# Patient Record
Sex: Male | Born: 1945 | Race: White | Hispanic: No | Marital: Married | Smoking: Former smoker
Health system: Southern US, Community
[De-identification: ages and names within clinical notes are randomized; demographics above are authoritative.]

## PROBLEM LIST (undated history)

## (undated) DIAGNOSIS — H9190 Unspecified hearing loss, unspecified ear: Secondary | ICD-10-CM

## (undated) DIAGNOSIS — M1990 Unspecified osteoarthritis, unspecified site: Secondary | ICD-10-CM

## (undated) DIAGNOSIS — I10 Essential (primary) hypertension: Secondary | ICD-10-CM

## (undated) DIAGNOSIS — E7800 Pure hypercholesterolemia, unspecified: Secondary | ICD-10-CM

## (undated) DIAGNOSIS — J449 Chronic obstructive pulmonary disease, unspecified: Secondary | ICD-10-CM

## (undated) DIAGNOSIS — I25110 Atherosclerotic heart disease of native coronary artery with unstable angina pectoris: Secondary | ICD-10-CM

## (undated) DIAGNOSIS — I4819 Other persistent atrial fibrillation: Secondary | ICD-10-CM

## (undated) DIAGNOSIS — I495 Sick sinus syndrome: Secondary | ICD-10-CM

## (undated) DIAGNOSIS — S42309A Unspecified fracture of shaft of humerus, unspecified arm, initial encounter for closed fracture: Secondary | ICD-10-CM

## (undated) DIAGNOSIS — I509 Heart failure, unspecified: Secondary | ICD-10-CM

## (undated) DIAGNOSIS — I2510 Atherosclerotic heart disease of native coronary artery without angina pectoris: Secondary | ICD-10-CM

## (undated) HISTORY — PX: TONSILLECTOMY: SUR1361

## (undated) HISTORY — DX: Other persistent atrial fibrillation: I48.19

## (undated) HISTORY — DX: Essential (primary) hypertension: I10

## (undated) HISTORY — DX: Chronic obstructive pulmonary disease, unspecified: J44.9

## (undated) HISTORY — PX: SKIN GRAFT: SHX250

## (undated) HISTORY — PX: FRACTURE SURGERY: SHX138

---

## 2013-03-14 ENCOUNTER — Encounter (HOSPITAL_COMMUNITY): Payer: Self-pay | Admitting: Emergency Medicine

## 2013-03-14 ENCOUNTER — Emergency Department (HOSPITAL_COMMUNITY): Payer: Medicare Other

## 2013-03-14 ENCOUNTER — Inpatient Hospital Stay (HOSPITAL_COMMUNITY)
Admission: EM | Admit: 2013-03-14 | Discharge: 2013-03-18 | DRG: 481 | Disposition: A | Discharge status: Skilled Nursing Facility | Payer: Medicare Other | Attending: Family Medicine | Admitting: Family Medicine

## 2013-03-14 DIAGNOSIS — S72143A Displaced intertrochanteric fracture of unspecified femur, initial encounter for closed fracture: Secondary | ICD-10-CM | POA: Insufficient documentation

## 2013-03-14 DIAGNOSIS — E7800 Pure hypercholesterolemia, unspecified: Secondary | ICD-10-CM | POA: Diagnosis present

## 2013-03-14 DIAGNOSIS — D62 Acute posthemorrhagic anemia: Secondary | ICD-10-CM

## 2013-03-14 DIAGNOSIS — Y990 Civilian activity done for income or pay: Secondary | ICD-10-CM

## 2013-03-14 DIAGNOSIS — W108XXA Fall (on) (from) other stairs and steps, initial encounter: Secondary | ICD-10-CM | POA: Diagnosis present

## 2013-03-14 DIAGNOSIS — Z87891 Personal history of nicotine dependence: Secondary | ICD-10-CM

## 2013-03-14 DIAGNOSIS — S72141A Displaced intertrochanteric fracture of right femur, initial encounter for closed fracture: Secondary | ICD-10-CM

## 2013-03-14 DIAGNOSIS — R9431 Abnormal electrocardiogram [ECG] [EKG]: Secondary | ICD-10-CM | POA: Diagnosis present

## 2013-03-14 DIAGNOSIS — Y9289 Other specified places as the place of occurrence of the external cause: Secondary | ICD-10-CM

## 2013-03-14 HISTORY — DX: Pure hypercholesterolemia, unspecified: E78.00

## 2013-03-14 HISTORY — DX: Unspecified fracture of shaft of humerus, unspecified arm, initial encounter for closed fracture: S42.309A

## 2013-03-14 LAB — CBC WITH DIFFERENTIAL/PLATELET
Basophils Absolute: 0 10*3/uL (ref 0.0–0.1)
Lymphocytes Relative: 7 % — ABNORMAL LOW (ref 12–46)
Lymphs Abs: 1.1 10*3/uL (ref 0.7–4.0)
MCV: 88.6 fL (ref 78.0–100.0)
Monocytes Relative: 7 % (ref 3–12)
Platelets: 273 10*3/uL (ref 150–400)
RDW: 13.3 % (ref 11.5–15.5)
WBC: 16.1 10*3/uL — ABNORMAL HIGH (ref 4.0–10.5)

## 2013-03-14 LAB — COMPREHENSIVE METABOLIC PANEL
BUN: 15 mg/dL (ref 6–23)
Calcium: 8.8 mg/dL (ref 8.4–10.5)
Creatinine, Ser: 1.23 mg/dL (ref 0.50–1.35)
GFR calc Af Amer: 68 mL/min — ABNORMAL LOW (ref >90)
Glucose, Bld: 117 mg/dL — ABNORMAL HIGH (ref 70–99)
Sodium: 139 mEq/L (ref 135–145)
Total Protein: 6.2 g/dL (ref 6.0–8.3)

## 2013-03-14 LAB — URINALYSIS, ROUTINE W REFLEX MICROSCOPIC
Ketones, ur: NEGATIVE mg/dL
Protein, ur: >300 mg/dL — AB
Urobilinogen, UA: 0.2 mg/dL (ref 0.0–1.0)

## 2013-03-14 LAB — URINE MICROSCOPIC-ADD ON

## 2013-03-14 LAB — ABO/RH: ABO/RH(D): B POS

## 2013-03-14 LAB — SURGICAL PCR SCREEN: Staphylococcus aureus: NEGATIVE

## 2013-03-14 MED ORDER — CEFAZOLIN SODIUM-DEXTROSE 2-3 GM-% IV SOLR
2.0000 g | Freq: Once | INTRAVENOUS | Status: AC
  Administered 2013-03-15: 2 g via INTRAVENOUS
  Filled 2013-03-14: qty 50

## 2013-03-14 MED ORDER — HYDROMORPHONE HCL PF 1 MG/ML IJ SOLN
1.0000 mg | Freq: Once | INTRAMUSCULAR | Status: AC
  Administered 2013-03-14: 1 mg via INTRAVENOUS
  Filled 2013-03-14: qty 1

## 2013-03-14 MED ORDER — SODIUM CHLORIDE 0.9 % IV BOLUS (SEPSIS)
250.0000 mL | Freq: Once | INTRAVENOUS | Status: AC
  Administered 2013-03-14: 250 mL via INTRAVENOUS

## 2013-03-14 MED ORDER — HYDROMORPHONE HCL PF 1 MG/ML IJ SOLN: 1.0000 mg | INTRAMUSCULAR | Status: DC | PRN

## 2013-03-14 MED ORDER — ATORVASTATIN CALCIUM 10 MG PO TABS
20.0000 mg | ORAL_TABLET | Freq: Every day | ORAL | Status: DC
  Administered 2013-03-14 – 2013-03-17 (×4): 20 mg via ORAL
  Filled 2013-03-14 (×4): qty 2

## 2013-03-14 MED ORDER — FENTANYL CITRATE 0.05 MG/ML IJ SOLN
50.0000 ug | Freq: Once | INTRAMUSCULAR | Status: AC
  Administered 2013-03-14: 50 ug via INTRAVENOUS
  Filled 2013-03-14: qty 2

## 2013-03-14 MED ORDER — ONDANSETRON HCL 4 MG/2ML IJ SOLN
4.0000 mg | Freq: Once | INTRAMUSCULAR | Status: AC
  Administered 2013-03-14: 4 mg via INTRAVENOUS
  Filled 2013-03-14: qty 2

## 2013-03-14 MED ORDER — ONDANSETRON HCL 4 MG/2ML IJ SOLN: 4.0000 mg | Freq: Three times a day (TID) | INTRAMUSCULAR | Status: DC | PRN

## 2013-03-14 MED ORDER — SODIUM CHLORIDE 0.9 % IV SOLN
INTRAVENOUS | Status: DC
  Administered 2013-03-14: 16:00:00 via INTRAVENOUS

## 2013-03-14 MED ORDER — FENTANYL CITRATE 0.05 MG/ML IJ SOLN
50.0000 ug | Freq: Once | INTRAMUSCULAR | Status: AC
  Administered 2013-03-14: 50 ug via INTRAVENOUS
  Filled 2013-03-14 (×2): qty 2

## 2013-03-14 MED ORDER — POVIDONE-IODINE 10 % EX SOLN
Freq: Once | CUTANEOUS | Status: AC
  Administered 2013-03-14: 1 via TOPICAL
  Filled 2013-03-14: qty 118

## 2013-03-14 MED ORDER — SODIUM CHLORIDE 0.9 % IV SOLN: INTRAVENOUS | Status: DC

## 2013-03-14 MED ORDER — MORPHINE SULFATE 2 MG/ML IJ SOLN
2.0000 mg | INTRAMUSCULAR | Status: DC | PRN
  Administered 2013-03-14 – 2013-03-15 (×6): 2 mg via INTRAVENOUS
  Filled 2013-03-14 (×6): qty 1

## 2013-03-14 MED ORDER — ACETAMINOPHEN 325 MG PO TABS: 650.0000 mg | ORAL_TABLET | Freq: Four times a day (QID) | ORAL | Status: DC | PRN

## 2013-03-14 MED ORDER — HYDROCODONE-ACETAMINOPHEN 5-325 MG PO TABS
1.0000 | ORAL_TABLET | Freq: Four times a day (QID) | ORAL | Status: DC | PRN
  Administered 2013-03-14 – 2013-03-18 (×8): 2 via ORAL
  Filled 2013-03-14 (×8): qty 2

## 2013-03-14 NOTE — ED Provider Notes (Addendum)
History     CSN: [id number]  Arrival date & time 03/14/13  1338   First MD Initiated Contact with Patient 03/14/13 1410      Chief Complaint  Patient presents with  . Fall  . Hip Pain    (Consider location/radiation/quality/duration/timing/severity/associated sxs/prior treatment) Patient is a 67 y.o. male presenting with fall and hip pain. The history is provided by the patient and the spouse.  Fall Pertinent negatives include no fever, no abdominal pain, no nausea, no vomiting, no hematuria and no headaches.  Hip Pain Pertinent negatives include no chest pain, no abdominal pain, no headaches and no shortness of breath.   patient stumbled the stepping off some stairs missing the final step falling about 3 feet onto his right hip. Resulting in significant hip pain. Pain is 10 out of 10 made worse with any movement of the right leg. No other injuries denies his head does not complaining of neck or back pain. Left leg is fine. No syncope no chest pain.  Past Medical History  Diagnosis Date  . High cholesterol   . Broken arm     Past Surgical History  Procedure Laterality Date  . Tonsillectomy    . Skin graft      No family history on file.  History  Substance Use Topics  . Smoking status: Former Games developer  . Smokeless tobacco: Not on file  . Alcohol Use: Yes     Comment: occasional       Review of Systems  Constitutional: Negative for fever and chills.  HENT: Negative for neck pain.   Respiratory: Negative for cough and shortness of breath.   Cardiovascular: Negative for chest pain.  Gastrointestinal: Negative for nausea, vomiting and abdominal pain.  Genitourinary: Negative for hematuria.  Musculoskeletal: Negative for back pain.  Neurological: Negative for headaches.  Hematological: Does not bruise/bleed easily.  Psychiatric/Behavioral: Negative for confusion.    Allergies  Review of patient's allergies indicates no known allergies.  Home Medications    Current Outpatient Rx  Name  Route  Sig  Dispense  Refill  . atorvastatin (LIPITOR) 20 MG tablet   Oral   Take 20 mg by mouth daily.           BP 172/92  Pulse 73  Temp(Src) 98.3 F (36.8 C) (Oral)  Resp 20  Ht 5\' 8"  (1.727 m)  Wt 165 lb (74.844 kg)  BMI 25.09 kg/m2  SpO2 97%  Physical Exam  Nursing note and vitals reviewed. Constitutional: He is oriented to person, place, and time. He appears well-developed and well-nourished. No distress.  HENT:  Head: Normocephalic and atraumatic.  Mouth/Throat: Oropharynx is clear and moist.  Eyes: Conjunctivae and EOM are normal. Pupils are equal, round, and reactive to light.  Neck: Normal range of motion. Neck supple.  Cardiovascular: Normal rate, regular rhythm, normal heart sounds and intact distal pulses.   No murmur heard. Pulmonary/Chest: Effort normal and breath sounds normal.  Abdominal: Bowel sounds are normal. There is no tenderness.  Musculoskeletal: Normal range of motion. He exhibits tenderness. He exhibits no edema.  Tender around the right hip with deformity. Patient's cap refill to his right foot is 2 seconds. Pulses are intact. Patient has range of motion of toes. The foot is rotated laterally.  Neurological: He is alert and oriented to person, place, and time. No cranial nerve deficit. He exhibits normal muscle tone.  Skin: Skin is warm. No rash noted.    ED Course  Procedures (including critical  care time)  Labs Reviewed  CBC WITH DIFFERENTIAL - Abnormal; Notable for the following:    WBC 16.1 (*)    Neutrophils Relative % 86 (*)    Lymphocytes Relative 7 (*)    Neutro Abs 13.9 (*)    Monocytes Absolute 1.1 (*)    All other components within normal limits  URINALYSIS, ROUTINE W REFLEX MICROSCOPIC - Abnormal; Notable for the following:    Specific Gravity, Urine >1.030 (*)    Hgb urine dipstick MODERATE (*)    Protein, ur >300 (*)    All other components within normal limits  COMPREHENSIVE METABOLIC  PANEL - Abnormal; Notable for the following:    Glucose, Bld 117 (*)    GFR calc non Af Amer 59 (*)    GFR calc Af Amer 68 (*)    All other components within normal limits  URINE MICROSCOPIC-ADD ON - Abnormal; Notable for the following:    Squamous Epithelial / LPF FEW (*)    Bacteria, UA FEW (*)    Casts GRANULAR CAST (*)    All other components within normal limits   Dg Chest 1 View  03/14/2013   *RADIOLOGY REPORT*  Clinical Data: Fall.  Hip fracture  CHEST - 1 VIEW  Comparison: None.  Findings: The lungs are clear.  Negative for heart failure or pneumonia.  Negative for mass lesion.  No pleural effusion.  IMPRESSION: No acute abnormality.   Original Report Authenticated By: Janeece Riggers, M.D.   Dg Hip Complete Right  03/14/2013   *RADIOLOGY REPORT*  Clinical Data: Fall  RIGHT HIP - COMPLETE 2+ VIEW  Comparison: None.  Findings: Intertrochanteric fracture right femur with moderate angulation and comminution.  Right hip joint appears normal.  No other pelvic fractures.  There is arterial calcification in the femoral artery.  IMPRESSION: Angulated intertrochanteric fracture on the right.   Original Report Authenticated By: Janeece Riggers, M.D.   Results for orders placed during the hospital encounter of 03/14/13  CBC WITH DIFFERENTIAL      Result Value Range   WBC 16.1 (*) 4.0 - 10.5 K/uL   RBC 4.65  4.22 - 5.81 MIL/uL   Hemoglobin 14.6  13.0 - 17.0 g/dL   HCT 16.1  09.6 - 04.5 %   MCV 88.6  78.0 - 100.0 fL   MCH 31.4  26.0 - 34.0 pg   MCHC 35.4  30.0 - 36.0 g/dL   RDW 40.9  81.1 - 91.4 %   Platelets 273  150 - 400 K/uL   Neutrophils Relative % 86 (*) 43 - 77 %   Lymphocytes Relative 7 (*) 12 - 46 %   Monocytes Relative 7  3 - 12 %   Eosinophils Relative 0  0 - 5 %   Basophils Relative 0  0 - 1 %   Neutro Abs 13.9 (*) 1.7 - 7.7 K/uL   Lymphs Abs 1.1  0.7 - 4.0 K/uL   Monocytes Absolute 1.1 (*) 0.1 - 1.0 K/uL   Eosinophils Absolute 0.0  0.0 - 0.7 K/uL   Basophils Absolute 0.0  0.0 -  0.1 K/uL   WBC Morphology WHITE COUNT CONFIRMED ON SMEAR     Smear Review LARGE PLATELETS PRESENT    URINALYSIS, ROUTINE W REFLEX MICROSCOPIC      Result Value Range   Color, Urine YELLOW  YELLOW   APPearance CLEAR  CLEAR   Specific Gravity, Urine >1.030 (*) 1.005 - 1.030   pH 5.5  5.0 - 8.0  Glucose, UA NEGATIVE  NEGATIVE mg/dL   Hgb urine dipstick MODERATE (*) NEGATIVE   Bilirubin Urine NEGATIVE  NEGATIVE   Ketones, ur NEGATIVE  NEGATIVE mg/dL   Protein, ur >811 (*) NEGATIVE mg/dL   Urobilinogen, UA 0.2  0.0 - 1.0 mg/dL   Nitrite NEGATIVE  NEGATIVE   Leukocytes, UA NEGATIVE  NEGATIVE  COMPREHENSIVE METABOLIC PANEL      Result Value Range   Sodium 139  135 - 145 mEq/L   Potassium 3.7  3.5 - 5.1 mEq/L   Chloride 103  96 - 112 mEq/L   CO2 24  19 - 32 mEq/L   Glucose, Bld 117 (*) 70 - 99 mg/dL   BUN 15  6 - 23 mg/dL   Creatinine, Ser 9.14  0.50 - 1.35 mg/dL   Calcium 8.8  8.4 - 78.2 mg/dL   Total Protein 6.2  6.0 - 8.3 g/dL   Albumin 3.6  3.5 - 5.2 g/dL   AST 23  0 - 37 U/L   ALT 24  0 - 53 U/L   Alkaline Phosphatase 78  39 - 117 U/L   Total Bilirubin 0.7  0.3 - 1.2 mg/dL   GFR calc non Af Amer 59 (*) >90 mL/min   GFR calc Af Amer 68 (*) >90 mL/min  URINE MICROSCOPIC-ADD ON      Result Value Range   Squamous Epithelial / LPF FEW (*) RARE   WBC, UA 0-2  <3 WBC/hpf   RBC / HPF 7-10  <3 RBC/hpf   Bacteria, UA FEW (*) RARE   Casts GRANULAR CAST (*) NEGATIVE    Date: 03/14/2013  Rate: 70  Rhythm: normal sinus rhythm and premature atrial contractions (PAC)  QRS Axis: normal  Intervals: normal  ST/T Wave abnormalities: nonspecific T wave changes  Conduction Disutrbances:none  Narrative Interpretation:   Old EKG Reviewed: none available Today's EKG depicts some T-wave inversions inferiorly and laterally no significant ST-T segment changes. No old EKG for comparison.  1. Intertrochanteric fracture of right femur, closed, initial encounter       MDM    Patient with  closed right intertrochanteric hip fracture no other significant medical problems other than elevated cholesterol. Today's EKG does have some T-wave inversions out laterally and inferiorly but no evidence of any acute cardiac event. Patient is not having any chest pain. Discussed with [facility] they will admit orthopedic call to Dr. Hilda Lias is still pending. He will be the operating surgeon. Patient tripped and fell no syncopal event involved. No other injuries.        Shelda Jakes, MD 03/14/13 1707  Shelda Jakes, MD 03/14/13 [phone]

## 2013-03-14 NOTE — H&P (Signed)
History and Physical  Ricardo Garrett ZOX:[id number] DOB: Nov 11, 1945 DOA: 03/14/2013  Referring physician: Darlyn Chamber, MD PCP: Catalina Pizza, MD   Chief Complaint: fall  HPI:  67 year old man presented emergency department after a fall resulting in right hip pain. X-ray revealed right hip fracture.  History obtained from patient. He is very active and continues to work. 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 He had pain afterwards.  In the emergency department he was noted to be afebrile with stable vital signs. Screening laboratory workup was unremarkable. EKG was nondiagnostic. Medical admission was requested.  Review of Systems:  Negative for fever, visual changes, sore throat, rash, chest pain, SOB, dysuria, bleeding, n/v/abdominal pain.  Past Medical History  Diagnosis Date  . High cholesterol   . Broken arm     Past Surgical History  Procedure Laterality Date  . Tonsillectomy    . Skin graft      Social History:  reports that he has quit smoking. He does not have any smokeless tobacco history on file. He reports that  drinks alcohol. He reports that he uses illicit drugs (Marijuana).  No Known Allergies  No family history on file. Patient does not report any particular medical problems in the family.  Prior to Admission medications   Medication Sig Start Date End Date Taking? Authorizing Provider  atorvastatin (LIPITOR) 20 MG tablet Take 20 mg by mouth daily.   Yes Historical Provider, MD   Physical Exam: Filed Vitals:   03/14/13 1349 03/14/13 1549 03/14/13 1709 03/14/13 1711  BP: 168/100 172/92 135/83 135/83  Pulse: 70 73 87 87  Temp: 98.3 F (36.8 C)     TempSrc: Oral     Resp: 20 20 20 20   Height: 5\' 8"  (1.727 m)     Weight: 74.844 kg (165 lb)     SpO2: 100% 97% 97% 97%    General:  Examined in the emergency department. Appears calm and comfortable Eyes: Pupils,  irises, lids appear normal. ENT: grossly normal hearing, lips & tongue. Poor dentition. Neck: no LAD, masses or thyromegaly Cardiovascular: RRR, no m/r/g. No LE edema. Respiratory: CTA bilaterally, no w/r/r. Normal respiratory effort. Abdomen: soft, ntnd Skin: no rash or induration seen  Musculoskeletal: grossly normal tone BUE/BLE. Right lower extremity shortened and externally rotated. Sensation right foot grossly intact. Psychiatric: grossly normal mood and affect, speech fluent and appropriate Neurologic: grossly non-focal.  Wt Readings from Last 3 Encounters:  03/14/13 74.844 kg (165 lb)    Labs on Admission:  Basic Metabolic Panel:  Recent Labs Lab 03/14/13 1540  NA 139  K 3.7  CL 103  CO2 24  GLUCOSE 117*  BUN 15  CREATININE 1.23  CALCIUM 8.8    Liver Function Tests:  Recent Labs Lab 03/14/13 1540  AST 23  ALT 24  ALKPHOS 78  BILITOT 0.7  PROT 6.2  ALBUMIN 3.6    CBC:  Recent Labs Lab 03/14/13 1540  WBC 16.1*  NEUTROABS 13.9*  HGB 14.6  HCT 41.2  MCV 88.6  PLT 273     Radiological Exams on Admission: Dg Chest 1 View  03/14/2013   *RADIOLOGY REPORT*  Clinical Data: Fall.  Hip fracture  CHEST - 1 VIEW  Comparison: None.  Findings: The lungs are clear.  Negative for heart failure or pneumonia.  Negative for mass lesion.  No pleural effusion.  IMPRESSION: No acute abnormality.  Original Report Authenticated By: Janeece Riggers, M.D.   Dg Hip Complete Right  03/14/2013   *RADIOLOGY REPORT*  Clinical Data: Fall  RIGHT HIP - COMPLETE 2+ VIEW  Comparison: None.  Findings: Intertrochanteric fracture right femur with moderate angulation and comminution.  Right hip joint appears normal.  No other pelvic fractures.  There is arterial calcification in the femoral artery.  IMPRESSION: Angulated intertrochanteric fracture on the right.   Original Report Authenticated By: Janeece Riggers, M.D.    EKG: Independently reviewed. Sinus rhythm. Inferolateral T wave  inversion, likely repolarization abnormality.   Principal Problem:   Intertrochanteric fracture of right hip   Assessment/Plan 1. Right hip fracture: Admit. Orthopedics consultation. Pain control. The patient has no history cardiac disease or signs of symptoms of ACS or angina. Clear for surgery without further evaluation. 2. Abnormal EKG: Likely repolarization abnormality. No further evaluation as above.  Code Status: Full code Family Communication: None present Disposition Plan/Anticipated LOS: Admit. 3 days.  Time spent: 50 minutes  Brendia Sacks, MD  [facility] Pager [phone] 03/14/2013, 5:59 PM

## 2013-03-14 NOTE — ED Notes (Signed)
Per patient he stepped off a set of stairs, missing the step falling approximately 3 feet onto his right hip. External rotation and shortening of right leg notable.

## 2013-03-14 NOTE — ED Notes (Signed)
Upon attempting to locate a old EKG - MUSE system indicates this medical record number is for a "Ricardo Garrett".  Aggie Cosier and I both verified the Medical Record number which was entered correctly.  Registration was notified.  Nurse was also notified.

## 2013-03-14 NOTE — Consult Note (Signed)
Reason for Consult:Fracture of the right hip Referring Physician: ER  Ricardo Garrett is an 67 y.o. male.  HPI: He was working on a staircase.  He stepped off a three foot or so drop and hurt his right hip.  He was unable to stand or get up.  X-rays show a comminuted intertrochanteric fracture of the right hip.  I have talked to him about surgery on the hip.  Risks and imponderables including infection, blood clot and embolus, need for possible blood transfusion, need for physical therapy, possible nursing home placement (he says he has people that can take care of him at home) and anesthesia risks discussed.  He asked appropriate questions and appeared to understand.  He agrees to the procedure.  Past Medical History  Diagnosis Date  . High cholesterol   . Broken arm     Past Surgical History  Procedure Laterality Date  . Tonsillectomy    . Skin graft      No family history on file.  Social History:  reports that he has quit smoking. He does not have any smokeless tobacco history on file. He reports that  drinks alcohol. He reports that he uses illicit drugs (Marijuana).  Allergies: No Known Allergies  Medications: I have reviewed the patient's current medications.  Results for orders placed during the hospital encounter of 03/14/13 (from the past 48 hour(s))  CBC WITH DIFFERENTIAL     Status: Abnormal   Collection Time    03/14/13  3:40 PM      Result Value Range   WBC 16.1 (*) 4.0 - 10.5 K/uL   RBC 4.65  4.22 - 5.81 MIL/uL   Hemoglobin 14.6  13.0 - 17.0 g/dL   HCT 16.1  09.6 - 04.5 %   MCV 88.6  78.0 - 100.0 fL   MCH 31.4  26.0 - 34.0 pg   MCHC 35.4  30.0 - 36.0 g/dL   RDW 40.9  81.1 - 91.4 %   Platelets 273  150 - 400 K/uL   Neutrophils Relative % 86 (*) 43 - 77 %   Lymphocytes Relative 7 (*) 12 - 46 %   Monocytes Relative 7  3 - 12 %   Eosinophils Relative 0  0 - 5 %   Basophils Relative 0  0 - 1 %   Neutro Abs 13.9 (*) 1.7 - 7.7 K/uL   Lymphs Abs 1.1  0.7 - 4.0 K/uL    Monocytes Absolute 1.1 (*) 0.1 - 1.0 K/uL   Eosinophils Absolute 0.0  0.0 - 0.7 K/uL   Basophils Absolute 0.0  0.0 - 0.1 K/uL   WBC Morphology WHITE COUNT CONFIRMED ON SMEAR     Comment: ATYPICAL LYMPHOCYTES     ATYPICAL MONONUCLEAR CELLS     INCREASED BANDS (>20% BANDS)   Smear Review LARGE PLATELETS PRESENT    COMPREHENSIVE METABOLIC PANEL     Status: Abnormal   Collection Time    03/14/13  3:40 PM      Result Value Range   Sodium 139  135 - 145 mEq/L   Potassium 3.7  3.5 - 5.1 mEq/L   Chloride 103  96 - 112 mEq/L   CO2 24  19 - 32 mEq/L   Glucose, Bld 117 (*) 70 - 99 mg/dL   BUN 15  6 - 23 mg/dL   Creatinine, Ser 7.82  0.50 - 1.35 mg/dL   Calcium 8.8  8.4 - 95.6 mg/dL   Total Protein 6.2  6.0 -  8.3 g/dL   Albumin 3.6  3.5 - 5.2 g/dL   AST 23  0 - 37 U/L   ALT 24  0 - 53 U/L   Alkaline Phosphatase 78  39 - 117 U/L   Total Bilirubin 0.7  0.3 - 1.2 mg/dL   GFR calc non Af Amer 59 (*) >90 mL/min   GFR calc Af Amer 68 (*) >90 mL/min   Comment:            The eGFR has been calculated     using the CKD EPI equation.     This calculation has not been     validated in all clinical     situations.     eGFR's persistently     <90 mL/min signify     possible Chronic Kidney Disease.  URINALYSIS, ROUTINE W REFLEX MICROSCOPIC     Status: Abnormal   Collection Time    03/14/13  3:52 PM      Result Value Range   Color, Urine YELLOW  YELLOW   APPearance CLEAR  CLEAR   Specific Gravity, Urine >1.030 (*) 1.005 - 1.030   pH 5.5  5.0 - 8.0   Glucose, UA NEGATIVE  NEGATIVE mg/dL   Hgb urine dipstick MODERATE (*) NEGATIVE   Bilirubin Urine NEGATIVE  NEGATIVE   Ketones, ur NEGATIVE  NEGATIVE mg/dL   Protein, ur >454 (*) NEGATIVE mg/dL   Urobilinogen, UA 0.2  0.0 - 1.0 mg/dL   Nitrite NEGATIVE  NEGATIVE   Leukocytes, UA NEGATIVE  NEGATIVE  URINE MICROSCOPIC-ADD ON     Status: Abnormal   Collection Time    03/14/13  3:52 PM      Result Value Range   Squamous Epithelial / LPF FEW  (*) RARE   WBC, UA 0-2  <3 WBC/hpf   RBC / HPF 7-10  <3 RBC/hpf   Bacteria, UA FEW (*) RARE   Casts GRANULAR CAST (*) NEGATIVE    Dg Chest 1 View  03/14/2013   *RADIOLOGY REPORT*  Clinical Data: Fall.  Hip fracture  CHEST - 1 VIEW  Comparison: None.  Findings: The lungs are clear.  Negative for heart failure or pneumonia.  Negative for mass lesion.  No pleural effusion.  IMPRESSION: No acute abnormality.   Original Report Authenticated By: Janeece Riggers, M.D.   Dg Hip Complete Right  03/14/2013   *RADIOLOGY REPORT*  Clinical Data: Fall  RIGHT HIP - COMPLETE 2+ VIEW  Comparison: None.  Findings: Intertrochanteric fracture right femur with moderate angulation and comminution.  Right hip joint appears normal.  No other pelvic fractures.  There is arterial calcification in the femoral artery.  IMPRESSION: Angulated intertrochanteric fracture on the right.   Original Report Authenticated By: Janeece Riggers, M.D.    Review of Systems  Musculoskeletal: Positive for falls (today and hurt right hip).  All other systems reviewed and are negative.   Blood pressure 165/76, pulse 74, temperature 98.5 F (36.9 C), temperature source Oral, resp. rate 20, height 5\' 8"  (1.727 m), weight 81.8 kg (180 lb 5.4 oz), SpO2 95.00%. Physical Exam  Constitutional: He is oriented to person, place, and time. He appears well-developed.  HENT:  Head: Normocephalic and atraumatic.  Eyes: Conjunctivae and EOM are normal. Pupils are equal, round, and reactive to light.  Neck: Normal range of motion. Neck supple.  Cardiovascular: Normal rate, regular rhythm, normal heart sounds and intact distal pulses.   Respiratory: Effort normal and breath sounds normal.  GI: Soft. Bowel  sounds are normal.  Musculoskeletal: He exhibits tenderness (pain with any motion of the right hip).  Neurological: He is alert and oriented to person, place, and time. He has normal reflexes.  Skin: Skin is warm.  Psychiatric: He has a normal mood and  affect. His behavior is normal. Judgment and thought content normal.    Assessment/Plan: Comminuted right hip intertrochanteric fracture.  For open fixation and internal treatment tomorrow.    Ricardo Garrett 03/14/2013, 6:51 PM

## 2013-03-15 ENCOUNTER — Encounter (HOSPITAL_COMMUNITY): Payer: Self-pay | Admitting: Anesthesiology

## 2013-03-15 ENCOUNTER — Inpatient Hospital Stay (HOSPITAL_COMMUNITY): Payer: Medicare Other

## 2013-03-15 ENCOUNTER — Encounter (HOSPITAL_COMMUNITY): Payer: Self-pay | Admitting: *Deleted

## 2013-03-15 ENCOUNTER — Encounter (HOSPITAL_COMMUNITY)
Admission: EM | Disposition: A | Discharge status: Skilled Nursing Facility | Payer: Self-pay | Source: Home / Self Care | Attending: Family Medicine

## 2013-03-15 ENCOUNTER — Inpatient Hospital Stay (HOSPITAL_COMMUNITY): Payer: Medicare Other | Admitting: Anesthesiology

## 2013-03-15 HISTORY — PX: COMPRESSION HIP SCREW: SHX1386

## 2013-03-15 SURGERY — COMPRESSION HIP
Anesthesia: Spinal | Site: Hip | Laterality: Right | Wound class: Clean

## 2013-03-15 MED ORDER — FENTANYL CITRATE 0.05 MG/ML IJ SOLN
INTRAMUSCULAR | Status: AC
  Filled 2013-03-15: qty 2

## 2013-03-15 MED ORDER — SODIUM CHLORIDE 0.9 % IR SOLN
Status: DC | PRN
  Administered 2013-03-15: 1000 mL

## 2013-03-15 MED ORDER — LIDOCAINE HCL (CARDIAC) 10 MG/ML IV SOLN
INTRAVENOUS | Status: DC | PRN
  Administered 2013-03-15: 10 mg via INTRAVENOUS

## 2013-03-15 MED ORDER — ALBUTEROL SULFATE (5 MG/ML) 0.5% IN NEBU
2.5000 mg | INHALATION_SOLUTION | Freq: Four times a day (QID) | RESPIRATORY_TRACT | Status: DC
  Administered 2013-03-15 – 2013-03-18 (×9): 2.5 mg via RESPIRATORY_TRACT
  Filled 2013-03-15 (×11): qty 0.5

## 2013-03-15 MED ORDER — BUPIVACAINE IN DEXTROSE 0.75-8.25 % IT SOLN
INTRATHECAL | Status: AC
  Filled 2013-03-15: qty 2

## 2013-03-15 MED ORDER — EPHEDRINE SULFATE 50 MG/ML IJ SOLN
INTRAMUSCULAR | Status: DC | PRN
  Administered 2013-03-15 (×3): 5 mg via INTRAVENOUS
  Administered 2013-03-15: 10 mg via INTRAVENOUS

## 2013-03-15 MED ORDER — PROPOFOL 10 MG/ML IV BOLUS
INTRAVENOUS | Status: DC | PRN
  Administered 2013-03-15 (×2): 10 mg via INTRAVENOUS

## 2013-03-15 MED ORDER — FENTANYL CITRATE 0.05 MG/ML IJ SOLN
INTRAMUSCULAR | Status: DC | PRN
  Administered 2013-03-15: 12.5 ug via INTRAVENOUS

## 2013-03-15 MED ORDER — ONDANSETRON HCL 4 MG/2ML IJ SOLN: 4.0000 mg | Freq: Four times a day (QID) | INTRAMUSCULAR | Status: DC | PRN

## 2013-03-15 MED ORDER — EPHEDRINE SULFATE 50 MG/ML IJ SOLN
INTRAMUSCULAR | Status: AC
  Filled 2013-03-15: qty 1

## 2013-03-15 MED ORDER — HYDROMORPHONE HCL PF 1 MG/ML IJ SOLN
1.0000 mg | INTRAMUSCULAR | Status: DC | PRN
  Administered 2013-03-16 (×5): 1 mg via INTRAVENOUS
  Filled 2013-03-15 (×5): qty 1

## 2013-03-15 MED ORDER — PROPOFOL INFUSION 10 MG/ML OPTIME
INTRAVENOUS | Status: DC | PRN
  Administered 2013-03-15 (×2): 25 ug/kg/min via INTRAVENOUS

## 2013-03-15 MED ORDER — MIDAZOLAM HCL 2 MG/2ML IJ SOLN
INTRAMUSCULAR | Status: AC
  Filled 2013-03-15: qty 2

## 2013-03-15 MED ORDER — FENTANYL CITRATE 0.05 MG/ML IJ SOLN
INTRAMUSCULAR | Status: DC | PRN
  Administered 2013-03-15 (×7): 12.5 ug via INTRAVENOUS

## 2013-03-15 MED ORDER — LACTATED RINGERS IV SOLN
INTRAVENOUS | Status: DC
  Administered 2013-03-15: 13:00:00 via INTRAVENOUS

## 2013-03-15 MED ORDER — FENTANYL CITRATE 0.05 MG/ML IJ SOLN: 25.0000 ug | INTRAMUSCULAR | Status: DC | PRN

## 2013-03-15 MED ORDER — PROPOFOL 10 MG/ML IV EMUL
INTRAVENOUS | Status: AC
  Filled 2013-03-15: qty 20

## 2013-03-15 MED ORDER — HYDROGEN PEROXIDE 3 % EX SOLN
CUTANEOUS | Status: DC | PRN
  Administered 2013-03-15: 1

## 2013-03-15 MED ORDER — BUPIVACAINE IN DEXTROSE 0.75-8.25 % IT SOLN
INTRATHECAL | Status: DC | PRN
  Administered 2013-03-15: 15 mg via INTRATHECAL

## 2013-03-15 MED ORDER — ACETAMINOPHEN 10 MG/ML IV SOLN
INTRAVENOUS | Status: AC
  Filled 2013-03-15: qty 200

## 2013-03-15 MED ORDER — MIDAZOLAM HCL 2 MG/2ML IJ SOLN
1.0000 mg | INTRAMUSCULAR | Status: DC | PRN
  Administered 2013-03-15: 2 mg via INTRAVENOUS

## 2013-03-15 MED ORDER — ACETAMINOPHEN 10 MG/ML IV SOLN
1000.0000 mg | Freq: Four times a day (QID) | INTRAVENOUS | Status: AC
  Administered 2013-03-15 – 2013-03-16 (×4): 1000 mg via INTRAVENOUS
  Filled 2013-03-15 (×4): qty 100

## 2013-03-15 MED ORDER — CEFAZOLIN SODIUM-DEXTROSE 2-3 GM-% IV SOLR
INTRAVENOUS | Status: AC
  Filled 2013-03-15: qty 50

## 2013-03-15 MED ORDER — PROMETHAZINE HCL 25 MG/ML IJ SOLN: 12.5000 mg | INTRAMUSCULAR | Status: DC | PRN

## 2013-03-15 MED ORDER — MAGNESIUM HYDROXIDE 400 MG/5ML PO SUSP: 30.0000 mL | Freq: Every day | ORAL | Status: DC | PRN

## 2013-03-15 MED ORDER — ENOXAPARIN SODIUM 40 MG/0.4ML ~~LOC~~ SOLN
40.0000 mg | SUBCUTANEOUS | Status: DC
  Administered 2013-03-16 – 2013-03-18 (×3): 40 mg via SUBCUTANEOUS
  Filled 2013-03-15 (×3): qty 0.4

## 2013-03-15 MED ORDER — ONDANSETRON HCL 4 MG/2ML IJ SOLN: 4.0000 mg | Freq: Once | INTRAMUSCULAR | Status: DC | PRN

## 2013-03-15 SURGICAL SUPPLY — 60 items
BAG HAMPER (MISCELLANEOUS) ×2 IMPLANT
BIT DRILL 6.5 DISP STRL (BIT) IMPLANT
BIT DRILL TWIST 3.5MM (BIT) ×1 IMPLANT
BLADE SURG SZ10 CARB STEEL (BLADE) ×4 IMPLANT
BLADE SURG SZ20 CARB STEEL (BLADE) ×2 IMPLANT
CLEANER TIP ELECTROSURG 2X2 (MISCELLANEOUS) ×2 IMPLANT
CLOTH BEACON ORANGE TIMEOUT ST (SAFETY) ×2 IMPLANT
COVER LIGHT HANDLE STERIS (MISCELLANEOUS) ×4 IMPLANT
COVER MAYO STAND XLG (DRAPE) ×2 IMPLANT
DRAPE STERI IOBAN 125X83 (DRAPES) ×2 IMPLANT
DRILL OMEGA BONE (BIT) IMPLANT
DRILL TWIST 3.5MM (BIT) ×2
ELECT REM PT RETURN 9FT ADLT (ELECTROSURGICAL) ×2
ELECTRODE REM PT RTRN 9FT ADLT (ELECTROSURGICAL) ×1 IMPLANT
EVACUATOR 3/16  PVC DRAIN (DRAIN) ×1
EVACUATOR 3/16 PVC DRAIN (DRAIN) ×1 IMPLANT
GAUZE XEROFORM 5X9 LF (GAUZE/BANDAGES/DRESSINGS) ×2 IMPLANT
GLOVE BIO SURGEON STRL SZ8 (GLOVE) ×2 IMPLANT
GLOVE BIO SURGEON STRL SZ8.5 (GLOVE) ×2 IMPLANT
GLOVE BIOGEL PI IND STRL 7.0 (GLOVE) ×2 IMPLANT
GLOVE BIOGEL PI IND STRL 8 (GLOVE) ×1 IMPLANT
GLOVE BIOGEL PI INDICATOR 7.0 (GLOVE) ×2
GLOVE BIOGEL PI INDICATOR 8 (GLOVE) ×1
GLOVE EXAM NITRILE MD LF STRL (GLOVE) ×2 IMPLANT
GLOVE SKINSENSE NS SZ8.0 LF (GLOVE) ×1
GLOVE SKINSENSE STRL SZ8.0 LF (GLOVE) ×1 IMPLANT
GLOVE SS BIOGEL STRL SZ 6.5 (GLOVE) ×2 IMPLANT
GLOVE SUPERSENSE BIOGEL SZ 6.5 (GLOVE) ×2
GOWN STRL REIN XL XLG (GOWN DISPOSABLE) ×8 IMPLANT
GUIDE PIN CALIBRATED (PIN) ×4 IMPLANT
INST SET MAJOR BONE (KITS) ×2 IMPLANT
KIT BLADEGUARD II DBL (SET/KITS/TRAYS/PACK) ×2 IMPLANT
KIT ROOM TURNOVER AP CYSTO (KITS) ×2 IMPLANT
MANIFOLD NEPTUNE II (INSTRUMENTS) ×2 IMPLANT
MARKER SKIN DUAL TIP RULER LAB (MISCELLANEOUS) ×2 IMPLANT
NS IRRIG 1000ML POUR BTL (IV SOLUTION) ×2 IMPLANT
PACK BASIC III (CUSTOM PROCEDURE TRAY) ×1
PACK SRG BSC III STRL LF ECLPS (CUSTOM PROCEDURE TRAY) ×1 IMPLANT
PAD ABD 5X9 TENDERSORB (GAUZE/BANDAGES/DRESSINGS) ×4 IMPLANT
PAD ARMBOARD 7.5X6 YLW CONV (MISCELLANEOUS) ×2 IMPLANT
PENCIL HANDSWITCHING (ELECTRODE) ×2 IMPLANT
PLATE SHORT BARRELL 150X4 (Plate) ×2 IMPLANT
SCREW CORTICAL SFTP 4.5X36MM (Screw) ×2 IMPLANT
SCREW CORTICAL SFTP 4.5X38MM (Screw) ×4 IMPLANT
SCREW CORTICAL SFTP 4.5X52MM (Screw) ×2 IMPLANT
SCREW LAG 90MM (Screw) ×1 IMPLANT
SCREW LAGSTD 90X21X12.7X9 (Screw) ×1 IMPLANT
SET BASIN LINEN APH (SET/KITS/TRAYS/PACK) ×2 IMPLANT
SLEEVE BEADED CABLE (Orthopedic Implant) ×2 IMPLANT
SPONGE DRAIN TRACH 4X4 STRL 2S (GAUZE/BANDAGES/DRESSINGS) ×2 IMPLANT
SPONGE GAUZE 4X4 12PLY (GAUZE/BANDAGES/DRESSINGS) ×2 IMPLANT
SPONGE LAP 18X18 X RAY DECT (DISPOSABLE) ×4 IMPLANT
STAPLER VISISTAT 35W (STAPLE) ×2 IMPLANT
SUT BRALON NAB BRD #1 30IN (SUTURE) ×4 IMPLANT
SUT PLAIN 2 0 XLH (SUTURE) ×4 IMPLANT
SUT SILK 0 FSL (SUTURE) ×2 IMPLANT
SYR BULB IRRIGATION 50ML (SYRINGE) ×2 IMPLANT
TAPE MEDIFIX FOAM 3 (GAUZE/BANDAGES/DRESSINGS) ×2 IMPLANT
YANKAUER SUCT 12FT TUBE ARGYLE (SUCTIONS) ×2 IMPLANT
YANKAUER SUCT BULB TIP 10FT TU (MISCELLANEOUS) ×2 IMPLANT

## 2013-03-15 NOTE — Anesthesia Procedure Notes (Signed)
Procedure Name: MAC Date/Time: 03/15/2013 1:29 PM Performed by: Franco Nones Pre-anesthesia Checklist: Patient identified, Emergency Drugs available, Suction available, Timeout performed and Patient being monitored Patient Re-evaluated:Patient Re-evaluated prior to inductionOxygen Delivery Method: Nasal Cannula    Procedure Name: MAC Date/Time: 03/15/2013 1:40 PM Performed by: Franco Nones Pre-anesthesia Checklist: Patient identified, Emergency Drugs available, Suction available, Timeout performed and Patient being monitored Patient Re-evaluated:Patient Re-evaluated prior to inductionOxygen Delivery Method: Non-rebreather mask    Spinal  Patient location during procedure: OR Start time: 03/15/2013 1:40 PM End time: 03/15/2013 1:47 PM Staffing CRNA/Resident: Minerva Areola S Preanesthetic Checklist Completed: patient identified, site marked, surgical consent, pre-op evaluation, timeout performed, IV checked, risks and benefits discussed and monitors and equipment checked Spinal Block Patient position: right lateral decubitus Prep: Betadine Patient monitoring: heart rate, cardiac monitor, continuous pulse ox and blood pressure Approach: right paramedian Location: L3-4 Injection technique: single-shot Needle Needle type: Spinocan  Needle gauge: 22 G Needle length: 9 cm Assessment Sensory level: T6 Additional Notes Betadine prep x 3 1% lidocaine skin wheal 1 cc Clear CSF pre and post injection   ATTEMPTS: 1 TRAY ID: [id number] TRAY EXPIRATION DATE: 2014-12

## 2013-03-15 NOTE — Anesthesia Preprocedure Evaluation (Signed)
Anesthesia Evaluation  Patient identified by MRN, date of birth, ID band Patient awake    Reviewed: Allergy & Precautions, H&P , NPO status , Patient's Chart, lab work & pertinent test results  Airway Mallampati: I TM Distance: >3 FB Neck ROM: Full    Dental  (+) Edentulous Upper and Poor Dentition   Pulmonary neg pulmonary ROS,  - rhonchi  Pulmonary exam normal       Cardiovascular negative cardio ROS  Rhythm:Regular Rate:Normal     Neuro/Psych negative neurological ROS  negative psych ROS   GI/Hepatic negative GI ROS, Neg liver ROS,   Endo/Other  negative endocrine ROS  Renal/GU negative Renal ROS     Musculoskeletal   Abdominal Normal abdominal exam  (+)   Peds  Hematology negative hematology ROS (+)   Anesthesia Other Findings   Reproductive/Obstetrics                           Anesthesia Physical Anesthesia Plan  ASA: II  Anesthesia Plan: Spinal   Post-op Pain Management:    Induction: Intravenous  Airway Management Planned: Nasal Cannula  Additional Equipment:   Intra-op Plan:   Post-operative Plan:   Informed Consent: I have reviewed the patients History and Physical, chart, labs and discussed the procedure including the risks, benefits and alternatives for the proposed anesthesia with the patient or authorized representative who has indicated his/her understanding and acceptance.     Plan Discussed with: CRNA  Anesthesia Plan Comments:         Anesthesia Quick Evaluation

## 2013-03-15 NOTE — Progress Notes (Signed)
[facility] PROGRESS NOTE  Ricardo Garrett WUJ:[id number] DOB: Jan 28, 1946 DOA: 03/14/2013 PCP: Catalina Pizza, MD  Assessment/Plan: 1. Right hip fracture status post mechanical fall: Management per orthopedics. Pain control. Clear for surgery.  Code Status: Full code DVT prophylaxis: SCDs preoperatively. Family Communication: Discussed with wife at bedside Disposition Plan: Anticipate home postoperatively.  Ricardo Sacks, MD  [facility]  Pager [phone] If 7PM-7AM, please contact night-coverage at [email or web address], password [facility] 03/15/2013, 9:22 AM  LOS: 1 day   Brief narrative: 67 year old man presented emergency department after a fall resulting in right hip pain. X-ray revealed right hip fracture.  Consultants:  Orthopedics   Procedures:    HPI/Subjective: Afebrile, vital signs stable. Pain better controlled this morning. No chest pain or shortness of breath. No other issues voiced.  Objective: Filed Vitals:   03/15/13 0159 03/15/13 0400 03/15/13 0457 03/15/13 0800  BP: 147/85  124/69   Pulse: 69  58   Temp: 99 F (37.2 C)  98.4 F (36.9 C)   TempSrc: Oral  Oral   Resp: 18 16 18 18   Height:      Weight:      SpO2: 96% 92% 97% 97%    Intake/Output Summary (Last 24 hours) at 03/15/13 0922 Last data filed at 03/15/13 0501  Gross per 24 hour  Intake 411.67 ml  Output    275 ml  Net 136.67 ml   Filed Weights   03/14/13 1349 03/14/13 1802  Weight: 74.844 kg (165 lb) 81.8 kg (180 lb 5.4 oz)    Exam:  General:  Appears calm and comfortable Cardiovascular: RRR, no m/r/g. No LE edema. Respiratory: CTA bilaterally, no w/r/r. Normal respiratory effort. Musculoskeletal: Sensation and movement right foot appear grossly normal. Psychiatric: grossly normal mood and affect, speech fluent and appropriate  Data Reviewed: Basic Metabolic Panel:  Recent Labs Lab 03/14/13 1540  NA 139  K 3.7  CL 103  CO2 24  GLUCOSE 117*  BUN 15  CREATININE 1.23   CALCIUM 8.8   Liver Function Tests:  Recent Labs Lab 03/14/13 1540  AST 23  ALT 24  ALKPHOS 78  BILITOT 0.7  PROT 6.2  ALBUMIN 3.6   CBC:  Recent Labs Lab 03/14/13 1540  WBC 16.1*  NEUTROABS 13.9*  HGB 14.6  HCT 41.2  MCV 88.6  PLT 273     Recent Results (from the past 240 hour(s))  SURGICAL PCR SCREEN     Status: None   Collection Time    03/14/13  7:14 PM      Result Value Range Status   MRSA, PCR NEGATIVE  NEGATIVE Final   Staphylococcus aureus NEGATIVE  NEGATIVE Final   Comment:            The Xpert SA Assay (FDA     approved for NASAL specimens     in patients over 9 years of age),     is one component of     a comprehensive surveillance     program.  Test performance has     been validated by [employer] for patients greater     than or equal to 45 year old.     It is not intended     to diagnose infection nor to     guide or monitor treatment.     Studies: Dg Chest 1 View  03/14/2013   *RADIOLOGY REPORT*  Clinical Data: Fall.  Hip fracture  CHEST - 1 VIEW  Comparison: None.  Findings: The lungs are clear.  Negative for heart failure or pneumonia.  Negative for mass lesion.  No pleural effusion.  IMPRESSION: No acute abnormality.   Original Report Authenticated By: Janeece Riggers, M.D.   Dg Hip Complete Right  03/14/2013   *RADIOLOGY REPORT*  Clinical Data: Fall  RIGHT HIP - COMPLETE 2+ VIEW  Comparison: None.  Findings: Intertrochanteric fracture right femur with moderate angulation and comminution.  Right hip joint appears normal.  No other pelvic fractures.  There is arterial calcification in the femoral artery.  IMPRESSION: Angulated intertrochanteric fracture on the right.   Original Report Authenticated By: Janeece Riggers, M.D.    Scheduled Meds: . atorvastatin  20 mg Oral q1800  .  ceFAZolin (ANCEF) IV  2 g Intravenous Once   Continuous Infusions:   Principal Problem:   Intertrochanteric fracture of right hip     Ricardo Sacks,  MD  [facility] Pager [phone] If 7PM-7AM, please contact night-coverage at [email or web address], password [facility] 03/15/2013, 9:22 AM  LOS: 1 day   Time spent: 15 minutes

## 2013-03-15 NOTE — Brief Op Note (Signed)
03/14/2013 - 03/15/2013  3:38 PM  PATIENT:  Marice Potter  67 y.o. male  PRE-OPERATIVE DIAGNOSIS:  right hip fracture comminuted intertrochanteric  POST-OPERATIVE DIAGNOSIS:  right hip fracture  PROCEDURE:  Procedure(s): COMPRESSION HIP (Right) with placement of [employer] Cable system  SURGEON:  Surgeon(s) and Role:    * Darreld Mclean, MD - Primary  PHYSICIAN ASSISTANT:   ASSISTANTS: C. Annabell Howells, RN   ANESTHESIA:   spinal  EBL:  Total I/O In: -  Out: 800 [Urine:500; Blood:300]  BLOOD ADMINISTERED:none  DRAINS: (Large) Hemovact drain(s) in the right hip area with  Suction Open   LOCAL MEDICATIONS USED:  NONE  SPECIMEN:  No Specimen  DISPOSITION OF SPECIMEN:  N/A  COUNTS:  YES  TOURNIQUET:  * No tourniquets in log *  DICTATION: .Other Dictation: Dictation Number [id number]  PLAN OF CARE: Admit to inpatient   PATIENT DISPOSITION:  PACU - hemodynamically stable.   Delay start of Pharmacological VTE agent (>24hrs) due to surgical blood loss or risk of bleeding: no

## 2013-03-15 NOTE — Progress Notes (Signed)
Placed in 5 pounds [employer]. Tolerated well. Unable to control pain with medication only.
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
## 2013-03-15 NOTE — Progress Notes (Signed)
Utilization Review Complete  

## 2013-03-15 NOTE — Anesthesia Postprocedure Evaluation (Signed)
Anesthesia Post Note  Patient: Ricardo Garrett  Procedure(s) Performed: Procedure(s) (LRB): COMPRESSION HIP (Right)  Anesthesia type: Spinal  Patient location: PACU  Post pain: Pain level controlled  Post assessment: Post-op Vital signs reviewed, Patient's Cardiovascular Status Stable, Respiratory Function Stable, Patent Airway, No signs of Nausea or vomiting and Pain level controlled  Last Vitals:  Filed Vitals:   03/15/13 1554  BP: 101/67  Pulse: 62  Temp:   Resp: 13    Post vital signs: Reviewed and stable  Level of consciousness: awake and alert Moving legs  Complications: No apparent anesthesia complications

## 2013-03-15 NOTE — Care Management Note (Addendum)
    Page 1 of 1   03/18/2013     11:25:39 AM   CARE MANAGEMENT NOTE 03/18/2013  Patient:  Ricardo Garrett, Ricardo Garrett   Account Number:  [id number]  Date Initiated:  03/15/2013  Documentation initiated by:  Rosemary Holms  Subjective/Objective Assessment:   Pt admitted from home where he is independent and lives with spouse. Fell and fx hip. In surgery today and plans to rehab at home. Anticipate HH PT or Outpt PT, pending PT's recommendation.     Action/Plan:   Anticipated DC Date:  03/18/2013   Anticipated DC Plan:  HOME W HOME HEALTH SERVICES      DC Planning Services  CM consult      Choice offered to / List presented to:             Status of service:  Completed, signed off Medicare Important Message given?  YES (If response is "NO", the following Medicare IM given date fields will be blank) Date Medicare IM given:  03/18/2013 Date Additional Medicare IM given:    Discharge Disposition:  SKILLED NURSING FACILITY  Per UR Regulation:    If discussed at Long Length of Stay Meetings, dates discussed:    Comments:  03/18/13 1125 Arlyss Queen, RN BSN CM Pt discharged to [facility] today. CSW to arrange discharge to facility.  03/15/13 Rosemary Holms RN BSN CM

## 2013-03-15 NOTE — Transfer of Care (Signed)
Immediate Anesthesia Transfer of Care Note  Patient: Ricardo Garrett  Procedure(s) Performed: Procedure(s) (LRB): COMPRESSION HIP (Right)  Patient Location: PACU  Anesthesia Type: SAB  Level of Consciousness: awake  Airway & Oxygen Therapy: Patient Spontanous Breathing and non-rebreather face mask  Post-op Assessment: Report given to PACU RN, Post -op Vital signs reviewed and stable. SAB Level  T 12  Post vital signs: Reviewed and stable  Complications: No apparent anesthesia complications

## 2013-03-16 DIAGNOSIS — D62 Acute posthemorrhagic anemia: Secondary | ICD-10-CM

## 2013-03-16 LAB — BASIC METABOLIC PANEL
BUN: 13 mg/dL (ref 6–23)
Chloride: 99 mEq/L (ref 96–112)
Creatinine, Ser: 1.2 mg/dL (ref 0.50–1.35)
Glucose, Bld: 129 mg/dL — ABNORMAL HIGH (ref 70–99)
Potassium: 3.9 mEq/L (ref 3.5–5.1)

## 2013-03-16 LAB — CBC WITH DIFFERENTIAL/PLATELET
Basophils Relative: 0 % (ref 0–1)
HCT: 31.5 % — ABNORMAL LOW (ref 39.0–52.0)
Hemoglobin: 10.6 g/dL — ABNORMAL LOW (ref 13.0–17.0)
Lymphs Abs: 1.2 10*3/uL (ref 0.7–4.0)
MCH: 31.3 pg (ref 26.0–34.0)
MCHC: 33.7 g/dL (ref 30.0–36.0)
Monocytes Absolute: 1.1 10*3/uL — ABNORMAL HIGH (ref 0.1–1.0)
Monocytes Relative: 11 % (ref 3–12)
Neutro Abs: 7.2 10*3/uL (ref 1.7–7.7)
Neutrophils Relative %: 75 % (ref 43–77)
RBC: 3.39 MIL/uL — ABNORMAL LOW (ref 4.22–5.81)

## 2013-03-16 MED ORDER — HYDROMORPHONE HCL PF 1 MG/ML IJ SOLN
1.0000 mg | INTRAMUSCULAR | Status: DC | PRN
  Administered 2013-03-16 – 2013-03-17 (×2): 1 mg via INTRAVENOUS
  Filled 2013-03-16 (×3): qty 1

## 2013-03-16 NOTE — Progress Notes (Signed)
[facility] PROGRESS NOTE  Ricardo Garrett ZOX:[id number] DOB: [birth year] DOA: 03/14/2013 PCP: Catalina Pizza, MD  Assessment/Plan: 1. Right hip fracture status post mechanical fall: Status post surgery 5/16. Management per orthopedics. Pain control.  2. Acute blood loss anemia: Likely secondary to fracture and surgery. Repeat CBC in the morning.   Code Status: Full code DVT prophylaxis: Lovenox Family Communication: Discussed with wife at bedside Disposition Plan: Patient wants to go home at discharge.  Brendia Sacks, MD  [facility]  Pager [phone] If 7PM-7AM, please contact night-coverage at [email or web address], password [facility] 03/16/2013, 1:32 PM  LOS: 2 days   Brief narrative: 67 year old man presented emergency department after a fall resulting in right hip pain. X-ray revealed right hip fracture.  Consultants:  Orthopedics   Procedures:  ORIF right hip fracture  HPI/Subjective: Overall okay. Still has a significant amount of pain. Hoping to go home soon.  Objective: Filed Vitals:   03/16/13 0500 03/16/13 0742 03/16/13 0800 03/16/13 1019  BP: 144/87   127/69  Pulse: 70   71  Temp: 98.5 F (36.9 C)   98.4 F (36.9 C)  TempSrc: Oral   Oral  Resp: 15  16 16   Height:      Weight:      SpO2: 94% 95% 97% 94%    Intake/Output Summary (Last 24 hours) at 03/16/13 1332 Last data filed at 03/16/13 0500  Gross per 24 hour  Intake   1360 ml  Output    950 ml  Net    410 ml   Filed Weights   03/14/13 1349 03/14/13 1802 03/15/13 1221  Weight: 74.844 kg (165 lb) 81.8 kg (180 lb 5.4 oz) 81.647 kg (180 lb)    Exam:  General:  Appears calm and comfortable Cardiovascular: RRR, no m/r/g. No LE edema. Respiratory: CTA bilaterally, no w/r/r. Normal respiratory effort. Psychiatric: grossly normal mood and affect, speech fluent and appropriate  Data Reviewed: Basic Metabolic Panel:  Recent Labs Lab 03/14/13 1540 03/16/13 0650  NA 139 133*  K 3.7 3.9  CL 103  99  CO2 24 27  GLUCOSE 117* 129*  BUN 15 13  CREATININE 1.23 1.20  CALCIUM 8.8 8.2*   Liver Function Tests:  Recent Labs Lab 03/14/13 1540  AST 23  ALT 24  ALKPHOS 78  BILITOT 0.7  PROT 6.2  ALBUMIN 3.6   CBC:  Recent Labs Lab 03/14/13 1540 03/16/13 0650  WBC 16.1* 9.7  NEUTROABS 13.9* 7.2  HGB 14.6 10.6*  HCT 41.2 31.5*  MCV 88.6 92.9  PLT 273 231     Recent Results (from the past 240 hour(s))  SURGICAL PCR SCREEN     Status: None   Collection Time    03/14/13  7:14 PM      Result Value Range Status   MRSA, PCR NEGATIVE  NEGATIVE Final   Staphylococcus aureus NEGATIVE  NEGATIVE Final   Comment:            The Xpert SA Assay (FDA     approved for NASAL specimens     in patients over 69 years of age),     is one component of     a comprehensive surveillance     program.  Test performance has     been validated by [employer] for patients greater     than or equal to 51 year old.     It is not intended     to diagnose infection nor  to     guide or monitor treatment.     Studies: Dg Chest 1 View  03/14/2013   *RADIOLOGY REPORT*  Clinical Data: Fall.  Hip fracture  CHEST - 1 VIEW  Comparison: None.  Findings: The lungs are clear.  Negative for heart failure or pneumonia.  Negative for mass lesion.  No pleural effusion.  IMPRESSION: No acute abnormality.   Original Report Authenticated By: Janeece Riggers, M.D.   Dg Hip Complete Right  03/14/2013   *RADIOLOGY REPORT*  Clinical Data: Fall  RIGHT HIP - COMPLETE 2+ VIEW  Comparison: None.  Findings: Intertrochanteric fracture right femur with moderate angulation and comminution.  Right hip joint appears normal.  No other pelvic fractures.  There is arterial calcification in the femoral artery.  IMPRESSION: Angulated intertrochanteric fracture on the right.   Original Report Authenticated By: Janeece Riggers, M.D.   Dg Hip Operative Right  03/15/2013   *RADIOLOGY REPORT*  Clinical Data: Right hip fixation.  DG  OPERATIVE RIGHT HIP  Comparison: 03/14/2013  Findings: 4 intraoperative images.  These demonstrate fixation of the proximal femur with plate and screw device.  Improved alignment with persistent displacement of comminuted intertrochanteric femur fracture. No acute hardware complication.  Single cerclage wire incidentally noted.  IMPRESSION: Internal fixation of proximal femoral fracture.   Original Report Authenticated By: Jeronimo Greaves, M.D.    Scheduled Meds: . acetaminophen  1,000 mg Intravenous Q6H  . albuterol  2.5 mg Nebulization Q6H  . atorvastatin  20 mg Oral q1800  . enoxaparin (LOVENOX) injection  40 mg Subcutaneous Q24H   Continuous Infusions:   Principal Problem:   Intertrochanteric fracture of right hip     Brendia Sacks, MD  [facility] Pager [phone] If 7PM-7AM, please contact night-coverage at [email or web address], password [facility] 03/16/2013, 1:32 PM  LOS: 2 days   Time spent: 15 minutes

## 2013-03-16 NOTE — Progress Notes (Signed)
Patient experiences desaturation when sleeping, may be compounded by pain medications. 84% SpO2 noted when patient asleep, when awakened increased to 94%. 2L [place] applied post breathing treatment.

## 2013-03-16 NOTE — Progress Notes (Signed)
Subjective: 1 Day Post-Op Procedure(s) (LRB): COMPRESSION HIP (Right) Patient reports pain as 5 on 0-10 scale.    Objective: Vital signs in last 24 hours: Temp:  [97.2 F (36.2 C)-98.7 F (37.1 C)] 98.5 F (36.9 C) (05/17 0500) Pulse Rate:  [57-84] 70 (05/17 0500) Resp:  [9-38] 15 (05/17 0500) BP: (101-161)/(63-100) 144/87 mmHg (05/17 0500) SpO2:  [91 %-100 %] 95 % (05/17 0742) Weight:  [81.647 kg (180 lb)] 81.647 kg (180 lb) (05/16 1221)  Intake/Output from previous day: 05/16 0701 - 05/17 0700 In: 1360 [P.O.:360; I.V.:800; IV Piggyback:200] Out: 1300 [Urine:850; Drains:150; Blood:300] Intake/Output this shift:     Recent Labs  03/14/13 1540 03/16/13 0650  HGB 14.6 10.6*    Recent Labs  03/14/13 1540 03/16/13 0650  WBC 16.1* 9.7  RBC 4.65 3.39*  HCT 41.2 31.5*  PLT 273 231    Recent Labs  03/14/13 1540 03/16/13 0650  NA 139 133*  K 3.7 3.9  CL 103 99  CO2 24 27  BUN 15 13  CREATININE 1.23 1.20  GLUCOSE 117* 129*  CALCIUM 8.8 8.2*   No results found for this basename: LABPT, INR,  in the last 72 hours  Neurologically intact Neurovascular intact Sensation intact distally Intact pulses distally Dorsiflexion/Plantar flexion intact  He has restless night.  Begin PT today.    Assessment/Plan: 1 Day Post-Op Procedure(s) (LRB): COMPRESSION HIP (Right) Up with therapy  Ricardo Garrett 03/16/2013, 8:40 AM

## 2013-03-16 NOTE — Anesthesia Postprocedure Evaluation (Signed)
Anesthesia Post Note  Patient: Ricardo Garrett  Procedure(s) Performed: Procedure(s) (LRB): COMPRESSION HIP (Right)  Anesthesia type: Spinal  Patient location: 324  Post pain: Pain level moderate; With physical therapy at time of visit  Post assessment: Post-op Vital signs reviewed, Patient's Cardiovascular Status Stable, Respiratory Function Stable, Patent Airway, No signs of Nausea or vomiting and Pain level controlled  Last Vitals:  Filed Vitals:   03/16/13 1019  BP: 127/69  Pulse: 71  Temp: 36.9 C  Resp: 16    Post vital signs: Reviewed and stable  Level of consciousness: awake and alert   Complications: No apparent anesthesia complications

## 2013-03-16 NOTE — Evaluation (Signed)
Physical Therapy Evaluation Patient Details Name: Ricardo Garrett MRN: [id number] DOB: 28-Feb-1946 Today's Date: 03/16/2013 Time: 1045-     PT Assessment / Plan / Recommendation Clinical Impression  Ricardo Garrett participated well in therapy; pain managed by medication administered by RN during evaluation. Pt requires increased assistance from bed mobility to transfer activities (bed<>3in1 commode) at [facility] using a RW. Pt c/o upon active assistive ROM to RLE (PS5/10). Pt and spouse plan to return home; pt currently at [facility] for transfers and TTWB to RLE with concomittant post-surgical pain. Explained the probable barriers regarding d/c to home and made aware of pt's current mobility status. Short-term SNF placement for functional mobility/strength training seem to be appropriate at this time for safety, however, family member and pt prefer to return home with HHPT.      PT Assessment  Patient needs continued PT services    Follow Up Recommendations  SNF;Home health PT    Does the patient have the potential to tolerate intense rehabilitation      Barriers to Discharge Decreased caregiver support      Equipment Recommendations       Recommendations for Other Services     Frequency 7X/week    Precautions / Restrictions Precautions Precautions: Fall Restrictions Weight Bearing Restrictions: Yes RLE Weight Bearing: Touchdown weight bearing         Mobility  Bed Mobility Bed Mobility: Supine to Sit;Sitting - Scoot to Edge of Bed;Sit to Supine Supine to Sit: 3: Mod assist Sitting - Scoot to Edge of Bed: 3: Mod assist Sit to Supine: 3: Mod assist Transfers Transfers: Sit to Stand;Stand Pivot Transfers Sit to Stand: 3: Mod assist;From bed;From chair/3-in-1 Stand Pivot Transfers: 3: Mod assist    Exercises Total Joint Exercises Ankle Circles/Pumps: AROM;AAROM;Both Quad Sets: AROM;AAROM;Both Short Arc Quad: AROM;AAROM;Both;20 reps Heel Slides: AROM;AAROM;Both;20 reps Hip  ABduction/ADduction: AROM;AAROM;20 reps Knee Flexion: AROM;AAROM;Both   PT Diagnosis: Generalized weakness;Acute pain  PT Problem List: Decreased strength;Decreased range of motion;Decreased activity tolerance;Decreased balance;Decreased mobility;Pain PT Treatment Interventions: Gait training;Functional mobility training;Therapeutic activities;Therapeutic exercise;Patient/family education;Balance training   PT Goals Acute Rehab PT Goals PT Goal Formulation: With patient Time For Goal Achievement: 03/23/13 Potential to Achieve Goals: Good Pt will go Supine/Side to Sit: with supervision PT Goal: Supine/Side to Sit - Progress: Goal set today Pt will go Sit to Supine/Side: with supervision PT Goal: Sit to Supine/Side - Progress: Goal set today Pt will go Sit to Stand: with mod assist PT Goal: Sit to Stand - Progress: Goal set today Pt will go Stand to Sit: with min assist PT Goal: Stand to Sit - Progress: Goal set today Pt will Transfer Bed to Chair/Chair to Bed: with mod assist PT Transfer Goal: Bed to Chair/Chair to Bed - Progress: Goal set today Pt will Ambulate: 1 - 15 feet;with mod assist;with rolling walker PT Goal: Ambulate - Progress: Goal set today  Visit Information  Last PT Received On: 03/16/13    Subjective Data  Subjective: To walk and return home    Prior Functioning  Home Living Lives With: Spouse Prior Function Level of Independence: Independent Driving: Yes Communication Communication: No difficulties    Cognition  Cognition Arousal/Alertness: Awake/alert Overall Cognitive Status: Within Functional Limits for tasks assessed    Extremity/Trunk Assessment     Balance    End of Session PT - End of Session Equipment Utilized During Treatment: Gait belt Activity Tolerance: Patient tolerated treatment well Patient left: in bed;with call bell/phone within reach;with family/visitor present  Nurse Communication: Mobility status;Patient requests pain meds;Weight  bearing status;Precautions  GP     Icholas Irby, [name] 03/16/2013, 12:11 PM

## 2013-03-16 NOTE — Op Note (Signed)
NAME:  NARADA, UZZLE NO.:  [id number]  MEDICAL RECORD NO.:  [id number]  LOCATION:  A324                          FACILITY:  [facility]  PHYSICIAN:  J. Darreld Mclean, M.D. DATE OF BIRTH:  Mar 10, 1946  DATE OF PROCEDURE: DATE OF DISCHARGE:                              OPERATIVE REPORT   PREOPERATIVE DIAGNOSIS:  Comminuted intertrochanteric fracture of the right hip.  POSTOPERATIVE DIAGNOSIS:  Comminuted intertrochanteric fracture of the right hip.  PROCEDURE:  Open treatment and internal reduction of right hip comminuted fracture, intertrochanteric type using a Smith and [employer]hew hip screw compression system with 145-degree angle four-hole short barrel sideplate and a 90 mm compression screw.  It was also supplemented with [employer] cable proximally due to the marked comminution the patient had.  ANESTHESIA:  Spinal.  SURGEON:  J. Darreld Mclean, M.D.  ASSISTANT:  Cecile Sheerer, RN.  ESTIMATED BLOOD LOSS:  250 to 300 mL, none replaced.  DRAINS:  One large Hemovac drain placed.  INDICATIONS:  The patient fell yesterday and sustained the above- mentioned injury.  He was admitted through the emergency room.  I was consulted, the patient is on the Hospitalist Service.  I explained to the patient seriousness of his injury and need for surgery.  Risks and imponderables were discussed in detail with the patient.  He asked appropriate questions and appeared to understand.  DESCRIPTION OF PROCEDURE:  The patient was seen in the holding area, identified that the right hip as correct surgical site.  I placed a mark on the right hip.  I talked to the patient and his family.  The patient then brought to the operating room, given spinal anesthesia, transferred to the fracture table.  Hip was reduced and positioning looked good using the C-arm fluoroscopy unit.  Prior to using C-arm fluoroscopy unit, everyone had on aprons, lead shields, thyroid shields,  x-ray identification badges.  C-arm was working properly before it was attempted to be used.  The patient was then prepped and draped in usual manner.  We had a time-out identifying the patient as Mr. Karstens and we were doing his right hip for intertrochanteric fracture.  All instrumentation was properly positioned, C-arm was working well.  The OR team knew each other.  Incision made through skin, subcutaneous tissue, tensor fascia lata, vastus lateralis, the femoral shaft was identified.  Guide pin was placed, looked good in AP and lateral views and measured at a 145-degree angle.  A 145-degree screw was then placed with good AP and lateral views.  Then screw holes were made.  First the screws were long, but I kept it because it gave interfragmentary compression and was holding everything together very nicely.  Other screws measured from 38-36 mm. Approximately he had a markedly comminuted fracture.  For extra stability, I placed a Dall-[employer] cable in here.  This was done over the fixation device previously applied.  Cable was applied.  The crimper was used for good fixation.  Permanent pictures were taken.  Hemovac drain was placed, sewn in with 2-0 silk suture.  Vastus lateralis reapproximated using running #1 Surgilon locking suture.  Tensor fascia lata reapproximated using interrupted figure-of-eight #1 Surgilon suture.  Subcutaneous tissue closed with 2-0 plain and skin closed with skin staples.  Sterile dressing applied.  The patient tolerated the procedure well, will go to recovery in good condition.          ______________________________ Shela Commons. Darreld Mclean, M.D.     JWK/MEDQ  D:  03/15/2013  T:  03/16/2013  Job:  [id number]  cc:   Brendia Sacks, MD

## 2013-03-17 LAB — BASIC METABOLIC PANEL
BUN: 13 mg/dL (ref 6–23)
Calcium: 8.1 mg/dL — ABNORMAL LOW (ref 8.4–10.5)
Chloride: 102 mEq/L (ref 96–112)
Creatinine, Ser: 1.01 mg/dL (ref 0.50–1.35)
GFR calc Af Amer: 86 mL/min — ABNORMAL LOW (ref >90)

## 2013-03-17 LAB — CBC WITH DIFFERENTIAL/PLATELET
Basophils Absolute: 0 10*3/uL (ref 0.0–0.1)
Basophils Relative: 0 % (ref 0–1)
Eosinophils Absolute: 0.2 10*3/uL (ref 0.0–0.7)
Eosinophils Relative: 2 % (ref 0–5)
HCT: 29.4 % — ABNORMAL LOW (ref 39.0–52.0)
Hemoglobin: 10.1 g/dL — ABNORMAL LOW (ref 13.0–17.0)
MCH: 31.6 pg (ref 26.0–34.0)
MCHC: 34.4 g/dL (ref 30.0–36.0)
MCV: 91.9 fL (ref 78.0–100.0)
Monocytes Absolute: 1.2 10*3/uL — ABNORMAL HIGH (ref 0.1–1.0)
Monocytes Relative: 12 % (ref 3–12)
Neutro Abs: 7.8 10*3/uL — ABNORMAL HIGH (ref 1.7–7.7)
RDW: 13.6 % (ref 11.5–15.5)

## 2013-03-17 MED ORDER — HYDROMORPHONE HCL PF 1 MG/ML IJ SOLN: 1.0000 mg | INTRAMUSCULAR | Status: DC | PRN

## 2013-03-17 NOTE — Progress Notes (Signed)
Physical Therapy Treatment Patient Details Name: Ricardo Garrett MRN: [id number] DOB: 1946/04/23 Today's Date: 03/17/2013 Time: 4098-1191    PT Assessment / Plan / Recommendation Comments on Treatment Session  Pt still presents with most difficulty performing AROM to RLE due to pain, thus, requires manual assistance from PT. Pt c/o low back pain as well while sitting; Max A during transfers using a RW (Bed to 3in1 commoded to recliner chair) (TTWB to RLE). Family members present during tx session. Pt was able to sit on EOB and advised to sit on recliner with nurse aware for ~3hrs today. Pt may be more of a SNF placement candidate at this time due to increased need for strengthening and functional mobility training in order to have safe return to home and PLOF.    Follow Up Recommendations  SNF     Does the patient have the potential to tolerate intense rehabilitation     Barriers to Discharge        Equipment Recommendations       Recommendations for Other Services    Frequency     Plan Discharge plan remains appropriate    Precautions / Restrictions         Mobility  Bed Mobility Bed Mobility: Supine to Sit Supine to Sit: 4: Min assist Sitting - Scoot to Edge of Bed: 4: Min assist Transfers Transfers: Sit to Stand;Stand to Sit;Stand Pivot Transfers Sit to Stand: 2: Max assist;From chair/3-in-1;From bed;With armrests Stand to Sit: 3: Mod assist Stand Pivot Transfers: 2: Max assist    Exercises General Exercises - Lower Extremity Ankle Circles/Pumps: AROM;Both;20 reps;Supine Quad Sets: AROM;Right;10 reps;Supine Short Arc Quad: AROM;AAROM;Both;10 reps;Supine Long Arc Quad: AROM;AAROM;Both;10 reps;Seated Toe Raises: AROM;Both;10 reps;Seated Heel Raises: AROM;Both;10 reps;Seated   PT Diagnosis:    PT Problem List:   PT Treatment Interventions:     PT Goals    Visit Information  Last PT Received On: 03/17/13    Subjective Data  Subjective: Pain to (R) hip and  occasionally on low back region while sitting  Patient Stated Goal: To return home    Cognition       Balance     End of Session PT - End of Session Equipment Utilized During Treatment: Gait belt Activity Tolerance: Patient tolerated treatment well Patient left: in chair;with call bell/phone within reach;with family/visitor present Nurse Communication: Mobility status;Weight bearing status   GP     [name], [name] 03/17/2013, 12:33 PM

## 2013-03-17 NOTE — Progress Notes (Signed)
[facility] PROGRESS NOTE  Ricardo Garrett UJW:[id number] DOB: [birth year] DOA: 03/14/2013 PCP: Catalina Pizza, MD  Assessment/Plan: 1. Right hip fracture status post mechanical fall: Status post surgery 5/16. Management per orthopedics. Pain control.  2. Acute blood loss anemia: Likely secondary to fracture and surgery. Stable. No further evaluation suggested.   Continue pain management, physical therapy  We discussed options on discharge. I have recommend skilled nursing facility although the patient remains reluctant. Continues to desire to go home, I think is unrealistic and I discussed this with him and his daughter at bedside.  Anticipated discharge 5/19  Code Status: Full code DVT prophylaxis: Lovenox Family Communication: Discussed with wife at bedside Disposition Plan: Patient wants to go home at discharge.  Brendia Sacks, MD  [facility]  Pager [phone] If 7PM-7AM, please contact night-coverage at [email or web address], password [email or web address] 03/17/2013, 10:59 AM  LOS: 3 days   Brief narrative: 67 year old man presented emergency department after a fall resulting in right hip pain. X-ray revealed right hip fracture.  Consultants:  Orthopedics   Procedures:  5/17 ORIF right hip fracture  HPI/Subjective: Remains afebrile. Vitals stable. He reports pain adequately controlled with medication, however he has a severe amount of pain with any movement.  Objective: Filed Vitals:   03/17/13 0400 03/17/13 0500 03/17/13 0800 03/17/13 0852  BP:  134/60    Pulse:  79    Temp:  99.3 F (37.4 C)    TempSrc:  Oral    Resp: 18 19 20    Height:      Weight:      SpO2: 98% 92% 92% 95%    Intake/Output Summary (Last 24 hours) at 03/17/13 1059 Last data filed at 03/17/13 0459  Gross per 24 hour  Intake    360 ml  Output    225 ml  Net    135 ml   Filed Weights   03/14/13 1349 03/14/13 1802 03/15/13 1221  Weight: 74.844 kg (165 lb) 81.8 kg (180 lb 5.4 oz) 81.647 kg (180 lb)     Exam:  General: Appears calm and comfortable Cardiovascular: RRR, no m/r/g. No RLE edema. Respiratory: CTA bilaterally, no w/r/r. Normal respiratory effort. Psychiatric: grossly normal mood and affect, speech fluent and appropriate  Data Reviewed: Basic Metabolic Panel:  Recent Labs Lab 03/14/13 1540 03/16/13 0650 03/17/13 0638  NA 139 133* 135  K 3.7 3.9 3.8  CL 103 99 102  CO2 24 27 26   GLUCOSE 117* 129* 122*  BUN 15 13 13   CREATININE 1.23 1.20 1.01  CALCIUM 8.8 8.2* 8.1*   Liver Function Tests:  Recent Labs Lab 03/14/13 1540  AST 23  ALT 24  ALKPHOS 78  BILITOT 0.7  PROT 6.2  ALBUMIN 3.6   CBC:  Recent Labs Lab 03/14/13 1540 03/16/13 0650 03/17/13 0638  WBC 16.1* 9.7 10.2  NEUTROABS 13.9* 7.2 7.8*  HGB 14.6 10.6* 10.1*  HCT 41.2 31.5* 29.4*  MCV 88.6 92.9 91.9  PLT 273 231 223     Recent Results (from the past 240 hour(s))  SURGICAL PCR SCREEN     Status: None   Collection Time    03/14/13  7:14 PM      Result Value Range Status   MRSA, PCR NEGATIVE  NEGATIVE Final   Staphylococcus aureus NEGATIVE  NEGATIVE Final   Comment:            The Xpert SA Assay (FDA     approved for NASAL specimens     in patients  over 65 years of age),     is one component of     a comprehensive surveillance     program.  Test performance has     been validated by [employer] for patients greater     than or equal to 88 year old.     It is not intended     to diagnose infection nor to     guide or monitor treatment.     Studies: Dg Hip Operative Right  04-04-2013   *RADIOLOGY REPORT*  Clinical Data: Right hip fixation.  DG OPERATIVE RIGHT HIP  Comparison: 03/14/2013  Findings: 4 intraoperative images.  These demonstrate fixation of the proximal femur with plate and screw device.  Improved alignment with persistent displacement of comminuted intertrochanteric femur fracture. No acute hardware complication.  Single cerclage wire incidentally noted.   IMPRESSION: Internal fixation of proximal femoral fracture.   Original Report Authenticated By: Jeronimo Greaves, M.D.    Scheduled Meds: . albuterol  2.5 mg Nebulization Q6H  . atorvastatin  20 mg Oral q1800  . enoxaparin (LOVENOX) injection  40 mg Subcutaneous Q24H   Continuous Infusions:   Principal Problem:   Intertrochanteric fracture of right hip Active Problems:   Acute blood loss anemia     Brendia Sacks, MD  [facility] Pager [phone] If 7PM-7AM, please contact night-coverage at [email or web address], password [facility] 03/17/2013, 10:59 AM  LOS: 3 days   Time spent: 15 minutes

## 2013-03-17 NOTE — Progress Notes (Signed)
Subjective: 2 Days Post-Op Procedure(s) (LRB): COMPRESSION HIP (Right) Patient reports pain as 4 on 0-10 scale.    Objective: Vital signs in last 24 hours: Temp:  [98.4 F (36.9 C)-99.3 F (37.4 C)] 99.3 F (37.4 C) (05/18 0500) Pulse Rate:  [71-82] 79 (05/18 0500) Resp:  [16-20] 19 (05/18 0500) BP: (112-134)/(60-74) 134/60 mmHg (05/18 0500) SpO2:  [84 %-98 %] 92 % (05/18 0500)  Intake/Output from previous day: 05/17 0701 - 05/18 0700 In: 480 [P.O.:480] Out: 625 [Urine:625] Intake/Output this shift:     Recent Labs  03/14/13 1540 03/16/13 0650 03/17/13 0638  HGB 14.6 10.6* 10.1*    Recent Labs  03/16/13 0650 03/17/13 0638  WBC 9.7 10.2  RBC 3.39* 3.20*  HCT 31.5* 29.4*  PLT 231 223    Recent Labs  03/16/13 0650 03/17/13 0638  NA 133* 135  K 3.9 3.8  CL 99 102  CO2 27 26  BUN 13 13  CREATININE 1.20 1.01  GLUCOSE 129* 122*  CALCIUM 8.2* 8.1*   No results found for this basename: LABPT, INR,  in the last 72 hours  Neurologically intact Neurovascular intact Sensation intact distally Intact pulses distally Dorsiflexion/Plantar flexion intact Incision: scant drainage  His labs are stable.  He has pain in getting out of bed and is reluctant to do so.  Hemovac and foley removed.   Assessment/Plan: 2 Days Post-Op Procedure(s) (LRB): COMPRESSION HIP (Right) Up with therapy  Ricardo Garrett 03/17/2013, 8:09 AM

## 2013-03-18 ENCOUNTER — Telehealth: Payer: Self-pay | Admitting: *Deleted

## 2013-03-18 ENCOUNTER — Encounter (HOSPITAL_COMMUNITY): Payer: Self-pay | Admitting: Orthopaedic Surgery

## 2013-03-18 ENCOUNTER — Inpatient Hospital Stay
Admission: RE | Admit: 2013-03-18 | Discharge: 2013-04-26 | Disposition: A | Discharge status: Home / Self Care | Payer: Medicare Other | Source: Ambulatory Visit | Attending: Internal Medicine | Admitting: Internal Medicine

## 2013-03-18 DIAGNOSIS — T148XXA Other injury of unspecified body region, initial encounter: Principal | ICD-10-CM

## 2013-03-18 LAB — TYPE AND SCREEN: Unit division: 0

## 2013-03-18 LAB — CBC WITH DIFFERENTIAL/PLATELET
Eosinophils Absolute: 0.4 10*3/uL (ref 0.0–0.7)
Lymphocytes Relative: 13 % (ref 12–46)
Lymphs Abs: 1.1 10*3/uL (ref 0.7–4.0)
Neutro Abs: 6.4 10*3/uL (ref 1.7–7.7)
Neutrophils Relative %: 73 % (ref 43–77)
Platelets: 252 10*3/uL (ref 150–400)
RBC: 3.27 MIL/uL — ABNORMAL LOW (ref 4.22–5.81)
WBC: 8.8 10*3/uL (ref 4.0–10.5)

## 2013-03-18 MED ORDER — HYDROCODONE-ACETAMINOPHEN 5-325 MG PO TABS: ORAL_TABLET | ORAL | Status: DC

## 2013-03-18 MED ORDER — ACETAMINOPHEN 325 MG PO TABS: 650.0000 mg | ORAL_TABLET | Freq: Four times a day (QID) | ORAL | Status: DC | PRN

## 2013-03-18 MED ORDER — ENOXAPARIN SODIUM 40 MG/0.4ML ~~LOC~~ SOLN: 40.0000 mg | SUBCUTANEOUS | Status: DC

## 2013-03-18 MED ORDER — HYDROCODONE-ACETAMINOPHEN 5-325 MG PO TABS: 1.0000 | ORAL_TABLET | Freq: Four times a day (QID) | ORAL | Status: DC | PRN

## 2013-03-18 NOTE — Clinical Social Work Placement (Signed)
Clinical Social Work Department CLINICAL SOCIAL WORK PLACEMENT NOTE 03/18/2013  Patient:  Ricardo Garrett, Ricardo Garrett  Account Number:  [id number] Admit date:  03/14/2013  Clinical Social Worker:  Derenda Fennel, LCSW  Date/time:  03/18/2013 10:10 AM  Clinical Social Work is seeking post-discharge placement for this patient at the following level of care:   SKILLED NURSING   (*CSW will update this form in Epic as items are completed)   03/18/2013  Patient/family provided with [facility] Department of Clinical Social Work's list of facilities offering this level of care within the geographic area requested by the patient (or if unable, by the patient's family).  03/18/2013  Patient/family informed of their freedom to choose among providers that offer the needed level of care, that participate in Medicare, Medicaid or managed care program needed by the patient, have an available bed and are willing to accept the patient.  03/18/2013  Patient/family informed of [facility]' ownership interest in [facility], as well as of the fact that they are under no obligation to receive care at this facility.  PASARR submitted to EDS on 03/18/2013 PASARR number received from EDS on 03/18/2013  FL2 transmitted to all facilities in geographic area requested by pt/family on  03/18/2013 FL2 transmitted to all facilities within larger geographic area on   Patient informed that his/her managed care company has contracts with or will negotiate with  certain facilities, including the following:     Patient/family informed of bed offers received:   Patient chooses bed at  Physician recommends and patient chooses bed at    Patient to be transferred to  on   Patient to be transferred to facility by   The following physician request were entered in Epic:   Additional Comments:  Derenda Fennel, LCSW [phone]

## 2013-03-18 NOTE — Progress Notes (Signed)
Subjective: 3 Days Post-Op Procedure(s) (LRB): COMPRESSION HIP (Right) Patient reports pain as 4 on 0-10 scale.    Objective: Vital signs in last 24 hours: Temp:  [99.9 F (37.7 C)-100.7 F (38.2 C)] 99.9 F (37.7 C) (05/19 0500) Pulse Rate:  [70-84] 70 (05/19 0500) Resp:  [16-20] 19 (05/19 0500) BP: (127-128)/(72-78) 127/78 mmHg (05/19 0500) SpO2:  [92 %-99 %] 96 % (05/19 0717)  Intake/Output from previous day: 05/18 0701 - 05/19 0700 In: 480 [P.O.:480] Out: 475 [Urine:475] Intake/Output this shift:     Recent Labs  03/16/13 0650 03/17/13 0638  HGB 10.6* 10.1*    Recent Labs  03/16/13 0650 03/17/13 0638  WBC 9.7 10.2  RBC 3.39* 3.20*  HCT 31.5* 29.4*  PLT 231 223    Recent Labs  03/16/13 0650 03/17/13 0638  NA 133* 135  K 3.9 3.8  CL 99 102  CO2 27 26  BUN 13 13  CREATININE 1.20 1.01  GLUCOSE 129* 122*  CALCIUM 8.2* 8.1*   No results found for this basename: LABPT, INR,  in the last 72 hours  Neurologically intact Neurovascular intact Sensation intact distally Intact pulses distally Dorsiflexion/Plantar flexion intact Incision: scant drainage  Assessment/Plan: 3 Days Post-Op Procedure(s) (LRB): COMPRESSION HIP (Right) Up with therapy  He is making slow progress in PT.  I have recommended serious consideration for SNF placement.  He wants to go home.  His financial situation is difficult he says.  I will have the social worker talk to him.  I feel he will do better if he goes to SNF for 10 days to two weeks.    His labs are not available this morning.  Dlisa Barnwell 03/18/2013, 7:51 AM

## 2013-03-18 NOTE — Progress Notes (Signed)
Physical Therapy Treatment Patient Details Name: Ricardo Garrett MRN: [id number] DOB: [birth year] Today's Date: 03/18/2013 Time: 0820-0920 PT Time Calculation (min): 60 min  PT Assessment / Plan / Recommendation Comments on Treatment Session  Pt continues to be limited by weakness. Pt requires NMR to properly contraction right quad. Attempted weight shift and then walking but pt is unable to flex right hip to take a step. Pt is very discouraged about his current state. Educated pt on the benefits of going to SNF at discharge. Pt is now agreeable.                      Plan  (Continue per PT POC.)           Mobility  Transfers Transfers: Sit to Stand;Stand to Sit;Stand Pivot Transfers Sit to Stand: 3: Mod assist;From bed Stand to Sit: 3: Mod assist;To chair/3-in-1 Stand Pivot Transfers: 3: Mod assist    Exercises General Exercises - Lower Extremity Ankle Circles/Pumps: AROM;Both;20 reps;Supine Quad Sets: AROM;Right;10 reps;Supine Short Arc Quad: AROM;AAROM;Both;10 reps;Supine Long Arc Quad: AROM;AAROM;Both;10 reps;Seated    Visit Information  Last PT Received On: 03/18/13    Subjective Data  Subjective: "I've been having a hard time with therapy."         End of Session PT - End of Session Equipment Utilized During Treatment: Gait belt Activity Tolerance: Patient tolerated treatment well Patient left: in chair;with call bell/phone within reach;Other (comment) (With case worker in room)   Seth Bake, PTA  03/18/2013, 9:40 AM

## 2013-03-18 NOTE — Plan of Care (Signed)
Problem: Discharge Progression Outcomes Goal: Ambulates safely using assistive device Outcome: Progressing Out of bed to chair this am. Goal: Follows weight - bearing limitations Outcome: Progressing Touch down weight bearing.

## 2013-03-18 NOTE — Progress Notes (Signed)
Patient discharged to [facility] called,and given to Auburn Bilberry LPN.Vital signs stable.Family aware of discharge.Transported to [facility] by staff.

## 2013-03-18 NOTE — Progress Notes (Signed)
[facility] PROGRESS NOTE  Ricardo Garrett ZOX:[id number] DOB: 10/13/1946 DOA: 03/14/2013 PCP: Catalina Pizza, MD  Assessment/Plan: 1. Right hip fracture status post mechanical fall: Status post surgery 5/16. Management per orthopedics. Discussed with Dr. Minda Ditto for discharge. 2. Acute blood loss anemia: Likely secondary to fracture and surgery. Stable. No further evaluation suggested.   Followup with orthopedics as directed  Discharge today  Code Status: Full code DVT prophylaxis: Lovenox Family Communication: Discussed with wife at bedside Disposition Plan: Patient wants to go home at discharge.  Brendia Sacks, MD  [facility]  Pager [phone] If 7PM-7AM, please contact night-coverage at [email or web address], password [facility] 03/18/2013, 10:36 AM  LOS: 4 days   Brief narrative: 67 year old man presented emergency department after a fall resulting in right hip pain. X-ray revealed right hip fracture.  Consultants:  Orthopedics   Procedures:  5/17 ORIF right hip fracture  HPI/Subjective: Low-grade fever last night. Vitals otherwise stable. Continues to have right hip pain. Now agreeable to going to skilled nursing facility.  Objective: Filed Vitals:   03/18/13 0000 03/18/13 0400 03/18/13 0500 03/18/13 0717  BP:   127/78   Pulse:   70   Temp:   99.9 F (37.7 C)   TempSrc:   Oral   Resp: 16 16 19    Height:      Weight:      SpO2: 96% 96% 97% 96%    Intake/Output Summary (Last 24 hours) at 03/18/13 1036 Last data filed at 03/18/13 0936  Gross per 24 hour  Intake    600 ml  Output    825 ml  Net   -225 ml   Filed Weights   03/14/13 1349 03/14/13 1802 03/15/13 1221  Weight: 74.844 kg (165 lb) 81.8 kg (180 lb 5.4 oz) 81.647 kg (180 lb)    Exam:  General: Appears calm and comfortable Cardiovascular: RRR, no m/r/g.  Respiratory: CTA bilaterally, no w/r/r. Normal respiratory effort. Psychiatric: grossly normal mood and affect, speech fluent and  appropriate  Data Reviewed: Basic Metabolic Panel:  Recent Labs Lab 03/14/13 1540 03/16/13 0650 03/17/13 0638  NA 139 133* 135  K 3.7 3.9 3.8  CL 103 99 102  CO2 24 27 26   GLUCOSE 117* 129* 122*  BUN 15 13 13   CREATININE 1.23 1.20 1.01  CALCIUM 8.8 8.2* 8.1*   Liver Function Tests:  Recent Labs Lab 03/14/13 1540  AST 23  ALT 24  ALKPHOS 78  BILITOT 0.7  PROT 6.2  ALBUMIN 3.6   CBC:  Recent Labs Lab 03/14/13 1540 03/16/13 0650 03/17/13 0638 03/18/13 0800  WBC 16.1* 9.7 10.2 8.8  NEUTROABS 13.9* 7.2 7.8* 6.4  HGB 14.6 10.6* 10.1* 10.2*  HCT 41.2 31.5* 29.4* 30.0*  MCV 88.6 92.9 91.9 91.7  PLT 273 231 223 252     Recent Results (from the past 240 hour(s))  SURGICAL PCR SCREEN     Status: None   Collection Time    03/14/13  7:14 PM      Result Value Range Status   MRSA, PCR NEGATIVE  NEGATIVE Final   Staphylococcus aureus NEGATIVE  NEGATIVE Final   Comment:            The Xpert SA Assay (FDA     approved for NASAL specimens     in patients over 71 years of age),     is one component of     a comprehensive surveillance     program.  Test performance has  been validated by [facility] for patients greater     than or equal to 41 year old.     It is not intended     to diagnose infection nor to     guide or monitor treatment.     Studies: No results found.  Scheduled Meds: . albuterol  2.5 mg Nebulization Q6H  . atorvastatin  20 mg Oral q1800  . enoxaparin (LOVENOX) injection  40 mg Subcutaneous Q24H   Continuous Infusions:   Principal Problem:   Intertrochanteric fracture of right hip Active Problems:   Acute blood loss anemia     Brendia Sacks, MD  [facility] Pager [phone] If 7PM-7AM, please contact night-coverage at [email or web address], password [facility] 03/18/2013, 10:36 AM  LOS: 4 days

## 2013-03-18 NOTE — Clinical Social Work Psychosocial (Signed)
Clinical Social Work Department BRIEF PSYCHOSOCIAL ASSESSMENT 03/18/2013  Patient:  MAXEMILIANO, RIEL     Account Number:  [id number]     Admit date:  03/14/2013  Clinical Social Worker:  Nancie Neas  Date/Time:  03/18/2013 10:15 AM  Referred by:  Physician  Date Referred:  03/18/2013 Referred for  SNF Placement   Other Referral:   Interview type:  Patient Other interview type:    PSYCHOSOCIAL DATA Living Status:  WIFE Admitted from facility:   Level of care:   Primary support name:  Judeth Cornfield Primary support relationship to patient:  SPOUSE Degree of support available:   supportive per pt    CURRENT CONCERNS Current Concerns  Post-Acute Placement   Other Concerns:    SOCIAL WORK ASSESSMENT / PLAN CSW met with pt at bedside. Pt alert and oriented and just completed therapy session. He fell off his back deck, about 3 feet on Thursday and fractured his hip. Surgery was Friday. Pt planned to return home with home health but has made very slow progress with therapy and is in a lot of pain still with movement. At baseline, pt works in [employer] and is very active. He reports this surgery has been difficult as he hasn't had surgery in 45 years. Pt lives with his wife and has a daughter and granddaughter nearby as well. CSW discussed placement process and pt is agreeable. He understands that he will benefit from more therapy. Pt requests to be in [place] or [place]. Pt plans to go to SNF for about 10 days only. SNF list provided.   Assessment/plan status:  Psychosocial Support/Ongoing Assessment of Needs Other assessment/ plan:   Information/referral to community resources:   SNF list    PATIENT'S/FAMILY'S RESPONSE TO PLAN OF CARE: CSW will fax out FL2 and initiate [employer] authorization. Pt is stable for transfer to SNF today. Will follow up with bed offers when available.       Derenda Fennel, [place] [phone]

## 2013-03-18 NOTE — Progress Notes (Signed)
UR chart review completed.  

## 2013-03-18 NOTE — Progress Notes (Signed)
Pt is asleep nebulizer held at this time.

## 2013-03-18 NOTE — Clinical Social Work Note (Signed)
Pt d/c today to SNF. CSW presented bed offers and pt chooses [facility]. Facility notified and has received authorization from [facility]. D/C summary faxed. Pt to transfer with RN.  Derenda Fennel, [place] [phone]

## 2013-03-18 NOTE — Clinical Social Work Placement (Signed)
Clinical Social Work Department CLINICAL SOCIAL WORK PLACEMENT NOTE 03/18/2013  Patient:  Ricardo Garrett, Ricardo Garrett  Account Number:  [id number] Admit date:  03/14/2013  Clinical Social Worker:  Derenda Fennel, LCSW Date/time:  03/18/2013 10:10 AM  Clinical Social Work is seeking post-discharge placement for this patient at the following level of care:   SKILLED NURSING   (*CSW will update this form in Epic as items are completed)   03/18/2013  Patient/family provided with [facility] Department of Clinical Social Work's list of facilities offering this level of care within the geographic area requested by the patient (or if unable, by the patient's family).  03/18/2013  Patient/family informed of their freedom to choose among providers that offer the needed level of care, that participate in Medicare, Medicaid or managed care program needed by the patient, have an available bed and are willing to accept the patient.  03/18/2013  Patient/family informed of [facility]' ownership interest in [facility], as well as of the fact that they are under no obligation to receive care at this facility.  PASARR submitted to EDS on 03/18/2013 PASARR number received from EDS on 03/18/2013  FL2 transmitted to all facilities in geographic area requested by pt/family on  03/18/2013 FL2 transmitted to all facilities within larger geographic area on   Patient informed that his/her managed care company has contracts with or will negotiate with  certain facilities, including the following:     Patient/family informed of bed offers received:  03/18/2013 Patient chooses bed at [facility] Physician recommends and patient chooses bed at  [facility]  Patient to be transferred to [facility] on  03/18/2013 Patient to be transferred to facility by RN  The following physician request were entered in Epic:   Additional Comments:  Derenda Fennel, LCSW [phone]

## 2013-03-18 NOTE — Telephone Encounter (Signed)
Error

## 2013-03-18 NOTE — Discharge Summary (Addendum)
Physician Discharge Summary  Ricardo Garrett EAV:[id number] DOB: 22-Aug-1946 DOA: 03/14/2013  PCP: Ricardo Pizza, MD  Admit date: 03/14/2013 Discharge date: 03/18/2013  Recommendations for Outpatient Follow-up:  1. Physical therapy for right hip fracture. Toe touch on the right.  2. Remove staples on 10th post op day 5/27 and Steri-strip wound.  3. Follow-up with Dr. Hilda Garrett in the office in one month with x-rays of the right hip prior to the visit.  4. Enoxaparin for one month daily for DVT prophylaxis.   Follow-up Information   Follow up with Ricardo Pizza, MD. Schedule an appointment as soon as possible for a visit in 2 weeks.   Contact information:    [address]  [place] [address] [phone]       Follow up with Ricardo Mclean, MD. Schedule an appointment as soon as possible for a visit in 4 weeks.   Contact information:   [address] [place] [address] [phone]      Discharge Diagnoses:  1. Right hip fracture status post mechanical fall 2. Acute blood loss anemia  Discharge Condition: Improved Disposition: Short-term rehabilitation  Diet recommendation: Regular diet  Filed Weights   03/14/13 1349 03/14/13 1802 03/15/13 1221  Weight: 74.844 kg (165 lb) 81.8 kg (180 lb 5.4 oz) 81.647 kg (180 lb)    History of present illness:  67 year old man presented emergency department after a fall resulting in right hip pain. X-ray revealed right hip fracture.  Hospital Course:  [name] was admitted for definitive fracture repair. He was seen by orthopedics and underwent surgery 5/17. His postoperative course was uncomplicated. Because of persistent pain and difficulty with mobilization skilled nursing facility was recommended. Cleared by orthopedics for discharge.  1. Right hip fracture status post mechanical fall: Status post surgery 5/16. Management per orthopedics. Discussed with Dr. Minda Garrett for discharge. 2. Acute blood loss anemia: Likely  secondary to fracture and surgery. Stable. No further evaluation suggested.  Consultants:  Orthopedics   Procedures:  5/17 ORIF right hip fracture  Discharge Instructions     Medication List    TAKE these medications       atorvastatin 20 MG tablet  Commonly known as:  LIPITOR  Take 20 mg by mouth daily.     enoxaparin 40 MG/0.4ML injection  Commonly known as:  LOVENOX  Inject 0.4 mLs (40 mg total) into the skin daily. Last dose June 17.     HYDROcodone-acetaminophen 5-325 MG per tablet  Commonly known as:  NORCO/VICODIN  Take 1-2 tablets by mouth every 6 (six) hours as needed (moderate pain).       No Known Allergies  The results of significant diagnostics from this hospitalization (including imaging, microbiology, ancillary and laboratory) are listed below for reference.    Significant Diagnostic Studies: Dg Chest 1 View  03/14/2013   *RADIOLOGY REPORT*  Clinical Data: Fall.  Hip fracture  CHEST - 1 VIEW  Comparison: None.  Findings: The lungs are clear.  Negative for heart failure or pneumonia.  Negative for mass lesion.  No pleural effusion.  IMPRESSION: No acute abnormality.   Original Report Authenticated By: Janeece Riggers, M.D.   Dg Hip Complete Right  03/14/2013   *RADIOLOGY REPORT*  Clinical Data: Fall  RIGHT HIP - COMPLETE 2+ VIEW  Comparison: None.  Findings: Intertrochanteric fracture right femur with moderate angulation and comminution.  Right hip joint appears normal.  No other pelvic fractures.  There is arterial calcification in the femoral artery.  IMPRESSION: Angulated intertrochanteric  fracture on the right.   Original Report Authenticated By: Janeece Riggers, M.D.   Dg Hip Operative Right  03/15/2013   *RADIOLOGY REPORT*  Clinical Data: Right hip fixation.  DG OPERATIVE RIGHT HIP  Comparison: 03/14/2013  Findings: 4 intraoperative images.  These demonstrate fixation of the proximal femur with plate and screw device.  Improved alignment with persistent  displacement of comminuted intertrochanteric femur fracture. No acute hardware complication.  Single cerclage wire incidentally noted.  IMPRESSION: Internal fixation of proximal femoral fracture.   Original Report Authenticated By: Jeronimo Greaves, M.D.    Microbiology: Recent Results (from the past 240 hour(s))  SURGICAL PCR SCREEN     Status: None   Collection Time    03/14/13  7:14 PM      Result Value Range Status   MRSA, PCR NEGATIVE  NEGATIVE Final   Staphylococcus aureus NEGATIVE  NEGATIVE Final   Comment:            The Xpert SA Assay (FDA     approved for NASAL specimens     in patients over 48 years of age),     is one component of     a comprehensive surveillance     program.  Test performance has     been validated by [employer] for patients greater     than or equal to 42 year old.     It is not intended     to diagnose infection nor to     guide or monitor treatment.     Labs: Basic Metabolic Panel:  Recent Labs Lab 03/14/13 1540 03/16/13 0650 03/17/13 0638  NA 139 133* 135  K 3.7 3.9 3.8  CL 103 99 102  CO2 24 27 26   GLUCOSE 117* 129* 122*  BUN 15 13 13   CREATININE 1.23 1.20 1.01  CALCIUM 8.8 8.2* 8.1*   Liver Function Tests:  Recent Labs Lab 03/14/13 1540  AST 23  ALT 24  ALKPHOS 78  BILITOT 0.7  PROT 6.2  ALBUMIN 3.6   CBC:  Recent Labs Lab 03/14/13 1540 03/16/13 0650 03/17/13 0638 03/18/13 0800  WBC 16.1* 9.7 10.2 8.8  NEUTROABS 13.9* 7.2 7.8* 6.4  HGB 14.6 10.6* 10.1* 10.2*  HCT 41.2 31.5* 29.4* 30.0*  MCV 88.6 92.9 91.9 91.7  PLT 273 231 223 252    Principal Problem:   Intertrochanteric fracture of right hip Active Problems:   Acute blood loss anemia   Time coordinating discharge: 25 minutes  Signed:  Brendia Sacks, MD [facility] 03/18/2013, 10:45 AM

## 2013-03-18 NOTE — Progress Notes (Signed)
Pt states he does not feel he needs breathing treatment at this time and rates his pain as 4/10. States he would like his pain medicine before his pain gets bad. Given Vicodan per mar. Will continue to monitor.

## 2013-03-19 ENCOUNTER — Non-Acute Institutional Stay (SKILLED_NURSING_FACILITY): Payer: Medicare Other | Admitting: Internal Medicine

## 2013-03-19 DIAGNOSIS — Z7901 Long term (current) use of anticoagulants: Secondary | ICD-10-CM | POA: Insufficient documentation

## 2013-03-19 DIAGNOSIS — E785 Hyperlipidemia, unspecified: Secondary | ICD-10-CM | POA: Insufficient documentation

## 2013-03-19 DIAGNOSIS — I499 Cardiac arrhythmia, unspecified: Secondary | ICD-10-CM

## 2013-03-19 DIAGNOSIS — S72141D Displaced intertrochanteric fracture of right femur, subsequent encounter for closed fracture with routine healing: Secondary | ICD-10-CM

## 2013-03-19 DIAGNOSIS — E871 Hypo-osmolality and hyponatremia: Secondary | ICD-10-CM

## 2013-03-19 DIAGNOSIS — K5900 Constipation, unspecified: Secondary | ICD-10-CM

## 2013-03-19 DIAGNOSIS — D62 Acute posthemorrhagic anemia: Secondary | ICD-10-CM

## 2013-03-19 DIAGNOSIS — S72009D Fracture of unspecified part of neck of unspecified femur, subsequent encounter for closed fracture with routine healing: Secondary | ICD-10-CM

## 2013-03-19 NOTE — Progress Notes (Signed)
Patient ID: Ricardo Garrett, male   DOB: 14-Mar-1946, 67 y.o.   MRN: [id number] This is an acute visit.  [facility]  Level of care skilled . Chief complaint.--Acute visit status post hospitalization for right hip fracture with repair  History of present illness  Patient is a pleasant 67 year old male who sustained a fall at his home and subsequently a right hip fracture-he had surgery on May 16.  Apparently this went unremarkably however he did have some acute blood loss --appeared he did not  require a transfusion and hemoglobin as of yesterday was 10.2.  Patient has quite a benign medical history he is only on a statin.  Also on Lovenox for anticoagulation DVT prophylaxis-he is receiving Norco for pain and this appears to be effective  Currently his only complaint is constipation says he has not had a bowel movement in several days.  He denies any abdominal pain nausea or vomiting.  Previous medical history.  History of right hip fracture with repair.  Hyperlipidemia.  Previous surgical history.  History of tonsillectomy and also skin graft in the past.  Hospital studies.  03/14/2013.  Chest x-ray-showed no acute abnormality.  Right hip x-ray  .   showed angulated intertrochanteric fracture  EKG showed sinus rhythm with inferior lateral T-wave inversion likely repolarization abnormality.  Social history.  Previous history of smoking he no longer smokes-no smokeless tobacco history.  Does drink alcohol and has used marijuana in the past.  He lives with his wife in the [place] area his wife is with him in the room today.  Medications.  Lipitor 20 mg daily.  Lovenox 40 mg injection daily until June 17.  Norco-5-325 mg every 6 hours as needed for pain  .  Review of systems.  Gen. no complaints of fever or chills.  Skin-denies issues.  Head ears eyes nose mouth and throat-no complaints of nasal discharge visual changes or sore  throat.  Respiratory-does not complaining of shortness of breath or cough.  Cardiac-denies any chest pain.  GI-does complain of constipation denies abdominal pain nausea or vomiting.  GU-no complaints of dysuria.  Muscle skeletal-is concerned somewhat with edema of the right hip area but does not complain specifically of pain.  Neurologic-denies any numbness or tingling or headache.  Psych-no significant history here is pleasant upbeat engaged.  Physical exam.  Temperature 98.2 pulse 76 respirations 20 blood pressure 149/80.  In general this is a well-nourished elderly male who looks somewhat younger than his stated age.  Skin is warm and dry surgical site is covered per nursing there is no sign of infection Staples are in place.  Eyes-extraocular movements intact pupils equal round reactive to light.  Oropharynx is clear he has numerous extractions mucous membranes are moist tongue is midline.  Chest is clear to auscultation without rhonchi rales or wheezes no labored breathing.  Heart is regular rate and rhythm with frequent irregular beats ectopic beats-he does have some edema of the right hip area this is not really erythematous or tender or warm to touch that he does not really have much edema of the lower legs bilaterally-- pedal pulse intact although slightly reduced  . Abdomen-is somewhat obese soft nontender with active bowel sounds  Muscle skeletal does ambulate wheelchair moves all extremities at baseline except for right lower extremity status post surgery-he has excellent grip strength bilaterally I do not see any deformities.  Neurologic-is grossly intact speech is clear there are no lateralizing findings-cranial nerves are grossly intact.  Psych he  is alert and oriented x3 pleasant and appropriate.  Labs.  03/18/2013.  WBC 8.8 hemoglobin 10.2 platelets 252.  03/17/2013.  Sodium 135 potassium 3.8 BUN 13 creatinine 1.01.  03/14/2013.  Liver function  tests within normal limits.  Assessment and plan.  #1-right hip fracture status post repair-he appears to be doing well with this-will need PT and OT-patient is very motivated to go home as quickly as possible I suspect his stay here will be relatively short-at this point norco appears effective for pain management.  #2-hyperlipidemia-recent liver function tests within normal limits-he is on Lipitor-will defer aggressive treatment of this to primary care provider secondary to his expected short stay here.  #3-arrhythmia?-I did hear frequent irregular beats on exam today will order an EKG-clinically he certainly does not appear to be unstable-no complaints of chest pain or palpitations.  #4-anticoagulation management-is on Lovenox for DVT prophylaxis.  #5-anemia-thought to be postop blood loss-this appears to be stable we'll update CBC first lab day next week.  #6-hyponatremia-per review of hospital labs I did note occasionally borderline low sodium of 133-Will update metabolic panel next week as well to make sure this is stable.  #7  constipation-will order Dulcolax suppository i---also will add MiraLax daily and monitor  Addendum.  Have obtained the EKG-- shows sinus rhythm with PACs which would explain the occasional irregularity again he appears clinically stable Will monitor for now.  [id number] note 50 minutes spent assessing patient-reviewing hospital records-and coordinating and formulating a plan of care

## 2013-03-20 ENCOUNTER — Non-Acute Institutional Stay (SKILLED_NURSING_FACILITY): Payer: Medicare Other | Admitting: Internal Medicine

## 2013-03-20 DIAGNOSIS — S72009D Fracture of unspecified part of neck of unspecified femur, subsequent encounter for closed fracture with routine healing: Secondary | ICD-10-CM

## 2013-03-20 DIAGNOSIS — E785 Hyperlipidemia, unspecified: Secondary | ICD-10-CM

## 2013-03-20 DIAGNOSIS — R29898 Other symptoms and signs involving the musculoskeletal system: Secondary | ICD-10-CM

## 2013-03-20 DIAGNOSIS — S72141D Displaced intertrochanteric fracture of right femur, subsequent encounter for closed fracture with routine healing: Secondary | ICD-10-CM

## 2013-03-26 ENCOUNTER — Non-Acute Institutional Stay (SKILLED_NURSING_FACILITY): Payer: Medicare Other | Admitting: Internal Medicine

## 2013-03-26 DIAGNOSIS — D62 Acute posthemorrhagic anemia: Secondary | ICD-10-CM

## 2013-03-26 DIAGNOSIS — R739 Hyperglycemia, unspecified: Secondary | ICD-10-CM

## 2013-03-26 DIAGNOSIS — L309 Dermatitis, unspecified: Secondary | ICD-10-CM | POA: Insufficient documentation

## 2013-03-26 DIAGNOSIS — R7309 Other abnormal glucose: Secondary | ICD-10-CM

## 2013-03-26 DIAGNOSIS — L259 Unspecified contact dermatitis, unspecified cause: Secondary | ICD-10-CM

## 2013-03-26 NOTE — Progress Notes (Signed)
Patient ID: Ricardo Garrett, male   DOB: 06-Jun-1946, 67 y.o.   MRN: [id number] This is an acute visit.  Facility [facility].  [facility] care skilled.    Chief complaint-acute visit secondary to anemia-rash.  History of present illness.  Patient is a pleasant elderly resident here for rehabilitation after sustaining a right hip fracture with repair.  He does continue on Lovenox for anticoagulation actually he is on minimal medications.  He does have a history of acute blood loss anemia.  This was thought secondary to his surgery.  Hemoglobin on discharge from the hospital was around 10.2-we have done an updated CBC which shows it's gone down a bit to 9.7-this is a normocytic anemia.  Patient also has developed an itchy rash on his back.  Family medical social history has been reviewed  per progress note of 03/19/2013.  Occasions have been reviewed per [facility].  Review of systems  General denies fever chills.  Skin-does complain of an itchy rash on his back.  Respiratory no complaint of shortness of breath or cough.  Cardiac-no complaints of chest pain.  GI-does not complaining of nausea vomiting diarrhea occasional constipation but he is having regular BMs.  Muscle skeletal-does not complaining of joint pain today he does have Norco for pain.  Neurologic-no complaints of headache or dizziness.  Physical exam.  Temperature is 98.4 pulse 68 respirations 20 blood pressure 125/71.  General this is a pleasant elderly male who looks somewhat younger than his stated age.  His skin is warm and dry-on the back there is a diffuse macular papular rash-there is no drainage bleeding or sign of cellulitis there.  Chest is clear to auscultation without rhonchi rales or wheezes no labored breathing.  Heart regular rate and rhythm without murmur gallop or rub.  Abdomen is soft nontender with positive bowel sounds.  Rectal-did note a small hemorrhoid external-digital exam -- minimal sample but  this appeared to be negative for occult blood  Muscle skeletal he does have Steri-Strips over her surgical site right hip this appears to be benign appearing without any drainage or bleeding-moves all extremities at baseline again limited right leg secondary to the surgery.  Neurologic is grossly intact no lateralizing findings.  Psych he is alert and oriented x3 pleasant and appropriate.  Labs.  03/25/2013.  WBC 9.5 hemoglobin 9.7 platelets 451.  Sodium 133 potassium 4.4 BUN 22 creatinine 0.98.--Glucose 114  Assessment and plan.  #1-anemia-will order a occult blood testing of stools x3-also will add iron 325 mg QD and recheck a CBC in one week  . #2-rash/dermatitis-suspect this could be fungal Will treat with Nizoral cream twice a day until resolved if no resolution notify provider  #3-hyperglycemia?-Will check a fasting glucose.--Per chart review it appears glucose is on the metabolic panels in the hospital were in the 117 to 120s  CPT --99309-of note 30 minutes spent assessing patient-reviewing hospital records-and formulating plan of care for numerous diagnoses

## 2013-04-01 NOTE — Progress Notes (Signed)
Patient ID: Ricardo Garrett, male   DOB: [birth year], 67 y.o.   MRN: [id number]           HISTORY & PHYSICAL  DATE:  03/20/2013  FACILITY: [facility]   LEVEL OF CARE:   SNF   CHIEF COMPLAINT:  Status post admission to [facility], 03/14/2013 through 03/18/2013.   HISTORY OF PRESENT ILLNESS:  This is a gentleman who was working on his own home.  He fell off some stairs, falling roughly 3-4 feet, ended up fracturing his right hip.    He underwent ORIF by Dr. Hilda Lias.  The surgery went well.  He is a primary patient of Dr. Dwana Melena.  His only other medical issue appears to be hyperlipidemia.    The patient did not have any major postoperative problems.  He is here for rehabilitation.  He is on Lovenox for DVT prophylaxis for the next month.  I think we can probably substitute Xarelto for this with equal efficacy and less cost.    PAST MEDICAL HISTORY:  Hyperlipidemia.    He does not have a history of falls.    SOCIAL HISTORY: HOUSING:  He lives with his wife.   FUNCTIONAL STATUS:  Obviously still very active in home repair, etc.   TOBACCO USE:  Nonsmoker.    REVIEW OF SYSTEMS:   CHEST/RESPIRATORY:  No shortness of breath.  CARDIAC:   No chest pain.  GI:  No nausea or vomiting.   NEUROLOGICAL:   He complains to me of right leg weakness.    PHYSICAL EXAMINATION:   GENERAL APPEARANCE:  Very healthy-appearing man in no distress.  CHEST/RESPIRATORY:  Clear air entry bilaterally.     CARDIOVASCULAR:  CARDIAC:   Heart sounds are normal.  There are no murmurs.   GASTROINTESTINAL:  ABDOMEN:   Soft.   LIVER/SPLEEN/KIDNEYS:  No liver, no spleen.  No tenderness.   MUSCULOSKELETAL:   EXTREMITIES:   RIGHT LOWER EXTREMITY:   Incision looks fine.  There is no swelling, no erythema, no evidence of infection.  Staples are still in.  There is no edema noted.  No evidence of a DVT.   NEUROLOGICAL:   SENSATION/STRENGTH:  The patient appeared to have some difficulty in extension of the  right knee.  This was in the wheelchair.  When I laid him down, he appeared to do better.  The sensory exam is normal.   DEEP TENDON REFLEXES:  Reflexes maintained at the right knee jerk.   This all might be a pain issue.     ASSESSMENT/PLAN:  Status post right hip fracture after a fall, roughly 3-4 feet.  No real indication of a metabolic disorder.  He does not fall.    Right leg weakness.  I had a lot of trouble separating this out from pain.  I will follow this clinically next week.  I spoke to Physical Therapy.  They assure me that they feel the knee extension works normally in their review.  He walked 15 feet with partial weightbearing yesterday.    Hyperlipidemia.  On a statin.  I do not think this is going to be an issue here.    CPT CODE: 78295

## 2013-04-04 ENCOUNTER — Non-Acute Institutional Stay (SKILLED_NURSING_FACILITY): Payer: Medicare Other | Admitting: Internal Medicine

## 2013-04-04 DIAGNOSIS — D649 Anemia, unspecified: Secondary | ICD-10-CM

## 2013-04-04 NOTE — Progress Notes (Signed)
Patient ID: Ricardo Garrett, male   DOB: 03-01-1946, 67 y.o.   MRN: [id number] This is an acute visit.  Facility [facility].  [facility] care skilled.   Chief complaint-acute visit secondary to anemia-   History of present illness.  Patient is a pleasant elderly resident here for rehabilitation after sustaining a right hip fracture with repair.  He does continue on Lovenox for anticoagulation actually he is on minimal medications.  He does have a history of acute blood loss anemia.  This was thought secondary to his surgery.  Hemoglobin on discharge from the hospital was around 10.2-we did an updated CBC which showed iti gone down a bit to 9.7-thiswass a normocytic anemia--- we ordered occult testing x3-so far there has been one negative and now 1 positive --I do note the updated CBC earlier this week showed hemoglobin had improved to 11.1  Patient denies any history of GI bleed he does say he has a history of hemorrhoids which bleed occasionally.   Continues on Lovenox for DVT prophylaxis status post surgery  .  Family medical social history has been reviewed per progress note of 03/19/2013 .  Medication have been reviewed per [facility] .  Review of systems  General denies fever chills.  Skin-did have a rash on his back this appears resolved.  Respiratory no complaint of shortness of breath or cough.  Cardiac-no complaints of chest pain.  GI-does not complaining of nausea vomiting diarrhea occasional constipation but he is having regular BMs.  Muscle skeletal-does not complaining of joint pain today he does have Norco for pain.  Neurologic-no complaints of headache or dizziness.   Physical exam.  Temperature 98.3 pulse 67 respirations 22 blood pressure 128/78.  General this is a pleasant elderly male who looks somewhat younger than his stated age.  His skin is warm and dry- .  Chest is clear to auscultation without rhonchi rales or wheezes no labored breathing.  Heart regular rate and rhythm  without murmur gallop or rub.  Abdomen is soft nontender with positive bowel sounds.  Rectal-did note a fairly large l hemorrhoid external-digital exam -- no active bleeding but did appear to have some areas capable of bleeding during any kind of trauma--BM  Muscle skeletalr surgical site right hip--Steri-Strips have been removed this appears to be healing no drainage bleeding or erythema---y.  Neurologic is grossly intact no lateralizing findings.  Psych he is alert and oriented x3 pleasant and appropriate .  Labs.  04/01/2013.  WBC 9.3 hemoglobin 11.1 platelets 716.  Fasting glucose 107 03/25/2013.  WBC 9.5 hemoglobin 9.7 platelets 451.  Sodium 133 potassium 4.4 BUN 22 creatinine 0.98.--Glucose 114   Assessment and plan.  #1-anemia-he has been started on iron and hemoglobin is improving-he does have a history of hemorrhoids which could very well be the source of occult blood positivity-consider GI consult although this could very well be caused by the hemorrhoids  HBG looks improved will update CBC next week--also consider starting PPI--Will discuss with Dr. Leanord Hawking  CPT [phone]

## 2013-04-17 ENCOUNTER — Ambulatory Visit (HOSPITAL_COMMUNITY)
Admit: 2013-04-17 | Discharge: 2013-04-17 | Disposition: A | Discharge status: Home / Self Care | Payer: Medicare Other | Attending: Internal Medicine | Admitting: Internal Medicine

## 2013-04-17 DIAGNOSIS — X58XXXA Exposure to other specified factors, initial encounter: Secondary | ICD-10-CM | POA: Insufficient documentation

## 2013-04-17 DIAGNOSIS — S72143A Displaced intertrochanteric fracture of unspecified femur, initial encounter for closed fracture: Secondary | ICD-10-CM | POA: Insufficient documentation

## 2013-04-23 ENCOUNTER — Non-Acute Institutional Stay (SKILLED_NURSING_FACILITY): Payer: Medicare Other | Admitting: Internal Medicine

## 2013-04-23 DIAGNOSIS — D62 Acute posthemorrhagic anemia: Secondary | ICD-10-CM

## 2013-04-23 NOTE — Progress Notes (Signed)
Patient ID: Ricardo Garrett, male   DOB: 1946/05/22, 67 y.o.   MRN: [id number]   This is an acute visit.  Level  of care skilled.  Facility [facility].  Chief complaint-acute visit followup anemia-question rectal bleeding.  History of present illness.  Patient is a pleasant 67 year old male here for rehabilitation after sustaining a right hip fracture with repair.  This appears to be going quite well he is ambulating well with a walker.  Actually plans are for discharge later this week.  Initially we did note a slight drop in his hemoglobin which appears to have brought back up somewhat-he has a listed history of acute blood loss anemia thought secondary to the surgery He also apparently has a history of a bleeding hemorrhoid at times this was thought possibly the cause of one  occultt positive stool --- his hemoglobin was stable  He was started on iron as well  However today  patient told nursing staff he had some blood on his toilet tissue-after extensive discussion with him he says this is not really new and has been going on for some time intermittently and then resolves   He denies any increased shortness of breath dizziness syncopal-type feelings-.  His most recent hemoglobin on June 16 was 11.7 this was essentially unchanged over the past 3 weeks-the lowest hemoglobin ever got was 9.7 on May 26-again iron was started thereafter  Medical social history has been reviewed per history and physical on 03/20/2013  Medications have been reviewed per [facility].  Review of systems.  In general denies fever or chills.  Respiratory-does not complaining of shortness of breath.  Cardiac-does not complaining of chest pain.  GI-says he's having regular bowel movements denies any abdominal discomfort nausea or vomiting.  Muscle skeletal-he is doing well with therapy does not complaining of joint pain today.  Neurologic-does not complaining of headaches dizziness says once in a while he'll feel  dizzy during therapy apparently this is not new----not frequent  Psych-denies issues appears to be in good spirits.  Physical exam.  Temperature is 98.3 pulse 61 respirations 20 blood pressure 131/73.  In general this is a well-nourished the male in no distress lying comfortably in bed.  His skin is warm and dry.  Chest is clear to auscultation without rhonchi rales or wheezes.  Heart is regular rate and rhythm with frequent ectopic beats-he has minimal lower extremity edema.  Abdomen-is soft nontender with positive bowel sounds.  Rectal-I did attempt a guaiac test however there was minimal sample-it appeared there could be some positivity on the very small sample I was able to obtain--I did note a large hemorrhoid external- did not appear to be actively bleeding  Muscle skeletal-is  able to ambulate with walker well healed surgical scar right hip this appears to be progressing well-I did not note any deformity.  Neurologic is grossly intact no focal deficits appreciated speech is clear.  Psych he is alert and oriented x3 pleasant and appropriate.  Labs.  04/15/2013.  WBC 9.0 hemoglobin 11.7 platelets 326.  04/08/2013.  WBC 7.2 hemoglobin 12.1 platelets 504.  03/25/2013.  WBC 9.5 hemoglobin 9.7 platelets 451.  Sodium 133 potassium 4.4 BUN 22 creatinine 0.98  Assessment and plan.  1-anemia-with question rectal bleeding-I did have an extensive discussion with patient at bedside-I did highly recommend a GI consult-stating the cause could be benign but certainly would warrant GI followup most likely a colonoscopy and if there was anything serious-an expedient diagnosis would certainly be in his best  interest.  He expressed understanding and says he will think about it-but does not want to have a GI consult arranged right now-will readdress before discharge.  Also will obtain updated CBC and basic metabolic panel a.m. Tomorrow.--Also will order another round of stool  guaics Did discuss adding a proton pump inhibitor-patient does not want one at this point saying he wants to minimize his medications-I did discuss the iron and at this point he did agree to continue taking that  \ Clinically he does appear to be stable although again I expressed my concerns for expedient follow up   here  [id number]-of note 50 minutes spent assessing patient-an extensive discussion at bedside about his concerns  and patient education-and formulating a plan of care

## 2013-04-25 ENCOUNTER — Non-Acute Institutional Stay (SKILLED_NURSING_FACILITY): Payer: Medicare Other | Admitting: Internal Medicine

## 2013-04-25 DIAGNOSIS — E785 Hyperlipidemia, unspecified: Secondary | ICD-10-CM

## 2013-04-25 DIAGNOSIS — D62 Acute posthemorrhagic anemia: Secondary | ICD-10-CM

## 2013-04-25 DIAGNOSIS — S72009D Fracture of unspecified part of neck of unspecified femur, subsequent encounter for closed fracture with routine healing: Secondary | ICD-10-CM

## 2013-04-25 DIAGNOSIS — S72141D Displaced intertrochanteric fracture of right femur, subsequent encounter for closed fracture with routine healing: Secondary | ICD-10-CM

## 2013-04-25 NOTE — Progress Notes (Signed)
Patient ID: Ricardo Garrett, male   DOB: Mar 06, 1946, 67 y.o.   MRN: [id number] This is an acute visit.  Facility [facility]  Level of care skilled  .  Chief complaint.--Discharge note   History of present illness  Patient is a pleasant 67 year old male who sustained a fall at his home and subsequently a right hip fracture-he had surgery on May 16.  Apparently this went unremarkably however he did have some acute blood loss --appeared he did not require a transfusion--hemoglobin on discharge was 10.2 this dipped slightly to 9.7 during his stay here-he was started on iron and hemoglobin has risen to 12.9.  We also ordered a occultl blood testing of the stool one actually came back positive and he has reported at times some history of blood on the tissue after wiping-however he does not want aggressive workup of that  Now-- will defer to his primary care provider.  Patient has quite a benign medical history He does have a history of hyperlipidemia and continues on statin He is now ambulating quite well with a walker and will be going home with his wife-[name].  .      .  Previous medical history.  History of right hip fracture with repair.  Hyperlipidemia.   Previous surgical history.  History of tonsillectomy and also skin graft in the past.   Hospital studies .  03/14/2013.  Chest x-ray-showed no acute abnormality.  Right hip x-ray  .  showed angulated intertrochanteric fracture  .  Social history.  Previous history of smoking he no longer smokes-no smokeless tobacco history.  Does drink alcohol and has used marijuana in the past.  [name]  .  Medications--reviewed per [facility]    .  Review of systems.  Gen. no complaints of fever or chills.  Skin-denies issues.  Head ears eyes nose mouth and throat-no complaints of nasal discharge visual changes or sore throat.  Respiratory-does not complaining of shortness of breath  or cough.   Cardiac-denies any chest pain.  GI-does complain of constipation denies abdominal pain nausea or vomiting.  GU-no complaints of dysuria.  Muscle skeletal--says occasionally he will have some right hip pain the Norco appears effective however.  Neurologic-denies any numbness or tingling or headache.  Psych-no significant history here is pleasant upbeat engaged .  Physical exam. Temperature 97.8 pulse 66 respirations 20 blood pressure 140/85-125/73-generally in this range-weight is 163 this has been stable the past 2 weeks after  initial loss of 5 pounds  In general this is a well-nourished elderly male who looks somewhat younger than his stated age.  Skin is warm and dry surgical site of the right hip appears well healed  Eyes-extraocular movements intact pupils equal round reactive to light.  Oropharynx is clear he has numerous extractions mucous membranes are moist tongue is midline.  Chest is clear to auscultation without rhonchi rales or wheezes no labored breathing.  Heart is regular rate and rhythm with occasional irregular beats ectopic beats   . Abdomen-is somewhat obese soft nontender with active bowel sounds  Muscle skeletal does ambulate with walker moves all extremities at baseline  I d.  Neurologic-is grossly intact speech is clear there are no lateralizing findings-cranial nerves are grossly intact.  Psych he is alert and oriented x3 pleasant and appropriate .  Labs.  04/24/2013.  WBC 7.3 hemoglobin 12.9 platelets 298.  Sodium 137 potassium 4.4 BUN 19 creatinine 1.10.  04/15/2013.  WBC 9.0 hemoglobin 11.7 platelets 326.    03/18/2013.  WBC 8.8 hemoglobin 10.2 platelets 252.  03/17/2013.  Sodium 135 potassium 3.8 BUN 13 creatinine 1.01.  03/14/2013.  Liver function tests within normal limits.   Assessment and plan.  #1-right hip fracture status post repair-he appears to be doing well will need continued PT and OT at home-he does have a supportive  wife who is here frequently-   #2-hyperlipidemia-recent liver function tests within normal limits-he is on Lipitor-will defer aggressive treatment of this to primary care provider secondary to his  short stay here.  #3-arrhythmia?-During exams  are somewhat irregular beats at times EKG did show sinus rhythm with some  occasional PACs-clinically completely asymptomatic will defer any followup to primary care provider  .   #4-anemia-thought to be postop blood loss-this appears to be stable -hemoglobin has risen with iron-some possible  history of occult positive stool here patient does not want aggressive workup at this time-will defer to primary care provider   #5-some history of hyponatremia in hospital-this appears resolved per recent lab  [id number]                                               Level of Service

## 2013-04-29 ENCOUNTER — Ambulatory Visit (HOSPITAL_COMMUNITY)
Admit: 2013-04-29 | Discharge: 2013-04-29 | Disposition: A | Discharge status: Home / Self Care | Payer: Medicare Other | Source: Ambulatory Visit | Attending: Internal Medicine | Admitting: Internal Medicine

## 2013-04-29 DIAGNOSIS — M25559 Pain in unspecified hip: Secondary | ICD-10-CM | POA: Insufficient documentation

## 2013-04-29 DIAGNOSIS — IMO0001 Reserved for inherently not codable concepts without codable children: Secondary | ICD-10-CM | POA: Insufficient documentation

## 2013-04-29 DIAGNOSIS — R262 Difficulty in walking, not elsewhere classified: Secondary | ICD-10-CM | POA: Insufficient documentation

## 2013-04-29 NOTE — Evaluation (Signed)
Physical Therapy Evaluation  Patient Details  Name: Ricardo Garrett MRN: [id number] Date of Birth: Mar 22, 1946  Today's Date: 04/29/2013 Time: 4098-1191 PT Time Calculation (min): 57 min Charges: 1 evaluation  TE: 1725-1735             Visit#: 1 of 8  Re-eval: 05/29/13 Assessment Diagnosis: rt hip fracture  Next MD Visit: Dr. Hilda Lias 05/17/13 Prior Therapy: SNF  Past Medical History:  Past Medical History  Diagnosis Date  . High cholesterol   . Broken arm    Past Surgical History:  Past Surgical History  Procedure Laterality Date  . Tonsillectomy    . Skin graft    . Compression hip screw Right 03/15/2013    Procedure: COMPRESSION HIP;  Surgeon: Darreld Mclean, MD;  Location: [facility];  Service: Orthopedics;  Laterality: Right;    Subjective Symptoms/Limitations Pertinent History: Pt is a 67 year old male referred to PT s/p Rt hip ORIF from a fall on 03/14/13 when working at his home.  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  How long can you stand comfortably?: 10-15 minutes How long can you walk comfortably?: 5-10 minutes Patient Stated Goals: I want to be able to walk  Pain Assessment Currently in Pain?: Yes Pain Score:   2 Pain Location: Hip Pain Orientation: Right Pain Type: Surgical pain;Acute pain Pain Relieving Factors: pain medication Effect of Pain on Daily Activities: unable to return to work.   Precautions/Restrictions  Precautions Precautions: Fall Restrictions Weight Bearing Restrictions: Yes RLE Weight Bearing: Partial weight bearing RLE Partial Weight Bearing Percentage or Pounds: 50%  Balance Screening Balance Screen Has the patient fallen in the past 6 months: Yes How many times?: 1 Has the patient had a decrease in activity level because of  a fear of falling? : Yes Is the patient reluctant to leave their home because of a fear of falling? : No  Prior Functioning  Prior Function Driving: Yes Vocation: Full time employment Vocation Requirements: [employer].  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, kneel, remodling, tile,  Comments: Enjoy taking the dog for a walk  Sensation/Coordination/Flexibility/Functional Tests Coordination Gross Motor Movements are Fluid and Coordinated: No Coordination and Movement Description: impaired to Rt gluteus medius and proximal quadricep Functional Tests Functional Tests: Lower Extremity Functional Scale (LEFS) 29/80  Assessment RLE Strength Right Hip Flexion: 2+/5 Right Hip Extension: 3/5 Right Hip ABduction: 2+/5 Right Hip ADduction: 3+/5 Right Knee Flexion: 3/5 Right Knee Extension: 4/5 Palpation Palpation: pain and tenderness to Lt groin and hip region  Mobility/Balance  Ambulation/Gait Ambulation/Gait: Yes Assistive device: Rolling walker Gait Pattern: Decreased hip/knee flexion - right;Antalgic;Decreased stance time - right   Exercise/Treatments Supine Short Arc Quad Sets: Right;5 reps (5 sec holds) Bridges: 10 reps Sidelying Hip ABduction: AAROM;5 reps Hip ADduction: 5 reps Clams: VC and TC for gluteus medius activation 10 reps Prone  Hamstring Curl: 15 reps Hip Extension: 5 reps    Physical Therapy Assessment and Plan PT Assessment and Plan Clinical Impression Statement: Pt is a 67 year old male referred to PT s/p Rt hip ORIF with impairments listed below.  At this time was referred to OP PT from SNF MD.  Will contact surgical MD (Dr. Hilda Lias) to discuss WB restrictions and limiations.  Pt will benefit from skilled  therapeutic intervention in order to improve on the following deficits: Decreased activity tolerance;Decreased balance;Decreased coordination;Difficulty walking;Pain;Decreased strength;Decreased mobility Rehab Potential: Good PT Frequency: Min 2X/week PT  Duration: 6 weeks PT Treatment/Interventions: DME instruction;Stair training;Gait training;Functional mobility training;Therapeutic activities;Therapeutic exercise;Balance training;Patient/family education;Manual techniques;Modalities PT Plan: Continue with 50% weight bearing activities, until otherwise advised by MD.  Standing knee flexion with weight, seated heel and toe raises, 4 way SLR, clams, SAQ.  Progress to core exercises to prepare for full weight bearing to improve balance.  Manual techniques for pain control     Goals Home Exercise Program Pt/caregiver will Perform Home Exercise Program: independently;with written HEP provided PT Goal: Perform Home Exercise Program - Progress: Goal set today PT Short Term Goals Time to Complete Short Term Goals: 2 weeks PT Short Term Goal 1: Pt will improve LE strength by 1 muscle grade to prepare for full weight bearing with walking.  PT Short Term Goal 2: Pt will ambualte with approprriate gait mechanics with 50% weight bearing to decrease risk of secondary injury.  PT Long Term Goals Time to Complete Long Term Goals:  (6 weeks) PT Long Term Goal 1: Pt will improve his BLE to [facility] in order to go from half kneel to stand with UE assistance to return to work activities.  PT Long Term Goal 2: Pt will improve his activity tolerance and ambulate with LRAD x15 minutes in indoor and outdoor surfaces.  Long Term Goal 3: Pt will improve his LEFS to 40/80 for improved QOL.   Problem List Patient Active Problem List   Diagnosis Date Noted  . Hip pain 04/29/2013  . Difficulty in walking(719.7) 04/29/2013  . Dermatitis 03/26/2013  . Other and unspecified hyperlipidemia 03/19/2013  . Unspecified constipation 03/19/2013  . Arrhythmia 03/19/2013  . Hyponatremia 03/19/2013  . Anticoagulated 03/19/2013  . Acute blood loss anemia 03/16/2013  . Intertrochanteric fracture of right hip 03/14/2013   PT Plan of Care PT Home Exercise Plan: see scanned report PT  Patient Instructions: discussed HEP, answered questions regarding diagnosis, pt requested to see x-ray of ORIF and showed image for education.  Consulted and Agree with Plan of Care: Patient;Family member/caregiver Family Member Consulted: stephanie (wife)  Annett Fabian, MPT ATC 04/29/2013, 6:15 PM  Physician Documentation Your signature is required to indicate approval of the treatment plan as stated above.  Please sign and either send electronically or make a copy of this report for your files and return this physician signed original.   Please mark one 1.__approve of plan  2. ___approve of plan with the following conditions.   ______________________________                                                          _____________________ Physician Signature  Date  

## 2013-05-01 ENCOUNTER — Ambulatory Visit (HOSPITAL_COMMUNITY)
Admission: RE | Admit: 2013-05-01 | Discharge: 2013-05-01 | Disposition: A | Discharge status: Home / Self Care | Payer: Medicare Other | Source: Ambulatory Visit | Attending: Internal Medicine | Admitting: Internal Medicine

## 2013-05-01 DIAGNOSIS — R262 Difficulty in walking, not elsewhere classified: Secondary | ICD-10-CM | POA: Insufficient documentation

## 2013-05-01 DIAGNOSIS — M25559 Pain in unspecified hip: Secondary | ICD-10-CM | POA: Insufficient documentation

## 2013-05-01 DIAGNOSIS — IMO0001 Reserved for inherently not codable concepts without codable children: Secondary | ICD-10-CM | POA: Insufficient documentation

## 2013-05-01 NOTE — Progress Notes (Signed)
Physical Therapy Treatment Patient Details  Name: Ricardo Garrett MRN: [id number] Date of Birth: 1946-03-14  Today's Date: 05/01/2013 Time: 4098-1191 PT Time Calculation (min): 46 min Charge TE [id number]  Visit#: 2 of 8  Re-eval: 05/29/13 Assessment Diagnosis: rt hip fracture  Next MD Visit: Dr. Hilda Lias 05/17/13 Prior Therapy: SNF  Subjective: Symptoms/Limitations Symptoms: Pt reports compliance with HEP, has biggest difficutly with abduction exercise. Pain Assessment Currently in Pain?: Yes Pain Score: 2  Pain Location: Hip Pain Orientation: Right  Precautions/Restrictions  Precautions Precautions: Fall Restrictions Weight Bearing Restrictions: Yes RLE Weight Bearing: Partial weight bearing RLE Partial Weight Bearing Percentage or Pounds: 50%  Exercise/Treatments Standing Knee Flexion: 10 reps Supine Short Arc Quad Sets: AROM;Right;20 reps Bridges: 10 reps Straight Leg Raises: AAROM;Right;10 reps Other Supine Knee Exercises: isometric hip flexion 10x5" Sidelying Hip ABduction: AAROM;10 reps Clams: VC and TC for gluteus medius activation 10 reps Prone  Hamstring Curl: 15 reps Hip Extension: 10 reps      Physical Therapy Assessment and Plan PT Assessment and Plan Clinical Impression Statement: Amy Fraxier, PTA started treatment.  Reviewed weight bearing restrictions per MD with gait, pt able to demonstrate appropriate WB with RW,  Pt able to complete appropriate technique with all exercises following cueing, noted visible musculature fatigue especially with supine SLR and S/L abduction (required AAROM).  Pt encouraged to ice hip for pain and edema control. PT Plan: Continue with 50% weight bearing activities, until otherwise advised by MD.  Continue with strengthening therex for Rt hip.  Resume adduction next session.  Progress to core exercises to prepare for full weight bearing to improve balance.  Manual techniques for pain control     Goals    Problem  List Patient Active Problem List   Diagnosis Date Noted  . Hip pain 04/29/2013  . Difficulty in walking(719.7) 04/29/2013  . Dermatitis 03/26/2013  . Other and unspecified hyperlipidemia 03/19/2013  . Unspecified constipation 03/19/2013  . Arrhythmia 03/19/2013  . Hyponatremia 03/19/2013  . Anticoagulated 03/19/2013  . Acute blood loss anemia 03/16/2013  . Intertrochanteric fracture of right hip 03/14/2013    PT - End of Session Activity Tolerance: Patient tolerated treatment well General Behavior During Therapy: [facility] for tasks assessed/performed  GP    Juel Burrow 05/01/2013, 5:00 PM

## 2013-05-06 ENCOUNTER — Ambulatory Visit (HOSPITAL_COMMUNITY)
Admission: RE | Admit: 2013-05-06 | Discharge: 2013-05-06 | Disposition: A | Discharge status: Home / Self Care | Payer: Medicare Other | Source: Ambulatory Visit | Attending: Internal Medicine | Admitting: Internal Medicine

## 2013-05-06 DIAGNOSIS — R262 Difficulty in walking, not elsewhere classified: Secondary | ICD-10-CM

## 2013-05-06 DIAGNOSIS — M25551 Pain in right hip: Secondary | ICD-10-CM

## 2013-05-06 NOTE — Progress Notes (Signed)
Physical Therapy Treatment Patient Details  Name: Ricardo Garrett MRN: [id number] Date of Birth: [birth year]  Today's Date: 05/06/2013 Time: 1008-1059 PT Time Calculation (min): 51 min Charges: TE: 1008-1050, ManualL 1050-1059 Visit#: 3 of 8  Re-eval: 05/29/13 Assessment Diagnosis: rt hip fracture  Next MD Visit: Dr. Hilda Lias 05/17/13  Authorization:   [employer] Medicare Authorization Time Period:    Authorization Visit#:3 of 8  Subjective: Symptoms/Limitations Symptoms: I had to take two pain medications this morning.  I am doing my exercises, both of my legs just feel so weak.  Pain Assessment Currently in Pain?: Yes Pain Score: 2   Precautions/Restrictions  Precautions Precautions: Fall  Exercise/Treatments Machines for Strengthening Cybex Knee Extension: 1 PL RLE 2x10 Cybex Knee Flexion: 2 PL RLE 2x10 Supine Short Arc Quad Sets: 15 reps;Limitations;Right;Left Short Arc Quad Sets Limitations: 3# 3-5 sec holds Straight Leg Raises: AROM;15 reps;Limitations;Right;Left Other Supine Knee Exercises: right; isometric hip flexion 10x10"; Hip abduction green t-band x15 reps Other Supine Knee Exercises: Theraball: bridges 10 reps 5 sec holds; roll ups x5 with Assistance   Manual Therapy Manual Therapy: Myofascial release Myofascial Release: Prone: Medial distal hamstring and adductor to decrease fascial restrictions and pain with strain-counter strain x2 and soft tissue massage.  Educated wife of soft tissue massage.   Physical Therapy Assessment and Plan PT Assessment and Plan Clinical Impression Statement: Increased weight and reps today and added LLE exercises when needed to improve strength and power to BLE to prepare for full weight bearing to decrease risk of falls. Continues to have notable weakness to Rt hip musculature. Significant decrease in pain and fascial restrictions after manual therapy.  PT Plan: Continue with 50% weight bearing activities, until otherwise advised by  MD.  Add NuStep next visit.  Contiue with RLE hip and knee strengthening.  manual to Rt LE to control pain PRN.     Goals Home Exercise Program Pt/caregiver will Perform Home Exercise Program: independently;with written HEP provided PT Goal: Perform Home Exercise Program - Progress: Met PT Short Term Goals Time to Complete Short Term Goals: 2 weeks PT Short Term Goal 1: Pt will improve LE strength by 1 muscle grade to prepare for full weight bearing with walking.  PT Short Term Goal 2: Pt will ambualte with approprriate gait mechanics with 50% weight bearing to decrease risk of secondary injury.  PT Long Term Goals Time to Complete Long Term Goals:  (6 weeks) PT Long Term Goal 1: Pt will improve his BLE to [facility] in order to go from half kneel to stand with UE assistance to return to work activities.  PT Long Term Goal 2: Pt will improve his activity tolerance and ambulate with LRAD x15 minutes in indoor and outdoor surfaces.  Long Term Goal 3: Pt will improve his LEFS to 40/80 for improved QOL.   Problem List Patient Active Problem List   Diagnosis Date Noted  . Hip pain 04/29/2013  . Difficulty in walking(719.7) 04/29/2013  . Dermatitis 03/26/2013  . Other and unspecified hyperlipidemia 03/19/2013  . Unspecified constipation 03/19/2013  . Arrhythmia 03/19/2013  . Hyponatremia 03/19/2013  . Anticoagulated 03/19/2013  . Acute blood loss anemia 03/16/2013  . Intertrochanteric fracture of right hip 03/14/2013    PT - End of Session Activity Tolerance: Patient tolerated treatment well General Behavior During Therapy: [facility] for tasks assessed/performed PT Plan of Care PT Home Exercise Plan: provided green t-band PT Patient Instructions: teach back for gait mechanics and manual therapy with wife.  Consulted and Agree  with Plan of Care: Patient;Family member/caregiver Family Member Consulted: stephanie (wife)  GP    Eastyn Skalla 05/06/2013, 11:01 AM

## 2013-05-09 ENCOUNTER — Ambulatory Visit (HOSPITAL_COMMUNITY)
Admission: RE | Admit: 2013-05-09 | Discharge: 2013-05-09 | Disposition: A | Discharge status: Home / Self Care | Payer: Medicare Other | Source: Ambulatory Visit | Attending: Internal Medicine | Admitting: Internal Medicine

## 2013-05-09 NOTE — Progress Notes (Signed)
Physical Therapy Treatment Patient Details  Name: Ricardo Garrett MRN: [id number] Date of Birth: November 28, 1945  Today's Date: 05/09/2013 Time: 1104-1200 PT Time Calculation (min): 56 min  Visit#: 4 of 8  Re-eval: 05/29/13 Authorization: BCBS Medicare  Authorization Visit#: 4 of 8  Charges:  therex 40' 1105-1145, manual 10' 1146-1156  Subjective: Symptoms/Limitations Symptoms: Pt states he doesnt have pain when he takes his pain meds.  States pain is less than 5 before taking morning before meds.  Reports manual techniques performed last visit helped decrease pain/soreness in Rt hip.   Pain Assessment Currently in Pain?: No/denies   Exercise/Treatments Aerobic Stationary Bike: NuStep 8' level 2 Machines for Strengthening Cybex Knee Extension: 1 PL RLE 2x10 Cybex Knee Flexion: 2 PL RLE 2x10 Supine Short Arc Quad Sets: 15 reps;Limitations;Right;Left Short Arc Quad Sets Limitations: 3# Straight Leg Raises: Right;10 reps Other Supine Knee Exercises: right; isometric hip flexion 10x10"; Hip abduction green t-band x15 reps Other Supine Knee Exercises: Theraball: bridges 10 reps 5 sec holds; roll ups x5 with Assistance Sidelying Hip ABduction: AAROM;10 reps Clams: VC and TC for gluteus medius activation 10 reps Other Sidelying Knee Exercises: .   Manual Therapy Manual Therapy: Myofascial release Myofascial Release: Supine Rt hip abductor and around scar to decrease fascial restrictions and scar tissue  Physical Therapy Assessment and Plan PT Assessment and Plan Clinical Impression Statement: Added nustep to increase activity tolerance without difficulty.  Able to complete all activites/exercises without pain or complaint. REquired VC's to perform cybex machine slower/controlled to work on eccentric strength. PT Plan: Continue with 50% weight bearing activities, until otherwise advised by MD.  Contiue with RLE hip and knee strengthening.  manual to Rt LE to control pain PRN.       Problem List Patient Active Problem List   Diagnosis Date Noted  . Hip pain 04/29/2013  . Difficulty in walking(719.7) 04/29/2013  . Dermatitis 03/26/2013  . Other and unspecified hyperlipidemia 03/19/2013  . Unspecified constipation 03/19/2013  . Arrhythmia 03/19/2013  . Hyponatremia 03/19/2013  . Anticoagulated 03/19/2013  . Acute blood loss anemia 03/16/2013  . Intertrochanteric fracture of right hip 03/14/2013    PT - End of Session Activity Tolerance: Patient tolerated treatment well General Behavior During Therapy: [facility] for tasks assessed/performed   Lurena Nida, PTA/CLT 05/09/2013, 12:14 PM

## 2013-05-13 ENCOUNTER — Ambulatory Visit (HOSPITAL_COMMUNITY)
Admission: RE | Admit: 2013-05-13 | Discharge: 2013-05-13 | Disposition: A | Discharge status: Home / Self Care | Payer: Medicare Other | Source: Ambulatory Visit | Attending: Internal Medicine | Admitting: Internal Medicine

## 2013-05-13 DIAGNOSIS — R262 Difficulty in walking, not elsewhere classified: Secondary | ICD-10-CM

## 2013-05-13 DIAGNOSIS — M25551 Pain in right hip: Secondary | ICD-10-CM

## 2013-05-13 NOTE — Progress Notes (Signed)
Physical Therapy Treatment Patient Details  Name: Ricardo Garrett MRN: [id number] Date of Birth: 05/03/1946  Today's Date: 05/13/2013 Time: 4132-4401 PT Time Calculation (min): 59 min Charges: TE: 1018-1100 Estim/Ice: 1100-1117 Visit#: 5 of 8  Re-eval: 05/29/13 Assessment Diagnosis: rt hip fracture  Surgical Date: 03/15/13 Next MD Visit: Dr. Hilda Lias 05/17/13  Authorization: BCBS Medicare  Authorization Time Period:    Authorization Visit#: 5 of 8   Subjective: Symptoms/Limitations Symptoms: Pt reports he is sore from this weekend when he was doing some work outdoors.  Sitll remain concerned about the pain to his groin.  Pain Assessment Currently in Pain?: Yes Pain Score: 5  Pain Location: Hip Pain Orientation: Right  Precautions/Restrictions  Precautions Precautions: Fall Restrictions Weight Bearing Restrictions: Yes RLE Weight Bearing: Partial weight bearing RLE Partial Weight Bearing Percentage or Pounds: 50%  Exercise/Treatments Aerobic Stationary Bike: NuStep 10' [place] Level 3, reistance level 4 SPM avg: 90  Machines for Strengthening Cybex Knee Extension: 3.5 PL 2x10 BLE Cybex Knee Flexion: 5 PL BLE 2x10 Supine Short Arc Quad Sets: 20 reps Short Arc Quad Sets Limitations: 9# 2-3 asec hold Bridges: 20 reps;Limitations Bridges Limitations: with hip adduction Straight Leg Raises: Right;10 reps (increased pain) Other Supine Knee Exercises: isometric hip flexion: 10x10 sec holds, isometric hip adduction 10x10 sec holds  Modalities Modalities: Electrical Stimulation;Cryotherapy Cryotherapy Number Minutes Cryotherapy: 15 Minutes Cryotherapy Location: Hip Type of Cryotherapy: Ice pack Pharmacologist Location: anterior/medial hip Statistician Action: Lexicographer Parameters: 15 minutes 11 volts Electrical Stimulation Goals: Pain  Physical Therapy Assessment and Plan PT Assessment and Plan Clinical  Impression Statement: Pt continues to be limited by pain to Rt groin with SLR. Encouraged to continue with iso hip flexion to decrease pain and improve strength.  added estim with ice to groing at end of session to decrease pain to 1/10.  PT Plan: Re-eval for MD apt/     Goals Home Exercise Program PT Goal: Perform Home Exercise Program - Progress: Met PT Short Term Goals Time to Complete Short Term Goals: 2 weeks PT Short Term Goal 1: Pt will improve LE strength by 1 muscle grade to prepare for full weight bearing with walking.  PT Short Term Goal 1 - Progress: Progressing toward goal PT Short Term Goal 2: Pt will ambualte with approprriate gait mechanics with 50% weight bearing to decrease risk of secondary injury.  PT Short Term Goal 2 - Progress: Met PT Long Term Goals Time to Complete Long Term Goals:  (6 weeks) PT Long Term Goal 1: Pt will improve his BLE to [facility] in order to go from half kneel to stand with UE assistance to return to work activities.  PT Long Term Goal 1 - Progress: Progressing toward goal PT Long Term Goal 2: Pt will improve his activity tolerance and ambulate with LRAD x15 minutes in indoor and outdoor surfaces.  PT Long Term Goal 2 - Progress: Progressing toward goal Long Term Goal 3: Pt will improve his LEFS to 40/80 for improved QOL.  Long Term Goal 3 Progress: Progressing toward goal  Problem List Patient Active Problem List   Diagnosis Date Noted  . Hip pain 04/29/2013  . Difficulty in walking(719.7) 04/29/2013  . Dermatitis 03/26/2013  . Other and unspecified hyperlipidemia 03/19/2013  . Unspecified constipation 03/19/2013  . Arrhythmia 03/19/2013  . Hyponatremia 03/19/2013  . Anticoagulated 03/19/2013  . Acute blood loss anemia 03/16/2013  . Intertrochanteric fracture of right hip 03/14/2013    PT -  End of Session Activity Tolerance: Patient tolerated treatment well General Behavior During Therapy: [facility] for tasks assessed/performed  GP    [name], MPT, ATC 05/13/2013, 11:39 AM

## 2013-05-16 ENCOUNTER — Ambulatory Visit (HOSPITAL_COMMUNITY): Payer: Medicare Other | Admitting: Physical Therapy

## 2013-05-17 ENCOUNTER — Ambulatory Visit (HOSPITAL_COMMUNITY)
Admission: RE | Admit: 2013-05-17 | Discharge: 2013-05-17 | Disposition: A | Discharge status: Home / Self Care | Payer: Medicare Other | Source: Ambulatory Visit | Attending: Internal Medicine | Admitting: Internal Medicine

## 2013-05-17 DIAGNOSIS — M25551 Pain in right hip: Secondary | ICD-10-CM

## 2013-05-17 DIAGNOSIS — R262 Difficulty in walking, not elsewhere classified: Secondary | ICD-10-CM

## 2013-05-17 NOTE — Evaluation (Signed)
Physical Therapy Re-Evaluation/Treatment  Patient Details  Name: Ricardo Garrett MRN: [id number] Date of Birth: [birth year]  Today's Date: 05/17/2013 Time: 1191-4782 PT Time Calculation (min): 45 min Charges: 1 MMT Self Care: 850-910 TE: 910-930             Visit#: 6 of 8  Re-eval: 05/29/13 Assessment Diagnosis: rt hip fracture  Surgical Date: 03/15/13 Next MD Visit: Dr. Hilda Lias 05/17/13  Authorization: BCBS Medicare    Authorization Time Period:    Authorization Visit#: 6 of 8   Subjective Symptoms/Limitations Symptoms: Pt states that he is a little worried to lose his walker because he has been walking with it for so long.  Continues to have pain in his inner thigh Pain Assessment Currently in Pain?: Yes Pain Score: 4  Pain Location: Hip Pain Orientation: Right  Precautions/Restrictions  Precautions Precautions: Fall Restrictions Weight Bearing Restrictions: Yes RLE Weight Bearing: Partial weight bearing RLE Partial Weight Bearing Percentage or Pounds: 50%  Sensation/Coordination/Flexibility/Functional Tests Functional Tests Functional Tests: Lower Extremity Functional Scale (LEFS) 22/80(was 29/80)  Assessment RLE Strength Right Hip Flexion: 3+/5 (was 2+/5) Right Hip Extension: 4/5 (was 3/5) Right Hip ABduction: 4/5 (was 2+/5) Right Hip ADduction: 4/5 (was 3+/5) Right Knee Flexion: 5/5 (was 3/5) Right Knee Extension: 5/5 (was 4/5)  Mobility/Balance  Ambulation/Gait Ambulation/Gait: Yes Assistive device: Rolling walker Gait Pattern: Decreased hip/knee flexion - right;Antalgic;Decreased stance time - right   Exercise/Treatments Stretches   Aerobic Stationary Bike: NuStep 10' [place] Level 3, reistance level 4 w/o UE A SPM avg: 120 Supine Short Arc Quad Sets: Limitations Short Arc Quad Sets Limitations: 35 reps 3# Straight Leg Raises: Right;20 reps     Physical Therapy Assessment and Plan PT Assessment and Plan Clinical Impression Statement: Mr.  Garrett has attended 6 OP PT visits s/p Rt hip ORIF over the past 3 weeks with the following findings: most restriction is on 50% weightbearing and has still been able to demonstrate improved open chain strength, has increased pain with pain in hip adductors with fascial restrictions, has great fear of losing the walker secondary to balance concerns, has been able to attend some jobs and reports difficulty with going up steps due to the walker.  Will continue to benefit x3 weeks to address gait impairments and progress towards independent gait to decrease risk of falls and improve proprioceptive awareness.  Pt will benefit from skilled therapeutic intervention in order to improve on the following deficits: Decreased strength;Impaired perceived functional ability;Decreased activity tolerance;Difficulty walking;Increased fascial restricitons Rehab Potential: Good PT Frequency: Min 2X/week PT Duration:  (3 weeks) PT Treatment/Interventions: DME instruction;Stair training;Gait training;Functional mobility training;Therapeutic activities;Therapeutic exercise;Balance training;Patient/family education;Manual techniques;Modalities PT Plan: f/u with MD apt and new weightbearing restrictions    Goals Home Exercise Program PT Goal: Perform Home Exercise Program - Progress: Met PT Short Term Goals Time to Complete Short Term Goals: 2 weeks PT Short Term Goal 1: Pt will improve LE strength by 1 muscle grade to prepare for full weight bearing with walking.  PT Short Term Goal 1 - Progress: Met PT Short Term Goal 2: Pt will ambualte with approprriate gait mechanics with 50% weight bearing to decrease risk of secondary injury.  PT Short Term Goal 2 - Progress: Met PT Long Term Goals Time to Complete Long Term Goals:  (6 weeks) PT Long Term Goal 1: Pt will improve his BLE to [facility] in order to go from half kneel to stand with UE assistance to return to work activities.  PT Long Term Goal  1 - Progress: Progressing  toward goal PT Long Term Goal 2: Pt will improve his activity tolerance and ambulate with LRAD x15 minutes in indoor and outdoor surfaces.  PT Long Term Goal 2 - Progress: Progressing toward goal Long Term Goal 3: Pt will improve his LEFS to 40/80 for improved QOL.  Long Term Goal 3 Progress: Progressing toward goal  Problem List Patient Active Problem List   Diagnosis Date Noted  . Hip pain 04/29/2013  . Difficulty in walking(719.7) 04/29/2013  . Dermatitis 03/26/2013  . Other and unspecified hyperlipidemia 03/19/2013  . Unspecified constipation 03/19/2013  . Arrhythmia 03/19/2013  . Hyponatremia 03/19/2013  . Anticoagulated 03/19/2013  . Acute blood loss anemia 03/16/2013  . Intertrochanteric fracture of right hip 03/14/2013    PT - End of Session Activity Tolerance: Patient tolerated treatment well General Behavior During Therapy: [facility] for tasks assessed/performed PT Plan of Care Consulted and Agree with Plan of Care: Patient  GP    Lynette Noah, MPT, ATC 05/17/2013, 9:30 AM  Physician Documentation Your signature is required to indicate approval of the treatment plan as stated above.  Please sign and either send electronically or make a copy of this report for your files and return this physician signed original.   Please mark one 1.__approve of plan  2. ___approve of plan with the following conditions.   ______________________________                                                          _____________________ Physician Signature                                                                                                             Date

## 2013-05-21 ENCOUNTER — Ambulatory Visit (HOSPITAL_COMMUNITY)
Admission: RE | Admit: 2013-05-21 | Discharge: 2013-05-21 | Disposition: A | Discharge status: Home / Self Care | Payer: Medicare Other | Source: Ambulatory Visit | Attending: Internal Medicine | Admitting: Internal Medicine

## 2013-05-21 DIAGNOSIS — M25551 Pain in right hip: Secondary | ICD-10-CM

## 2013-05-21 DIAGNOSIS — R262 Difficulty in walking, not elsewhere classified: Secondary | ICD-10-CM

## 2013-05-21 NOTE — Progress Notes (Signed)
Physical Therapy Treatment Patient Details  Name: Ricardo Garrett MRN: [id number] Date of Birth: June 25, 1946  Today's Date: 05/21/2013 Time: 0924-1002 PT Time Calculation (min): 38 min Charges:  TE: [phone] Gait: [phone] Visit#: 7 of 8  Re-eval: 05/29/13    Authorization: BCBS Medicare  Authorization Time Period:    Authorization Visit#: 7 of 8   Subjective: Symptoms/Limitations Symptoms: Pt comes in today with one SPC and restrictions have been lifted.  He reports he has taken 2 pain pills this morning, but was walking well yesterday with this cane.  He reports he has not had to take pain pills for a few days.  Pain Assessment Currently in Pain?: Yes Pain Score: 2   Precautions/Restrictions     Exercise/Treatments Mobility/Balance        Stretches   Aerobic Stationary Bike: NuStep 10' [place] Level 3, reistance level 4 w/o UE A SPM avg: 90  Machines for Strengthening Cybex Knee Extension: 3.5 PL 20 reps BLE Cybex Knee Flexion: 5 PL BLE 2x10 Plyometrics   Standing Functional Squat: 10 reps Gait Training: with SPC and independently to improve knee flexion and gait mechanics Seated   Supine   Sidelying   Prone      Balance Exercises Standing Tandem Gait: Forward;2 reps;Limitations Tandem Gait Limitations: min A Sidestepping: 2 reps;Limitations Sidestepping Limitations: min A Heel Raises: Both;10 reps Toe Raise: Both;10 reps  Yoga Poses    Seated    Supine       Physical Therapy Assessment and Plan PT Assessment and Plan Clinical Impression Statement: Added standing activiites to improve closed chain strength. Gait training at end of session to improve speed and mechanics.  Pt requires min cueing at end of session for proper mechanics.  Pt has improved mechanics with increasing speed.  Rehab Potential: Good PT Frequency: Min 2X/week PT Duration:  (3 weeks) PT Treatment/Interventions: DME instruction;Stair training;Gait training;Functional mobility  training;Therapeutic activities;Therapeutic exercise;Balance training;Patient/family education;Manual techniques;Modalities PT Plan: Re-eval. Continue to progress LE strength     Goals    Problem List Patient Active Problem List   Diagnosis Date Noted  . Hip pain 04/29/2013  . Difficulty in walking(719.7) 04/29/2013  . Dermatitis 03/26/2013  . Other and unspecified hyperlipidemia 03/19/2013  . Unspecified constipation 03/19/2013  . Arrhythmia 03/19/2013  . Hyponatremia 03/19/2013  . Anticoagulated 03/19/2013  . Acute blood loss anemia 03/16/2013  . Intertrochanteric fracture of right hip 03/14/2013    PT - End of Session Activity Tolerance: Patient tolerated treatment well General Behavior During Therapy: [facility] for tasks assessed/performed PT Plan of Care Consulted and Agree with Plan of Care: Patient  GP    Izabela Ow, MPT, ATC 05/21/2013, 10:08 AM

## 2013-05-24 ENCOUNTER — Ambulatory Visit (HOSPITAL_COMMUNITY)
Admission: RE | Admit: 2013-05-24 | Discharge: 2013-05-24 | Disposition: A | Discharge status: Home / Self Care | Payer: Medicare Other | Source: Ambulatory Visit | Attending: Internal Medicine | Admitting: Internal Medicine

## 2013-05-24 DIAGNOSIS — M25551 Pain in right hip: Secondary | ICD-10-CM

## 2013-05-24 DIAGNOSIS — R262 Difficulty in walking, not elsewhere classified: Secondary | ICD-10-CM

## 2013-05-24 NOTE — Progress Notes (Signed)
Physical Therapy Treatment Patient Details  Name: Ricardo Garrett MRN: [id number] Date of Birth: 07-12-1946  Today's Date: 05/24/2013 Time: [phone] PT Time Calculation (min): 41 min Charges:  TE: [phone] Visit#: 8 of 16  Re-eval: 06/23/13    Authorization: BCBS Medicare  Authorization Time Period:    Authorization Visit#: 8 of 10   Subjective: Symptoms/Limitations Symptoms: Pt reports that he may have overdid it yesterday working up at a job site.  He did not life much, but did a lot of walking.  Pain Assessment Currently in Pain?: Yes Pain Score: 3  Pain Location: Hip  Precautions/Restrictions     Exercise/Treatments Aerobic Stationary Bike: NuStep 10' [place] Level 3, reistance level 4 w/o UE A SPM avg: 124  Machines for Strengthening Cybex Knee Extension: RLE only 1 PL 2x10 Cybex Knee Flexion: RLE only 3 PL 2x10  Standing Heel Raises: 20 reps;Limitations Heel Raises Limitations: Toe raise 20 reps w/o HHA Knee Flexion: 20 reps;Limitations;Right Knee Flexion Limitations: 2# Lateral Step Up: Right;10 reps;Hand Hold: 1;Step Height: 4" Forward Step Up: Right;10 reps;Hand Hold: 0;Step Height: 4" Step Down: Right;10 reps;Hand Hold: 1;Step Height: 2" Functional Squat: 20 reps Rocker Board: 2 minutes Other Standing Knee Exercises: Heel walking 2 RT  Physical Therapy Assessment and Plan PT Assessment and Plan Clinical Impression Statement: Added standing and strengtheing activities to improve RLE only. Pt has significant fatigue at end of session. Continues to improve his overall strength.  Added heel walking to improve gait mechanics.  Teach back for proper gait.  Rehab Potential: Good PT Frequency: Min 2X/week PT Duration:  (3 weeks) PT Treatment/Interventions: DME instruction;Stair training;Gait training;Functional mobility training;Therapeutic activities;Therapeutic exercise;Balance training;Patient/family education;Manual techniques;Modalities PT Plan: Re-eval.  Continue to progress LE strength     Goals    Problem List Patient Active Problem List   Diagnosis Date Noted  . Hip pain 04/29/2013  . Difficulty in walking(719.7) 04/29/2013  . Dermatitis 03/26/2013  . Other and unspecified hyperlipidemia 03/19/2013  . Unspecified constipation 03/19/2013  . Arrhythmia 03/19/2013  . Hyponatremia 03/19/2013  . Anticoagulated 03/19/2013  . Acute blood loss anemia 03/16/2013  . Intertrochanteric fracture of right hip 03/14/2013    PT - End of Session Activity Tolerance: Patient tolerated treatment well General Behavior During Therapy: [facility] for tasks assessed/performed PT Plan of Care PT Patient Instructions: teach back for gait and HEP.  Consulted and Agree with Plan of Care: Patient  GP    Ronda Kazmi, MPT, ATC 05/24/2013, 10:18 AM

## 2013-05-27 ENCOUNTER — Ambulatory Visit (HOSPITAL_COMMUNITY): Payer: Self-pay | Admitting: Physical Therapy

## 2013-05-28 ENCOUNTER — Ambulatory Visit (HOSPITAL_COMMUNITY): Payer: Medicare Other

## 2013-05-29 ENCOUNTER — Ambulatory Visit (HOSPITAL_COMMUNITY)
Admission: RE | Admit: 2013-05-29 | Discharge: 2013-05-29 | Disposition: A | Discharge status: Home / Self Care | Payer: Medicare Other | Source: Ambulatory Visit | Attending: Internal Medicine | Admitting: Internal Medicine

## 2013-05-29 DIAGNOSIS — R262 Difficulty in walking, not elsewhere classified: Secondary | ICD-10-CM

## 2013-05-29 NOTE — Progress Notes (Signed)
Physical Therapy Treatment Patient Details  Name: Ricardo Garrett MRN: [id number] Date of Birth: 1945-12-22  Today's Date: 05/29/2013 Time: 4098-1191 PT Time Calculation (min): 50 min Charge: Therex [id number], Manual [id number], Gait [id number]  Visit#: 9 of 16  Re-eval: 06/23/13 Assessment Diagnosis: rt hip fracture  Surgical Date: 03/15/13 Next MD Visit: Dr. Hilda Lias  Prior Therapy: SN[facility]  Authorization: BCBS Medicare  Authorization Time Period:    Authorization Visit#: 9 of 10   Subjective: Symptoms/Limitations Symptoms: Pt stated he feels like he is doing too much, current pain scale 5/10 Rt hip.   Pain Assessment Currently in Pain?: Yes Pain Score: 5  Pain Location: Hip Pain Orientation: Right  Precautions/Restrictions  Precautions Precautions: Fall  Exercise/Treatments Aerobic Stationary Bike: NuStep 10' [place] Level 3, reistance level 4 w/o UE A SPM avg: 118 Tread Mill: Gait training x 8' @.35-->.40 LL 87 Standing Gait Training: with SPC and independently to improve heel strike and knee flexion and gait mechanics   Manual Therapy Manual Therapy: Myofascial release Myofascial Release: Lt Sidelying, Rt hip abductor and scar tissue   Physical Therapy Assessment and Plan PT Assessment and Plan Clinical Impression Statement: Manual techniques complete to reduce fascial restrictions for improved mobility and pain relief.  Pt explained importance of proper gait mechanics and began gait trainer on treadmill to improve mechanics with multimodal cueing.  Pt stated pain reduced at end of session.   PT Plan: Continue with current POC for LE strengthening, manual techniques, and improve gait mechanics.    Goals    Problem List Patient Active Problem List   Diagnosis Date Noted  . Hip pain 04/29/2013  . Difficulty in walking(719.7) 04/29/2013  . Dermatitis 03/26/2013  . Other and unspecified hyperlipidemia 03/19/2013  . Unspecified constipation 03/19/2013  . Arrhythmia  03/19/2013  . Hyponatremia 03/19/2013  . Anticoagulated 03/19/2013  . Acute blood loss anemia 03/16/2013  . Intertrochanteric fracture of right hip 03/14/2013    PT - End of Session Activity Tolerance: Patient tolerated treatment well General Behavior During Therapy: [facility] for tasks assessed/performed  GP    Juel Burrow 05/29/2013, 10:57 AM

## 2013-05-30 ENCOUNTER — Ambulatory Visit (HOSPITAL_COMMUNITY)
Admission: RE | Admit: 2013-05-30 | Discharge: 2013-05-30 | Disposition: A | Discharge status: Home / Self Care | Payer: Medicare Other | Source: Ambulatory Visit | Attending: Internal Medicine | Admitting: Internal Medicine

## 2013-05-30 NOTE — Progress Notes (Signed)
Physical Therapy Treatment Patient Details  Name: Ricardo Garrett MRN: [id number] Date of Birth: Sep 09, 1946  Today's Date: 05/30/2013 Time: 0935-1020 PT Time Calculation (min): 45 min  Visit#: 10 of 16  Re-eval: 06/23/13 Charges:  therex [phone]  (27'), manual 1004-1016 (12') Authorization: BCBS Medicare  Authorization Visit#: 10 of 20   Subjective: Pt states he is up on his feet most of the day.  States he is still not where he wants to be and is unable to complete a lot of his previous activities/tasks as he was before.  States his hip gets really sore by the end of the day.   Exercise/Treatments Aerobic Stationary Bike: NuStep 10' [place] Level 3, reistance level 4 w/o UE A SPM avg: 118 Standing Stairs: reciprocally 1 HR in dept 2RT Other Standing Knee Exercises: retro, tandem and side stepping 1RT each without AD   Manual Therapy Manual Therapy: Myofascial release Myofascial Release: Lt sidelying over Rt hip scar into abductor muscle  Physical Therapy Assessment and Plan PT Assessment and Plan Clinical Impression Statement: Progressed balance activities without LOB and able to complete stairs in gym reciprocally using 1 HR.  Encouraged completeing prone hip extension and hamstring curls at home as these appear to be the weakest muscles.  Adhesions decreased in Rt hip following manual techniques. PT Plan: Continue with POC utilizing manual to decrease adhesions and pain.  Progress to stairwell in hospital to work on reciprocal stairs.  Add SLS and SLS wtih vectors and give pt HEP with prone hip extension and hamsting curls next visit.     PT - End of Session Activity Tolerance: Patient tolerated treatment well General Behavior During Therapy: [facility] for tasks assessed/performed  GP Functional Limitation: Mobility: Walking and moving around Mobility: Walking and Moving Around Current Status ([id number]): At least 20 percent but less than 40 percent impaired, limited or  restricted Mobility: Walking and Moving Around Goal Status [phone]): At least 1 percent but less than 20 percent impaired, limited or restricted  Lurena Nida, PTA/CLT 05/30/2013, 10:50 AM

## 2013-06-04 ENCOUNTER — Ambulatory Visit (HOSPITAL_COMMUNITY)
Admission: RE | Admit: 2013-06-04 | Discharge: 2013-06-04 | Disposition: A | Discharge status: Home / Self Care | Payer: Medicare Other | Source: Ambulatory Visit | Attending: Internal Medicine | Admitting: Internal Medicine

## 2013-06-04 DIAGNOSIS — IMO0001 Reserved for inherently not codable concepts without codable children: Secondary | ICD-10-CM | POA: Insufficient documentation

## 2013-06-04 DIAGNOSIS — M25559 Pain in unspecified hip: Secondary | ICD-10-CM | POA: Insufficient documentation

## 2013-06-04 DIAGNOSIS — M25551 Pain in right hip: Secondary | ICD-10-CM

## 2013-06-04 DIAGNOSIS — R262 Difficulty in walking, not elsewhere classified: Secondary | ICD-10-CM

## 2013-06-04 NOTE — Progress Notes (Signed)
Physical Therapy Treatment Patient Details  Name: Ricardo Garrett MRN: [id number] Date of Birth: Feb 10, 1946  Today's Date: 06/04/2013 Time: 0932-1016 PT Time Calculation (min): 44 min Charges TE: [phone] NMR: 1002-1016 Visit#: 11 of 16  Re-eval: 06/23/13    Authorization: BCBS Medicare  Authorization Time Period:    Authorization Visit#: 11 of 20   Subjective: Symptoms/Limitations Symptoms: Pt reprts he has slowed down a lot this weekend and is still having pain.  Feels he is still very weak. Had to take two pain pills before treatment today. Reports he does not drink a lot of water and did drink 6 beers last night.  Pain Assessment Currently in Pain?: Yes Pain Score: 6  Pain Location: Hip Pain Orientation: Right Pain Type: Acute pain;Surgical pain  Precautions/Restrictions     Exercise/Treatments Aerobic Stationary Bike: NuStep 10' [place] Level 3, reistance level 5 w/o UE A SPM avg: 123 Machines for Strengthening Cybex Knee Extension: RLE only 1 PL 2x10 Cybex Knee Flexion: RLE only 3 PL 2x10  Standing Lateral Step Up: Right;20 reps;Hand Hold: 0;Step Height: 2" Forward Step Up: Right;Hand Hold: 0;20 reps;Step Height: 2" Step Down: Right;Step Height: 2";20 reps;Hand Hold: 0 Other Standing Knee Exercises: side stepping with yellow tubing: 7 reps each: 3 steps, 2 steps, 1 step; Retro gait 2 RT, heel walin 2 RT Other Standing Knee Exercises: vector stance 5x5 sec holds    Physical Therapy Assessment and Plan PT Assessment and Plan Clinical Impression Statement: continued to advance strengthening and balance activities today. continues to requie mod cueing for proper gait technqiues. updated HEP with standing activities (pt was given hip extension and prone HS curl on inital evaluation).     Goals    Problem List Patient Active Problem List   Diagnosis Date Noted  . Hip pain 04/29/2013  . Difficulty in walking(719.7) 04/29/2013  . Dermatitis 03/26/2013  . Other and  unspecified hyperlipidemia 03/19/2013  . Unspecified constipation 03/19/2013  . Arrhythmia 03/19/2013  . Hyponatremia 03/19/2013  . Anticoagulated 03/19/2013  . Acute blood loss anemia 03/16/2013  . Intertrochanteric fracture of right hip 03/14/2013    PT - End of Session Activity Tolerance: Patient tolerated treatment well General Behavior During Therapy: [facility] for tasks assessed/performed PT Plan of Care PT Home Exercise Plan: updated with standing PT Patient Instructions: teach back for gait, encouraged increasing water intake and decreasing alcohol use to decrease pain Consulted and Agree with Plan of Care: Patient;Family member/caregiver Family Member Consulted: wife  GP    Mechel Schutter, MPT, ATC 06/04/2013, 10:37 AM

## 2013-06-07 ENCOUNTER — Ambulatory Visit (HOSPITAL_COMMUNITY)
Admission: RE | Admit: 2013-06-07 | Discharge: 2013-06-07 | Disposition: A | Discharge status: Home / Self Care | Payer: Medicare Other | Source: Ambulatory Visit

## 2013-06-07 DIAGNOSIS — R262 Difficulty in walking, not elsewhere classified: Secondary | ICD-10-CM

## 2013-06-07 NOTE — Progress Notes (Addendum)
Physical Therapy Treatment Patient Details  Name: Ricardo Garrett MRN: [id number] Date of Birth: 01-04-1946  Today's Date: 06/07/2013 Time: 0935-1025 PT Time Calculation (min): 50 min Charge: TE [id number]  Visit#: 12 of 16  Re-eval: 06/23/13    Authorization: BCBS Medicare  Authorization Time Period:    Authorization Visit#: 12 of 20   Subjective: Symptoms/Limitations Symptoms: Pt reports Rt hip pain continues, had to take two pain pills before therapy today.   How long can you stand comfortably?: unlimited amount of time. How long can you walk comfortably?: 5-10 minutes Pain Assessment Currently in Pain?: Yes Pain Score: 4  Pain Location: Hip Pain Orientation: Right  Precautions/Restrictions  Precautions Precautions: Fall  Exercise/Treatments     06/07/13 0900  Knee/Hip Exercises: Aerobic  Stationary Bike NuStep 10' [place] Level 3, reistance level 5 w/o UE A SPM avg: 123  Knee/Hip Exercises: Machines for Strengthening  Cybex Knee Extension RLE only 1 PL 2x10  Cybex Knee Flexion RLE only 4 PL 2x10   Knee/Hip Exercises: Standing  Lateral Step Up Right;20 reps;Hand Hold: 0;Step Height: 2"  Forward Step Up Right;Hand Hold: 0;20 reps;Step Height: 2"  Step Down Right;Step Height: 2";20 reps;Hand Hold: 0  Functional Squat 5 reps;Limitations  Functional Squat Limitations proper lifting 5x  Other Standing Knee Exercises side stepping with yellow tband     Physical Therapy Assessment and Plan PT Assessment and Plan Clinical Impression Statement: Session focus on importance of completeing HEP for strengthening.  Pt continues to require cueing for proper gait mechanics with SPC. PT Plan: Re-eval next session prior MD apt.  Continue wtih strengthening, balance, gait and manual techniques to reduce adhesions and assist with pain.      Goals PT Short Term Goals PT Short Term Goal 1 - Progress: Met PT Short Term Goal 2 - Progress: Met  Problem List Patient Active Problem  List   Diagnosis Date Noted  . Hip pain 04/29/2013  . Difficulty in walking(719.7) 04/29/2013  . Dermatitis 03/26/2013  . Other and unspecified hyperlipidemia 03/19/2013  . Unspecified constipation 03/19/2013  . Arrhythmia 03/19/2013  . Hyponatremia 03/19/2013  . Anticoagulated 03/19/2013  . Acute blood loss anemia 03/16/2013  . Intertrochanteric fracture of right hip 03/14/2013    PT - End of Session Activity Tolerance: Patient tolerated treatment well General Behavior During Therapy: [facility] for tasks assessed/performed  GP    Juel Burrow 06/07/2013, 1:12 PM

## 2013-06-13 ENCOUNTER — Ambulatory Visit (HOSPITAL_COMMUNITY)
Admission: RE | Admit: 2013-06-13 | Discharge: 2013-06-13 | Disposition: A | Discharge status: Home / Self Care | Payer: Medicare Other | Source: Ambulatory Visit | Attending: Internal Medicine | Admitting: Internal Medicine

## 2013-06-13 DIAGNOSIS — M25551 Pain in right hip: Secondary | ICD-10-CM

## 2013-06-13 DIAGNOSIS — R262 Difficulty in walking, not elsewhere classified: Secondary | ICD-10-CM

## 2013-06-13 NOTE — Evaluation (Addendum)
Physical Therapy Re-evaluation / Discharge  Patient Details  Name: Ricardo Garrett MRN: [id number] Date of Birth: 27-Jul-1946  Today's Date: 06/13/2013 Time: 1300-1400 PT Time Calculation (min): 60 min              Visit#: 13 of 16  Re-eval: 06/23/13 Diagnosis: RT hip fracture  Surgical Date: 03/15/13 Next MD Visit: Dr. Hilda Lias 06/14/2013 Authorization: BCBS Medicare    Authorization Visit#: 13 of 20  Charges:  therex 1300-1310 (10'), MMT X 1 1312-1325 (12'), PPT 1328-1338 (10'), self care 1340-1400 (20')  Subjective Symptoms/Limitations Symptoms: Pt is back at work, however states his pain continues to limit his abilities.  pt reports the pain in his Rt hip is the most frustrating for him. Pain Assessment Currently in Pain?: Yes Pain Score: 4  Pain Location: Hip Pain Orientation: Right    Objective: Functional Tests Functional Tests: Lower Extremity Functional Scale (LEFS) 22/80(was 29/80)   RLE Strength Right Hip Flexion: 4/5 (was 3+/5) Right Hip Extension: 5/5 (was 4/5) Right Hip ABduction: 5/5 (was 4/5) Right Hip ADduction: 5/5 (was 4/5) Right Knee Flexion: 5/5 (was 5/5) Right Knee Extension: 5/5 (was 5/5)   Exercise/Treatments Aerobic Stationary Bike: NuStep 10' [place] Level 3, reistance level 5 w/o UE A SPM avg: 123    Physical Therapy Assessment and Plan PT Assessment and Plan Clinical Impression Statement: Pt has met all goals as set at initial evaluation.  Pt tends to "overdo" and push self hard which may be a contributing factor to his increasing pain by the end of the day.  Pt states he is kneeling, squatting, climbing under cars and walking community distances with his SPC.  Educated pt on letting pain guide him to taking rest breaks and pacing himself.  Also educated on modalites to assist with pain control.   PT Plan: May discharge to HEP due to meeting all goals.     Goals Home Exercise Program PT Goal: Perform Home Exercise Program - Progress:  Met PT Short Term Goals Time to Complete Short Term Goals: 2 weeks PT Short Term Goal 1: Pt will improve LE strength by 1 muscle grade to prepare for full weight bearing with walking.  PT Short Term Goal 1 - Progress: Met PT Short Term Goal 2: Pt will ambualte with approprriate gait mechanics with 50% weight bearing to decrease risk of secondary injury.  PT Short Term Goal 2 - Progress: Met PT Long Term Goals Time to Complete Long Term Goals:  (6 weeks) PT Long Term Goal 1: Pt will improve his BLE to [facility] in order to go from half kneel to stand with UE assistance to return to work activities.  PT Long Term Goal 1 - Progress: Met PT Long Term Goal 2: Pt will improve his activity tolerance and ambulate with LRAD x15 minutes in indoor and outdoor surfaces.  PT Long Term Goal 2 - Progress: Met Long Term Goal 3: Pt will improve his LEFS to 40/80 for improved QOL.  Long Term Goal 3 Progress: Met  Problem List Patient Active Problem List   Diagnosis Date Noted  . Hip pain 04/29/2013  . Difficulty in walking(719.7) 04/29/2013  . Dermatitis 03/26/2013  . Other and unspecified hyperlipidemia 03/19/2013  . Unspecified constipation 03/19/2013  . Arrhythmia 03/19/2013  . Hyponatremia 03/19/2013  . Anticoagulated 03/19/2013  . Acute blood loss anemia 03/16/2013  . Intertrochanteric fracture of right hip 03/14/2013    PT - End of Session Activity Tolerance: Patient tolerated treatment well General Behavior During  Therapy: WFL for tasks assessed/performed PT Plan of Care Consulted and Agree with Plan of Care: Patient  GP  Functional Limitation: Mobility: Walking and moving around  Mobility: Walking and Moving Around Current Status (O1308): At least 1 percent but less than 20 percent impaired, limited or restricted  Mobility: Walking and Moving Around Goal Status [phone]): At least 1 percent but less than 20 percent impaired, limited or restricted Discharge:  Walking and Moving Around  discharge [phone]):  At least 1 percent but less than 20 percent impaired, limited or restricted    Lurena Nida, PTA/CLT 06/13/2013, 2:37 PM

## 2013-06-14 NOTE — Evaluation (Signed)
Ricardo Garrett, MPT, ATC 

## 2013-07-03 NOTE — Addendum Note (Signed)
Encounter addended by: Lurena Nida, PTA on: 07/03/2013 11:43 AM<BR>     Documentation filed: Clinical Notes, Flowsheet VN

## 2015-11-19 ENCOUNTER — Other Ambulatory Visit (HOSPITAL_COMMUNITY): Payer: Self-pay | Admitting: Internal Medicine

## 2015-11-19 DIAGNOSIS — R0602 Shortness of breath: Secondary | ICD-10-CM

## 2015-11-23 ENCOUNTER — Other Ambulatory Visit (HOSPITAL_COMMUNITY): Payer: Self-pay | Admitting: Respiratory Therapy

## 2015-11-23 ENCOUNTER — Ambulatory Visit (HOSPITAL_COMMUNITY)
Admission: RE | Admit: 2015-11-23 | Discharge: 2015-11-23 | Disposition: A | Discharge status: Home / Self Care | Payer: Commercial Managed Care - HMO | Source: Ambulatory Visit | Attending: Internal Medicine | Admitting: Internal Medicine

## 2015-11-23 DIAGNOSIS — R918 Other nonspecific abnormal finding of lung field: Secondary | ICD-10-CM | POA: Insufficient documentation

## 2015-11-23 DIAGNOSIS — J9819 Other pulmonary collapse: Secondary | ICD-10-CM | POA: Diagnosis not present

## 2015-11-23 DIAGNOSIS — R0602 Shortness of breath: Secondary | ICD-10-CM | POA: Diagnosis present

## 2015-11-23 DIAGNOSIS — R079 Chest pain, unspecified: Secondary | ICD-10-CM | POA: Insufficient documentation

## 2015-11-23 DIAGNOSIS — J90 Pleural effusion, not elsewhere classified: Secondary | ICD-10-CM | POA: Insufficient documentation

## 2015-11-23 DIAGNOSIS — J439 Emphysema, unspecified: Secondary | ICD-10-CM | POA: Diagnosis not present

## 2015-12-18 ENCOUNTER — Encounter (HOSPITAL_COMMUNITY): Payer: Self-pay

## 2015-12-30 ENCOUNTER — Ambulatory Visit (HOSPITAL_COMMUNITY)
Admission: RE | Admit: 2015-12-30 | Discharge: 2015-12-30 | Disposition: A | Discharge status: Home / Self Care | Payer: Commercial Managed Care - HMO | Source: Ambulatory Visit | Attending: Internal Medicine | Admitting: Internal Medicine

## 2015-12-30 DIAGNOSIS — R0602 Shortness of breath: Secondary | ICD-10-CM | POA: Insufficient documentation

## 2015-12-30 LAB — PULMONARY FUNCTION TEST
DL/VA % PRED: 56 %
DL/VA: 2.53 ml/min/mmHg/L
DLCO UNC % PRED: 31 %
DLCO unc: 9.37 ml/min/mmHg
FEF 25-75 POST: 0.52 L/s
FEF 25-75 Pre: 0.73 L/sec
FEF2575-%CHANGE-POST: -29 %
FEF2575-%PRED-POST: 23 %
FEF2575-%Pred-Pre: 32 %
FEV1-%CHANGE-POST: -6 %
FEV1-%PRED-PRE: 52 %
FEV1-%Pred-Post: 48 %
FEV1-POST: 1.45 L
FEV1-Pre: 1.55 L
FEV1FVC-%CHANGE-POST: 5 %
FEV1FVC-%Pred-Pre: 89 %
FEV6-%Change-Post: -5 %
FEV6-%Pred-Post: 54 %
FEV6-%Pred-Pre: 58 %
FEV6-PRE: 2.22 L
FEV6-Post: 2.09 L
FEV6FVC-%Change-Post: 6 %
FEV6FVC-%PRED-PRE: 99 %
FEV6FVC-%Pred-Post: 105 %
FVC-%CHANGE-POST: -11 %
FVC-%PRED-POST: 51 %
FVC-%Pred-Pre: 58 %
FVC-PRE: 2.38 L
FVC-Post: 2.1 L
POST FEV1/FVC RATIO: 69 %
PRE FEV6/FVC RATIO: 93 %
Post FEV6/FVC ratio: 99 %
Pre FEV1/FVC ratio: 65 %
RV % PRED: 105 %
RV: 2.5 L
TLC % pred: 75 %
TLC: 4.99 L

## 2015-12-30 MED ORDER — ALBUTEROL SULFATE (2.5 MG/3ML) 0.083% IN NEBU
2.5000 mg | INHALATION_SOLUTION | Freq: Once | RESPIRATORY_TRACT | Status: AC
  Administered 2015-12-30: 2.5 mg via RESPIRATORY_TRACT

## 2016-01-08 ENCOUNTER — Other Ambulatory Visit (HOSPITAL_COMMUNITY): Payer: Self-pay | Admitting: Internal Medicine

## 2016-01-08 DIAGNOSIS — H547 Unspecified visual loss: Secondary | ICD-10-CM

## 2016-01-08 DIAGNOSIS — N289 Disorder of kidney and ureter, unspecified: Secondary | ICD-10-CM

## 2016-01-11 ENCOUNTER — Ambulatory Visit (HOSPITAL_COMMUNITY)
Admission: RE | Admit: 2016-01-11 | Discharge: 2016-01-11 | Disposition: A | Discharge status: Home / Self Care | Payer: Commercial Managed Care - HMO | Source: Ambulatory Visit | Attending: Internal Medicine | Admitting: Internal Medicine

## 2016-01-11 ENCOUNTER — Ambulatory Visit (HOSPITAL_COMMUNITY): Payer: Commercial Managed Care - HMO

## 2016-01-11 DIAGNOSIS — I6522 Occlusion and stenosis of left carotid artery: Secondary | ICD-10-CM | POA: Diagnosis not present

## 2016-01-11 DIAGNOSIS — H547 Unspecified visual loss: Secondary | ICD-10-CM | POA: Diagnosis present

## 2016-01-11 DIAGNOSIS — N289 Disorder of kidney and ureter, unspecified: Secondary | ICD-10-CM | POA: Diagnosis present

## 2016-01-11 DIAGNOSIS — J90 Pleural effusion, not elsewhere classified: Secondary | ICD-10-CM | POA: Diagnosis not present

## 2016-01-18 NOTE — Progress Notes (Signed)
Patient ID: Ricardo Garrett, male   DOB: [birth year], 70 y.o.   MRN: [id number]     Cardiology Office Note   Date:  01/20/2016   ID:  Ricardo Garrett, DOB 1946/04/10, MRN [id number]  PCP:  Wende Neighbors, MD  Cardiologist:   [name], MD   No chief complaint on file.     History of Present Illness: Ricardo Garrett is a 70 y.o. male who presents for evaluation of heart failure  Previous smoker Has had LE edema on diuretic.  Notes from Dr hall reviewed.  CRF Cr 1.5  Indicated low albumin  He has been going down hill for a few weeks. Does not see doctors often.  Increasing dyspnea and edema including scrotal swelling.  He quit smoking 25 yrs ago.  Has not had f/u for an  Abnormal CT in January Started on diuretic by Dr Nevada Crane but stopped taking cause it wasn't working . In office today tremulous and in rapid afib rate 136  Denies chest pain syncope TIA Not on blood thinners.    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  Tiny left pleural effusion.  Bilateral small pulmonary nodules, the larger of which is 5 mm  Past Medical History  Diagnosis Date  . High cholesterol   . Broken arm     Past Surgical History  Procedure Laterality Date  . Tonsillectomy    . Skin graft    . Compression hip screw Right 03/15/2013    Procedure: COMPRESSION HIP;  Surgeon: Sanjuana Kava, MD;  Location: [facility];  Service: Orthopedics;  Laterality: Right;     Current Outpatient Prescriptions  Medication Sig Dispense Refill  . acetaminophen (TYLENOL) 325 MG tablet Take 2 tablets (650 mg total) by mouth every 6 (six) hours as needed (mild pain).    Marland Kitchen ALPRAZolam (XANAX) 0.5 MG tablet Take 0.5 mg by mouth 2 (two) times  daily.    Marland Kitchen atorvastatin (LIPITOR) 20 MG tablet Take 20 mg by mouth daily.    Marland Kitchen BREO ELLIPTA 200-25 MCG/INH AEPB     . enoxaparin (LOVENOX) 40 MG/0.4ML injection Inject 0.4 mLs (40 mg total) into the skin daily. Last dose June 17. 0 Syringe   . HYDROcodone-acetaminophen (NORCO/VICODIN) 5-325 MG per tablet Take 1 tablet every 6 hours or take 2 tablets every 6 hours as needed for severe pain 240 tablet 0  . mirtazapine (REMERON) 30 MG tablet     . naproxen (NAPROSYN) 500 MG tablet     . sertraline (ZOLOFT) 50 MG tablet Take 50 mg by mouth daily.    Marland Kitchen SPIRIVA HANDIHALER 18 MCG inhalation capsule     . furosemide (LASIX) 40 MG tablet Take 40 mg by mouth daily. Reported on 01/20/2016     No current facility-administered medications for this visit.    Allergies:   Review of patient's allergies indicates no known allergies.    Social History:  The patient  reports that he has quit smoking. He does not have any smokeless tobacco history on file. He reports that he drinks alcohol. He reports that he uses illicit drugs (Marijuana).   Family History:  The patient's family history is not on file.    ROS:  Please see the history  of present illness.   Otherwise, review of systems are positive for none.   All other systems are reviewed and negative.    PHYSICAL EXAM: VS:  BP 130/82 mmHg  Pulse 150  Ht _0  (1.727 m)  Wt 84.641 kg (186 lb 9.6 oz)  BMI 28.38 kg/m2  SpO2 93% , BMI Body mass index is 28.38 kg/(m^2). Affect appropriate Chronically ill white male  HEENT: poor dentition  Neck supple with no adenopathy JVP normal no bruits no thyromegaly Lungs rhonchi exp  Wheezing ? Right effusion  and good diaphragmatic motion Heart:  S1/S2 no murmur, no rub, gallop or click PMI normal Abdomen: benighn, BS positve, edemetous  no bruit.  No HSM or HJR Distal pulses intact with no bruits Plus 3 bilateral  edema Neuro non-focal Skin warm and dry No muscular weakness     EKG:  02/2013  SR  PAC inferolateral T wave changes  Rapid afib rate 159  Poor R wave progression    Recent Labs: No results found for requested labs within last 365 days.    Lipid Panel No results found for: CHOL, TRIG, HDL, CHOLHDL, VLDL, LDLCALC, LDLDIRECT    Wt Readings from Last 3 Encounters:  01/20/16 84.641 kg (186 lb 9.6 oz)  03/15/13 81.647 kg (180 lb)      Other studies Reviewed: Additional studies/ records that were reviewed today include: Records/ labs Dr Delphina Cahill see HPO  [place], PFTls and ECG Epic  Labs Cr 1.4  Alk P 158  Albumin 2.9 Plus 3 protein in urine Na 138 ESR 7 K 4.3 .    ASSESSMENT AND PLAN:  1.  Afib:  Duration unknown needs to be hospitalized for further w/u and rate control especially given other comorbidities and lung issues.  Start heparin and iv cardizem Will have to see where Cr settles out out before starting NOAC vs coumadin  Echo to assess RV/LV function 2. Pulm:  Abnormal CT in January previous smoker significant dyspnea with ? Anasarca vs pulmonary hypertension Would consult Dr Luan Pulling in hospital CXR 3. Edema:  Iv bid lasix 80 mg  Follow lytes  Will need to r/o abdominal process as well but will know more once echo done as he may just have severe RV/LV failure Exacerbated by rapid afib  With protein in urine r/o nephrotic kidney disease with 24 hr protein    Current medicines are reviewed at length with the patient today.  The patient does not have concerns regarding medicines.  The following changes have been made:  Admit to hospital     Orders Placed This Encounter  Procedures  . EKG 12-Lead     Disposition:  Have discussed with Dr Roderic Palau called bed control for telemetry bed. Direct admit to floor for rate control, diuresis , and anticoagulation with further pulmonary w/u      Signed, [name], MD  01/20/2016 1:31 PM    [facility] [address], [place], [place]  [address] Phone: [phone]; Fax: [phone]

## 2016-01-19 DIAGNOSIS — N183 Chronic kidney disease, stage 3 unspecified: Secondary | ICD-10-CM | POA: Insufficient documentation

## 2016-01-19 DIAGNOSIS — J449 Chronic obstructive pulmonary disease, unspecified: Secondary | ICD-10-CM

## 2016-01-19 DIAGNOSIS — E8809 Other disorders of plasma-protein metabolism, not elsewhere classified: Secondary | ICD-10-CM | POA: Insufficient documentation

## 2016-01-19 DIAGNOSIS — F419 Anxiety disorder, unspecified: Secondary | ICD-10-CM

## 2016-01-19 DIAGNOSIS — R601 Generalized edema: Secondary | ICD-10-CM | POA: Insufficient documentation

## 2016-01-19 HISTORY — DX: Other disorders of plasma-protein metabolism, not elsewhere classified: E88.09

## 2016-01-20 ENCOUNTER — Inpatient Hospital Stay (HOSPITAL_COMMUNITY): Payer: Commercial Managed Care - HMO

## 2016-01-20 ENCOUNTER — Inpatient Hospital Stay (HOSPITAL_COMMUNITY)
Admission: AD | Admit: 2016-01-20 | Discharge: 2016-01-26 | DRG: 292 | Disposition: A | Discharge status: Home Health | Payer: Commercial Managed Care - HMO | Source: Ambulatory Visit | Attending: Internal Medicine | Admitting: Internal Medicine

## 2016-01-20 ENCOUNTER — Encounter (HOSPITAL_COMMUNITY): Payer: Self-pay | Admitting: Internal Medicine

## 2016-01-20 ENCOUNTER — Ambulatory Visit (INDEPENDENT_AMBULATORY_CARE_PROVIDER_SITE_OTHER): Payer: Commercial Managed Care - HMO | Admitting: Cardiovascular Disease

## 2016-01-20 VITALS — BP 130/82 | HR 150 | Ht 68.0 in | Wt 186.6 lb

## 2016-01-20 DIAGNOSIS — N183 Chronic kidney disease, stage 3 unspecified: Secondary | ICD-10-CM | POA: Diagnosis present

## 2016-01-20 DIAGNOSIS — R0602 Shortness of breath: Secondary | ICD-10-CM

## 2016-01-20 DIAGNOSIS — E7800 Pure hypercholesterolemia, unspecified: Secondary | ICD-10-CM | POA: Diagnosis present

## 2016-01-20 DIAGNOSIS — I4891 Unspecified atrial fibrillation: Secondary | ICD-10-CM | POA: Diagnosis present

## 2016-01-20 DIAGNOSIS — H9190 Unspecified hearing loss, unspecified ear: Secondary | ICD-10-CM | POA: Diagnosis present

## 2016-01-20 DIAGNOSIS — J90 Pleural effusion, not elsewhere classified: Secondary | ICD-10-CM | POA: Diagnosis present

## 2016-01-20 DIAGNOSIS — J441 Chronic obstructive pulmonary disease with (acute) exacerbation: Secondary | ICD-10-CM | POA: Diagnosis present

## 2016-01-20 DIAGNOSIS — R000 Tachycardia, unspecified: Secondary | ICD-10-CM

## 2016-01-20 DIAGNOSIS — Z7901 Long term (current) use of anticoagulants: Secondary | ICD-10-CM

## 2016-01-20 DIAGNOSIS — J449 Chronic obstructive pulmonary disease, unspecified: Secondary | ICD-10-CM | POA: Diagnosis present

## 2016-01-20 DIAGNOSIS — R601 Generalized edema: Secondary | ICD-10-CM | POA: Diagnosis present

## 2016-01-20 DIAGNOSIS — Z9119 Patient's noncompliance with other medical treatment and regimen: Secondary | ICD-10-CM | POA: Diagnosis not present

## 2016-01-20 DIAGNOSIS — E785 Hyperlipidemia, unspecified: Secondary | ICD-10-CM | POA: Diagnosis present

## 2016-01-20 DIAGNOSIS — R410 Disorientation, unspecified: Secondary | ICD-10-CM | POA: Diagnosis present

## 2016-01-20 DIAGNOSIS — I5023 Acute on chronic systolic (congestive) heart failure: Secondary | ICD-10-CM | POA: Diagnosis present

## 2016-01-20 DIAGNOSIS — R339 Retention of urine, unspecified: Secondary | ICD-10-CM | POA: Diagnosis present

## 2016-01-20 DIAGNOSIS — J410 Simple chronic bronchitis: Secondary | ICD-10-CM | POA: Diagnosis not present

## 2016-01-20 DIAGNOSIS — Z9889 Other specified postprocedural states: Secondary | ICD-10-CM

## 2016-01-20 DIAGNOSIS — R338 Other retention of urine: Secondary | ICD-10-CM | POA: Diagnosis not present

## 2016-01-20 DIAGNOSIS — T424X5A Adverse effect of benzodiazepines, initial encounter: Secondary | ICD-10-CM | POA: Diagnosis present

## 2016-01-20 DIAGNOSIS — N320 Bladder-neck obstruction: Secondary | ICD-10-CM | POA: Diagnosis present

## 2016-01-20 DIAGNOSIS — I5021 Acute systolic (congestive) heart failure: Secondary | ICD-10-CM | POA: Diagnosis present

## 2016-01-20 DIAGNOSIS — I509 Heart failure, unspecified: Secondary | ICD-10-CM | POA: Diagnosis not present

## 2016-01-20 DIAGNOSIS — E8809 Other disorders of plasma-protein metabolism, not elsewhere classified: Secondary | ICD-10-CM | POA: Diagnosis present

## 2016-01-20 DIAGNOSIS — R358 Other polyuria: Secondary | ICD-10-CM | POA: Diagnosis not present

## 2016-01-20 LAB — CBC
HEMATOCRIT: 46.3 % (ref 39.0–52.0)
HEMOGLOBIN: 15 g/dL (ref 13.0–17.0)
MCH: 29.6 pg (ref 26.0–34.0)
MCHC: 32.4 g/dL (ref 30.0–36.0)
MCV: 91.5 fL (ref 78.0–100.0)
Platelets: 302 10*3/uL (ref 150–400)
RBC: 5.06 MIL/uL (ref 4.22–5.81)
RDW: 15.3 % (ref 11.5–15.5)
WBC: 9.9 10*3/uL (ref 4.0–10.5)

## 2016-01-20 LAB — PROTIME-INR
INR: 1.11 (ref 0.00–1.49)
Prothrombin Time: 14.5 seconds (ref 11.6–15.2)

## 2016-01-20 LAB — TSH: TSH: 3.998 u[IU]/mL (ref 0.350–4.500)

## 2016-01-20 MED ORDER — FLUTICASONE FUROATE-VILANTEROL 200-25 MCG/INH IN AEPB
1.0000 | INHALATION_SPRAY | Freq: Every day | RESPIRATORY_TRACT | Status: DC
  Filled 2016-01-20: qty 28

## 2016-01-20 MED ORDER — ATORVASTATIN CALCIUM 20 MG PO TABS
20.0000 mg | ORAL_TABLET | Freq: Every day | ORAL | Status: DC
  Administered 2016-01-20 – 2016-01-25 (×6): 20 mg via ORAL
  Filled 2016-01-20 (×6): qty 1

## 2016-01-20 MED ORDER — SERTRALINE HCL 50 MG PO TABS
50.0000 mg | ORAL_TABLET | Freq: Every day | ORAL | Status: DC
  Administered 2016-01-21 – 2016-01-26 (×7): 50 mg via ORAL
  Filled 2016-01-20 (×6): qty 1

## 2016-01-20 MED ORDER — LEVALBUTEROL HCL 0.63 MG/3ML IN NEBU
0.6300 mg | INHALATION_SOLUTION | Freq: Four times a day (QID) | RESPIRATORY_TRACT | Status: DC
  Administered 2016-01-20: 0.63 mg via RESPIRATORY_TRACT
  Filled 2016-01-20: qty 3

## 2016-01-20 MED ORDER — FUROSEMIDE 10 MG/ML IJ SOLN
80.0000 mg | Freq: Two times a day (BID) | INTRAMUSCULAR | Status: DC
  Administered 2016-01-20 – 2016-01-25 (×10): 80 mg via INTRAVENOUS
  Filled 2016-01-20 (×10): qty 8

## 2016-01-20 MED ORDER — DILTIAZEM HCL 25 MG/5ML IV SOLN
10.0000 mg | Freq: Once | INTRAVENOUS | Status: AC
  Administered 2016-01-20: 10 mg via INTRAVENOUS
  Filled 2016-01-20: qty 5

## 2016-01-20 MED ORDER — SODIUM CHLORIDE 0.9% FLUSH
3.0000 mL | Freq: Two times a day (BID) | INTRAVENOUS | Status: DC
  Administered 2016-01-21 – 2016-01-26 (×10): 3 mL via INTRAVENOUS

## 2016-01-20 MED ORDER — HEPARIN BOLUS VIA INFUSION
4000.0000 [IU] | Freq: Once | INTRAVENOUS | Status: AC
  Administered 2016-01-20: 4000 [IU] via INTRAVENOUS
  Filled 2016-01-20: qty 4000

## 2016-01-20 MED ORDER — ALPRAZOLAM 0.5 MG PO TABS
0.5000 mg | ORAL_TABLET | Freq: Two times a day (BID) | ORAL | Status: DC
  Administered 2016-01-20 – 2016-01-23 (×5): 0.5 mg via ORAL
  Filled 2016-01-20 (×5): qty 1

## 2016-01-20 MED ORDER — DILTIAZEM HCL 60 MG PO TABS
60.0000 mg | ORAL_TABLET | Freq: Four times a day (QID) | ORAL | Status: DC
  Administered 2016-01-20 – 2016-01-22 (×6): 60 mg via ORAL
  Filled 2016-01-20 (×6): qty 1

## 2016-01-20 MED ORDER — HYDROCODONE-ACETAMINOPHEN 5-325 MG PO TABS
1.0000 | ORAL_TABLET | ORAL | Status: DC | PRN
Start: 2016-01-20 — End: 2016-01-23

## 2016-01-20 MED ORDER — HEPARIN (PORCINE) IN NACL 100-0.45 UNIT/ML-% IJ SOLN
1200.0000 [IU]/h | INTRAMUSCULAR | Status: DC
  Administered 2016-01-20 – 2016-01-22 (×3): 1200 [IU]/h via INTRAVENOUS
  Filled 2016-01-20 (×3): qty 250

## 2016-01-20 MED ORDER — TIOTROPIUM BROMIDE MONOHYDRATE 18 MCG IN CAPS
18.0000 ug | ORAL_CAPSULE | Freq: Every day | RESPIRATORY_TRACT | Status: DC
Start: 2016-01-21 — End: 2016-01-26
  Administered 2016-01-21 – 2016-01-26 (×6): 18 ug via RESPIRATORY_TRACT
  Filled 2016-01-20 (×2): qty 5

## 2016-01-20 MED ORDER — ARFORMOTEROL TARTRATE 15 MCG/2ML IN NEBU
15.0000 ug | INHALATION_SOLUTION | Freq: Two times a day (BID) | RESPIRATORY_TRACT | Status: DC
  Administered 2016-01-20 – 2016-01-26 (×12): 15 ug via RESPIRATORY_TRACT
  Filled 2016-01-20 (×11): qty 2

## 2016-01-20 MED ORDER — BUDESONIDE 0.5 MG/2ML IN SUSP
0.5000 mg | Freq: Two times a day (BID) | RESPIRATORY_TRACT | Status: DC
  Administered 2016-01-20 – 2016-01-26 (×12): 0.5 mg via RESPIRATORY_TRACT
  Filled 2016-01-20 (×12): qty 2

## 2016-01-20 NOTE — Progress Notes (Addendum)
ANTICOAGULATION CONSULT NOTE - Initial Consult  Pharmacy Consult for Heparin Indication: atrial fibrillation  No Known Allergies  Patient Measurements:  Heparin Dosing Weight: 84.6kg  Vital Signs: Temp: 98.1 F (36.7 C) (03/22 1445) Temp Source: Oral (03/22 1445) BP: 137/98 mmHg (03/22 1445) Pulse Rate: 82 (03/22 1445)  Labs: No results for input(s): HGB, HCT, PLT, APTT, LABPROT, INR, HEPARINUNFRC, HEPRLOWMOCWT, CREATININE, CKTOTAL, CKMB, TROPONINI in the last 72 hours.  CrCl cannot be calculated (Patient has no serum creatinine result on file.).   Medical History: Past Medical History  Diagnosis Date  . High cholesterol   . Broken arm    Medications:  See med rec  Assessment: 70 yo male with heart failure, with increasing dyspnea and edema also has afib of which the duration is unknown.  Plan to start heparin and IV cardizem.   Goal of Therapy:  Heparin level 0.3-0.7 units/ml Monitor platelets by anticoagulation protocol: Yes   Plan:  Give 4000 units bolus x 1 Start heparin infusion at 1200 units/hr Check anti-Xa level in 8  hours and daily while on heparin Continue to monitor H&H and platelets  Isac Sarna, BS Vena Austria, BCPS Clinical Pharmacist Pager [phone] 01/20/2016,4:23 PM

## 2016-01-20 NOTE — H&P (Signed)
[facility] History and Physical  Ricardo Garrett [id number] DOB: 01-30-1946    PCP:   Wende Neighbors, MD   Chief Complaint: anasarca.   HPI: Ricardo Garrett is an 70 y.o. male with hx of COPD, pulmonary nodule with atelectasis and consolidation, hx of HLD, severe non compliance, non obstructive carotid stenosis, presented to cardiology's office with anasarca, afib with RVR, and was sent for direct admit.  Dr Areta Haber saw him, and recommended to diurese him, start full anticoagulation along with controlling his rate, and pulmonary consultation requested.   He denied CP, but admitted to SOB and wheezing for at least several months.  He also has proteinuria, along with low albumin.   His EKG showed afib with RVR.  He noted to have swelling of his legs, and though he had given up cigarettes, he has 2nd hand smoke exposure as his wife is still smoking.   Rewiew of Systems:  Constitutional: Negative for malaise, fever and chills. No significant weight loss or weight gain Eyes: Negative for eye pain, redness and discharge, diplopia, visual changes, or flashes of light. ENMT: Negative for ear pain, hoarseness, nasal congestion, sinus pressure and sore throat. No headaches; tinnitus, drooling, or problem swallowing. Cardiovascular: Negative for chest pain,  diaphoresis,  and peripheral edema. ; No orthopnea, PND Respiratory: Negative for cough, hemoptysis, wheezing and stridor. No pleuritic chestpain. Gastrointestinal: Negative for nausea, vomiting, diarrhea, constipation, abdominal pain, melena, blood in stool, hematemesis, jaundice and rectal bleeding.    Genitourinary: Negative for frequency, dysuria, incontinence,flank pain and hematuria; Musculoskeletal: Negative for back pain and neck pain. Negative for swelling and trauma.;  Skin: . Negative for pruritus, rash, abrasions, bruising and skin lesion.; ulcerations Neuro: Negative for headache, lightheadedness and neck stiffness. Negative for  weakness, altered level of consciousness , altered mental status, extremity weakness, burning feet, involuntary movement, seizure and syncope.  Psych: negative for anxiety, depression, insomnia, tearfulness, panic attacks, hallucinations, paranoia, suicidal or homicidal ideation    Past Medical History  Diagnosis Date  . High cholesterol   . Broken arm     Past Surgical History  Procedure Laterality Date  . Tonsillectomy    . Skin graft    . Compression hip screw Right 03/15/2013    Procedure: COMPRESSION HIP;  Surgeon: Sanjuana Kava, MD;  Location: [facility];  Service: Orthopedics;  Laterality: Right;    Medications:  HOME MEDS: Prior to Admission medications   Medication Sig Start Date End Date Taking? Authorizing Provider  acetaminophen (TYLENOL) 325 MG tablet Take 2 tablets (650 mg total) by mouth every 6 (six) hours as needed (mild pain). 03/18/13  Yes Samuella Cota, MD  alfuzosin (UROXATRAL) 10 MG 24 hr tablet Take 10 mg by mouth daily with breakfast.  01/14/16  Yes Historical Provider, MD  ALPRAZolam Duanne Moron) 0.5 MG tablet Take 0.5 mg by mouth 2 (two) times daily.   Yes Historical Provider, MD  atorvastatin (LIPITOR) 20 MG tablet Take 20 mg by mouth daily.   Yes Historical Provider, MD  BREO ELLIPTA 200-25 MCG/INH AEPB Inhale 1 puff into the lungs daily.  12/29/15  Yes Historical Provider, MD  furosemide (LASIX) 40 MG tablet Take 40 mg by mouth daily. Reported on 01/20/2016   Yes Historical Provider, MD  HYDROcodone-acetaminophen (NORCO/VICODIN) 5-325 MG per tablet Take 1 tablet every 6 hours or take 2 tablets every 6 hours as needed for severe pain 03/18/13  Yes Tiffany L Reed, DO  naproxen (NAPROSYN) 500 MG tablet Take 500 mg by  mouth 2 (two) times daily with a meal.  12/05/15  Yes Historical Provider, MD  sertraline (ZOLOFT) 50 MG tablet Take 50 mg by mouth daily.   Yes Historical Provider, MD  SPIRIVA HANDIHALER 18 MCG inhalation capsule  01/16/16  Yes Historical Provider, MD      Allergies:  No Known Allergies  Social History:   reports that he has quit smoking. He does not have any smokeless tobacco history on file. He reports that he drinks alcohol. He reports that he uses illicit drugs (Marijuana).  Family History: History reviewed. No pertinent family history.   Physical Exam: Filed Vitals:   01/20/16 1445  BP: 137/98  Pulse: 82  Temp: 98.1 F (36.7 C)  TempSrc: Oral  Resp: 18  SpO2: 92%   Blood pressure 137/98, pulse 82, temperature 98.1 F (36.7 C), temperature source Oral, resp. rate 18, SpO2 92 %.  GEN:  Pleasant patient lying in the stretcher in no acute distress; cooperative with exam. PSYCH:  alert and oriented x4; does not appear anxious or depressed; affect is appropriate. HEENT: Mucous membranes pink and anicteric; PERRLA; EOM intact; no cervical lymphadenopathy nor thyromegaly or carotid bruit; no JVD; There were no stridor. Neck is very supple. Breasts:: Not examined CHEST WALL: No tenderness CHEST: Normal respiration, bilateral wheezing and rales at both bases. HEART: Regular rate and rhythm.  There are no murmur, rub, or gallops.   BACK: No kyphosis or scoliosis; no CVA tenderness ABDOMEN: soft and non-tender; no masses, no organomegaly, normal abdominal bowel sounds; no pannus; no intertriginous candida. There is no rebound and no distention. Rectal Exam: Not done EXTREMITIES: No bone or joint deformity; age-appropriate arthropathy of the hands and knees; 2+ edema.  no ulcerations.  There is no calf tenderness. Genitalia: not examined PULSES: 2+ and symmetric SKIN: Normal hydration no rash or ulceration CNS: Cranial nerves 2-12 grossly intact no focal lateralizing neurologic deficit.  Speech is fluent; uvula elevated with phonation, facial symmetry and tongue midline. DTR are normal bilaterally, cerebella exam is intact, barbinski is negative and strengths are equaled bilaterally.  No sensory loss.   EKG: Independently  reviewed.   Assessment/Plan Present on Admission:  . COPD (chronic obstructive pulmonary disease) ([place]) . Hypoalbuminemia . CKD (chronic kidney disease) stage 3, GFR 30-59 ml/min . Atrial fibrillation with rapid ventricular response ([place]) . Anasarca  PLAN:  Anasarca:  Will obtain 24 hour urine to quantitate proteinuria, and exclude nephrotic syndrome.  Obtain ECHO, diuresis and follow K.  I/O and daily weights.  Cardiology will follow.  CKD:  Stage III, will continue with avoiding nephrotoxic drug.  D/C NSAIDS.  afib with RVR:  CHADS of 3-4, agreed with IV Heparin, and will use cardiazem for rate control.  Again, obtain ECHO, and check TSH.   COPD/Abnormal CT:  Will consult Dr Luan Pulling.   He has significant smoking hx.  Continue with inhalers.  He doesn't need steroids, but will use Xopenex for his nebs.    Other plans as per orders. Code Status:FULL CODE>    Orvan Falconer, MD. FACP [facility] Pager [phone] 7pm to 7am.  01/20/2016, 4:10 PM

## 2016-01-20 NOTE — Progress Notes (Signed)
Pt. Arrived to unit in stable condition.

## 2016-01-21 ENCOUNTER — Inpatient Hospital Stay (HOSPITAL_COMMUNITY): Payer: Commercial Managed Care - HMO

## 2016-01-21 DIAGNOSIS — J410 Simple chronic bronchitis: Secondary | ICD-10-CM

## 2016-01-21 DIAGNOSIS — I5021 Acute systolic (congestive) heart failure: Secondary | ICD-10-CM | POA: Insufficient documentation

## 2016-01-21 DIAGNOSIS — I509 Heart failure, unspecified: Secondary | ICD-10-CM

## 2016-01-21 DIAGNOSIS — J90 Pleural effusion, not elsewhere classified: Secondary | ICD-10-CM | POA: Insufficient documentation

## 2016-01-21 DIAGNOSIS — I4891 Unspecified atrial fibrillation: Secondary | ICD-10-CM

## 2016-01-21 LAB — CBC
HCT: 43.6 % (ref 39.0–52.0)
HEMOGLOBIN: 13.8 g/dL (ref 13.0–17.0)
MCH: 29.4 pg (ref 26.0–34.0)
MCHC: 31.7 g/dL (ref 30.0–36.0)
MCV: 92.8 fL (ref 78.0–100.0)
Platelets: 260 10*3/uL (ref 150–400)
RBC: 4.7 MIL/uL (ref 4.22–5.81)
RDW: 15.4 % (ref 11.5–15.5)
WBC: 8.4 10*3/uL (ref 4.0–10.5)

## 2016-01-21 LAB — URINALYSIS, ROUTINE W REFLEX MICROSCOPIC
Bilirubin Urine: NEGATIVE
GLUCOSE, UA: NEGATIVE mg/dL
KETONES UR: NEGATIVE mg/dL
LEUKOCYTES UA: NEGATIVE
NITRITE: NEGATIVE
PROTEIN: NEGATIVE mg/dL
Specific Gravity, Urine: 1.02 (ref 1.005–1.030)
pH: 5 (ref 5.0–8.0)

## 2016-01-21 LAB — HEPARIN LEVEL (UNFRACTIONATED)
Heparin Unfractionated: 0.57 IU/mL (ref 0.30–0.70)
Heparin Unfractionated: 0.31 IU/mL (ref 0.30–0.70)

## 2016-01-21 LAB — BASIC METABOLIC PANEL
ANION GAP: 6 (ref 5–15)
BUN: 27 mg/dL — AB (ref 6–20)
CALCIUM: 8.2 mg/dL — AB (ref 8.9–10.3)
CO2: 28 mmol/L (ref 22–32)
Chloride: 108 mmol/L (ref 101–111)
Creatinine, Ser: 1.61 mg/dL — ABNORMAL HIGH (ref 0.61–1.24)
GFR calc Af Amer: 48 mL/min — ABNORMAL LOW (ref >60)
GFR calc non Af Amer: 41 mL/min — ABNORMAL LOW (ref >60)
GLUCOSE: 98 mg/dL (ref 65–99)
Potassium: 4.3 mmol/L (ref 3.5–5.1)
Sodium: 142 mmol/L (ref 135–145)

## 2016-01-21 LAB — BODY FLUID CELL COUNT WITH DIFFERENTIAL
Eos, Fluid: 0 %
Lymphs, Fluid: 47 %
Monocyte-Macrophage-Serous Fluid: 51 % (ref 50–90)
Neutrophil Count, Fluid: 2 % (ref 0–25)
WBC FLUID: 129 uL (ref 0–1000)

## 2016-01-21 LAB — LACTATE DEHYDROGENASE, PLEURAL OR PERITONEAL FLUID: LD, Fluid: 43 U/L — ABNORMAL HIGH (ref 3–23)

## 2016-01-21 LAB — HEPATIC FUNCTION PANEL
ALBUMIN: 2.6 g/dL — AB (ref 3.5–5.0)
ALT: 22 U/L (ref 17–63)
AST: 21 U/L (ref 15–41)
Alkaline Phosphatase: 126 U/L (ref 38–126)
BILIRUBIN DIRECT: 0.3 mg/dL (ref 0.1–0.5)
Indirect Bilirubin: 0.9 mg/dL (ref 0.3–0.9)
TOTAL PROTEIN: 4.8 g/dL — AB (ref 6.5–8.1)
Total Bilirubin: 1.2 mg/dL (ref 0.3–1.2)

## 2016-01-21 LAB — URINE MICROSCOPIC-ADD ON

## 2016-01-21 LAB — ECHOCARDIOGRAM COMPLETE
HEIGHTINCHES: 68 in
WEIGHTICAEL: 2896 [oz_av]

## 2016-01-21 LAB — PROTEIN, BODY FLUID

## 2016-01-21 LAB — GRAM STAIN

## 2016-01-21 LAB — GLUCOSE, SEROUS FLUID: GLUCOSE FL: 112 mg/dL

## 2016-01-21 MED ORDER — CETYLPYRIDINIUM CHLORIDE 0.05 % MT LIQD
7.0000 mL | Freq: Two times a day (BID) | OROMUCOSAL | Status: DC
  Administered 2016-01-21 – 2016-01-26 (×9): 7 mL via OROMUCOSAL

## 2016-01-21 MED ORDER — LEVALBUTEROL HCL 0.63 MG/3ML IN NEBU
0.6300 mg | INHALATION_SOLUTION | Freq: Four times a day (QID) | RESPIRATORY_TRACT | Status: DC
  Administered 2016-01-21 – 2016-01-24 (×10): 0.63 mg via RESPIRATORY_TRACT
  Filled 2016-01-21 (×11): qty 3

## 2016-01-21 NOTE — Progress Notes (Signed)
Patient ID: Ricardo Garrett, male   DOB: 1946/06/03, 70 y.o.   MRN: [id number]    Subjective:  Dyspnea improved   Objective:  Filed Vitals:   01/20/16 1946 01/20/16 2059 01/21/16 0500 01/21/16 0542  BP:  131/83 98/58 122/92  Pulse:  73 71 74  Temp:  98.2 F (36.8 C) 97.9 F (36.6 C)   TempSrc:  Oral Oral   Resp:  18 18   Height:      Weight:      SpO2: 92% 99% 98%     Intake/Output from previous day:  Intake/Output Summary (Last 24 hours) at 01/21/16 0809 Last data filed at 01/21/16 0500  Gross per 24 hour  Intake    240 ml  Output    650 ml  Net   -410 ml    Physical Exam: Affect appropriate Chronically ill white male  HEENT: poor dentition  Neck supple with no adenopathy JVP normal no bruits no thyromegaly Lungs bilateral rhochi no  wheezing and good diaphragmatic motion Heart:  S1/S2 no murmur, no rub, gallop or click PMI normal Abdomen: benighn, BS positve, no tenderness, no AAA no bruit.  No HSM or HJR Distal pulses intact with no bruits Plus 2 bilateral edema Neuro non-focal Skin warm and dry No muscular weakness   Lab Results: Basic Metabolic Panel:  Recent Labs  01/21/16 0537  NA 142  K 4.3  CL 108  CO2 28  GLUCOSE 98  BUN 27*  CREATININE 1.61*  CALCIUM 8.2*   CBC:  Recent Labs  01/20/16 1654 01/21/16 0537  WBC 9.9 8.4  HGB 15.0 13.8  HCT 46.3 43.6  MCV 91.5 92.8  PLT 302 260   Thyroid Function Tests:  Recent Labs  01/20/16 1654  TSH 3.998   Anemia Panel: No results for input(s): VITAMINB12, FOLATE, FERRITIN, TIBC, IRON, RETICCTPCT in the last 72 hours.  Imaging: Dg Chest Port 1 View  01/20/2016  CLINICAL DATA:  Shortness of breath and wheezing for several months. Tachycardia. EXAM: PORTABLE CHEST 1 VIEW COMPARISON:  CT chest 11/23/2015.  Chest 03/14/2013. FINDINGS: Persistent finding since recent chest CT with right lower lobe collapse and consolidation and right pleural effusion. Consolidative changes are progressing  since the previous CT. Left lung is clear. Cardiac enlargement. No pneumothorax. IMPRESSION: Increasing consolidation in the right lower lung with persistent right pleural effusion. Followup PA and lateral chest X-ray is recommended in 3-4 weeks following trial of antibiotic therapy to ensure resolution and exclude underlying malignancy. Electronically Signed   By: Lucienne Capers M.D.   On: 01/20/2016 18:34    Cardiac Studies:  ECG: rapid afib rate 156 01/20/16     Telemetry:  afib rate 80-90    Echo: pending   Medications:   . ALPRAZolam  0.5 mg Oral BID  . antiseptic oral rinse  7 mL Mouth Rinse BID  . arformoterol  15 mcg Nebulization BID   And  . budesonide (PULMICORT) nebulizer solution  0.5 mg Nebulization BID  . atorvastatin  20 mg Oral q1800  . diltiazem  60 mg Oral 4 times per day  . furosemide  80 mg Intravenous BID  . levalbuterol  0.63 mg Nebulization Q6H WA  . sertraline  50 mg Oral Daily  . sodium chloride flush  3 mL Intravenous Q12H  . tiotropium  18 mcg Inhalation Daily     . heparin 1,200 Units/hr (01/21/16 0546)    Assessment/Plan:  Afib:  Rate well controlled on PO meds.  Heparin would not start oral anticoagulation until pulmonary issues further identified  Likely use coumadin  Given elevated Cr  CHF:  Continue lasix  Echo today   Pulmonay:  Consider repeat CT and consult Dr Luan Pulling may need bronchoscopy  ? Start antibiotics    Cholesterol:  On statin   Ricardo Garrett 01/21/2016, 8:09 AM

## 2016-01-21 NOTE — Consult Note (Signed)
Consult requested by: [facility] Consult requested for COPD exacerbation and abnormal chest CT:  HPI: This is a 70 year old who has a significant smoking history with about a 60-80 pack year smoking history and also history of exposures to multiple chemicals and at least to some extent to asbestos. He had chest CT about 2 months ago that showed pulmonary nodules and a right greater than left pleural effusion. Since then he has developed increasing problems with shortness of breath and eventually came to the emergency department where he was found to have anasarca. Outpatient diuresis was attempted but not successful. He also had atrial fibrillation with rapid ventricular response. He says he feels a little better this morning. He has been on heparin.  Past Medical History  Diagnosis Date  . High cholesterol   . Broken arm      History reviewed. No pertinent family history.   Social History   Social History  . Marital Status: Married    Spouse Name: N/A  . Number of Children: N/A  . Years of Education: N/A   Social History Main Topics  . Smoking status: Former Research scientist (life sciences)  . Smokeless tobacco: None  . Alcohol Use: Yes     Comment: occasional   . Drug Use: Yes    Special: Marijuana  . Sexual Activity: Not Asked   Other Topics Concern  . None   Social History Narrative     ROS: He denies chest pain. He has no pain anywhere. He has noticed that his legs are been swollen for several weeks. He said his scrotum was swollen. He's had some abdominal swelling. He's been coughing up some sputum. He noticed that he had some palpitations. No nausea vomiting diarrhea or any other symptoms.    Objective: Vital signs in last 24 hours: Temp:  [97.9 F (36.6 C)-98.2 F (36.8 C)] 97.9 F (36.6 C) (03/23 0500) Pulse Rate:  [71-150] 74 (03/23 0542) Resp:  [18] 18 (03/23 0500) BP: (98-137)/(58-98) 122/92 mmHg (03/23 0542) SpO2:  [88 %-99 %] 98 % (03/23 0500) Weight:  [82.101 kg (181  lb)-84.641 kg (186 lb 9.6 oz)] 82.101 kg (181 lb) (03/22 1445) Weight change:  Last BM Date: 01/20/16  Intake/Output from previous day: 03/22 0701 - 03/23 0700 In: 240 [P.O.:240] Out: 650 [Urine:650]  PHYSICAL EXAM He is awake and alert. He is very hard of hearing. His pupils are reactive nodes and throat are clear mucous membranes are moist. His neck is supple without masses. His chest shows rhonchi and some wheezing bilaterally. His heart is irregular with no gallop he has somewhat diminished breath sounds on the right. His abdomen is soft with some questionable fluid in the abdominal cavity. His scrotum is still swollen. He has 2+ edema of his legs. Central nervous system examination with the exception of his hearing is grossly intact  Lab Results: Basic Metabolic Panel:  Recent Labs  01/21/16 0537  NA 142  K 4.3  CL 108  CO2 28  GLUCOSE 98  BUN 27*  CREATININE 1.61*  CALCIUM 8.2*   Liver Function Tests: No results for input(s): AST, ALT, ALKPHOS, BILITOT, PROT, ALBUMIN in the last 72 hours. No results for input(s): LIPASE, AMYLASE in the last 72 hours. No results for input(s): AMMONIA in the last 72 hours. CBC:  Recent Labs  01/20/16 1654 01/21/16 0537  WBC 9.9 8.4  HGB 15.0 13.8  HCT 46.3 43.6  MCV 91.5 92.8  PLT 302 260   Cardiac Enzymes: No results for  input(s): CKTOTAL, CKMB, CKMBINDEX, TROPONINI in the last 72 hours. BNP: No results for input(s): PROBNP in the last 72 hours. D-Dimer: No results for input(s): DDIMER in the last 72 hours. CBG: No results for input(s): GLUCAP in the last 72 hours. Hemoglobin A1C: No results for input(s): HGBA1C in the last 72 hours. Fasting Lipid Panel: No results for input(s): CHOL, HDL, LDLCALC, TRIG, CHOLHDL, LDLDIRECT in the last 72 hours. Thyroid Function Tests:  Recent Labs  01/20/16 1654  TSH 3.998   Anemia Panel: No results for input(s): VITAMINB12, FOLATE, FERRITIN, TIBC, IRON, RETICCTPCT in the last 72  hours. Coagulation:  Recent Labs  01/20/16 1654  LABPROT 14.5  INR 1.11   Urine Drug Screen: Drugs of Abuse  No results found for: LABOPIA, COCAINSCRNUR, LABBENZ, AMPHETMU, THCU, LABBARB  Alcohol Level: No results for input(s): ETH in the last 72 hours. Urinalysis: No results for input(s): COLORURINE, LABSPEC, PHURINE, GLUCOSEU, HGBUR, BILIRUBINUR, KETONESUR, PROTEINUR, UROBILINOGEN, NITRITE, LEUKOCYTESUR in the last 72 hours.  Invalid input(s): APPERANCEUR Misc. Labs:   ABGS: No results for input(s): PHART, PO2ART, TCO2, HCO3 in the last 72 hours.  Invalid input(s): PCO2   MICROBIOLOGY: No results found for this or any previous visit (from the past 240 hour(s)).  Studies/Results: Dg Chest Port 1 View  01/20/2016  CLINICAL DATA:  Shortness of breath and wheezing for several months. Tachycardia. EXAM: PORTABLE CHEST 1 VIEW COMPARISON:  CT chest 11/23/2015.  Chest 03/14/2013. FINDINGS: Persistent finding since recent chest CT with right lower lobe collapse and consolidation and right pleural effusion. Consolidative changes are progressing since the previous CT. Left lung is clear. Cardiac enlargement. No pneumothorax. IMPRESSION: Increasing consolidation in the right lower lung with persistent right pleural effusion. Followup PA and lateral chest X-ray is recommended in 3-4 weeks following trial of antibiotic therapy to ensure resolution and exclude underlying malignancy. Electronically Signed   By: Lucienne Capers M.D.   On: 01/20/2016 18:34    Medications:  Prior to Admission:  Prescriptions prior to admission  Medication Sig Dispense Refill Last Dose  . acetaminophen (TYLENOL) 325 MG tablet Take 2 tablets (650 mg total) by mouth every 6 (six) hours as needed (mild pain).   unknown  . alfuzosin (UROXATRAL) 10 MG 24 hr tablet Take 10 mg by mouth daily with breakfast.    unknown  . ALPRAZolam (XANAX) 0.5 MG tablet Take 0.5 mg by mouth 2 (two) times daily.   01/19/2016 at  Unknown time  . atorvastatin (LIPITOR) 20 MG tablet Take 20 mg by mouth daily.   01/19/2016 at Unknown time  . BREO ELLIPTA 200-25 MCG/INH AEPB Inhale 1 puff into the lungs daily.    01/20/2016 at Unknown time  . furosemide (LASIX) 40 MG tablet Take 40 mg by mouth daily. Reported on 01/20/2016   unknown  . HYDROcodone-acetaminophen (NORCO/VICODIN) 5-325 MG per tablet Take 1 tablet every 6 hours or take 2 tablets every 6 hours as needed for severe pain 240 tablet 0 Past Week at Unknown time  . naproxen (NAPROSYN) 500 MG tablet Take 500 mg by mouth 2 (two) times daily with a meal.    unknown  . sertraline (ZOLOFT) 50 MG tablet Take 50 mg by mouth daily.   unknown  . SPIRIVA HANDIHALER 18 MCG inhalation capsule    01/20/2016 at Unknown time   Scheduled: . ALPRAZolam  0.5 mg Oral BID  . antiseptic oral rinse  7 mL Mouth Rinse BID  . arformoterol  15 mcg Nebulization BID  And  . budesonide (PULMICORT) nebulizer solution  0.5 mg Nebulization BID  . atorvastatin  20 mg Oral q1800  . diltiazem  60 mg Oral 4 times per day  . furosemide  80 mg Intravenous BID  . levalbuterol  0.63 mg Nebulization Q6H WA  . sertraline  50 mg Oral Daily  . sodium chloride flush  3 mL Intravenous Q12H  . tiotropium  18 mcg Inhalation Daily   Continuous: . heparin 1,200 Units/hr (01/21/16 0546)   [id number]  Assesment: He was admitted with COPD exacerbation, anasarca and atrial fibrillation with rapid ventricular response. He has a significant right pleural effusion. He is high risk for malignancy. This may be related more to his anasarca and cardiac situation but we may want to go ahead and have him get a thoracentesis while he is on heparin and before he gets fully anticoagulated on other agents. He is on appropriate treatment for COPD. Active Problems:   Anticoagulated   COPD (chronic obstructive pulmonary disease) (HCC)   Anasarca   CKD (chronic kidney disease) stage 3, GFR 30-59 ml/min    Hypoalbuminemia   Atrial fibrillation with rapid ventricular response ([place])    Plan: I will discuss with DrRoderic Palau. Continue treatment for COPD continue diuresis etc. Consider thoracentesis   LOS: 1 day   Jerre Vandrunen L 01/21/2016, 8:48 AM

## 2016-01-21 NOTE — Progress Notes (Signed)
Thoracentesis complete no signs of distress. 550ml yellow colored ascites removed.

## 2016-01-21 NOTE — Progress Notes (Signed)
ANTICOAGULATION CONSULT NOTE   Pharmacy Consult for Heparin Indication: atrial fibrillation  No Known Allergies  Patient Measurements:  Heparin Dosing Weight: 84.6kg  Vital Signs: Temp: 97.9 F (36.6 C) (03/23 0500) Temp Source: Oral (03/23 0500) BP: 122/92 mmHg (03/23 0542) Pulse Rate: 74 (03/23 0542)  Labs:  Recent Labs  01/20/16 1654 01/21/16 0050 01/21/16 0537 01/21/16 1052  HGB 15.0  --  13.8  --   HCT 46.3  --  43.6  --   PLT 302  --  260  --   LABPROT 14.5  --   --   --   INR 1.11  --   --   --   HEPARINUNFRC  --  0.57  --  0.31  CREATININE  --   --  1.61*  --     Estimated Creatinine Clearance: 44 mL/min (by C-G formula based on Cr of 1.61).   Medical History: Past Medical History  Diagnosis Date  . High cholesterol   . Broken arm    Medications:  See med rec  Assessment: 70 yo male with heart failure, with increasing dyspnea and edema also has afib of which the duration is unknown.  Started on heparin drip yesterday afternoon.  Initial HL was therapeutic at 0.57 and remains therapeutic at 0.31 units/ml.  Plan to start oral agent once pulmonary issues resolved.  Goal of Therapy:  Heparin level 0.3-0.7 units/ml Monitor platelets by anticoagulation protocol: Yes   Plan:  Cont heparin infusion at 1200 units/hr Check anti-Xa level daily while on heparin Continue to monitor H&H and platelets  Excell Seltzer, Pharm D Clinical Pharmacist 01/21/2016,11:49 AM

## 2016-01-21 NOTE — Progress Notes (Signed)
[facility] PROGRESS NOTE  Ricardo Garrett [id number] DOB: Aug 29, 1946 DOA: 01/20/2016 PCP: Wende Neighbors, MD  Assessment/Plan: 1. COPD exacerbation. Pt has a significant smoking hx. Continue nebs. Dr. Luan Pulling has been consulted.  2. Significant right pleural effusion. Etiologies include CHF versus underlying malignancy. Will perform thoracentesis 3/23. 3. CKD stage 3. Creatinine 1.61. Avoid nephrotoxic drugs. NSAIDS have been discontinued. 4. Afib with RVR. CHADs of 3-4. Rate is improved with oral diltiazem. He is anticoagulated with Heparin. Continue current meds. ECHO as below 5. Acute systolic CHF. Pt is on lasix 80mg  iv bid. Reports improvements in overall edema. Urine output does not appear to be impressive based on intake/output. Will continue current tx since he is feeling clinically improved. He  6. Anasarca. Continue diuresis. Will check serum albumin. 24 hr urine protein has been ordered. Follow I&Os. Follow potassium. Cardiology is following. ECHO as below. 7. HLD. Continue statins  Code Status: Full DVT prophylaxis: Heparin Family Communication: discussed with patient Disposition Plan: discharge once improved   Consultants:  Pulmonology  Procedures:  ECHO  - Left ventricle: The cavity size was mildly dilated. Wall  thickness was increased in a pattern of moderate LVH. Systolic  function was moderately reduced. The estimated ejection fraction  was in the range of 35% to 40%. Diffuse hypokinesis. - Mitral valve: There was mild regurgitation. - Left atrium: The atrium was mildly dilated. - Right ventricle: The cavity size was mildly dilated. - Right atrium: The atrium was mildly dilated. - Atrial septum: No defect or patent foramen ovale was identified. - Pulmonary arteries: PA peak pressure: 47 mm Hg (S).  Antibiotics:  None  HPI/Subjective: Breathing was strained yesterday. Denies any previous edema. Believes that lower extremity edema is improving. Is  not urinating very much. Overall, he feels a lot better.   Objective: Filed Vitals:   01/21/16 0500 01/21/16 0542  BP: 98/58 122/92  Pulse: 71 74  Temp: 97.9 F (36.6 C)   Resp: 18     Intake/Output Summary (Last 24 hours) at 01/21/16 0735 Last data filed at 01/21/16 0500  Gross per 24 hour  Intake    240 ml  Output    650 ml  Net   -410 ml   Filed Weights   01/20/16 1445  Weight: 82.101 kg (181 lb)    Exam:  General: NAD, looks comfortable Cardiovascular: irregular Respiratory: diminished breath sounds on the right side. No wheeze Abdomen: soft, non tender, no distention , bowel sounds normal Musculoskeletal: 1+ edema b/l   Data Reviewed: Basic Metabolic Panel:  Recent Labs Lab 01/21/16 0537  NA 142  K 4.3  CL 108  CO2 28  GLUCOSE 98  BUN 27*  CREATININE 1.61*  CALCIUM 8.2*   Liver Function Tests: No results for input(s): AST, ALT, ALKPHOS, BILITOT, PROT, ALBUMIN in the last 168 hours. No results for input(s): LIPASE, AMYLASE in the last 168 hours. No results for input(s): AMMONIA in the last 168 hours. CBC:  Recent Labs Lab 01/20/16 1654 01/21/16 0537  WBC 9.9 8.4  HGB 15.0 13.8  HCT 46.3 43.6  MCV 91.5 92.8  PLT 302 260   Cardiac Enzymes: No results for input(s): CKTOTAL, CKMB, CKMBINDEX, TROPONINI in the last 168 hours. BNP (last 3 results) No results for input(s): BNP in the last 8760 hours.  ProBNP (last 3 results) No results for input(s): PROBNP in the last 8760 hours.  CBG: No results for input(s): GLUCAP in the last 168 hours.  No results found for  this or any previous visit (from the past 240 hour(s)).   Studies: Dg Chest Port 1 View  01/20/2016  CLINICAL DATA:  Shortness of breath and wheezing for several months. Tachycardia. EXAM: PORTABLE CHEST 1 VIEW COMPARISON:  CT chest 11/23/2015.  Chest 03/14/2013. FINDINGS: Persistent finding since recent chest CT with right lower lobe collapse and consolidation and right pleural  effusion. Consolidative changes are progressing since the previous CT. Left lung is clear. Cardiac enlargement. No pneumothorax. IMPRESSION: Increasing consolidation in the right lower lung with persistent right pleural effusion. Followup PA and lateral chest X-ray is recommended in 3-4 weeks following trial of antibiotic therapy to ensure resolution and exclude underlying malignancy. Electronically Signed   By: Lucienne Capers M.D.   On: 01/20/2016 18:34    Scheduled Meds: . ALPRAZolam  0.5 mg Oral BID  . antiseptic oral rinse  7 mL Mouth Rinse BID  . arformoterol  15 mcg Nebulization BID   And  . budesonide (PULMICORT) nebulizer solution  0.5 mg Nebulization BID  . atorvastatin  20 mg Oral q1800  . diltiazem  60 mg Oral 4 times per day  . furosemide  80 mg Intravenous BID  . levalbuterol  0.63 mg Nebulization Q6H WA  . sertraline  50 mg Oral Daily  . sodium chloride flush  3 mL Intravenous Q12H  . tiotropium  18 mcg Inhalation Daily   Continuous Infusions: . heparin 1,200 Units/hr (01/21/16 0546)    Active Problems:   Anticoagulated   COPD (chronic obstructive pulmonary disease) (HCC)   Anasarca   CKD (chronic kidney disease) stage 3, GFR 30-59 ml/min   Hypoalbuminemia   Atrial fibrillation with rapid ventricular response ([place])    Time spent: 25 minutes    Kathie Dike, MD. [facility] Pager [phone]. If 7PM-7AM, please contact night-coverage at [email or web address], password [facility] 01/21/2016, 7:35 AM  LOS: 1 day    By signing my name below, I, Delene Ruffini, attest that this documentation has been prepared under the direction and in the presence of Kathie Dike, MD. Electronically Signed: Delene Ruffini 01/21/2016 11:40am I, Dr. Kathie Dike, personally performed the services described in this documentaiton. All medical record entries made by the scribe were at my direction and in my presence. I have reviewed the chart and agree that the record reflects my  personal performance and is accurate and complete  Kathie Dike, MD, 01/21/2016 11:58 AM

## 2016-01-22 DIAGNOSIS — I5021 Acute systolic (congestive) heart failure: Secondary | ICD-10-CM

## 2016-01-22 LAB — CBC
HCT: 43.1 % (ref 39.0–52.0)
Hemoglobin: 13.7 g/dL (ref 13.0–17.0)
MCH: 29.1 pg (ref 26.0–34.0)
MCHC: 31.8 g/dL (ref 30.0–36.0)
MCV: 91.7 fL (ref 78.0–100.0)
Platelets: 251 10*3/uL (ref 150–400)
RBC: 4.7 MIL/uL (ref 4.22–5.81)
RDW: 15.1 % (ref 11.5–15.5)
WBC: 9.4 10*3/uL (ref 4.0–10.5)

## 2016-01-22 LAB — BASIC METABOLIC PANEL
ANION GAP: 9 (ref 5–15)
BUN: 30 mg/dL — ABNORMAL HIGH (ref 6–20)
CALCIUM: 8.4 mg/dL — AB (ref 8.9–10.3)
CO2: 26 mmol/L (ref 22–32)
Chloride: 105 mmol/L (ref 101–111)
Creatinine, Ser: 1.62 mg/dL — ABNORMAL HIGH (ref 0.61–1.24)
GFR, EST AFRICAN AMERICAN: 48 mL/min — AB (ref >60)
GFR, EST NON AFRICAN AMERICAN: 41 mL/min — AB (ref >60)
GLUCOSE: 105 mg/dL — AB (ref 65–99)
POTASSIUM: 4.1 mmol/L (ref 3.5–5.1)
Sodium: 140 mmol/L (ref 135–145)

## 2016-01-22 LAB — HEPARIN LEVEL (UNFRACTIONATED): Heparin Unfractionated: 0.39 [IU]/mL (ref 0.30–0.70)

## 2016-01-22 MED ORDER — LEVALBUTEROL HCL 0.63 MG/3ML IN NEBU: 0.6300 mg | INHALATION_SOLUTION | RESPIRATORY_TRACT | Status: DC | PRN

## 2016-01-22 MED ORDER — GUAIFENESIN ER 600 MG PO TB12
1200.0000 mg | ORAL_TABLET | Freq: Two times a day (BID) | ORAL | Status: DC
  Administered 2016-01-22 – 2016-01-26 (×9): 1200 mg via ORAL
  Filled 2016-01-22 (×9): qty 2

## 2016-01-22 MED ORDER — APIXABAN 5 MG PO TABS
5.0000 mg | ORAL_TABLET | Freq: Two times a day (BID) | ORAL | Status: DC
  Administered 2016-01-22 – 2016-01-26 (×10): 5 mg via ORAL
  Filled 2016-01-22 (×9): qty 1

## 2016-01-22 MED ORDER — METOPROLOL SUCCINATE ER 25 MG PO TB24
37.5000 mg | ORAL_TABLET | Freq: Two times a day (BID) | ORAL | Status: DC
  Administered 2016-01-22 – 2016-01-26 (×9): 37.5 mg via ORAL
  Filled 2016-01-22 (×9): qty 2

## 2016-01-22 NOTE — Progress Notes (Signed)
Subjective: He says he feels better. He looks better. His pleural fluid appeared to be transudate   Objective: Vital signs in last 24 hours: Temp:  [97.2 F (36.2 C)-97.9 F (36.6 C)] 97.4 F (36.3 C) (03/24 0725) Pulse Rate:  [61-95] 61 (03/24 0725) Resp:  [20] 20 (03/24 0725) BP: (95-125)/(64-90) 125/72 mmHg (03/24 0725) SpO2:  [86 %-99 %] 94 % (03/24 0736) Weight change:  Last BM Date: 01/21/16  Intake/Output from previous day: 03/23 0701 - 03/24 0700 In: 3 [I.V.:3] Out: 1400 [Urine:1400]  PHYSICAL EXAM General appearance: alert, cooperative and no distress Resp: rhonchi bilaterally Cardio: irregularly irregular rhythm GI: soft, non-tender; bowel sounds normal; no masses,  no organomegaly Extremities: He still has significant edema but it is better  Lab Results:  Results for orders placed or performed during the hospital encounter of 01/20/16 (from the past 48 hour(s))  TSH     Status: None   Collection Time: 01/20/16  4:54 PM  Result Value Ref Range   TSH 3.998 0.350 - 4.500 uIU/mL  CBC     Status: None   Collection Time: 01/20/16  4:54 PM  Result Value Ref Range   WBC 9.9 4.0 - 10.5 K/uL   RBC 5.06 4.22 - 5.81 MIL/uL   Hemoglobin 15.0 13.0 - 17.0 g/dL   HCT 46.3 39.0 - 52.0 %   MCV 91.5 78.0 - 100.0 fL   MCH 29.6 26.0 - 34.0 pg   MCHC 32.4 30.0 - 36.0 g/dL   RDW 15.3 11.5 - 15.5 %   Platelets 302 150 - 400 K/uL  Protime-INR     Status: None   Collection Time: 01/20/16  4:54 PM  Result Value Ref Range   Prothrombin Time 14.5 11.6 - 15.2 seconds   INR 1.11 0.00 - 1.49  Heparin level (unfractionated)     Status: None   Collection Time: 01/21/16 12:50 AM  Result Value Ref Range   Heparin Unfractionated 0.57 0.30 - 0.70 IU/mL    Comment:        IF HEPARIN RESULTS ARE BELOW EXPECTED VALUES, AND PATIENT DOSAGE HAS BEEN CONFIRMED, SUGGEST FOLLOW UP TESTING OF ANTITHROMBIN III LEVELS.   Basic metabolic panel     Status: Abnormal   Collection Time: 01/21/16   5:37 AM  Result Value Ref Range   Sodium 142 135 - 145 mmol/L   Potassium 4.3 3.5 - 5.1 mmol/L   Chloride 108 101 - 111 mmol/L   CO2 28 22 - 32 mmol/L   Glucose, Bld 98 65 - 99 mg/dL   BUN 27 (H) 6 - 20 mg/dL   Creatinine, Ser 1.61 (H) 0.61 - 1.24 mg/dL   Calcium 8.2 (L) 8.9 - 10.3 mg/dL   GFR calc non Af Amer 41 (L) >60 mL/min   GFR calc Af Amer 48 (L) >60 mL/min    Comment: (NOTE) The eGFR has been calculated using the CKD EPI equation. This calculation has not been validated in all clinical situations. eGFR's persistently <60 mL/min signify possible Chronic Kidney Disease.    Anion gap 6 5 - 15  CBC     Status: None   Collection Time: 01/21/16  5:37 AM  Result Value Ref Range   WBC 8.4 4.0 - 10.5 K/uL   RBC 4.70 4.22 - 5.81 MIL/uL   Hemoglobin 13.8 13.0 - 17.0 g/dL   HCT 43.6 39.0 - 52.0 %   MCV 92.8 78.0 - 100.0 fL   MCH 29.4 26.0 - 34.0 pg  MCHC 31.7 30.0 - 36.0 g/dL   RDW 15.4 11.5 - 15.5 %   Platelets 260 150 - 400 K/uL  Heparin level (unfractionated)     Status: None   Collection Time: 01/21/16 10:52 AM  Result Value Ref Range   Heparin Unfractionated 0.31 0.30 - 0.70 IU/mL    Comment:        IF HEPARIN RESULTS ARE BELOW EXPECTED VALUES, AND PATIENT DOSAGE HAS BEEN CONFIRMED, SUGGEST FOLLOW UP TESTING OF ANTITHROMBIN III LEVELS.   Hepatic function panel     Status: Abnormal   Collection Time: 01/21/16 10:52 AM  Result Value Ref Range   Total Protein 4.8 (L) 6.5 - 8.1 g/dL   Albumin 2.6 (L) 3.5 - 5.0 g/dL   AST 21 15 - 41 U/L   ALT 22 17 - 63 U/L   Alkaline Phosphatase 126 38 - 126 U/L   Total Bilirubin 1.2 0.3 - 1.2 mg/dL   Bilirubin, Direct 0.3 0.1 - 0.5 mg/dL   Indirect Bilirubin 0.9 0.3 - 0.9 mg/dL  Lactate dehydrogenase (CSF, pleural or peritoneal fluid)     Status: Abnormal   Collection Time: 01/21/16 12:45 PM  Result Value Ref Range   LD, Fluid 43 (H) 3 - 23 U/L    Comment: (NOTE) Results should be evaluated in conjunction with serum values     Fluid Type-FLDH FLUID     Comment: RIGHT PLEURAL COLLECTED BY DOCTOR CORRECTED ON 03/23 AT 1423: PREVIOUSLY REPORTED AS Pleural R   Protein, pleural or peritoneal fluid     Status: None   Collection Time: 01/21/16 12:45 PM  Result Value Ref Range   Total protein, fluid <3.0 g/dL    Comment: (NOTE) No normal range established for this test Results should be evaluated in conjunction with serum values    Fluid Type-FTP FLUID     Comment: RIGHT PLEURAL COLLECTED BY DOCTOR CORRECTED ON 03/23 AT 1424: PREVIOUSLY REPORTED AS Pleural R   Body fluid cell count with differential     Status: None   Collection Time: 01/21/16 12:45 PM  Result Value Ref Range   Fluid Type-FCT FLUID     Comment: RIGHT PLEURAL COLLECTED BY DOCTOR CORRECTED ON 03/23 AT 1423: PREVIOUSLY REPORTED AS Pleural R    Color, Fluid YELLOW YELLOW   Appearance, Fluid CLEAR CLEAR   WBC, Fluid 129 0 - 1000 cu mm   Neutrophil Count, Fluid 2 0 - 25 %   Lymphs, Fluid 47 %   Monocyte-Macrophage-Serous Fluid 51 50 - 90 %   Eos, Fluid 0 %   Other Cells, Fluid FEW %    Comment: OTHER CELLS IDENTIFIED AS MESOTHELIAL CELLS CYTOLOGY TO FOLLOW.   Glucose, pleural or peritoneal fluid     Status: None   Collection Time: 01/21/16 12:45 PM  Result Value Ref Range   Glucose, Fluid 112 mg/dL    Comment: (NOTE) No normal range established for this test Results should be evaluated in conjunction with serum values    Fluid Type-FGLU FLUID     Comment: RIGHT ASCITIC COLLECTED BY DOCTOR CORRECTED ON 03/23 AT 1428: PREVIOUSLY REPORTED AS Pleural R   Gram stain     Status: None   Collection Time: 01/21/16 12:45 PM  Result Value Ref Range   Specimen Description FLUID RIGHT PLEURAL COLLECTED BY DOCTOR KRISHNAN    Special Requests NONE    Gram Stain      CYTOSPIN SMEAR WBC PRESENT, PREDOMINANTLY MONONUCLEAR NO ORGANISMS SEEN Performed at [facility]    Report Status 01/21/2016 FINAL   Urinalysis, Routine w reflex  microscopic (not at [facility])     Status: Abnormal   Collection Time: 01/21/16  2:25 PM  Result Value Ref Range   Color, Urine YELLOW YELLOW   APPearance CLEAR CLEAR   Specific Gravity, Urine 1.020 1.005 - 1.030   pH 5.0 5.0 - 8.0   Glucose, UA NEGATIVE NEGATIVE mg/dL   Hgb urine dipstick SMALL (A) NEGATIVE   Bilirubin Urine NEGATIVE NEGATIVE   Ketones, ur NEGATIVE NEGATIVE mg/dL   Protein, ur NEGATIVE NEGATIVE mg/dL   Nitrite NEGATIVE NEGATIVE   Leukocytes, UA NEGATIVE NEGATIVE  Urine microscopic-add on     Status: Abnormal   Collection Time: 01/21/16  2:25 PM  Result Value Ref Range   Squamous Epithelial / LPF 0-5 (A) NONE SEEN   WBC, UA 0-5 0 - 5 WBC/hpf   RBC / HPF 0-5 0 - 5 RBC/hpf   Bacteria, UA RARE (A) NONE SEEN  Basic metabolic panel     Status: Abnormal   Collection Time: 01/22/16  6:09 AM  Result Value Ref Range   Sodium 140 135 - 145 mmol/L   Potassium 4.1 3.5 - 5.1 mmol/L   Chloride 105 101 - 111 mmol/L   CO2 26 22 - 32 mmol/L   Glucose, Bld 105 (H) 65 - 99 mg/dL   BUN 30 (H) 6 - 20 mg/dL   Creatinine, Ser 1.62 (H) 0.61 - 1.24 mg/dL   Calcium 8.4 (L) 8.9 - 10.3 mg/dL   GFR calc non Af Amer 41 (L) >60 mL/min   GFR calc Af Amer 48 (L) >60 mL/min    Comment: (NOTE) The eGFR has been calculated using the CKD EPI equation. This calculation has not been validated in all clinical situations. eGFR's persistently <60 mL/min signify possible Chronic Kidney Disease.    Anion gap 9 5 - 15  CBC     Status: None   Collection Time: 01/22/16  6:09 AM  Result Value Ref Range   WBC 9.4 4.0 - 10.5 K/uL   RBC 4.70 4.22 - 5.81 MIL/uL   Hemoglobin 13.7 13.0 - 17.0 g/dL   HCT 43.1 39.0 - 52.0 %   MCV 91.7 78.0 - 100.0 fL   MCH 29.1 26.0 - 34.0 pg   MCHC 31.8 30.0 - 36.0 g/dL   RDW 15.1 11.5 - 15.5 %   Platelets 251 150 - 400 K/uL  Heparin level (unfractionated)     Status: None   Collection Time: 01/22/16  6:09 AM  Result Value Ref Range   Heparin Unfractionated 0.39 0.30 -  0.70 IU/mL    Comment:        IF HEPARIN RESULTS ARE BELOW EXPECTED VALUES, AND PATIENT DOSAGE HAS BEEN CONFIRMED, SUGGEST FOLLOW UP TESTING OF ANTITHROMBIN III LEVELS.     ABGS No results for input(s): PHART, PO2ART, TCO2, HCO3 in the last 72 hours.  Invalid input(s): PCO2 CULTURES Recent Results (from the past 240 hour(s))  Gram stain     Status: None   Collection Time: 01/21/16 12:45 PM  Result Value Ref Range Status   Specimen Description FLUID RIGHT PLEURAL COLLECTED BY [facility]  Final   Special Requests NONE  Final   Gram Stain   Final    CYTOSPIN SMEAR WBC PRESENT, PREDOMINANTLY MONONUCLEAR NO ORGANISMS SEEN Performed at [facility]    Report Status 01/21/2016 FINAL  Final   Studies/Results: Dg Chest 1 View  01/21/2016  CLINICAL DATA:  Post thoracentesis EXAM: CHEST 1 VIEW COMPARISON:  01/20/2016 FINDINGS: Small right pleural effusion, decreased status post thoracentesis. Associated right lower lobe opacity, likely atelectasis. Trace left pleural effusion. No pneumothorax. Cardiomegaly. IMPRESSION: Small right pleural effusion, decreased. No pneumothorax status post thoracentesis. Electronically Signed   By: Julian Hy M.D.   On: 01/21/2016 13:34   Dg Chest Port 1 View  01/20/2016  CLINICAL DATA:  Shortness of breath and wheezing for several months. Tachycardia. EXAM: PORTABLE CHEST 1 VIEW COMPARISON:  CT chest 11/23/2015.  Chest 03/14/2013. FINDINGS: Persistent finding since recent chest CT with right lower lobe collapse and consolidation and right pleural effusion. Consolidative changes are progressing since the previous CT. Left lung is clear. Cardiac enlargement. No pneumothorax. IMPRESSION: Increasing consolidation in the right lower lung with persistent right pleural effusion. Followup PA and lateral chest X-ray is recommended in 3-4 weeks following trial of antibiotic therapy to ensure resolution and exclude underlying malignancy. Electronically  Signed   By: Lucienne Capers M.D.   On: 01/20/2016 18:34   US Thoracentesis Asp Pleural Space W/img Guide  01/21/2016  INDICATION: Right pleural effusion EXAM: ULTRASOUND GUIDED RIGHT THORACENTESIS MEDICATIONS: None. COMPLICATIONS: None immediate. PROCEDURE: An ultrasound guided thoracentesis was thoroughly discussed with the patient and questions answered. The benefits, risks, alternatives and complications were also discussed. The patient understands and wishes to proceed with the procedure. Written consent was obtained. Ultrasound was performed to localize and mark an adequate pocket of fluid in the right chest. The area was then prepped and draped in the normal sterile fashion. 1% Lidocaine was used for local anesthesia. Under ultrasound guidance a 19 gauge, 7-cm, Yueh catheter was introduced. Thoracentesis was performed. The catheter was removed and a dressing applied. FINDINGS: A total of approximately 500 mL of yellow colored fluid was removed. Samples were sent to the laboratory as requested by the clinical team. IMPRESSION: Successful ultrasound guided right thoracentesis yielding 500 mL of pleural fluid. Electronically Signed   By: Julian Hy M.D.   On: 01/21/2016 13:32    Medications:  Prior to Admission:  Prescriptions prior to admission  Medication Sig Dispense Refill Last Dose  . acetaminophen (TYLENOL) 325 MG tablet Take 2 tablets (650 mg total) by mouth every 6 (six) hours as needed (mild pain).   unknown  . alfuzosin (UROXATRAL) 10 MG 24 hr tablet Take 10 mg by mouth daily with breakfast.    unknown  . ALPRAZolam (XANAX) 0.5 MG tablet Take 0.5 mg by mouth 2 (two) times daily.   01/19/2016 at Unknown time  . atorvastatin (LIPITOR) 20 MG tablet Take 20 mg by mouth daily.   01/19/2016 at Unknown time  . BREO ELLIPTA 200-25 MCG/INH AEPB Inhale 1 puff into the lungs daily.    01/20/2016 at Unknown time  . furosemide (LASIX) 40 MG tablet Take 40 mg by mouth daily. Reported on 01/20/2016    unknown  . HYDROcodone-acetaminophen (NORCO/VICODIN) 5-325 MG per tablet Take 1 tablet every 6 hours or take 2 tablets every 6 hours as needed for severe pain 240 tablet 0 Past Week at Unknown time  . naproxen (NAPROSYN) 500 MG tablet Take 500 mg by mouth 2 (two) times daily with a meal.    unknown  . sertraline (ZOLOFT) 50 MG tablet Take 50 mg by mouth daily.   unknown  . SPIRIVA HANDIHALER 18 MCG inhalation capsule    01/20/2016 at Unknown time   Scheduled: . ALPRAZolam  0.5 mg Oral BID  .  antiseptic oral rinse  7 mL Mouth Rinse BID  . arformoterol  15 mcg Nebulization BID   And  . budesonide (PULMICORT) nebulizer solution  0.5 mg Nebulization BID  . atorvastatin  20 mg Oral q1800  . diltiazem  60 mg Oral 4 times per day  . furosemide  80 mg Intravenous BID  . levalbuterol  0.63 mg Nebulization Q6H WA  . sertraline  50 mg Oral Daily  . sodium chloride flush  3 mL Intravenous Q12H  . tiotropium  18 mcg Inhalation Daily   Continuous: . heparin 1,200 Units/hr (01/21/16 1409)   [id number]  Assesment:He was admitted with anasarca, COPD exacerbation and atrial fibrillation with rapid ventricular response. He has a right pleural effusion and that seems to be a transudate probably related to his heart failure. Active Problems:   Anticoagulated   COPD (chronic obstructive pulmonary disease) (HCC)   Anasarca   CKD (chronic kidney disease) stage 3, GFR 30-59 ml/min   Hypoalbuminemia   Atrial fibrillation with rapid ventricular response (HCC)   Acute systolic CHF (congestive heart failure) (HCC)   Pleural effusion on right    Plan: Continue current treatments. Continue diuresis.    LOS: 2 days   Ichelle Harral L 01/22/2016, 9:11 AM

## 2016-01-22 NOTE — Progress Notes (Signed)
ANTICOAGULATION CONSULT NOTE - Initial Consult  Pharmacy Consult for Eliquis (Apixaban) Indication: nonvalvular atrial fibrillation  No Known Allergies  Patient Measurements: Height: 5\' 8"  (172.7 cm) Weight: 181 lb (82.101 kg) IBW/kg (Calculated) : 68.4 Heparin Dosing Weight:   Vital Signs: Temp: 97.4 F (36.3 C) (03/24 0725) Temp Source: Oral (03/24 0725) BP: 125/72 mmHg (03/24 0725) Pulse Rate: 61 (03/24 0725)  Labs:  Recent Labs  01/20/16 1654 01/21/16 0050 01/21/16 0537 01/21/16 1052 01/22/16 0609  HGB 15.0  --  13.8  --  13.7  HCT 46.3  --  43.6  --  43.1  PLT 302  --  260  --  251  LABPROT 14.5  --   --   --   --   INR 1.11  --   --   --   --   HEPARINUNFRC  --  0.57  --  0.31 0.39  CREATININE  --   --  1.61*  --  1.62*    Estimated Creatinine Clearance: 43.7 mL/min (by C-G formula based on Cr of 1.62).   Medical History: Past Medical History  Diagnosis Date  . High cholesterol   . Broken arm     Medications:  Scheduled:  . ALPRAZolam  0.5 mg Oral BID  . antiseptic oral rinse  7 mL Mouth Rinse BID  . apixaban  5 mg Oral BID  . arformoterol  15 mcg Nebulization BID   And  . budesonide (PULMICORT) nebulizer solution  0.5 mg Nebulization BID  . atorvastatin  20 mg Oral q1800  . furosemide  80 mg Intravenous BID  . guaiFENesin  1,200 mg Oral BID  . levalbuterol  0.63 mg Nebulization Q6H WA  . metoprolol succinate  37.5 mg Oral BID  . sertraline  50 mg Oral Daily  . sodium chloride flush  3 mL Intravenous Q12H  . tiotropium  18 mcg Inhalation Daily    Assessment: 70 yo male with nonvalvular atrial fibrillation, presently on heparin. Pharmacy protocol to change therapy to apixaban Age < 80 , weight > 60 kg, SCR 1.62   Goal of Therapy:  Stroke prevention with non valvular atrial fibrillation Monitor platelets by anticoagulation protocol: Yes   Plan:  Discontinue heparin infusion Start Apixaban 5 mg po twice daily Monitor CBC, platelets,  signs of bleeding Labs per protocol  Abner Greenspan, Skylee Baird Bennett 01/22/2016,2:43 PM

## 2016-01-22 NOTE — Progress Notes (Signed)
ANTICOAGULATION CONSULT NOTE   Pharmacy Consult for Heparin Indication: atrial fibrillation  No Known Allergies  Patient Measurements:  Heparin Dosing Weight: 84.6kg  Vital Signs: Temp: 97.4 F (36.3 C) (03/24 0725) Temp Source: Oral (03/24 0725) BP: 125/72 mmHg (03/24 0725) Pulse Rate: 61 (03/24 0725)  Labs:  Recent Labs  01/20/16 1654 01/21/16 0050 01/21/16 0537 01/21/16 1052 01/22/16 0609  HGB 15.0  --  13.8  --  13.7  HCT 46.3  --  43.6  --  43.1  PLT 302  --  260  --  251  LABPROT 14.5  --   --   --   --   INR 1.11  --   --   --   --   HEPARINUNFRC  --  0.57  --  0.31 0.39  CREATININE  --   --  1.61*  --  1.62*   Estimated Creatinine Clearance: 43.7 mL/min (by C-G formula based on Cr of 1.62).  Medical History: Past Medical History  Diagnosis Date  . High cholesterol   . Broken arm    Medications:  See med rec  Assessment: 70 yo male with heart failure, with increasing dyspnea and edema also has afib of which the duration is unknown.  Started on heparin drip.  HL remains therapeutic.  CBC OK.  No bleeding reported.  Plan to start oral agent once pulmonary issues resolved.  Goal of Therapy:  Heparin level 0.3-0.7 units/ml Monitor platelets by anticoagulation protocol: Yes   Plan:  Cont heparin infusion at 1200 units/hr Check anti-Xa level daily while on heparin Continue to monitor H&H and platelets  Hart Robinsons, [place] D Clinical Pharmacist 01/22/2016,9:23 AM

## 2016-01-22 NOTE — Care Management Important Message (Signed)
Important Message  Patient Details  Name: Ricardo Garrett MRN: [id number] Date of Birth: 08/12/1946   Medicare Important Message Given:  Yes    Sherald Barge, RN 01/22/2016, 8:28 AM

## 2016-01-22 NOTE — Progress Notes (Signed)
[facility] PROGRESS NOTE  Ricardo Garrett [id number] DOB: 28-Mar-1946 DOA: 01/20/2016 PCP: Wende Neighbors, MD  Assessment/Plan: 1. COPD exacerbation. Dr. Luan Pulling following. No wheezing at this time. Continue neb tx. 2. Significant right pleural effusion. Etiologies include CHF versus underlying malignancy. Thoracentesis 3/23 yielded 579mL of pleural fluid which is transudative, and likely related to CHF 3. CKD stage 3. Creatinine 1.62. Avoid nephrotoxic drugs. NSAIDS have been discontinued. Continue to follow. 4. Afib with RVR. CHADs of 3-4. Rate is improved with oral diltiazem, but due to low EF, this has been changed to toprol. Will transition to Eliquis. He is anticoagulated with Heparin. Continue current meds. ECHO as below 5. Acute systolic CHF. Pt is on lasix 80mg  IV BID. Reports improvements in overall edema. Urine output does not appear to be impressive based on intake/output, and pt reports as though his urination has become strained. Will continue current tx since he is feeling clinically improved.  6. Anasarca. Continue diuresis. Albumin 2.6. 24 hr urine protein has been ordered, although this will likely be low yield since he does not have any proteinuria on UA. Follow I&Os. Pt reports that his urination has become strained. Will perform bladder US and place foley cath if pt is retaining urine. Card is following. ECHO as below. 7. HLD. Continue statins  Code Status: Full DVT prophylaxis: Heparin Family Communication: discussed with patient  Disposition Plan: discharge once improved   Consultants:  Pulmonology  Procedures:  Thoracentesis 3/23 yielding 500 mL pleural fluid  ECHO  - Left ventricle: The cavity size was mildly dilated. Wall  thickness was increased in a pattern of moderate LVH. Systolic  function was moderately reduced. The estimated ejection fraction  was in the range of 35% to 40%. Diffuse hypokinesis. - Mitral valve: There was mild regurgitation. -  Left atrium: The atrium was mildly dilated. - Right ventricle: The cavity size was mildly dilated. - Right atrium: The atrium was mildly dilated. - Atrial septum: No defect or patent foramen ovale was identified. - Pulmonary arteries: PA peak pressure: 47 mm Hg (S).  Antibiotics:  None  HPI/Subjective: Feels better. After thoracentesis, pt reports that he feels as though his breathing has improved. He is drinking a lot of fluids, but feels as though his urination has become strained.  Objective: Filed Vitals:   01/22/16 0530 01/22/16 0725  BP: 107/90 125/72  Pulse:  61  Temp: 97.2 F (36.2 C) 97.4 F (36.3 C)  Resp: 20 20    Intake/Output Summary (Last 24 hours) at 01/22/16 0747 Last data filed at 01/22/16 0726  Gross per 24 hour  Intake      3 ml  Output   1650 ml  Net  -1647 ml   Filed Weights   01/20/16 1445  Weight: 82.101 kg (181 lb)    Exam:  General: NAD, looks comfortable Cardiovascular: irregular breath sounds Respiratory: diminished sounds at right base Abdomen: soft, non tender, no distention , bowel sounds normal Musculoskeletal: 1-2+ edema b/l   Data Reviewed: Basic Metabolic Panel:  Recent Labs Lab 01/21/16 0537 01/22/16 0609  NA 142 140  K 4.3 4.1  CL 108 105  CO2 28 26  GLUCOSE 98 105*  BUN 27* 30*  CREATININE 1.61* 1.62*  CALCIUM 8.2* 8.4*   Liver Function Tests:  Recent Labs Lab 01/21/16 1052  AST 21  ALT 22  ALKPHOS 126  BILITOT 1.2  PROT 4.8*  ALBUMIN 2.6*   No results for input(s): LIPASE, AMYLASE in the last 168  hours. No results for input(s): AMMONIA in the last 168 hours. CBC:  Recent Labs Lab 01/20/16 1654 01/21/16 0537 01/22/16 0609  WBC 9.9 8.4 9.4  HGB 15.0 13.8 13.7  HCT 46.3 43.6 43.1  MCV 91.5 92.8 91.7  PLT 302 260 251   Cardiac Enzymes: No results for input(s): CKTOTAL, CKMB, CKMBINDEX, TROPONINI in the last 168 hours. BNP (last 3 results) No results for input(s): BNP in the last 8760  hours.  ProBNP (last 3 results) No results for input(s): PROBNP in the last 8760 hours.  CBG: No results for input(s): GLUCAP in the last 168 hours.  Recent Results (from the past 240 hour(s))  Gram stain     Status: None   Collection Time: 01/21/16 12:45 PM  Result Value Ref Range Status   Specimen Description FLUID RIGHT PLEURAL COLLECTED BY [facility]  Final   Special Requests NONE  Final   Gram Stain   Final    CYTOSPIN SMEAR WBC PRESENT, PREDOMINANTLY MONONUCLEAR NO ORGANISMS SEEN Performed at [facility]    Report Status 01/21/2016 FINAL  Final     Studies: Dg Chest 1 View  01/21/2016  CLINICAL DATA:  Post thoracentesis EXAM: CHEST 1 VIEW COMPARISON:  01/20/2016 FINDINGS: Small right pleural effusion, decreased status post thoracentesis. Associated right lower lobe opacity, likely atelectasis. Trace left pleural effusion. No pneumothorax. Cardiomegaly. IMPRESSION: Small right pleural effusion, decreased. No pneumothorax status post thoracentesis. Electronically Signed   By: Julian Hy M.D.   On: 01/21/2016 13:34   Dg Chest Port 1 View  01/20/2016  CLINICAL DATA:  Shortness of breath and wheezing for several months. Tachycardia. EXAM: PORTABLE CHEST 1 VIEW COMPARISON:  CT chest 11/23/2015.  Chest 03/14/2013. FINDINGS: Persistent finding since recent chest CT with right lower lobe collapse and consolidation and right pleural effusion. Consolidative changes are progressing since the previous CT. Left lung is clear. Cardiac enlargement. No pneumothorax. IMPRESSION: Increasing consolidation in the right lower lung with persistent right pleural effusion. Followup PA and lateral chest X-ray is recommended in 3-4 weeks following trial of antibiotic therapy to ensure resolution and exclude underlying malignancy. Electronically Signed   By: Lucienne Capers M.D.   On: 01/20/2016 18:34   US Thoracentesis Asp Pleural Space W/img Guide  01/21/2016  INDICATION: Right pleural  effusion EXAM: ULTRASOUND GUIDED RIGHT THORACENTESIS MEDICATIONS: None. COMPLICATIONS: None immediate. PROCEDURE: An ultrasound guided thoracentesis was thoroughly discussed with the patient and questions answered. The benefits, risks, alternatives and complications were also discussed. The patient understands and wishes to proceed with the procedure. Written consent was obtained. Ultrasound was performed to localize and mark an adequate pocket of fluid in the right chest. The area was then prepped and draped in the normal sterile fashion. 1% Lidocaine was used for local anesthesia. Under ultrasound guidance a 19 gauge, 7-cm, Yueh catheter was introduced. Thoracentesis was performed. The catheter was removed and a dressing applied. FINDINGS: A total of approximately 500 mL of yellow colored fluid was removed. Samples were sent to the laboratory as requested by the clinical team. IMPRESSION: Successful ultrasound guided right thoracentesis yielding 500 mL of pleural fluid. Electronically Signed   By: Julian Hy M.D.   On: 01/21/2016 13:32    Scheduled Meds: . ALPRAZolam  0.5 mg Oral BID  . antiseptic oral rinse  7 mL Mouth Rinse BID  . arformoterol  15 mcg Nebulization BID   And  . budesonide (PULMICORT) nebulizer solution  0.5 mg Nebulization BID  . atorvastatin  20 mg Oral q1800  . diltiazem  60 mg Oral 4 times per day  . furosemide  80 mg Intravenous BID  . levalbuterol  0.63 mg Nebulization Q6H WA  . sertraline  50 mg Oral Daily  . sodium chloride flush  3 mL Intravenous Q12H  . tiotropium  18 mcg Inhalation Daily   Continuous Infusions: . heparin 1,200 Units/hr (01/21/16 1409)    Active Problems:   Anticoagulated   COPD (chronic obstructive pulmonary disease) (HCC)   Anasarca   CKD (chronic kidney disease) stage 3, GFR 30-59 ml/min   Hypoalbuminemia   Atrial fibrillation with rapid ventricular response (HCC)   Acute systolic CHF (congestive heart failure) (HCC)   Pleural  effusion on right    Time spent: 25 minutes    Kathie Dike, MD. [facility] Pager [phone]. If 7PM-7AM, please contact night-coverage at [email or web address], password [facility] 01/22/2016, 7:47 AM  LOS: 2 days    By signing my name below, I, Delene Ruffini, attest that this documentation has been prepared under the direction and in the presence of Kathie Dike, MD. Electronically Signed: Delene Ruffini 01/22/2016 2:00pm  I, Dr. Kathie Dike, personally performed the services described in this documentaiton. All medical record entries made by the scribe were at my direction and in my presence. I have reviewed the chart and agree that the record reflects my personal performance and is accurate and complete  Kathie Dike, MD, 01/22/2016 2:14 PM

## 2016-01-22 NOTE — Progress Notes (Signed)
Patient ID: Kylar Kubesh, male   DOB: [birth year], 70 y.o.   MRN: [id number]     Subjective:    SOB is improving.    Objective:   Temp:  [97.2 F (36.2 C)-97.9 F (36.6 C)] 97.4 F (36.3 C) (03/24 0725) Pulse Rate:  [61-95] 61 (03/24 0725) Resp:  [20] 20 (03/24 0725) BP: (95-125)/(64-90) 125/72 mmHg (03/24 0725) SpO2:  [86 %-99 %] 99 % (03/24 0923) Last BM Date: 01/21/16  Filed Weights   01/20/16 1445  Weight: 181 lb (82.101 kg)    Intake/Output Summary (Last 24 hours) at 01/22/16 0933 Last data filed at 01/22/16 0726  Gross per 24 hour  Intake      3 ml  Output   1650 ml  Net  -1647 ml    Telemetry: afib rates 90s-100s  Exam:  General: NAD  HEENT: sclera clear, throat clear  Resp: crackles bilateral bases  Cardiac: irreg, no m/r/g, no jvd  GI: abdomen soft, NT, ND  MSK: 1-2+ bilateral LE edema  Neuro: no focal deficits  Psych: appropriate affect  Lab Results:  Basic Metabolic Panel:  Recent Labs Lab 01/21/16 0537 01/22/16 0609  NA 142 140  K 4.3 4.1  CL 108 105  CO2 28 26  GLUCOSE 98 105*  BUN 27* 30*  CREATININE 1.61* 1.62*  CALCIUM 8.2* 8.4*    Liver Function Tests:  Recent Labs Lab 01/21/16 1052  AST 21  ALT 22  ALKPHOS 126  BILITOT 1.2  PROT 4.8*  ALBUMIN 2.6*    CBC:  Recent Labs Lab 01/20/16 1654 01/21/16 0537 01/22/16 0609  WBC 9.9 8.4 9.4  HGB 15.0 13.8 13.7  HCT 46.3 43.6 43.1  MCV 91.5 92.8 91.7  PLT 302 260 251    Cardiac Enzymes: No results for input(s): CKTOTAL, CKMB, CKMBINDEX, TROPONINI in the last 168 hours.  BNP: No results for input(s): PROBNP in the last 8760 hours.  Coagulation:  Recent Labs Lab 01/20/16 1654  INR 1.11    ECG:   Medications:   Scheduled Medications: . ALPRAZolam  0.5 mg Oral BID  . antiseptic oral rinse  7 mL Mouth Rinse BID  . arformoterol  15 mcg Nebulization BID   And  . budesonide (PULMICORT) nebulizer solution  0.5 mg Nebulization BID  . atorvastatin  20  mg Oral q1800  . diltiazem  60 mg Oral 4 times per day  . furosemide  80 mg Intravenous BID  . levalbuterol  0.63 mg Nebulization Q6H WA  . sertraline  50 mg Oral Daily  . sodium chloride flush  3 mL Intravenous Q12H  . tiotropium  18 mcg Inhalation Daily     Infusions: . heparin 1,200 Units/hr (01/21/16 1409)     PRN Medications:  HYDROcodone-acetaminophen     Assessment/Plan    1. Afib -rate controlled with dilt, in setting of systolic dysfunction will change to Toprol XL.  - CHADS2Vasc score of 2 (age, CHF), on heparin gtt currently. If no further invasive procedures  would transition to eliquis 5mg  bid.   2. COPD - management per Dr Luan Pulling   3. Acute systolic HF - echo LVEF [id number], appears to be new finding. Admitted with volume overload - on lasix IV 80mg  bid, negative 1.4 liters yesterday, negative 2 liters since admission. Stable Cr and BUN, will continue IV lasix today.  - in setting of systolic dysfunction change dilt to Topro XL. No ACE-I/ARB or aldactone at this time given Cr of 1.6.  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 Would not consider ischemc evaluation at this time given poor renal function, can readdress as outpatient.   4. Pleural effusion - s/p thoracentesis with 524mL removed, followed by Dr Luan Pulling.    Carlyle Dolly, M.D.

## 2016-01-23 LAB — BASIC METABOLIC PANEL
Anion gap: 11 (ref 5–15)
BUN: 26 mg/dL — ABNORMAL HIGH (ref 6–20)
CHLORIDE: 100 mmol/L — AB (ref 101–111)
CO2: 28 mmol/L (ref 22–32)
CREATININE: 1.53 mg/dL — AB (ref 0.61–1.24)
Calcium: 8.6 mg/dL — ABNORMAL LOW (ref 8.9–10.3)
GFR, EST AFRICAN AMERICAN: 51 mL/min — AB (ref >60)
GFR, EST NON AFRICAN AMERICAN: 44 mL/min — AB (ref >60)
Glucose, Bld: 94 mg/dL (ref 65–99)
Potassium: 3.9 mmol/L (ref 3.5–5.1)
SODIUM: 139 mmol/L (ref 135–145)

## 2016-01-23 LAB — BLOOD GAS, ARTERIAL
ACID-BASE EXCESS: 6.1 mmol/L — AB (ref 0.0–2.0)
Bicarbonate: 29.1 mEq/L — ABNORMAL HIGH (ref 20.0–24.0)
DRAWN BY: 105551
O2 CONTENT: 2 L/min
O2 SAT: 95.4 %
PCO2 ART: 49.8 mmHg — AB (ref 35.0–45.0)
pH, Arterial: 7.407 (ref 7.350–7.450)
pO2, Arterial: 85.2 mmHg (ref 80.0–100.0)

## 2016-01-23 LAB — CBC
HCT: 43.8 % (ref 39.0–52.0)
Hemoglobin: 14.2 g/dL (ref 13.0–17.0)
MCH: 29.5 pg (ref 26.0–34.0)
MCHC: 32.4 g/dL (ref 30.0–36.0)
MCV: 91.1 fL (ref 78.0–100.0)
PLATELETS: 278 10*3/uL (ref 150–400)
RBC: 4.81 MIL/uL (ref 4.22–5.81)
RDW: 15.2 % (ref 11.5–15.5)
WBC: 9.9 10*3/uL (ref 4.0–10.5)

## 2016-01-23 NOTE — Discharge Instructions (Addendum)

## 2016-01-23 NOTE — Progress Notes (Signed)
[facility] PROGRESS NOTE  Lawyer Buster [id number] DOB: 20-Nov-1945 DOA: 01/20/2016 PCP: Wende Neighbors, MD  Assessment/Plan: 1. COPD exacerbation. Dr. Luan Pulling following. No wheezing at this time. Continue neb tx. 2. Significant right pleural effusion. Etiologies include CHF versus underlying malignancy. Thoracentesis 3/23 yielded 564mL of pleural fluid which is transudative, and likely related to CHF 3. CKD stage 3. Creatinine 1.53. Avoid nephrotoxic drugs. NSAIDS have been discontinued. Continue to follow. 4. Afib with RVR. CHADs of 3-4. Rate is improved with oral diltiazem, but due to low EF, this has been changed to toprol. He is anticoagulated with eliquis. Continue current meds. ECHO as below 5. Acute systolic CHF. Pt is on lasix 80mg  IV BID. Reports improvements in overall edema. Urine output has been fair. Will continue current tx since he is feeling clinically improved.  6. Anasarca. Continue diuresis. Albumin 2.6. 24 hr urine protein has been ordered and is in process, although this will likely be low yield since he does not have any proteinuria on UA. Follow I&Os. Pt reports that his urination has become strained. Cardiology is following. ECHO as below. 7. HLD. Continue statins 8. Confusion. Possibly related to scheduled xanax. Will discontinue xanax and pain medications. He does not have any focal neurologic deficits. Check urinalysis and abg.   Code Status: Full DVT prophylaxis: eliquis Family Communication: discussed with patient  Disposition Plan: discharge once improved   Consultants:  Pulmonology  Procedures:  Thoracentesis 3/23 yielding 500 mL pleural fluid  ECHO on 3/23  - Left ventricle: The cavity size was mildly dilated. Wall  thickness was increased in a pattern of moderate LVH. Systolic  function was moderately reduced. The estimated ejection fraction  was in the range of 35% to 40%. Diffuse hypokinesis. - Mitral valve: There was mild  regurgitation. - Left atrium: The atrium was mildly dilated. - Right ventricle: The cavity size was mildly dilated. - Right atrium: The atrium was mildly dilated. - Atrial septum: No defect or patent foramen ovale was identified. - Pulmonary arteries: PA peak pressure: 47 mm Hg (S).  Antibiotics:  None  HPI/Subjective: Family feels that he is confused today and slurring his words. He feels breathing is improving  Objective: Filed Vitals:   01/22/16 2124 01/23/16 0517  BP: 127/78 134/86  Pulse: 87 105  Temp: 98.4 F (36.9 C) 98.6 F (37 C)  Resp: 16 16    Intake/Output Summary (Last 24 hours) at 01/23/16 0811 Last data filed at 01/23/16 0019  Gross per 24 hour  Intake      0 ml  Output   1675 ml  Net  -1675 ml   Filed Weights   01/20/16 1445  Weight: 82.101 kg (181 lb)    Exam:   General: NAD, looks comfortable  Cardiovascular: irregular, S1, S2   Respiratory: diminished breath sounds on right side, crackles at left base  Abdomen: soft, non tender, no distention , bowel sounds normal  Musculoskeletal: 1-2+ edema b/l  Data Reviewed:  Basic Metabolic Panel:  Recent Labs Lab 01/21/16 0537 01/22/16 0609 01/23/16 0559  NA 142 140 139  K 4.3 4.1 3.9  CL 108 105 100*  CO2 28 26 28   GLUCOSE 98 105* 94  BUN 27* 30* 26*  CREATININE 1.61* 1.62* 1.53*  CALCIUM 8.2* 8.4* 8.6*   Liver Function Tests:  Recent Labs Lab 01/21/16 1052  AST 21  ALT 22  ALKPHOS 126  BILITOT 1.2  PROT 4.8*  ALBUMIN 2.6*     Recent Labs Lab  01/20/16 1654 01/21/16 0537 01/22/16 0609 01/23/16 0559  WBC 9.9 8.4 9.4 9.9  HGB 15.0 13.8 13.7 14.2  HCT 46.3 43.6 43.1 43.8  MCV 91.5 92.8 91.7 91.1  PLT 302 260 251 278     Recent Results (from the past 240 hour(s))  Culture, body fluid-bottle     Status: None (Preliminary result)   Collection Time: 01/21/16 12:45 PM  Result Value Ref Range Status   Specimen Description FLUID RIGHT PLEURAL COLLECTED BY [facility]  Final   Special Requests BOTTLES DRAWN AEROBIC AND ANAEROBIC 10CC EACH  Final   Culture NO GROWTH 2 DAYS  Final   Report Status PENDING  Incomplete  Gram stain     Status: None   Collection Time: 01/21/16 12:45 PM  Result Value Ref Range Status   Specimen Description FLUID RIGHT PLEURAL COLLECTED BY [facility]  Final   Special Requests NONE  Final   Gram Stain   Final    CYTOSPIN SMEAR WBC PRESENT, PREDOMINANTLY MONONUCLEAR NO ORGANISMS SEEN Performed at [facility]    Report Status 01/21/2016 FINAL  Final     Studies: Dg Chest 1 View  01/21/2016  CLINICAL DATA:  Post thoracentesis EXAM: CHEST 1 VIEW COMPARISON:  01/20/2016 FINDINGS: Small right pleural effusion, decreased status post thoracentesis. Associated right lower lobe opacity, likely atelectasis. Trace left pleural effusion. No pneumothorax. Cardiomegaly. IMPRESSION: Small right pleural effusion, decreased. No pneumothorax status post thoracentesis. Electronically Signed   By: Julian Hy M.D.   On: 01/21/2016 13:34   US Thoracentesis Asp Pleural Space W/img Guide  01/21/2016  INDICATION: Right pleural effusion EXAM: ULTRASOUND GUIDED RIGHT THORACENTESIS MEDICATIONS: None. COMPLICATIONS: None immediate. PROCEDURE: An ultrasound guided thoracentesis was thoroughly discussed with the patient and questions answered. The benefits, risks, alternatives and complications were also discussed. The patient understands and wishes to proceed with the procedure. Written consent was obtained. Ultrasound was performed to localize and mark an adequate pocket of fluid in the right chest. The area was then prepped and draped in the normal sterile fashion. 1% Lidocaine was used for local anesthesia. Under ultrasound guidance a 19 gauge, 7-cm, Yueh catheter was introduced. Thoracentesis was performed. The catheter was removed and a dressing applied. FINDINGS: A total of approximately 500 mL of yellow colored fluid was removed.  Samples were sent to the laboratory as requested by the clinical team. IMPRESSION: Successful ultrasound guided right thoracentesis yielding 500 mL of pleural fluid. Electronically Signed   By: Julian Hy M.D.   On: 01/21/2016 13:32    Scheduled Meds: . ALPRAZolam  0.5 mg Oral BID  . antiseptic oral rinse  7 mL Mouth Rinse BID  . apixaban  5 mg Oral BID  . arformoterol  15 mcg Nebulization BID   And  . budesonide (PULMICORT) nebulizer solution  0.5 mg Nebulization BID  . atorvastatin  20 mg Oral q1800  . furosemide  80 mg Intravenous BID  . guaiFENesin  1,200 mg Oral BID  . levalbuterol  0.63 mg Nebulization Q6H WA  . metoprolol succinate  37.5 mg Oral BID  . sertraline  50 mg Oral Daily  . sodium chloride flush  3 mL Intravenous Q12H  . tiotropium  18 mcg Inhalation Daily   Continuous Infusions:    Active Problems:   Anticoagulated   COPD (chronic obstructive pulmonary disease) (HCC)   Anasarca   CKD (chronic kidney disease) stage 3, GFR 30-59 ml/min   Hypoalbuminemia   Atrial fibrillation with rapid  ventricular response (HCC)   Acute systolic CHF (congestive heart failure) (HCC)   Pleural effusion on right    Time spent: 25 minutes    Kathie Dike, MD. [facility] Pager [phone]. If 7PM-7AM, please contact night-coverage at [email or web address], password [facility] 01/23/2016, 8:11 AM  LOS: 3 days

## 2016-01-23 NOTE — Progress Notes (Signed)
Patient asleep pulse is irregular as well on monitor. PVC's will monitor.

## 2016-01-23 NOTE — Progress Notes (Signed)
Subjective: He says he feels better. He has no new complaints.  Objective: Vital signs in last 24 hours: Temp:  [98.2 F (36.8 C)-98.6 F (37 C)] 98.6 F (37 C) (03/25 0517) Pulse Rate:  [87-105] 105 (03/25 0517) Resp:  [16] 16 (03/25 0517) BP: (127-134)/(78-89) 134/86 mmHg (03/25 0517) SpO2:  [82 %-99 %] 98 % (03/25 1027) Weight:  [76.295 kg (168 lb 3.2 oz)] 76.295 kg (168 lb 3.2 oz) (03/25 1002) Weight change:  Last BM Date: 01/23/16  Intake/Output from previous day: 03/24 0701 - 03/25 0700 In: -  Out: 1925 [Urine:1925]  PHYSICAL EXAM General appearance: alert, cooperative and Very hard of hearing Resp: rhonchi bilaterally Cardio: irregularly irregular rhythm GI: soft, non-tender; bowel sounds normal; no masses,  no organomegaly Extremities: extremities normal, atraumatic, no cyanosis or edema  Lab Results:  Results for orders placed or performed during the hospital encounter of 01/20/16 (from the past 48 hour(s))  Lactate dehydrogenase (CSF, pleural or peritoneal fluid)     Status: Abnormal   Collection Time: 01/21/16 12:45 PM  Result Value Ref Range   LD, Fluid 43 (H) 3 - 23 U/L    Comment: (NOTE) Results should be evaluated in conjunction with serum values    Fluid Type-FLDH FLUID     Comment: RIGHT PLEURAL COLLECTED BY DOCTOR CORRECTED ON 03/23 AT 1423: PREVIOUSLY REPORTED AS Pleural R   Protein, pleural or peritoneal fluid     Status: None   Collection Time: 01/21/16 12:45 PM  Result Value Ref Range   Total protein, fluid <3.0 g/dL    Comment: (NOTE) No normal range established for this test Results should be evaluated in conjunction with serum values    Fluid Type-FTP FLUID     Comment: RIGHT PLEURAL COLLECTED BY DOCTOR CORRECTED ON 03/23 AT 1424: PREVIOUSLY REPORTED AS Pleural R   Body fluid cell count with differential     Status: None   Collection Time: 01/21/16 12:45 PM  Result Value Ref Range   Fluid Type-FCT FLUID     Comment:  RIGHT PLEURAL COLLECTED BY DOCTOR CORRECTED ON 03/23 AT 1423: PREVIOUSLY REPORTED AS Pleural R    Color, Fluid YELLOW YELLOW   Appearance, Fluid CLEAR CLEAR   WBC, Fluid 129 0 - 1000 cu mm   Neutrophil Count, Fluid 2 0 - 25 %   Lymphs, Fluid 47 %   Monocyte-Macrophage-Serous Fluid 51 50 - 90 %   Eos, Fluid 0 %   Other Cells, Fluid FEW %    Comment: OTHER CELLS IDENTIFIED AS MESOTHELIAL CELLS CYTOLOGY TO FOLLOW.   Glucose, pleural or peritoneal fluid     Status: None   Collection Time: 01/21/16 12:45 PM  Result Value Ref Range   Glucose, Fluid 112 mg/dL    Comment: (NOTE) No normal range established for this test Results should be evaluated in conjunction with serum values    Fluid Type-FGLU FLUID     Comment: RIGHT ASCITIC COLLECTED BY DOCTOR CORRECTED ON 03/23 AT 1428: PREVIOUSLY REPORTED AS Pleural R   Culture, body fluid-bottle     Status: None (Preliminary result)   Collection Time: 01/21/16 12:45 PM  Result Value Ref Range   Specimen Description FLUID RIGHT PLEURAL COLLECTED BY Neomia Dear    Special Requests BOTTLES DRAWN AEROBIC AND ANAEROBIC 10CC EACH    Culture NO GROWTH 2 DAYS    Report Status PENDING   Gram stain     Status: None   Collection Time: 01/21/16 12:45 PM  Result  Value Ref Range   Specimen Description FLUID RIGHT PLEURAL COLLECTED BY [facility]    Special Requests NONE    Gram Stain      CYTOSPIN SMEAR WBC PRESENT, PREDOMINANTLY MONONUCLEAR NO ORGANISMS SEEN Performed at [facility] [place]    Report Status 01/21/2016 FINAL   Urinalysis, Routine w reflex microscopic (not at [facility])     Status: Abnormal   Collection Time: 01/21/16  2:25 PM  Result Value Ref Range   Color, Urine YELLOW YELLOW   APPearance CLEAR CLEAR   Specific Gravity, Urine 1.020 1.005 - 1.030   pH 5.0 5.0 - 8.0   Glucose, UA NEGATIVE NEGATIVE mg/dL   Hgb urine dipstick SMALL (A) NEGATIVE   Bilirubin Urine NEGATIVE NEGATIVE   Ketones, ur NEGATIVE NEGATIVE mg/dL    Protein, ur NEGATIVE NEGATIVE mg/dL   Nitrite NEGATIVE NEGATIVE   Leukocytes, UA NEGATIVE NEGATIVE  Urine microscopic-add on     Status: Abnormal   Collection Time: 01/21/16  2:25 PM  Result Value Ref Range   Squamous Epithelial / LPF 0-5 (A) NONE SEEN   WBC, UA 0-5 0 - 5 WBC/hpf   RBC / HPF 0-5 0 - 5 RBC/hpf   Bacteria, UA RARE (A) NONE SEEN  Basic metabolic panel     Status: Abnormal   Collection Time: 01/22/16  6:09 AM  Result Value Ref Range   Sodium 140 135 - 145 mmol/L   Potassium 4.1 3.5 - 5.1 mmol/L   Chloride 105 101 - 111 mmol/L   CO2 26 22 - 32 mmol/L   Glucose, Bld 105 (H) 65 - 99 mg/dL   BUN 30 (H) 6 - 20 mg/dL   Creatinine, Ser 1.62 (H) 0.61 - 1.24 mg/dL   Calcium 8.4 (L) 8.9 - 10.3 mg/dL   GFR calc non Af Amer 41 (L) >60 mL/min   GFR calc Af Amer 48 (L) >60 mL/min    Comment: (NOTE) The eGFR has been calculated using the CKD EPI equation. This calculation has not been validated in all clinical situations. eGFR's persistently <60 mL/min signify possible Chronic Kidney Disease.    Anion gap 9 5 - 15  CBC     Status: None   Collection Time: 01/22/16  6:09 AM  Result Value Ref Range   WBC 9.4 4.0 - 10.5 K/uL   RBC 4.70 4.22 - 5.81 MIL/uL   Hemoglobin 13.7 13.0 - 17.0 g/dL   HCT 43.1 39.0 - 52.0 %   MCV 91.7 78.0 - 100.0 fL   MCH 29.1 26.0 - 34.0 pg   MCHC 31.8 30.0 - 36.0 g/dL   RDW 15.1 11.5 - 15.5 %   Platelets 251 150 - 400 K/uL  Heparin level (unfractionated)     Status: None   Collection Time: 01/22/16  6:09 AM  Result Value Ref Range   Heparin Unfractionated 0.39 0.30 - 0.70 IU/mL    Comment:        IF HEPARIN RESULTS ARE BELOW EXPECTED VALUES, AND PATIENT DOSAGE HAS BEEN CONFIRMED, SUGGEST FOLLOW UP TESTING OF ANTITHROMBIN III LEVELS.   Basic metabolic panel     Status: Abnormal   Collection Time: 01/23/16  5:59 AM  Result Value Ref Range   Sodium 139 135 - 145 mmol/L   Potassium 3.9 3.5 - 5.1 mmol/L   Chloride 100 (L) 101 - 111 mmol/L    CO2 28 22 - 32 mmol/L   Glucose, Bld 94 65 - 99 mg/dL   BUN 26 (H)  6 - 20 mg/dL   Creatinine, Ser 1.53 (H) 0.61 - 1.24 mg/dL   Calcium 8.6 (L) 8.9 - 10.3 mg/dL   GFR calc non Af Amer 44 (L) >60 mL/min   GFR calc Af Amer 51 (L) >60 mL/min    Comment: (NOTE) The eGFR has been calculated using the CKD EPI equation. This calculation has not been validated in all clinical situations. eGFR's persistently <60 mL/min signify possible Chronic Kidney Disease.    Anion gap 11 5 - 15  CBC     Status: None   Collection Time: 01/23/16  5:59 AM  Result Value Ref Range   WBC 9.9 4.0 - 10.5 K/uL   RBC 4.81 4.22 - 5.81 MIL/uL   Hemoglobin 14.2 13.0 - 17.0 g/dL   HCT 43.8 39.0 - 52.0 %   MCV 91.1 78.0 - 100.0 fL   MCH 29.5 26.0 - 34.0 pg   MCHC 32.4 30.0 - 36.0 g/dL   RDW 15.2 11.5 - 15.5 %   Platelets 278 150 - 400 K/uL    ABGS No results for input(s): PHART, PO2ART, TCO2, HCO3 in the last 72 hours.  Invalid input(s): PCO2 CULTURES Recent Results (from the past 240 hour(s))  Culture, body fluid-bottle     Status: None (Preliminary result)   Collection Time: 01/21/16 12:45 PM  Result Value Ref Range Status   Specimen Description FLUID RIGHT PLEURAL COLLECTED BY Neomia Dear  Final   Special Requests BOTTLES DRAWN AEROBIC AND ANAEROBIC 10CC EACH  Final   Culture NO GROWTH 2 DAYS  Final   Report Status PENDING  Incomplete  Gram stain     Status: None   Collection Time: 01/21/16 12:45 PM  Result Value Ref Range Status   Specimen Description FLUID RIGHT PLEURAL COLLECTED BY [facility]  Final   Special Requests NONE  Final   Gram Stain   Final    CYTOSPIN SMEAR WBC PRESENT, PREDOMINANTLY MONONUCLEAR NO ORGANISMS SEEN Performed at [facility]    Report Status 01/21/2016 FINAL  Final   Studies/Results: Dg Chest 1 View  01/21/2016  CLINICAL DATA:  Post thoracentesis EXAM: CHEST 1 VIEW COMPARISON:  01/20/2016 FINDINGS: Small right pleural effusion, decreased status post  thoracentesis. Associated right lower lobe opacity, likely atelectasis. Trace left pleural effusion. No pneumothorax. Cardiomegaly. IMPRESSION: Small right pleural effusion, decreased. No pneumothorax status post thoracentesis. Electronically Signed   By: Julian Hy M.D.   On: 01/21/2016 13:34   US Thoracentesis Asp Pleural Space W/img Guide  01/21/2016  INDICATION: Right pleural effusion EXAM: ULTRASOUND GUIDED RIGHT THORACENTESIS MEDICATIONS: None. COMPLICATIONS: None immediate. PROCEDURE: An ultrasound guided thoracentesis was thoroughly discussed with the patient and questions answered. The benefits, risks, alternatives and complications were also discussed. The patient understands and wishes to proceed with the procedure. Written consent was obtained. Ultrasound was performed to localize and mark an adequate pocket of fluid in the right chest. The area was then prepped and draped in the normal sterile fashion. 1% Lidocaine was used for local anesthesia. Under ultrasound guidance a 19 gauge, 7-cm, Yueh catheter was introduced. Thoracentesis was performed. The catheter was removed and a dressing applied. FINDINGS: A total of approximately 500 mL of yellow colored fluid was removed. Samples were sent to the laboratory as requested by the clinical team. IMPRESSION: Successful ultrasound guided right thoracentesis yielding 500 mL of pleural fluid. Electronically Signed   By: Julian Hy M.D.   On: 01/21/2016 13:32    Medications:  Prior to  Admission:  Prescriptions prior to admission  Medication Sig Dispense Refill Last Dose  . acetaminophen (TYLENOL) 325 MG tablet Take 2 tablets (650 mg total) by mouth every 6 (six) hours as needed (mild pain).   unknown  . alfuzosin (UROXATRAL) 10 MG 24 hr tablet Take 10 mg by mouth daily with breakfast.    unknown  . ALPRAZolam (XANAX) 0.5 MG tablet Take 0.5 mg by mouth 2 (two) times daily.   01/19/2016 at Unknown time  . atorvastatin (LIPITOR) 20 MG  tablet Take 20 mg by mouth daily.   01/19/2016 at Unknown time  . BREO ELLIPTA 200-25 MCG/INH AEPB Inhale 1 puff into the lungs daily.    01/20/2016 at Unknown time  . furosemide (LASIX) 40 MG tablet Take 40 mg by mouth daily. Reported on 01/20/2016   unknown  . HYDROcodone-acetaminophen (NORCO/VICODIN) 5-325 MG per tablet Take 1 tablet every 6 hours or take 2 tablets every 6 hours as needed for severe pain 240 tablet 0 Past Week at Unknown time  . naproxen (NAPROSYN) 500 MG tablet Take 500 mg by mouth 2 (two) times daily with a meal.    unknown  . sertraline (ZOLOFT) 50 MG tablet Take 50 mg by mouth daily.   unknown  . SPIRIVA HANDIHALER 18 MCG inhalation capsule    01/20/2016 at Unknown time   Scheduled: . ALPRAZolam  0.5 mg Oral BID  . antiseptic oral rinse  7 mL Mouth Rinse BID  . apixaban  5 mg Oral BID  . arformoterol  15 mcg Nebulization BID   And  . budesonide (PULMICORT) nebulizer solution  0.5 mg Nebulization BID  . atorvastatin  20 mg Oral q1800  . furosemide  80 mg Intravenous BID  . guaiFENesin  1,200 mg Oral BID  . levalbuterol  0.63 mg Nebulization Q6H WA  . metoprolol succinate  37.5 mg Oral BID  . sertraline  50 mg Oral Daily  . sodium chloride flush  3 mL Intravenous Q12H  . tiotropium  18 mcg Inhalation Daily   Continuous:  FMB:[id number], levalbuterol  Assesment: He was admitted with COPD exacerbation atrial fib with rapid ventricular response acute systolic heart failure and anasarca. He seems to be slowly improving. Active Problems:   Anticoagulated   COPD (chronic obstructive pulmonary disease) (HCC)   Anasarca   CKD (chronic kidney disease) stage 3, GFR 30-59 ml/min   Hypoalbuminemia   Atrial fibrillation with rapid ventricular response (HCC)   Acute systolic CHF (congestive heart failure) (HCC)   Pleural effusion on right    Plan: Continue current treatments    LOS: 3 days   Ricardo Garrett L 01/23/2016, 11:20 AM

## 2016-01-24 LAB — URINALYSIS, ROUTINE W REFLEX MICROSCOPIC
Bilirubin Urine: NEGATIVE
GLUCOSE, UA: NEGATIVE mg/dL
Ketones, ur: NEGATIVE mg/dL
Leukocytes, UA: NEGATIVE
Nitrite: NEGATIVE
Protein, ur: >300 mg/dL — AB
SPECIFIC GRAVITY, URINE: 1.015 (ref 1.005–1.030)
pH: 6 (ref 5.0–8.0)

## 2016-01-24 LAB — URINE MICROSCOPIC-ADD ON
SQUAMOUS EPITHELIAL / LPF: NONE SEEN
WBC, UA: NONE SEEN WBC/hpf (ref 0–5)

## 2016-01-24 LAB — CBC
HCT: 46.5 % (ref 39.0–52.0)
HEMOGLOBIN: 14.8 g/dL (ref 13.0–17.0)
MCH: 29 pg (ref 26.0–34.0)
MCHC: 31.8 g/dL (ref 30.0–36.0)
MCV: 91.2 fL (ref 78.0–100.0)
Platelets: 300 10*3/uL (ref 150–400)
RBC: 5.1 MIL/uL (ref 4.22–5.81)
RDW: 15 % (ref 11.5–15.5)
WBC: 10.5 10*3/uL (ref 4.0–10.5)

## 2016-01-24 LAB — BASIC METABOLIC PANEL
ANION GAP: 11 (ref 5–15)
BUN: 25 mg/dL — ABNORMAL HIGH (ref 6–20)
CHLORIDE: 99 mmol/L — AB (ref 101–111)
CO2: 30 mmol/L (ref 22–32)
Calcium: 8.6 mg/dL — ABNORMAL LOW (ref 8.9–10.3)
Creatinine, Ser: 1.35 mg/dL — ABNORMAL HIGH (ref 0.61–1.24)
GFR calc non Af Amer: 51 mL/min — ABNORMAL LOW (ref >60)
GFR, EST AFRICAN AMERICAN: 59 mL/min — AB (ref >60)
Glucose, Bld: 91 mg/dL (ref 65–99)
Potassium: 3.7 mmol/L (ref 3.5–5.1)
Sodium: 140 mmol/L (ref 135–145)

## 2016-01-24 MED ORDER — TAMSULOSIN HCL 0.4 MG PO CAPS
0.4000 mg | ORAL_CAPSULE | Freq: Every day | ORAL | Status: DC
  Administered 2016-01-24 – 2016-01-25 (×2): 0.4 mg via ORAL
  Filled 2016-01-24 (×2): qty 1

## 2016-01-24 MED ORDER — DIGOXIN 125 MCG PO TABS
0.1250 mg | ORAL_TABLET | Freq: Every day | ORAL | Status: DC
  Administered 2016-01-25 – 2016-01-26 (×2): 0.125 mg via ORAL
  Filled 2016-01-24 (×3): qty 1

## 2016-01-24 MED ORDER — DIGOXIN 0.25 MG/ML IJ SOLN
0.2500 mg | Freq: Once | INTRAMUSCULAR | Status: AC
  Administered 2016-01-24: 0.25 mg via INTRAVENOUS
  Filled 2016-01-24: qty 2

## 2016-01-24 MED ORDER — DIGOXIN 0.25 MG/ML IJ SOLN
0.5000 mg | INTRAMUSCULAR | Status: AC
  Administered 2016-01-24: 0.5 mg via INTRAVENOUS
  Filled 2016-01-24: qty 2

## 2016-01-24 NOTE — Progress Notes (Signed)
Patient voided 100 cc.  Post void bladder scan was greater than 200 cc.

## 2016-01-24 NOTE — Progress Notes (Signed)
[facility] PROGRESS NOTE  Ricardo Garrett [id number] DOB: 03-16-1946 DOA: 01/20/2016 PCP: Wende Neighbors, MD  Assessment/Plan: 1. COPD exacerbation. Dr. Luan Pulling following. No wheezing at this time. Continue neb tx. 2. Significant right pleural effusion. Etiologies include CHF versus underlying malignancy. Thoracentesis 3/23 yielded 535mL of pleural fluid which is transudative, and likely related to CHF. 3. CKD stage 3. Creatinine 1.35. Avoid nephrotoxic drugs. NSAIDS have been discontinued. Creatinine has been improving with diuresis. Continue to follow. 4. Afib with RVR. CHADs of 3-4. Rate initially improved with oral diltiazem, but due to low EF, this has been changed to toprol. He is anticoagulated with eliquis. Continue current meds. ECHO as below. He was tachycardic today. Will start on digoxin. 5. Acute systolic CHF. Pt is on lasix 80mg  IV BID. Reports improvements in overall edema. Urine output has been fair. Will continue current tx since he is feeling clinically improved.  6. Anasarca. Continue diuresis and monitor in the setting of CKD. 24 hr urine protein has been ordered and is in process. Follow I&Os. Pt's urination has become strained, but foley cath has been placed. Cardiology is following. ECHO as below. 7. HLD. Continue statins 8. Urinary retention. Relieved with foley cath. UA revealed only rare bacteria and no signs of infection. Will add Flomax. 9. Confusion. Possibly related to scheduled xanax. Xanax and pain medications have been discontinued and appears to be improving. He does not have any focal neurologic deficits. UA showed only rare bacteria. ABG showed PCO2 49.8. Bicarb 29.1. Acid base excess 6.1.   Code Status: Full DVT prophylaxis: eliquis Family Communication: discussed with patient and family present at bedside. Disposition Plan: discharge once improved   Consultants:  Pulmonology  Procedures:  Thoracentesis 3/23 yielding 500 mL pleural fluid  ECHO  on 3/23  - Left ventricle: The cavity size was mildly dilated. Wall  thickness was increased in a pattern of moderate LVH. Systolic  function was moderately reduced. The estimated ejection fraction  was in the range of 35% to 40%. Diffuse hypokinesis. - Mitral valve: There was mild regurgitation. - Left atrium: The atrium was mildly dilated. - Right ventricle: The cavity size was mildly dilated. - Right atrium: The atrium was mildly dilated. - Atrial septum: No defect or patent foramen ovale was identified. - Pulmonary arteries: PA peak pressure: 47 mm Hg (S).  Antibiotics:  None  HPI/Subjective: Cannot properly describe how he feels, is unable to properly describe how he feels with catheter. Reports that his breathing is good. Reports that his feel are doing well. He reports that he he has not been eating very much, but does not eat much normally.  Per family, he did not appear good yesterday, but appears better and is talking more today. He has not been eating. Family believe that his breathing has improved today. He did have a lot of diarrhea yesterday, but he has been in bed all today.   Objective: Filed Vitals:   01/23/16 2022 01/24/16 0648  BP: 101/78 124/72  Pulse: 99 75  Temp: 98.6 F (37 C) 98.7 F (37.1 C)  Resp: 18 18    Intake/Output Summary (Last 24 hours) at 01/24/16 0748 Last data filed at 01/24/16 0233  Gross per 24 hour  Intake    720 ml  Output   1100 ml  Net   -380 ml   Filed Weights   01/20/16 1445 01/23/16 1002 01/24/16 0648  Weight: 82.101 kg (181 lb) 76.295 kg (168 lb 3.2 oz) 73.7 kg (162 lb  7.7 oz)    Exam:  General: NAD, looks comfortable  Cardiovascular: irregular Respiratory: diminished breath sounds at right base Abdomen: soft, non tender, no distention , bowel sounds normal Musculoskeletal: 1+ edema b/l  Data Reviewed:  Basic Metabolic Panel:  Recent Labs Lab 01/21/16 0537 01/22/16 0609 01/23/16 0559  NA 142 140 139  K  4.3 4.1 3.9  CL 108 105 100*  CO2 28 26 28   GLUCOSE 98 105* 94  BUN 27* 30* 26*  CREATININE 1.61* 1.62* 1.53*  CALCIUM 8.2* 8.4* 8.6*   Liver Function Tests:  Recent Labs Lab 01/21/16 1052  AST 21  ALT 22  ALKPHOS 126  BILITOT 1.2  PROT 4.8*  ALBUMIN 2.6*     Recent Labs Lab 01/20/16 1654 01/21/16 0537 01/22/16 0609 01/23/16 0559  WBC 9.9 8.4 9.4 9.9  HGB 15.0 13.8 13.7 14.2  HCT 46.3 43.6 43.1 43.8  MCV 91.5 92.8 91.7 91.1  PLT 302 260 251 278     Recent Results (from the past 240 hour(s))  Culture, body fluid-bottle     Status: None (Preliminary result)   Collection Time: 01/21/16 12:45 PM  Result Value Ref Range Status   Specimen Description FLUID RIGHT PLEURAL COLLECTED BY Neomia Dear  Final   Special Requests BOTTLES DRAWN AEROBIC AND ANAEROBIC 10CC EACH  Final   Culture NO GROWTH 2 DAYS  Final   Report Status PENDING  Incomplete  Gram stain     Status: None   Collection Time: 01/21/16 12:45 PM  Result Value Ref Range Status   Specimen Description FLUID RIGHT PLEURAL COLLECTED BY Neomia Dear  Final   Special Requests NONE  Final   Gram Stain   Final    CYTOSPIN SMEAR WBC PRESENT, PREDOMINANTLY MONONUCLEAR NO ORGANISMS SEEN Performed at [facility]    Report Status 01/21/2016 FINAL  Final     Studies: No results found.  Scheduled Meds: . antiseptic oral rinse  7 mL Mouth Rinse BID  . apixaban  5 mg Oral BID  . arformoterol  15 mcg Nebulization BID   And  . budesonide (PULMICORT) nebulizer solution  0.5 mg Nebulization BID  . atorvastatin  20 mg Oral q1800  . furosemide  80 mg Intravenous BID  . guaiFENesin  1,200 mg Oral BID  . levalbuterol  0.63 mg Nebulization Q6H WA  . metoprolol succinate  37.5 mg Oral BID  . sertraline  50 mg Oral Daily  . sodium chloride flush  3 mL Intravenous Q12H  . tiotropium  18 mcg Inhalation Daily   Continuous Infusions:    Active Problems:   Anticoagulated   COPD (chronic obstructive  pulmonary disease) (HCC)   Anasarca   CKD (chronic kidney disease) stage 3, GFR 30-59 ml/min   Hypoalbuminemia   Atrial fibrillation with rapid ventricular response (HCC)   Acute systolic CHF (congestive heart failure) (HCC)   Pleural effusion on right    Time spent: 25 minutes    Kathie Dike, MD. [facility] Pager [phone]. If 7PM-7AM, please contact night-coverage at [email or web address], password [facility] 01/24/2016, 7:48 AM  LOS: 4 days    By signing my name below, I, Delene Ruffini, attest that this documentation has been prepared under the direction and in the presence of Kathie Dike, MD. Electronically Signed: Delene Ruffini 01/24/2016 2:27pm  I, Dr. Kathie Dike, personally performed the services described in this documentaiton. All medical record entries made by the scribe were at my direction and in my presence. I have  reviewed the chart and agree that the record reflects my personal performance and is accurate and complete  Kathie Dike, MD, 01/24/2016 2:46 PM

## 2016-01-24 NOTE — Progress Notes (Signed)
Subjective: He feels better. He's improving as far as his COPD is concerned. He still has some edema.  Objective: Vital signs in last 24 hours: Temp:  [97.9 F (36.6 C)-98.7 F (37.1 C)] 98.4 F (36.9 C) (03/26 1014) Pulse Rate:  [75-122] 122 (03/26 1014) Resp:  [16-20] 20 (03/26 1014) BP: (101-141)/(72-114) 108/86 mmHg (03/26 1014) SpO2:  [93 %-98 %] 95 % (03/26 1058) Weight:  [73.7 kg (162 lb 7.7 oz)] 73.7 kg (162 lb 7.7 oz) (03/26 8315) Weight change:  Last BM Date: 01/23/16  Intake/Output from previous day: 03/25 0701 - 03/26 0700 In: 720 [P.O.:720] Out: 1100 [Urine:1100]  PHYSICAL EXAM General appearance: alert, cooperative and mild distress Resp: clear to auscultation bilaterally Cardio: irregularly irregular rhythm GI: soft, non-tender; bowel sounds normal; no masses,  no organomegaly Extremities: venous stasis dermatitis noted  Lab Results:  Results for orders placed or performed during the hospital encounter of 01/20/16 (from the past 48 hour(s))  Basic metabolic panel     Status: Abnormal   Collection Time: 01/23/16  5:59 AM  Result Value Ref Range   Sodium 139 135 - 145 mmol/L   Potassium 3.9 3.5 - 5.1 mmol/L   Chloride 100 (L) 101 - 111 mmol/L   CO2 28 22 - 32 mmol/L   Glucose, Bld 94 65 - 99 mg/dL   BUN 26 (H) 6 - 20 mg/dL   Creatinine, Ser 1.53 (H) 0.61 - 1.24 mg/dL   Calcium 8.6 (L) 8.9 - 10.3 mg/dL   GFR calc non Af Amer 44 (L) >60 mL/min   GFR calc Af Amer 51 (L) >60 mL/min    Comment: (NOTE) The eGFR has been calculated using the CKD EPI equation. This calculation has not been validated in all clinical situations. eGFR's persistently <60 mL/min signify possible Chronic Kidney Disease.    Anion gap 11 5 - 15  CBC     Status: None   Collection Time: 01/23/16  5:59 AM  Result Value Ref Range   WBC 9.9 4.0 - 10.5 K/uL   RBC 4.81 4.22 - 5.81 MIL/uL   Hemoglobin 14.2 13.0 - 17.0 g/dL   HCT 43.8 39.0 - 52.0 %   MCV 91.1 78.0 - 100.0 fL   MCH 29.5  26.0 - 34.0 pg   MCHC 32.4 30.0 - 36.0 g/dL   RDW 15.2 11.5 - 15.5 %   Platelets 278 150 - 400 K/uL  Blood gas, arterial     Status: Abnormal   Collection Time: 01/23/16  7:10 PM  Result Value Ref Range   O2 Content 2.0 L/min   Delivery systems NASAL CANNULA    pH, Arterial 7.407 7.350 - 7.450   pCO2 arterial 49.8 (H) 35.0 - 45.0 mmHg   pO2, Arterial 85.2 80.0 - 100.0 mmHg   Bicarbonate 29.1 (H) 20.0 - 24.0 mEq/L   Acid-Base Excess 6.1 (H) 0.0 - 2.0 mmol/L   O2 Saturation 95.4 %   Collection site BRACHIAL ARTERY    Drawn by [id number]    Sample type ARTERIAL    Allens test (pass/fail) NOT INDICATED (A) PASS  Basic metabolic panel     Status: Abnormal   Collection Time: 01/24/16  5:59 AM  Result Value Ref Range   Sodium 140 135 - 145 mmol/L   Potassium 3.7 3.5 - 5.1 mmol/L   Chloride 99 (L) 101 - 111 mmol/L   CO2 30 22 - 32 mmol/L   Glucose, Bld 91 65 - 99 mg/dL   BUN 25 (  H) 6 - 20 mg/dL   Creatinine, Ser 1.35 (H) 0.61 - 1.24 mg/dL   Calcium 8.6 (L) 8.9 - 10.3 mg/dL   GFR calc non Af Amer 51 (L) >60 mL/min   GFR calc Af Amer 59 (L) >60 mL/min    Comment: (NOTE) The eGFR has been calculated using the CKD EPI equation. This calculation has not been validated in all clinical situations. eGFR's persistently <60 mL/min signify possible Chronic Kidney Disease.    Anion gap 11 5 - 15  CBC     Status: None   Collection Time: 01/24/16  5:59 AM  Result Value Ref Range   WBC 10.5 4.0 - 10.5 K/uL   RBC 5.10 4.22 - 5.81 MIL/uL   Hemoglobin 14.8 13.0 - 17.0 g/dL   HCT 46.5 39.0 - 52.0 %   MCV 91.2 78.0 - 100.0 fL   MCH 29.0 26.0 - 34.0 pg   MCHC 31.8 30.0 - 36.0 g/dL   RDW 15.0 11.5 - 15.5 %   Platelets 300 150 - 400 K/uL    ABGS  Recent Labs  01/23/16 1910  PHART 7.407  PO2ART 85.2  HCO3 29.1*   CULTURES Recent Results (from the past 240 hour(s))  Culture, body fluid-bottle     Status: None (Preliminary result)   Collection Time: 01/21/16 12:45 PM  Result Value Ref  Range Status   Specimen Description FLUID RIGHT PLEURAL COLLECTED BY Neomia Dear  Final   Special Requests BOTTLES DRAWN AEROBIC AND ANAEROBIC 10CC EACH  Final   Culture NO GROWTH 2 DAYS  Final   Report Status PENDING  Incomplete  Gram stain     Status: None   Collection Time: 01/21/16 12:45 PM  Result Value Ref Range Status   Specimen Description FLUID RIGHT PLEURAL COLLECTED BY [facility]  Final   Special Requests NONE  Final   Gram Stain   Final    CYTOSPIN SMEAR WBC PRESENT, PREDOMINANTLY MONONUCLEAR NO ORGANISMS SEEN Performed at [facility]    Report Status 01/21/2016 FINAL  Final   Studies/Results: No results found.  Medications:  Prior to Admission:  Prescriptions prior to admission  Medication Sig Dispense Refill Last Dose  . acetaminophen (TYLENOL) 325 MG tablet Take 2 tablets (650 mg total) by mouth every 6 (six) hours as needed (mild pain).   unknown  . alfuzosin (UROXATRAL) 10 MG 24 hr tablet Take 10 mg by mouth daily with breakfast.    unknown  . ALPRAZolam (XANAX) 0.5 MG tablet Take 0.5 mg by mouth 2 (two) times daily.   01/19/2016 at Unknown time  . atorvastatin (LIPITOR) 20 MG tablet Take 20 mg by mouth daily.   01/19/2016 at Unknown time  . BREO ELLIPTA 200-25 MCG/INH AEPB Inhale 1 puff into the lungs daily.    01/20/2016 at Unknown time  . furosemide (LASIX) 40 MG tablet Take 40 mg by mouth daily. Reported on 01/20/2016   unknown  . HYDROcodone-acetaminophen (NORCO/VICODIN) 5-325 MG per tablet Take 1 tablet every 6 hours or take 2 tablets every 6 hours as needed for severe pain 240 tablet 0 Past Week at Unknown time  . naproxen (NAPROSYN) 500 MG tablet Take 500 mg by mouth 2 (two) times daily with a meal.    unknown  . sertraline (ZOLOFT) 50 MG tablet Take 50 mg by mouth daily.   unknown  . SPIRIVA HANDIHALER 18 MCG inhalation capsule    01/20/2016 at Unknown time   Scheduled: . antiseptic oral rinse  7 mL Mouth Rinse BID  . apixaban  5 mg Oral BID   . arformoterol  15 mcg Nebulization BID   And  . budesonide (PULMICORT) nebulizer solution  0.5 mg Nebulization BID  . atorvastatin  20 mg Oral q1800  . furosemide  80 mg Intravenous BID  . guaiFENesin  1,200 mg Oral BID  . levalbuterol  0.63 mg Nebulization Q6H WA  . metoprolol succinate  37.5 mg Oral BID  . sertraline  50 mg Oral Daily  . sodium chloride flush  3 mL Intravenous Q12H  . tiotropium  18 mcg Inhalation Daily   Continuous:  FEX:[id number]  Assesment: He still has edema. His COPD is improving. His atrial fibrillation is improving. Active Problems:   Anticoagulated   COPD (chronic obstructive pulmonary disease) (HCC)   Anasarca   CKD (chronic kidney disease) stage 3, GFR 30-59 ml/min   Hypoalbuminemia   Atrial fibrillation with rapid ventricular response (HCC)   Acute systolic CHF (congestive heart failure) (HCC)   Pleural effusion on right    Plan: Continue current treatment including diuresis.    LOS: 4 days   Ricardo Garrett L 01/24/2016, 11:40 AM

## 2016-01-25 DIAGNOSIS — E8809 Other disorders of plasma-protein metabolism, not elsewhere classified: Secondary | ICD-10-CM

## 2016-01-25 DIAGNOSIS — R601 Generalized edema: Secondary | ICD-10-CM

## 2016-01-25 DIAGNOSIS — I429 Cardiomyopathy, unspecified: Secondary | ICD-10-CM

## 2016-01-25 DIAGNOSIS — Z7901 Long term (current) use of anticoagulants: Secondary | ICD-10-CM

## 2016-01-25 DIAGNOSIS — N183 Chronic kidney disease, stage 3 (moderate): Secondary | ICD-10-CM

## 2016-01-25 DIAGNOSIS — J948 Other specified pleural conditions: Secondary | ICD-10-CM

## 2016-01-25 LAB — UIFE/LIGHT CHAINS/TP QN, 24-HR UR
% BETA, Urine: 9.4 %
ALPHA 1 URINE: 0.7 %
Albumin, U: 84.8 %
Alpha 2, Urine: 3.5 %
FREE LAMBDA LT CHAINS, UR: 4.51 mg/L (ref 0.24–6.66)
FREE LT CHN EXCR RATE: 13.7 mg/L (ref 1.35–24.19)
Free Kappa/Lambda Ratio: 3.04 (ref 2.04–10.37)
GAMMA GLOBULIN URINE: 1.5 %
TIME-UPE24: 24 h
TOTAL PROTEIN, URINE-UPE24: 194.4 mg/dL
TOTAL PROTEIN, URINE-UR/DAY: 3858.8 mg/(24.h) — AB (ref 30.0–150.0)
Volume, Urine: 1985 mL

## 2016-01-25 LAB — BASIC METABOLIC PANEL
ANION GAP: 11 (ref 5–15)
BUN: 22 mg/dL — ABNORMAL HIGH (ref 6–20)
CALCIUM: 8.3 mg/dL — AB (ref 8.9–10.3)
CHLORIDE: 95 mmol/L — AB (ref 101–111)
CO2: 32 mmol/L (ref 22–32)
Creatinine, Ser: 1.17 mg/dL (ref 0.61–1.24)
GFR calc non Af Amer: >60 mL/min (ref >60)
GLUCOSE: 92 mg/dL (ref 65–99)
Potassium: 3.6 mmol/L (ref 3.5–5.1)
Sodium: 138 mmol/L (ref 135–145)

## 2016-01-25 LAB — CBC
HEMATOCRIT: 46.7 % (ref 39.0–52.0)
HEMOGLOBIN: 15.3 g/dL (ref 13.0–17.0)
MCH: 29.5 pg (ref 26.0–34.0)
MCHC: 32.8 g/dL (ref 30.0–36.0)
MCV: 90.2 fL (ref 78.0–100.0)
Platelets: 267 10*3/uL (ref 150–400)
RBC: 5.18 MIL/uL (ref 4.22–5.81)
RDW: 14.8 % (ref 11.5–15.5)
WBC: 14.4 10*3/uL — ABNORMAL HIGH (ref 4.0–10.5)

## 2016-01-25 LAB — PH, BODY FLUID: pH, Body Fluid: 7.6

## 2016-01-25 MED ORDER — DIGOXIN 0.25 MG/ML IJ SOLN
0.5000 mg | Freq: Once | INTRAMUSCULAR | Status: AC
  Administered 2016-01-25: 0.5 mg via INTRAVENOUS
  Filled 2016-01-25: qty 2

## 2016-01-25 MED ORDER — FUROSEMIDE 40 MG PO TABS
40.0000 mg | ORAL_TABLET | Freq: Two times a day (BID) | ORAL | Status: DC
  Administered 2016-01-25 – 2016-01-26 (×2): 40 mg via ORAL
  Filled 2016-01-25 (×2): qty 1

## 2016-01-25 NOTE — Progress Notes (Signed)
Patient ID: Ricardo Garrett, male   DOB: February 21, 1946, 70 y.o.   MRN: [id number]     Subjective:    SOB is improving. He thinks he might go home tomorrow.   Objective:   Temp:  [98.1 F (36.7 C)-99.8 F (37.7 C)] 98.1 F (36.7 C) (03/27 0523) Pulse Rate:  [88-125] 88 (03/27 0523) Resp:  [20] 20 (03/27 0523) BP: (106-120)/(68-86) 118/68 mmHg (03/27 0523) SpO2:  [92 %-98 %] 97 % (03/27 0835) Weight:  [155 lb 3.3 oz (70.4 kg)] 155 lb 3.3 oz (70.4 kg) (03/27 0500) Last BM Date: 01/23/16  Filed Weights   01/23/16 1002 01/24/16 0648 01/25/16 0500  Weight: 168 lb 3.2 oz (76.295 kg) 162 lb 7.7 oz (73.7 kg) 155 lb 3.3 oz (70.4 kg)    Intake/Output Summary (Last 24 hours) at 01/25/16 0857 Last data filed at 01/25/16 [id number]  Gross per 24 hour  Intake    600 ml  Output   5125 ml  Net  -4525 ml    Telemetry: afib rates 90s-100s  Exam:  General: NAD  HEENT: sclera clear, throat clear  Resp: decreased breath sounds  Cardiac: irreg, no m/r/g, no jvd  GI: abdomen soft, NT, ND  MSK: trace bilateral LE edema  Neuro: no focal deficits  Psych: appropriate affect  Lab Results:  Basic Metabolic Panel:  Recent Labs Lab 01/23/16 0559 01/24/16 0559 01/25/16 0529  NA 139 140 138  K 3.9 3.7 3.6  CL 100* 99* 95*  CO2 28 30 32  GLUCOSE 94 91 92  BUN 26* 25* 22*  CREATININE 1.53* 1.35* 1.17  CALCIUM 8.6* 8.6* 8.3*    Liver Function Tests:  Recent Labs Lab 01/21/16 1052  AST 21  ALT 22  ALKPHOS 126  BILITOT 1.2  PROT 4.8*  ALBUMIN 2.6*    CBC:  Recent Labs Lab 01/23/16 0559 01/24/16 0559 01/25/16 0529  WBC 9.9 10.5 14.4*  HGB 14.2 14.8 15.3  HCT 43.8 46.5 46.7  MCV 91.1 91.2 90.2  PLT 278 300 267    Cardiac Enzymes: No results for input(s): CKTOTAL, CKMB, CKMBINDEX, TROPONINI in the last 168 hours.  BNP: No results for input(s): PROBNP in the last 8760 hours.  Coagulation:  Recent Labs Lab 01/20/16 1654  INR 1.11    ECG:   Medications:     Scheduled Medications: . antiseptic oral rinse  7 mL Mouth Rinse BID  . apixaban  5 mg Oral BID  . arformoterol  15 mcg Nebulization BID   And  . budesonide (PULMICORT) nebulizer solution  0.5 mg Nebulization BID  . atorvastatin  20 mg Oral q1800  . digoxin  0.125 mg Oral Daily  . furosemide  80 mg Intravenous BID  . guaiFENesin  1,200 mg Oral BID  . metoprolol succinate  37.5 mg Oral BID  . sertraline  50 mg Oral Daily  . sodium chloride flush  3 mL Intravenous Q12H  . tamsulosin  0.4 mg Oral QPC supper  . tiotropium  18 mcg Inhalation Daily    Infusions:    PRN Medications: levalbuterol     Assessment/Plan    1. Afib - Rate still a little fast on Toprol 37.5 mg. Not on Diltiazem secondary to CM, Lanoxin added over the weekend. - CHADS2Vasc score of 2 (age, CHF). On eliquis 5mg  bid.   2. COPD - management per Dr Luan Pulling. He seems to be tolerating Toprol   3. Acute systolic HF - echo LVEF [id number], appears to be new  finding. 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 Would not consider ischemc evaluation at this   time readdress as outpatient.   4. Pleural effusion - s/p thoracentesis with 580mL removed, followed by Dr Luan Pulling.   5. Renal insufficiency - his SCr improved with diuresis, now normal.   Plan: MD to see- will discuss diuretic dose. Consider increasing Toprol to 50 mg.   Kerin Ransom PA-C 01/25/2016 9:08 AM  The patient was seen and examined, and I agree with the assessment and plan as documented above, with modifications as noted below. Has had significant diuresis since admission. Wants to go home. Will switch IV Lasix to 40 mg po BID. IV digoxin administered 3/26 and oral to start today. Will give another dose of IV digoxin x 1 this morning as well given elevated rates. Not certain increasing Toprol-XL dose further  may lead to bronchospastic exacerbation given COPD with recent exacerbation. For now, continue 37.5 mg BID. Should be ready for discharge today or tomorrow. Will need close f/u with Dr. Johnsie Cancel or Harl Bowie.  Kate Sable, MD, [facility]  01/25/2016 9:37 AM

## 2016-01-25 NOTE — Progress Notes (Signed)
[name] for Ricardo Garrett (Apixaban) Indication: nonvalvular atrial fibrillation  No Known Allergies  Patient Measurements: Height: 5\' 8"  (172.7 cm) Weight: 155 lb 3.3 oz (70.4 kg) IBW/kg (Calculated) : 68.4  Vital Signs: Temp: 98.1 F (36.7 C) (03/27 0523) Temp Source: Oral (03/27 0523) BP: 118/68 mmHg (03/27 0523) Pulse Rate: 88 (03/27 0523)  Labs:  Recent Labs  01/23/16 0559 01/24/16 0559 01/25/16 0529  HGB 14.2 14.8 15.3  HCT 43.8 46.5 46.7  PLT 278 300 267  CREATININE 1.53* 1.35* 1.17   Estimated Creatinine Clearance: 56 mL/min (by C-G formula based on Cr of 1.17).  Medical History: Past Medical History  Diagnosis Date  . High cholesterol   . Broken arm    Medications:  Scheduled:  . antiseptic oral rinse  7 mL Mouth Rinse BID  . apixaban  5 mg Oral BID  . arformoterol  15 mcg Nebulization BID   And  . budesonide (PULMICORT) nebulizer solution  0.5 mg Nebulization BID  . atorvastatin  20 mg Oral q1800  . digoxin  0.125 mg Oral Daily  . furosemide  40 mg Oral BID  . guaiFENesin  1,200 mg Oral BID  . metoprolol succinate  37.5 mg Oral BID  . sertraline  50 mg Oral Daily  . sodium chloride flush  3 mL Intravenous Q12H  . tamsulosin  0.4 mg Oral QPC supper  . tiotropium  18 mcg Inhalation Daily   Assessment: 70 yo male with nonvalvular atrial fibrillation, presently on heparin. Pharmacy protocol to change therapy to apixaban Age < 80 , weight > 60 kg, SCR 1.62   Goal of Therapy:  Stroke prevention with non valvular atrial fibrillation Monitor platelets by anticoagulation protocol: Yes   Plan:  Apixaban 5 mg po twice daily Monitor CBC, platelets, signs of bleeding Education complete  Hart Robinsons A 01/25/2016,12:19 PM

## 2016-01-25 NOTE — Progress Notes (Signed)
Subjective: He seems to be improving daily. He has no new complaints. He says his breathing is doing well  Objective: Vital signs in last 24 hours: Temp:  [98.1 F (36.7 C)-99.8 F (37.7 C)] 98.1 F (36.7 C) (03/27 0523) Pulse Rate:  [88-125] 88 (03/27 0523) Resp:  [20] 20 (03/27 0523) BP: (106-120)/(68-86) 118/68 mmHg (03/27 0523) SpO2:  [92 %-98 %] 97 % (03/27 0835) Weight:  [70.4 kg (155 lb 3.3 oz)] 70.4 kg (155 lb 3.3 oz) (03/27 0500) Weight change: -5.895 kg (-12 lb 15.9 oz) Last BM Date: 01/23/16  Intake/Output from previous day: 03/26 0701 - 03/27 0700 In: 600 [P.O.:600] Out: 5125 [Urine:5125]  PHYSICAL EXAM General appearance: alert, cooperative and no distress Resp: clear to auscultation bilaterally Cardio: irregularly irregular rhythm GI: soft, non-tender; bowel sounds normal; no masses,  no organomegaly Extremities: He still has edema of the pretibial area  Lab Results:  Results for orders placed or performed during the hospital encounter of 01/20/16 (from the past 48 hour(s))  Blood gas, arterial     Status: Abnormal   Collection Time: 01/23/16  7:10 PM  Result Value Ref Range   O2 Content 2.0 L/min   Delivery systems NASAL CANNULA    pH, Arterial 7.407 7.350 - 7.450   pCO2 arterial 49.8 (H) 35.0 - 45.0 mmHg   pO2, Arterial 85.2 80.0 - 100.0 mmHg   Bicarbonate 29.1 (H) 20.0 - 24.0 mEq/L   Acid-Base Excess 6.1 (H) 0.0 - 2.0 mmol/L   O2 Saturation 95.4 %   Collection site BRACHIAL ARTERY    Drawn by [id number]    Sample type ARTERIAL    Allens test (pass/fail) NOT INDICATED (A) PASS  Basic metabolic panel     Status: Abnormal   Collection Time: 01/24/16  5:59 AM  Result Value Ref Range   Sodium 140 135 - 145 mmol/L   Potassium 3.7 3.5 - 5.1 mmol/L   Chloride 99 (L) 101 - 111 mmol/L   CO2 30 22 - 32 mmol/L   Glucose, Bld 91 65 - 99 mg/dL   BUN 25 (H) 6 - 20 mg/dL   Creatinine, Ser 1.35 (H) 0.61 - 1.24 mg/dL   Calcium 8.6 (L) 8.9 - 10.3 mg/dL   GFR calc  non Af Amer 51 (L) >60 mL/min   GFR calc Af Amer 59 (L) >60 mL/min    Comment: (NOTE) The eGFR has been calculated using the CKD EPI equation. This calculation has not been validated in all clinical situations. eGFR's persistently <60 mL/min signify possible Chronic Kidney Disease.    Anion gap 11 5 - 15  CBC     Status: None   Collection Time: 01/24/16  5:59 AM  Result Value Ref Range   WBC 10.5 4.0 - 10.5 K/uL   RBC 5.10 4.22 - 5.81 MIL/uL   Hemoglobin 14.8 13.0 - 17.0 g/dL   HCT 46.5 39.0 - 52.0 %   MCV 91.2 78.0 - 100.0 fL   MCH 29.0 26.0 - 34.0 pg   MCHC 31.8 30.0 - 36.0 g/dL   RDW 15.0 11.5 - 15.5 %   Platelets 300 150 - 400 K/uL  Urinalysis, Routine w reflex microscopic (not at [facility])     Status: Abnormal   Collection Time: 01/24/16 10:47 AM  Result Value Ref Range   Color, Urine YELLOW YELLOW   APPearance CLEAR CLEAR   Specific Gravity, Urine 1.015 1.005 - 1.030   pH 6.0 5.0 - 8.0   Glucose, UA NEGATIVE  NEGATIVE mg/dL   Hgb urine dipstick MODERATE (A) NEGATIVE   Bilirubin Urine NEGATIVE NEGATIVE   Ketones, ur NEGATIVE NEGATIVE mg/dL   Protein, ur >300 (A) NEGATIVE mg/dL   Nitrite NEGATIVE NEGATIVE   Leukocytes, UA NEGATIVE NEGATIVE  Urine microscopic-add on     Status: Abnormal   Collection Time: 01/24/16 10:47 AM  Result Value Ref Range   Squamous Epithelial / LPF NONE SEEN NONE SEEN   WBC, UA NONE SEEN 0 - 5 WBC/hpf   RBC / HPF 6-30 0 - 5 RBC/hpf   Bacteria, UA RARE (A) NONE SEEN  Basic metabolic panel     Status: Abnormal   Collection Time: 01/25/16  5:29 AM  Result Value Ref Range   Sodium 138 135 - 145 mmol/L   Potassium 3.6 3.5 - 5.1 mmol/L   Chloride 95 (L) 101 - 111 mmol/L   CO2 32 22 - 32 mmol/L   Glucose, Bld 92 65 - 99 mg/dL   BUN 22 (H) 6 - 20 mg/dL   Creatinine, Ser 1.17 0.61 - 1.24 mg/dL   Calcium 8.3 (L) 8.9 - 10.3 mg/dL   GFR calc non Af Amer >60 >60 mL/min   GFR calc Af Amer >60 >60 mL/min    Comment: (NOTE) The eGFR has been calculated  using the CKD EPI equation. This calculation has not been validated in all clinical situations. eGFR's persistently <60 mL/min signify possible Chronic Kidney Disease.    Anion gap 11 5 - 15  CBC     Status: Abnormal   Collection Time: 01/25/16  5:29 AM  Result Value Ref Range   WBC 14.4 (H) 4.0 - 10.5 K/uL   RBC 5.18 4.22 - 5.81 MIL/uL   Hemoglobin 15.3 13.0 - 17.0 g/dL   HCT 46.7 39.0 - 52.0 %   MCV 90.2 78.0 - 100.0 fL   MCH 29.5 26.0 - 34.0 pg   MCHC 32.8 30.0 - 36.0 g/dL   RDW 14.8 11.5 - 15.5 %   Platelets 267 150 - 400 K/uL    ABGS  Recent Labs  01/23/16 1910  PHART 7.407  PO2ART 85.2  HCO3 29.1*   CULTURES Recent Results (from the past 240 hour(s))  Culture, body fluid-bottle     Status: None (Preliminary result)   Collection Time: 01/21/16 12:45 PM  Result Value Ref Range Status   Specimen Description FLUID RIGHT PLEURAL COLLECTED BY Neomia Dear  Final   Special Requests BOTTLES DRAWN AEROBIC AND ANAEROBIC 10CC EACH  Final   Culture NO GROWTH 2 DAYS  Final   Report Status PENDING  Incomplete  Gram stain     Status: None   Collection Time: 01/21/16 12:45 PM  Result Value Ref Range Status   Specimen Description FLUID RIGHT PLEURAL COLLECTED BY [facility]  Final   Special Requests NONE  Final   Gram Stain   Final    CYTOSPIN SMEAR WBC PRESENT, PREDOMINANTLY MONONUCLEAR NO ORGANISMS SEEN Performed at [facility]    Report Status 01/21/2016 FINAL  Final   Studies/Results: No results found.  Medications:  Prior to Admission:  Prescriptions prior to admission  Medication Sig Dispense Refill Last Dose  . acetaminophen (TYLENOL) 325 MG tablet Take 2 tablets (650 mg total) by mouth every 6 (six) hours as needed (mild pain).   unknown  . alfuzosin (UROXATRAL) 10 MG 24 hr tablet Take 10 mg by mouth daily with breakfast.    unknown  . ALPRAZolam (XANAX) 0.5 MG tablet  Take 0.5 mg by mouth 2 (two) times daily.   01/19/2016 at Unknown time  .  atorvastatin (LIPITOR) 20 MG tablet Take 20 mg by mouth daily.   01/19/2016 at Unknown time  . BREO ELLIPTA 200-25 MCG/INH AEPB Inhale 1 puff into the lungs daily.    01/20/2016 at Unknown time  . furosemide (LASIX) 40 MG tablet Take 40 mg by mouth daily. Reported on 01/20/2016   unknown  . HYDROcodone-acetaminophen (NORCO/VICODIN) 5-325 MG per tablet Take 1 tablet every 6 hours or take 2 tablets every 6 hours as needed for severe pain 240 tablet 0 Past Week at Unknown time  . naproxen (NAPROSYN) 500 MG tablet Take 500 mg by mouth 2 (two) times daily with a meal.    unknown  . sertraline (ZOLOFT) 50 MG tablet Take 50 mg by mouth daily.   unknown  . SPIRIVA HANDIHALER 18 MCG inhalation capsule    01/20/2016 at Unknown time   Scheduled: . antiseptic oral rinse  7 mL Mouth Rinse BID  . apixaban  5 mg Oral BID  . arformoterol  15 mcg Nebulization BID   And  . budesonide (PULMICORT) nebulizer solution  0.5 mg Nebulization BID  . atorvastatin  20 mg Oral q1800  . digoxin  0.125 mg Oral Daily  . furosemide  80 mg Intravenous BID  . guaiFENesin  1,200 mg Oral BID  . metoprolol succinate  37.5 mg Oral BID  . sertraline  50 mg Oral Daily  . sodium chloride flush  3 mL Intravenous Q12H  . tamsulosin  0.4 mg Oral QPC supper  . tiotropium  18 mcg Inhalation Daily   Continuous:  [id number]  Assesment: He was admitted with COPD exacerbation. He has acute chronic systolic heart failure. He is much improved from his COPD exacerbation. He says he has not been compliant with follow-up with his primary care physician I told him he is going to need to do that now. He is being diuresed. Active Problems:   Anticoagulated   COPD (chronic obstructive pulmonary disease) (HCC)   Anasarca   CKD (chronic kidney disease) stage 3, GFR 30-59 ml/min   Hypoalbuminemia   Atrial fibrillation with rapid ventricular response (HCC)   Acute systolic CHF (congestive heart failure) (HCC)   Pleural effusion on  right    Plan: I will plan to follow more peripherally. I will be glad to see him in my office if needed. He is already on Breo and Spiriva but I'm not sure he's really taking them. He does not list a rescue inhaler and he will need to have that prescribed when he is discharged  Thanks for allowing me to see him with you    LOS: 5 days   Courtenay Creger L 01/25/2016, 8:56 AM

## 2016-01-25 NOTE — Progress Notes (Signed)
[facility] PROGRESS NOTE  Ricardo Garrett [id number] DOB: Mar 18, 1946 DOA: 01/20/2016 PCP: Wende Neighbors, MD  Assessment/Plan: 1. COPD exacerbation. Continues to improve. Wheezing has resolved. Continue neb tx. 2. Significant right pleural effusion. Etiologies include CHF versus underlying malignancy. Thoracentesis 3/23 yielded 528mL of pleural fluid which is transudative, and likely related to CHF. 3. CKD stage 3. Creatinine 1.35. Avoid nephrotoxic drugs. NSAIDS have been discontinued. Creatinine has been improving with diuresis. Continue to follow. 4. Afib with RVR. CHADs of 3-4. Rate initially improved with oral diltiazem, but due to low EF, this has been changed to toprol. He is anticoagulated with eliquis. Continue current meds. ECHO as below. Heart rate further improved with digoxin. 5. Acute systolic CHF. Pt is on lasix 80mg  IV BID. Reports improvements in overall edema. Transitioned to oral diuretics per cardiology 6. Anasarca. Continue diuresis and monitor in the setting of CKD. Patient has significant proteinuria which will need to worked up as an outpatient 7. HLD. Continue statins 8. Urinary retention. Relieved with foley cath. UA revealed only rare bacteria and no signs of infection. Flomax added. Will likely need to discharge home with foley catheter in place. Will discuss with urology 9. Confusion. Possibly related to scheduled xanax. Xanax and pain medications have been discontinued and appears to be improving. He does not have any focal neurologic deficits. UA showed only rare bacteria. ABG showed PCO2 49.8 with normal pH.   Code Status: Full DVT prophylaxis: eliquis Family Communication: discussed with patient and family present at bedside. Disposition Plan: Anticipate discharge home in 24 hours.  Consultants:  Pulmonology  Cardiology  Procedures:  Thoracentesis 3/23 yielding 500 mL pleural fluid  ECHO on 3/23  - Left ventricle: The cavity size was mildly  dilated. Wall  thickness was increased in a pattern of moderate LVH. Systolic  function was moderately reduced. The estimated ejection fraction  was in the range of 35% to 40%. Diffuse hypokinesis. - Mitral valve: There was mild regurgitation. - Left atrium: The atrium was mildly dilated. - Right ventricle: The cavity size was mildly dilated. - Right atrium: The atrium was mildly dilated. - Atrial septum: No defect or patent foramen ovale was identified. - Pulmonary arteries: PA peak pressure: 47 mm Hg (S).  Antibiotics:  None  HPI/Subjective: Feeling better. Unable pass voiding trial today. Swelling improving.   Objective: Filed Vitals:   01/24/16 2124 01/25/16 0523  BP: 117/73 118/68  Pulse: 99 88  Temp: 98.2 F (36.8 C) 98.1 F (36.7 C)  Resp: 20 20    Intake/Output Summary (Last 24 hours) at 01/25/16 1009 Last data filed at 01/25/16 0727  Gross per 24 hour  Intake    600 ml  Output   5125 ml  Net  -4525 ml   Filed Weights   01/23/16 1002 01/24/16 0648 01/25/16 0500  Weight: 76.295 kg (168 lb 3.2 oz) 73.7 kg (162 lb 7.7 oz) 70.4 kg (155 lb 3.3 oz)    Exam: General: NAD, looks comfortable Cardiovascular: RRR, S1, S2  Respiratory: diminished breath sounds on right side Abdomen: soft, non tender, no distention , bowel sounds normal Musculoskeletal: 1+edema b/l  Data Reviewed:  Basic Metabolic Panel:  Recent Labs Lab 01/21/16 0537 01/22/16 0609 01/23/16 0559 01/24/16 0559 01/25/16 0529  NA 142 140 139 140 138  K 4.3 4.1 3.9 3.7 3.6  CL 108 105 100* 99* 95*  CO2 28 26 28 30  32  GLUCOSE 98 105* 94 91 92  BUN 27* 30* 26* 25* 22*  CREATININE 1.61* 1.62* 1.53* 1.35* 1.17  CALCIUM 8.2* 8.4* 8.6* 8.6* 8.3*   Liver Function Tests:  Recent Labs Lab 01/21/16 1052  AST 21  ALT 22  ALKPHOS 126  BILITOT 1.2  PROT 4.8*  ALBUMIN 2.6*     Recent Labs Lab 01/21/16 0537 01/22/16 0609 01/23/16 0559 01/24/16 0559 01/25/16 0529  WBC 8.4 9.4 9.9  10.5 14.4*  HGB 13.8 13.7 14.2 14.8 15.3  HCT 43.6 43.1 43.8 46.5 46.7  MCV 92.8 91.7 91.1 91.2 90.2  PLT 260 251 278 300 267     Recent Results (from the past 240 hour(s))  Culture, body fluid-bottle     Status: None (Preliminary result)   Collection Time: 01/21/16 12:45 PM  Result Value Ref Range Status   Specimen Description FLUID RIGHT PLEURAL COLLECTED BY Neomia Dear  Final   Special Requests BOTTLES DRAWN AEROBIC AND ANAEROBIC 10CC EACH  Final   Culture NO GROWTH 2 DAYS  Final   Report Status PENDING  Incomplete  Gram stain     Status: None   Collection Time: 01/21/16 12:45 PM  Result Value Ref Range Status   Specimen Description FLUID RIGHT PLEURAL COLLECTED BY [facility]  Final   Special Requests NONE  Final   Gram Stain   Final    CYTOSPIN SMEAR WBC PRESENT, PREDOMINANTLY MONONUCLEAR NO ORGANISMS SEEN Performed at [facility]    Report Status 01/21/2016 FINAL  Final     Studies: No results found.  Scheduled Meds: . antiseptic oral rinse  7 mL Mouth Rinse BID  . apixaban  5 mg Oral BID  . arformoterol  15 mcg Nebulization BID   And  . budesonide (PULMICORT) nebulizer solution  0.5 mg Nebulization BID  . atorvastatin  20 mg Oral q1800  . digoxin  0.5 mg Intravenous Once  . digoxin  0.125 mg Oral Daily  . furosemide  40 mg Oral BID  . guaiFENesin  1,200 mg Oral BID  . metoprolol succinate  37.5 mg Oral BID  . sertraline  50 mg Oral Daily  . sodium chloride flush  3 mL Intravenous Q12H  . tamsulosin  0.4 mg Oral QPC supper  . tiotropium  18 mcg Inhalation Daily   Continuous Infusions:    Active Problems:   Anticoagulated   COPD (chronic obstructive pulmonary disease) (HCC)   Anasarca   CKD (chronic kidney disease) stage 3, GFR 30-59 ml/min   Hypoalbuminemia   Atrial fibrillation with rapid ventricular response (HCC)   Acute systolic CHF (congestive heart failure) (HCC)   Pleural effusion on right    Time spent: 25  minutes    Kathie Dike, MD. [facility] Pager [phone]. If 7PM-7AM, please contact night-coverage at [email or web address], password [email or web address] 01/25/2016, 10:09 AM  LOS: 5 days

## 2016-01-26 DIAGNOSIS — R358 Other polyuria: Secondary | ICD-10-CM

## 2016-01-26 DIAGNOSIS — R338 Other retention of urine: Secondary | ICD-10-CM

## 2016-01-26 LAB — BASIC METABOLIC PANEL
Anion gap: 10 (ref 5–15)
BUN: 24 mg/dL — AB (ref 6–20)
CO2: 31 mmol/L (ref 22–32)
CREATININE: 1.21 mg/dL (ref 0.61–1.24)
Calcium: 8.1 mg/dL — ABNORMAL LOW (ref 8.9–10.3)
Chloride: 94 mmol/L — ABNORMAL LOW (ref 101–111)
GFR calc Af Amer: >60 mL/min (ref >60)
GFR, EST NON AFRICAN AMERICAN: 58 mL/min — AB (ref >60)
GLUCOSE: 93 mg/dL (ref 65–99)
Potassium: 3.5 mmol/L (ref 3.5–5.1)
SODIUM: 135 mmol/L (ref 135–145)

## 2016-01-26 LAB — CULTURE, BODY FLUID W GRAM STAIN -BOTTLE: Culture: NO GROWTH

## 2016-01-26 LAB — CBC
HCT: 44.8 % (ref 39.0–52.0)
Hemoglobin: 14.9 g/dL (ref 13.0–17.0)
MCH: 30 pg (ref 26.0–34.0)
MCHC: 33.3 g/dL (ref 30.0–36.0)
MCV: 90.1 fL (ref 78.0–100.0)
PLATELETS: 258 10*3/uL (ref 150–400)
RBC: 4.97 MIL/uL (ref 4.22–5.81)
RDW: 14.7 % (ref 11.5–15.5)
WBC: 13.2 10*3/uL — AB (ref 4.0–10.5)

## 2016-01-26 LAB — CULTURE, BODY FLUID-BOTTLE

## 2016-01-26 MED ORDER — METOPROLOL SUCCINATE ER 25 MG PO TB24: 37.5000 mg | ORAL_TABLET | Freq: Two times a day (BID) | ORAL | Status: DC

## 2016-01-26 MED ORDER — GUAIFENESIN ER 600 MG PO TB12: 600.0000 mg | ORAL_TABLET | Freq: Two times a day (BID) | ORAL | Status: DC

## 2016-01-26 MED ORDER — ALFUZOSIN HCL ER 10 MG PO TB24: 10.0000 mg | ORAL_TABLET | Freq: Every day | ORAL | Status: DC

## 2016-01-26 MED ORDER — FUROSEMIDE 40 MG PO TABS: 40.0000 mg | ORAL_TABLET | Freq: Two times a day (BID) | ORAL | Status: DC

## 2016-01-26 MED ORDER — DIGOXIN 125 MCG PO TABS: 0.1250 mg | ORAL_TABLET | Freq: Every day | ORAL | Status: DC

## 2016-01-26 MED ORDER — APIXABAN 5 MG PO TABS: 5.0000 mg | ORAL_TABLET | Freq: Two times a day (BID) | ORAL | Status: DC

## 2016-01-26 NOTE — Consult Note (Signed)
Urology Consult  Consulting [id number]  CC: Difficulty urinating  HPI: This is a 70 year old male recently admitted for heart failure, ready to go home. The patient experienced, prior to his hospitalization, significant lower urinary tract symptoms of frequency, urgency, slow stream, feeling of incomplete emptying. He now has a Foley catheter for management of his urinary retention while in the hospital with diuresis. Urologic consultation is requested for follow-up of his urinary symptoms as well as his bladder outlet obstruction.  PMH: Past Medical History  Diagnosis Date  . High cholesterol   . Broken arm     PSH: Past Surgical History  Procedure Laterality Date  . Tonsillectomy    . Skin graft    . Compression hip screw Right 03/15/2013    Procedure: COMPRESSION HIP;  Surgeon: Sanjuana Kava, MD;  Location: [facility];  Service: Orthopedics;  Laterality: Right;    Allergies: No Known Allergies  Medications: Prescriptions prior to admission  Medication Sig Dispense Refill Last Dose  . acetaminophen (TYLENOL) 325 MG tablet Take 2 tablets (650 mg total) by mouth every 6 (six) hours as needed (mild pain).   unknown  . alfuzosin (UROXATRAL) 10 MG 24 hr tablet Take 10 mg by mouth daily with breakfast.    unknown  . ALPRAZolam (XANAX) 0.5 MG tablet Take 0.5 mg by mouth 2 (two) times daily.   01/19/2016 at Unknown time  . atorvastatin (LIPITOR) 20 MG tablet Take 20 mg by mouth daily.   01/19/2016 at Unknown time  . BREO ELLIPTA 200-25 MCG/INH AEPB Inhale 1 puff into the lungs daily.    01/20/2016 at Unknown time  . HYDROcodone-acetaminophen (NORCO/VICODIN) 5-325 MG per tablet Take 1 tablet every 6 hours or take 2 tablets every 6 hours as needed for severe pain 240 tablet 0 Past Week at Unknown time  . naproxen (NAPROSYN) 500 MG tablet Take 500 mg by mouth 2 (two) times daily with a meal.    unknown  . sertraline (ZOLOFT) 50 MG tablet Take 50 mg by mouth daily.   unknown  . SPIRIVA HANDIHALER  18 MCG inhalation capsule    01/20/2016 at Unknown time  . [DISCONTINUED] furosemide (LASIX) 40 MG tablet Take 40 mg by mouth daily. Reported on 01/20/2016   unknown     Social History: Social History   Social History  . Marital Status: Married    Spouse Name: N/A  . Number of Children: N/A  . Years of Education: N/A   Occupational History  . Not on file.   Social History Main Topics  . Smoking status: Former Research scientist (life sciences)  . Smokeless tobacco: Not on file  . Alcohol Use: Yes     Comment: occasional   . Drug Use: Yes    Special: Marijuana  . Sexual Activity: Not on file   Other Topics Concern  . Not on file   Social History Narrative    Family History: History reviewed. No pertinent family history.  Review of Systems: Positive: Lower urinary tract symptoms Negative: .  A further 10 point review of systems was negative except what is listed in the HPI.  Physical Exam: @VITALS2 @ General: No acute distress.  Awake. Head:  Normocephalic.  Atraumatic. Poor dentition. ENT:  EOMI.  Mucous membranes moist CV:              Regular rate. Pulmonary: Equal effort bilaterally.  Clear to auscultation bilaterally. Abdomen: SofNone tender to palpation. Skin:  Normal turgor.  No visible rash. Extremity: No gross  deformity of bilateral upper extremities.  No gross deformity of    bilateral lower extremities. Neurologic: Alert. Appropriate mood.   Urethra:  Foley catheter in place.  Orthotopic meatus. Scrotum: No lesions.  No ecchymosis.  No erythema. Testicles: Descended bilaterally.  No masses bilaterally. Epididymis: Palpable bilaterally.  Non Tender to palpation.  Studies:  Recent Labs     01/25/16  0529  01/26/16  0625  HGB  15.3  14.9  WBC  14.4*  13.2*  PLT  267  258    Recent Labs     01/25/16  0529  01/26/16  0625  NA  138  135  K  3.6  3.5  CL  95*  94*  CO2  32  31  BUN  22*  24*  CREATININE  1.17  1.21  CALCIUM  8.3*  8.1*  GFRNONAA  >60  58*  GFRAA   >60  >60     No results for input(s): INR, APTT in the last 72 hours.  Invalid input(s): PT   Invalid input(s): ABG    Assessment:   urinary retention-this may well be long-standing. Currently, this is managed with a Foley catheter while the patient is in the hospital. He is back on alpha blockers leave catheter in for now  We will contact patient for proper follow-up here in [place].  Send home on alpha blocker    [phone]

## 2016-01-26 NOTE — Discharge Summary (Signed)
Physician Discharge Summary  Ricardo Garrett [id number] DOB: 07/25/1946 DOA: 01/20/2016  PCP: Wende Neighbors, MD  Admit date: 01/20/2016 Discharge date: 01/26/2016  Time spent: 35 minutes  Recommendations for Outpatient Follow-up:  1. Follow up with PCP within 1-2 weeks. 2. Repeat CXR in 3-4 weeks.  3. Patient will need work up for proteinuria 4. Discharge with [place]. 5. Follow up with outpatient cardiology. 6. Follow up with outpatient Urology for removal of foley.   Discharge Diagnoses:  Active Problems:   Anticoagulated   COPD (chronic obstructive pulmonary disease) (HCC)   Anasarca   CKD (chronic kidney disease) stage 3, GFR 30-59 ml/min   Hypoalbuminemia   Atrial fibrillation with rapid ventricular response (HCC)   Acute systolic CHF (congestive heart failure) (HCC)   Pleural effusion on right   Discharge Condition: Improved   Diet recommendation: Heart healthy   Filed Weights   01/24/16 0648 01/25/16 0500 01/26/16 0544  Weight: 73.7 kg (162 lb 7.7 oz) 70.4 kg (155 lb 3.3 oz) 69.8 kg (153 lb 14.1 oz)    History of present illness:  1 yom with PMH with COPD, pulmonary nodule with atelectasis and consolidation presented in cardiology's office with complaints of anasarca and afib with RVR and was sent to [facility] for direct admission.  Hospital Course:  Patient on admission was noted to have significant right pleural effusion with etiologies including CHF verus underlying malignancy Thoracentesis 3/23 yielded 523mL of pleural fluid which is transudative, and likely related to CHF. Improvement noted in breathing with fluid removal.  1. COPD exacerbation. Wheezing has resolved. Continue neb tx. 2. CKD stage 3.  Avoid nephrotoxic drugs. NSAIDS have been discontinued. Creatinine has been improving with diuresis. Continue to follow. 3. Afib with RVR. CHADs of 3-4. Rate initially improved with oral diltiazem, but due to low EF, this has been changed to toprol. He is anticoagulated with  eliquis. Continue current meds. ECHO as below. Heart rate further improved with digoxin. 4. Acute systolic CHF. EF 35-45% on ECHO. He is on a beta-blocker. Would avoid ACE-I for now, until renal functions have stabilized.  Pt was on lasix 80mg  IV BID. Reported improvements in overall edema. Transitioned to oral diuretics per cardiology. Follow up with cardiology for further management.  5. Anasarca. Continue diuresis and monitor in the setting of CKD. Patient has significant proteinuria which will need to worked up as an outpatient 6. HLD. Continue statins 7. Urinary retention. Relieved with foley cath. UA revealed only rare bacteria and no signs of infection. Patient had not been receiving Alfusion since admission which could have lead to urinary retention. Flomax was added 3/26. Pt's foley was removed yesterday for voiding trial but he was unable to pass any urine. Urology has been consulted. Anticipate discharge home with foley catheter in place.  8. Confusion. Possibly related to scheduled xanax. Xanax and pain medications have been discontinued and appears to be improving. He does not have any focal neurologic deficits. UA showed only rare bacteria. ABG showed PCO2 49.8 with normal pH.  Procedures:  Thoracentesis 3/23 yielding 500 mL pleural fluid  ECHO on 3/23  - Left ventricle: The cavity size was mildly dilated. Wall  thickness was increased in a pattern of moderate LVH. Systolic  function was moderately reduced. The estimated ejection fraction  was in the range of 35% to 40%. Diffuse hypokinesis. - Mitral valve: There was mild regurgitation. - Left atrium: The atrium was mildly dilated. - Right ventricle: The cavity size was mildly dilated. - Right  atrium: The atrium was mildly dilated. - Atrial septum: No defect or patent foramen ovale was identified. - Pulmonary arteries: PA peak pressure: 47 mm Hg (S).  Consultations:  Pulmonology  Cardiology  Discharge Exam: Filed  Vitals:   01/26/16 0544 01/26/16 1347  BP: 118/62 119/76  Pulse: 70 75  Temp: 98.1 F (36.7 C) 98.3 F (36.8 C)  Resp: 18 18    General: NAD Cardiovascular: Irregular  Respiratory: clear bilaterally, No wheezing, rales or rhonchi Abdomen: soft, non tender, no distention , bowel sounds normal Musculoskeletal: Trace edema b/l  Discharge Instructions   Discharge Instructions    AMB Referral to [facility] Management    Complete by:  As directed   Please refer to [facility] for Transition of Care calls And refer to [facility] for evaluation of medication assistance for new Rx Eliquis. Patient with new oxygen dependent COPD, HF. Please note patient is hard of hearing, wife Ricardo Garrett is contact: [phone] Consent obtained at bedside.  Please contact for any questions or concerns.  Royetta Crochet. Niemczura, RN, BSN, [place] [phone]) Business Cell  [phone]) Toll Free Office  Reason for consult:  [facility] active  Diagnoses of:   Heart Failure COPD/ Pneumonia    Expected date of contact:  1-3 days (reserved for hospital discharges)     Diet - low sodium heart healthy    Complete by:  As directed      Increase activity slowly    Complete by:  As directed           Current Discharge Medication List    START taking these medications   Details  apixaban (ELIQUIS) 5 MG TABS tablet Take 1 tablet (5 mg total) by mouth 2 (two) times daily. Qty: 60 tablet, Refills: 1    digoxin (LANOXIN) 0.125 MG tablet Take 1 tablet (0.125 mg total) by mouth daily. Qty: 30 tablet, Refills: 1    guaiFENesin (MUCINEX) 600 MG 12 hr tablet Take 1 tablet (600 mg total) by mouth 2 (two) times daily. Qty: 30 tablet, Refills: 0    metoprolol succinate (TOPROL-XL) 25 MG 24 hr tablet Take 1.5 tablets (37.5 mg total) by mouth 2 (two) times daily. Qty: 90 tablet, Refills: 1      CONTINUE these medications which have CHANGED   Details  alfuzosin  (UROXATRAL) 10 MG 24 hr tablet Take 1 tablet (10 mg total) by mouth daily with breakfast. Qty: 30 tablet, Refills: 1    furosemide (LASIX) 40 MG tablet Take 1 tablet (40 mg total) by mouth 2 (two) times daily. Reported on 01/20/2016 Qty: 60 tablet, Refills: 1      CONTINUE these medications which have NOT CHANGED   Details  acetaminophen (TYLENOL) 325 MG tablet Take 2 tablets (650 mg total) by mouth every 6 (six) hours as needed (mild pain).    ALPRAZolam (XANAX) 0.5 MG tablet Take 0.5 mg by mouth 2 (two) times daily.    atorvastatin (LIPITOR) 20 MG tablet Take 20 mg by mouth daily.    BREO ELLIPTA 200-25 MCG/INH AEPB Inhale 1 puff into the lungs daily.     HYDROcodone-acetaminophen (NORCO/VICODIN) 5-325 MG per tablet Take 1 tablet every 6 hours or take 2 tablets every 6 hours as needed for severe pain Qty: 240 tablet, Refills: 0    sertraline (ZOLOFT) 50 MG tablet Take 50 mg by mouth daily.    SPIRIVA HANDIHALER 18 MCG inhalation capsule  STOP taking these medications     naproxen (NAPROSYN) 500 MG tablet        No Known Allergies Follow-up Information    Follow up with [name], NP On 02/09/2016.   Specialties:  Nurse Practitioner, Radiology, Cardiology   Why:  at 1:00 pm   Contact information:   [place] [address] [phone]       Follow up with [name], MD On 02/24/2016.   Specialty:  Cardiology   Why:  at 9:20 am   Contact information:   [address] [place] [address] [phone]       Follow up with Jorja Loa, MD.   Specialty:  Urology   Why:  We will call you to set up appointment   Contact information:   [address] [place] [address] [phone]        The results of significant diagnostics from this hospitalization (including imaging, microbiology, ancillary and laboratory) are listed below for reference.    Significant Diagnostic Studies: Dg Chest 1  View  01/21/2016  CLINICAL DATA:  Post thoracentesis EXAM: CHEST 1 VIEW COMPARISON:  01/20/2016 FINDINGS: Small right pleural effusion, decreased status post thoracentesis. Associated right lower lobe opacity, likely atelectasis. Trace left pleural effusion. No pneumothorax. Cardiomegaly. IMPRESSION: Small right pleural effusion, decreased. No pneumothorax status post thoracentesis. Electronically Signed   By: Julian Hy M.D.   On: 01/21/2016 13:34   US Renal  01/11/2016  CLINICAL DATA:  Kidney dysfunction.  Frequent urination. EXAM: RENAL / URINARY TRACT ULTRASOUND COMPLETE COMPARISON:  Chest CT - 11/23/2015 FINDINGS: Right Kidney: Normal cortical thickness, echogenicity and size, measuring 11.7 cm in length. No focal renal lesions. No echogenic renal stones. No urinary obstruction. Left Kidney: Normal cortical thickness, echogenicity and size, measuring 11.9 cm in length. No focal renal lesions. No echogenic renal stones. No urinary obstruction. Bladder: Appears normal for degree of bladder distention. Bilateral ureteral jets are noted. Incidentally noted small to moderate-sized right-sided effusion, similar to chest CT performed 11/23/2015 IMPRESSION: 1. Unremarkable renal ultrasound. Specifically, no evidence of urinary obstruction or renal atrophy. 2. Instilling noted moderate size right-sided effusion, similar to chest CT performed 11/2015. Electronically Signed   By: Sandi Mariscal M.D.   On: 01/11/2016 15:04   US Carotid Bilateral  01/11/2016  CLINICAL DATA:  Visual loss, hyperlipidemia and tobacco use. EXAM: BILATERAL CAROTID DUPLEX ULTRASOUND TECHNIQUE: Pearline Cables scale imaging, color Doppler and duplex ultrasound were performed of bilateral carotid and vertebral arteries in the neck. COMPARISON:  None. FINDINGS: Criteria: Quantification of carotid stenosis is based on velocity parameters that correlate the residual internal carotid diameter with NASCET-based stenosis levels, using the diameter of the  distal internal carotid lumen as the denominator for stenosis measurement. The following velocity measurements were obtained: RIGHT ICA:  111/30 cm/sec CCA:  [id number] cm/sec SYSTOLIC ICA/CCA RATIO:  1.8 DIASTOLIC ICA/CCA RATIO:  2.8 ECA:  66 cm/sec LEFT ICA:  185/49 cm/sec CCA:  [id number] cm/sec SYSTOLIC ICA/CCA RATIO:  3.9 DIASTOLIC ICA/CCA RATIO:  6.0 ECA:  48 cm/sec RIGHT CAROTID ARTERY: There is a significant (moderately severe) amount of heterogeneous and partially calcified plaque at the level of the distal common carotid artery, carotid bulb and proximal ICA. Eccentric plaque in the proximal internal carotid artery is not associated with significant velocity elevation with estimated ICA stenosis of less than 50%. Stenosis may be close to 50% based on grayscale imaging. RIGHT VERTEBRAL ARTERY: Antegrade flow  with normal waveform and velocity. LEFT CAROTID ARTERY: Moderate amount of partially calcified plaque is present at the level of the common carotid artery, carotid bulb and proximal ICA. There is velocity elevation and turbulence noted in the proximal internal carotid artery with measured velocities corresponding to an estimated 50- 69% stenosis. LEFT VERTEBRAL ARTERY: Antegrade flow with normal waveform and velocity. IMPRESSION: 1. Moderately severe amount of plaque at the level of the right common carotid artery, carotid bulb and proximal ICA. Velocity measurements do not support a hemodynamically significant stenosis with estimated ICA stenosis of less than 50%. Stenosis likely is close to the 50% level. 2. Moderate plaque at the level of the left common carotid artery, carotid bulb and proximal ICA. Velocity elevation and turbulence corresponds to an estimated 50- 69% left ICA stenosis. Electronically Signed   By: Aletta Edouard M.D.   On: 01/11/2016 14:21   Dg Chest Port 1 View  01/20/2016  CLINICAL DATA:  Shortness of breath and wheezing for several months. Tachycardia. EXAM: PORTABLE CHEST 1 VIEW  COMPARISON:  CT chest 11/23/2015.  Chest 03/14/2013. FINDINGS: Persistent finding since recent chest CT with right lower lobe collapse and consolidation and right pleural effusion. Consolidative changes are progressing since the previous CT. Left lung is clear. Cardiac enlargement. No pneumothorax. IMPRESSION: Increasing consolidation in the right lower lung with persistent right pleural effusion. Followup PA and lateral chest X-ray is recommended in 3-4 weeks following trial of antibiotic therapy to ensure resolution and exclude underlying malignancy. Electronically Signed   By: Lucienne Capers M.D.   On: 01/20/2016 18:34   US Thoracentesis Asp Pleural Space W/img Guide  01/21/2016  INDICATION: Right pleural effusion EXAM: ULTRASOUND GUIDED RIGHT THORACENTESIS MEDICATIONS: None. COMPLICATIONS: None immediate. PROCEDURE: An ultrasound guided thoracentesis was thoroughly discussed with the patient and questions answered. The benefits, risks, alternatives and complications were also discussed. The patient understands and wishes to proceed with the procedure. Written consent was obtained. Ultrasound was performed to localize and mark an adequate pocket of fluid in the right chest. The area was then prepped and draped in the normal sterile fashion. 1% Lidocaine was used for local anesthesia. Under ultrasound guidance a 19 gauge, 7-cm, Yueh catheter was introduced. Thoracentesis was performed. The catheter was removed and a dressing applied. FINDINGS: A total of approximately 500 mL of yellow colored fluid was removed. Samples were sent to the laboratory as requested by the clinical team. IMPRESSION: Successful ultrasound guided right thoracentesis yielding 500 mL of pleural fluid. Electronically Signed   By: Julian Hy M.D.   On: 01/21/2016 13:32    Microbiology: Recent Results (from the past 240 hour(s))  Culture, body fluid-bottle     Status: None   Collection Time: 01/21/16 12:45 PM  Result Value  Ref Range Status   Specimen Description FLUID RIGHT PLEURAL COLLECTED BY Neomia Dear  Final   Special Requests BOTTLES DRAWN AEROBIC AND ANAEROBIC 10CC EACH  Final   Culture NO GROWTH 5 DAYS  Final   Report Status 01/26/2016 FINAL  Final  Gram stain     Status: None   Collection Time: 01/21/16 12:45 PM  Result Value Ref Range Status   Specimen Description FLUID RIGHT PLEURAL COLLECTED BY Neomia Dear  Final   Special Requests NONE  Final   Gram Stain   Final    CYTOSPIN SMEAR WBC PRESENT, PREDOMINANTLY MONONUCLEAR NO ORGANISMS SEEN Performed at [facility]    Report Status 01/21/2016 FINAL  Final  Labs: Basic Metabolic Panel:  Recent Labs Lab 01/22/16 0609 01/23/16 0559 01/24/16 0559 01/25/16 0529 01/26/16 0625  NA 140 139 140 138 135  K 4.1 3.9 3.7 3.6 3.5  CL 105 100* 99* 95* 94*  CO2 26 28 30  32 31  GLUCOSE 105* 94 91 92 93  BUN 30* 26* 25* 22* 24*  CREATININE 1.62* 1.53* 1.35* 1.17 1.21  CALCIUM 8.4* 8.6* 8.6* 8.3* 8.1*   Liver Function Tests:  Recent Labs Lab 01/21/16 1052  AST 21  ALT 22  ALKPHOS 126  BILITOT 1.2  PROT 4.8*  ALBUMIN 2.6*   CBC:  Recent Labs Lab 01/22/16 0609 01/23/16 0559 01/24/16 0559 01/25/16 0529 01/26/16 0625  WBC 9.4 9.9 10.5 14.4* 13.2*  HGB 13.7 14.2 14.8 15.3 14.9  HCT 43.1 43.8 46.5 46.7 44.8  MCV 91.7 91.1 91.2 90.2 90.1  PLT 251 278 300 267 258   Signed:  Kathie Dike, MD [facility] 01/26/2016, 3:03 PM     By signing my name below, I, Rennis Harding, attest that this documentation has been prepared under the direction and in the presence of Kathie Dike, MD. Electronically signed: Rennis Harding, Scribe. 01/26/2016 10:44am   I, Dr. Kathie Dike, personally performed the services described in this documentaiton. All medical record entries made by the scribe were at my direction and in my presence. I have reviewed the chart and agree that the record reflects my personal  performance and is accurate and complete  Kathie Dike, MD, 01/26/2016 3:03 PM

## 2016-01-26 NOTE — Progress Notes (Signed)
Patient alert and oriented, independent, VSS, pt. Tolerating diet well. No complaints of pain or nausea. Pt. Had IV removed tip intact. Pt. Had prescriptions given. Pt. Voiced understanding of discharge instructions with no further questions. Pt. Discharged via wheelchair with auxilliary.  

## 2016-01-26 NOTE — Consult Note (Signed)
   [facility] Inpatient Consult   01/26/2016  Ricardo Garrett 03/09/1946 [id number]  Met with the patient and spouse at bedside to offer [place] Management services as benefit of [facility]  insurance. Patient agreeable to services and will receive post hospital discharge call and will be evaluated for monthly home visits.  Of note, [facility] Care Management services does not replace or interfere with any services that are arranged by inpatient case management or social work.  For additional questions or referrals please contact:  Royetta Crochet. Laymond Purser, RN, BSN, [place] [phone]) Business Cell  [phone]) Toll Free Office

## 2016-01-26 NOTE — Care Management Important Message (Signed)
Important Message  Patient Details  Name: Ricardo Garrett MRN: [id number] Date of Birth: 09-28-1946   Medicare Important Message Given:  Yes    Sherald Barge, RN 01/26/2016, 3:27 PM

## 2016-01-26 NOTE — Care Management Note (Signed)
Case Management Note  Patient Details  Name: Javion Holmer MRN: [id number] Date of Birth: 03-16-1946  Subjective/Objective:                  Pt admitted with CHG. Pt is from home, lives with his wife and is ind with ADL's. Pt has PCP, transportation and no difficulty obtaining medications. Pt has never had [facility] services PTA. Pt has met requirements for home O2, is having urinary retention and will be discharged with foley. Pt has chosen [facility] for [facility] and DME needs. Pt understands HH has 48 hours to make their first visit. Benefits check completed for Eliquis - $8.25 co-pay, pt will be able to afford.   Action/Plan: Pt discharging home today. Romualdo Bolk, of [facility], made aware of DME and [facility] referral and will obtain pt info from chart. Port O2 tank will be delivered to pt's room prior to DC. No further CM needs identified.   Expected Discharge Date:      01/26/2016            Expected Discharge Plan:  [place]  In-House Referral:  NA  Discharge planning Services  CM Consult  Post Acute Care Choice:  Home Health, Durable Medical Equipment Choice offered to:  Patient, Spouse  DME Arranged:  Oxygen DME Agency:  [place]:  RN [facility] Agency:  [place]  Status of Service:  Completed, signed off  Medicare Important Message Given:  Yes Date Medicare IM Given:    Medicare IM give by:    Date Additional Medicare IM Given:    Additional Medicare Important Message give by:     If discussed at [place] of Stay Meetings, dates discussed:  01/26/2016  Additional Comments:  Sherald Barge, RN 01/26/2016, 3:24 PM

## 2016-01-27 ENCOUNTER — Other Ambulatory Visit: Payer: Self-pay | Admitting: *Deleted

## 2016-01-27 NOTE — Patient Outreach (Addendum)
[facility] [facility]) Care Management Transition of Care Outreach/Week #1  01/27/2016  Yaiden Felber 01/19/1946 [id number]  Unable to reach Mr. Hersi today at either provided telephone number [phone] OR [phone]. I will reach out to Mr. Lu Duffel again tomorrow.    [place] Management  [phone]

## 2016-01-28 ENCOUNTER — Other Ambulatory Visit: Payer: Self-pay | Admitting: *Deleted

## 2016-01-28 ENCOUNTER — Encounter: Payer: Self-pay | Admitting: *Deleted

## 2016-01-28 NOTE — Patient Outreach (Signed)
[facility] Care Management  01/28/2016  Ricardo Garrett 1945/11/02 [id number]   Ricardo Garrett is a 70 year old male with COPD, pulmonary nodule, and CKD stage 3. He presented to his cardiology office on 01/18/16 with complaints of anasarca and was found to atelectasis and consolidation on chest xray and afib with RVR. He was consequently sent to [facility] for direct admission and treated for COPD exacerbation, afib with RVR, and urinary retention., Ricardo Garrett also experienced confusion during his hospitalization which was thought to be related to medication side effect.   Ricardo Garrett discharged from [facility] on 01/26/16 to his home in [place]. He went home with foley catheter. Home health was arranged through [facility] - first visit is scheduled for Friday 01/29/16.  Ricardo Garrett is scheduled to see [name] NP [facility] [place]) on 02/09/16. Ricardo Garrett has a previously scheduled appointment with Dr. Wende Neighbors for primary care follow up. Ricardo Garrett anticipates a call from Dr. Alan Ripper office for follow up visit. I've asked Ricardo Garrett's wife to call me if he hasn't heard from the office by Friday. Ricardo Garrett has all prescribed medications on hand and is taking them as prescribed according to his wife. His pharmacy is [facility]. I am concerned that medication knowledge and adherence may be lacking. Ricardo Garrett has been referred to [facility] program for assistance with new medication Eliquis.   Ricardo Garrett and his wife have significant knowledge deficits related to Ricardo Garrett's various disease processes. While they both were able to verbalize in general why Ricardo Garrett was hospitalized, they had poor knowledge of specific disease processes and treatment plans. Ricardo Garrett agreed for me to visit them at home next week.   Plan: I will follow up with Ricardo Garrett by phone tomorrow.    [place] [place] Management  [phone]

## 2016-02-01 ENCOUNTER — Other Ambulatory Visit: Payer: Self-pay | Admitting: *Deleted

## 2016-02-01 NOTE — Patient Outreach (Signed)
[facility]) Care Management  02/01/2016  Ricardo Garrett Apr 25, 1946 [id number]  [name] is not at home today for our scheduled face to face visit. We spoke on Thursday 01/28/16 when [name] agreed to my visit. I was unable to get an answer at the door or by phone and was unable to leave a voice message.   Plan: I will reach out to [name] again later this week.    [place] Management  [phone]

## 2016-02-02 ENCOUNTER — Encounter: Payer: Self-pay | Admitting: *Deleted

## 2016-02-03 ENCOUNTER — Other Ambulatory Visit: Payer: Self-pay | Admitting: *Deleted

## 2016-02-03 NOTE — Patient Outreach (Signed)
[place] [facility]) Care Management  02/02/2016  Cylar Lapa 1946-06-09 [id number]  I reached out to Mrs. Manuel today to follow up on her request for assistance with getting Mr. Alspaugh scheduled at the urology office for removal of Mr. Yuhasz's foley catheter. When I spoke with her, she stated she received a confirmation of scheduled visit with Dr. Wende Neighbors for tomorrow 02/03/16 for foley removal. I provided an update to the urology office.   In addition, Mrs. Fortenberry related that she misunderstood that I was coming to the home yesterday for a visit. We rescheduled my initial home visit with Mr. Kult.   Plan:  Initial Home Visit Thursday, February 04, 2016 @ 1pm. The visit agenda is to assess Mr. Chakrabarti post discharge progress, review medications in detail, provide needed self monitoring equipment (scale and bp machine) and education.    [place] Management  [phone]

## 2016-02-04 ENCOUNTER — Other Ambulatory Visit: Payer: Self-pay | Admitting: *Deleted

## 2016-02-04 ENCOUNTER — Telehealth: Payer: Self-pay

## 2016-02-04 MED ORDER — METOPROLOL SUCCINATE ER 25 MG PO TB24: 25.0000 mg | ORAL_TABLET | Freq: Two times a day (BID) | ORAL | Status: DC

## 2016-02-04 NOTE — Telephone Encounter (Signed)
-----   Message from Clerance Lav, RN sent at 02/04/2016  2:29 PM EDT ----- Ricardo Garrett,  .  I'm seeing Ricardo Garrett at home for the first time today. He is a new referral to me post hospital d/c. He was admitted with anasaraca, CHF, COPD exacerbation, afib w/ RVR.   Taking Digoxin .125mg  qa, Lopresor 25mg  1.5 tab BID (NEW), Lasix 40mg  BID  Assessment:   BP 82/62 (manual x 2)  Pulse 59 (RRR) Weight 149lb (down a couple of pounds from hospital d/c) Not short of breath No chest pain Not dizzy Fatigued   Concerned that we might have him on to much Lopressor? I'm going to fill pill box for him between now and when he sees Curt Bears on Monday. Gave him scale and bp monitor.   Needs a lot of reinforcement on everything.   Can you ask about the Lopressor or other meds/adjustments?   Thanks,  [place] Management  [phone]    Weight 149lb (down a couple of pounds from hospital d/c)

## 2016-02-04 NOTE — Telephone Encounter (Signed)
    RE: low BP post hospital  Received: Today    Josue Hector, MD  Bernita Raisin, RN           Can decrease lopressor to 25 bid      I spoke with Janalyn Shy  RN, Care Management Coordinator and she changed his dose of Toprol in his pill box.

## 2016-02-04 NOTE — Patient Outreach (Signed)
[facility]) Care Management   02/04/2016  Dartanyon Turi [birth year] [id number]  Teran Giroux is an 70 y.o. male with COPD, pulmonary nodule, and CKD stage 3. He presented to his cardiology office on 01/18/16 with complaints of anasarca and was found to have atelectasis and consolidation on chest xray and afib with RVR. He was consequently sent to [facility] for direct admission and treated for COPD exacerbation, afib with RVR, and urinary retention., Mr. Ripoll also experienced confusion during his hospitalization which was thought to be related to medication side effect.   Mr. Burningham discharged from [facility] on 01/26/16 to his home in [place]. I am seeing Mr. Gupta and his wife at home today in response to a hospital referral for transition of care assessment and complex case management services.   Subjective: "I don't know about all this. I didn't know all this happened to me."  Objective:  BP 82/62 mmHg  Pulse 59  Ht 1.727 m (5\' 8" )  Wt 149 lb 4 oz (67.699 kg)  BMI 22.70 kg/m2  SpO2 94%  Review of Systems  Constitutional: Positive for malaise/fatigue.  HENT: Negative.   Eyes: Negative.   Respiratory: Negative for cough, sputum production, shortness of breath and wheezing.   Cardiovascular: Positive for chest pain and leg swelling. Negative for palpitations.       Trace edema both ankles  Gastrointestinal: Negative for heartburn, nausea, vomiting, abdominal pain, diarrhea and constipation.  Genitourinary: Negative for dysuria, urgency, frequency, hematuria and flank pain.  Musculoskeletal: Negative for falls.  Skin: Negative.   Neurological: Negative.   Psychiatric/Behavioral: Negative.     Physical Exam  Constitutional: He is oriented to person, place, and time. He appears well-developed and well-nourished. He is active.  Cardiovascular: Regular rhythm.  Bradycardia present.   Respiratory: Effort normal. He has decreased breath sounds  in the right lower field and the left lower field. He has no wheezes. He has no rhonchi. He has no rales.  GI: Soft. Bowel sounds are normal.  Neurological: He is alert and oriented to person, place, and time.  Skin: Skin is warm and dry.  Psychiatric: His speech is normal and behavior is normal. Judgment and thought content normal. His affect is labile. Cognition and memory are normal.    Encounter Medications:   Outpatient Encounter Prescriptions as of 02/04/2016  Medication Sig Note  . acetaminophen (TYLENOL) 325 MG tablet Take 2 tablets (650 mg total) by mouth every 6 (six) hours as needed (mild pain).   Marland Kitchen albuterol (PROVENTIL HFA;VENTOLIN HFA) 108 (90 Base) MCG/ACT inhaler Inhale 2 puffs into the lungs every 6 (six) hours as needed for wheezing or shortness of breath.   . alfuzosin (UROXATRAL) 10 MG 24 hr tablet Take 1 tablet (10 mg total) by mouth daily with breakfast.   . apixaban (ELIQUIS) 5 MG TABS tablet Take 1 tablet (5 mg total) by mouth 2 (two) times daily.   Marland Kitchen BREO ELLIPTA 200-25 MCG/INH AEPB Inhale 1 puff into the lungs daily.  01/20/2016: Received from: External Pharmacy  . digoxin (LANOXIN) 0.125 MG tablet Take 1 tablet (0.125 mg total) by mouth daily.   . furosemide (LASIX) 40 MG tablet Take 1 tablet (40 mg total) by mouth 2 (two) times daily. Reported on 01/20/2016   . metoprolol succinate (TOPROL-XL) 25 MG 24 hr tablet Take 1.5 tablets (37.5 mg total) by mouth 2 (two) times daily. 02/04/2016: New to patient; started 3/27 at hospital discharge; bp today 82/52 pulse  59 (checked manually)  . SPIRIVA HANDIHALER 18 MCG inhalation capsule  01/20/2016: Received from: External Pharmacy  . ALPRAZolam (XANAX) 0.5 MG tablet Take 0.5 mg by mouth 2 (two) times daily. Reported on 02/04/2016 02/04/2016: Does not want to take because it "makes me feel like a zombie"; does not have on hand  . atorvastatin (LIPITOR) 20 MG tablet Take 20 mg by mouth daily. Reported on 02/04/2016 02/04/2016: Has on hand; has  not been taken; put in pill box today  . guaiFENesin (MUCINEX) 600 MG 12 hr tablet Take 1 tablet (600 mg total) by mouth 2 (two) times daily. (Patient not taking: Reported on 02/04/2016) 02/04/2016: Not taking; states he has taken in the past but does not take now; none on hand  . HYDROcodone-acetaminophen (NORCO/VICODIN) 5-325 MG per tablet Take 1 tablet every 6 hours or take 2 tablets every 6 hours as needed for severe pain 02/04/2016: Not taking; prefers Naproxen 500mg ; has Naproxen 500mg  on hand prescribed for BID  . sertraline (ZOLOFT) 50 MG tablet Take 50 mg by mouth daily. Reported on 02/04/2016 02/04/2016: Has on hand; last filled 11/03/2015; does not want to take; wants to keep; educated on appropriate use (daily and not prn)   Assessment:  70 year old gentleman living in [place], [place] with his wife and 2 dogs and 1 cat in his home. Mr. Singerman was recently discharged from the hospital after admission for Anasarca/CHF/COPD Exacerbation. Mr. Kenoyer has very limited knowledge of his disease processes, medications, and plan of care.    Acute Health condition (hypotension) - Mr. Dubach's blood pressure and pulse was checked twice by me today; his bp was 82/62 and pulse was 59; he denies dizziness, lightheadedness, episodes of weakness, palpitations, chest pain, nausea, or other symptom other than fatigue; Lopressor is a new medication to him and he has been taking it as prescribed (see list below for medication adherence details); I contacted Dr. Kyla Balzarine office to notify the nurse Vernie Murders RN of these findings; an order was sent by Dr. Johnsie Cancel to decrease Mr. Fuquay's Lopressor to 25mg  BID; I provided Mr. Mcnair with a new electronic bp monitor and instructed him on use; Mr. Burdon is to check his bp daily and record the findings in his blue [facility] CM notebook   Chronic Health Condition (CHF) - Mr. Swiggett has been taking Lasix 40mg  BID as prescribed; his weight is 149 today, down 4 pounds from hospital  discharge; he had his foley catheter removed at Dr. Juel Burrow office yesterday and is voiding without difficulty; he has a trace of edema in both ankles, no abdominal swelling, breath sounds are clear although diminished in the bases, and no JVD is noted; Mr. Manahan is using O2 @ 2l/[place] but not continuously; I explained that supplemental O2 was a treatment for his heart as well as his COPD and reviewed the importance of his adherence to this treatment     Medication Non-Adherence and Management needs - Mr. Eye has not been taking all medications as prescribed since his discharge from the hospital. He and his wife reported by phone that he was taking all medications but upon examination of bottles and meds and with interview, Mr. Haymes admits to non-adherence.   Taking as prescribed:  metoprolol succinate (TOPROL-XL) 25 MG 24 hr tablet SPIRIVA HANDIHALER 18 MCG inhalation capsule - takes most days metoprolol succinate (TOPROL-XL) 25 MG 24 hr tablet (dose decreased to 25mg  BID as outlined above) furosemide (LASIX) 40 MG tablet digoxin (LANOXIN) 0.125  MG tablet apixaban (ELIQUIS) 5 MG TABS tablet alfuzosin (UROXATRAL) 10 MG 24 hr tablet albuterol (PROVENTIL HFA;VENTOLIN HFA) 108 (90 Base) MCG/ACT inhaler  On hand/not taking:  sertraline (ZOLOFT) 50 MG tablet - states he takes as needed; provided education about need to take daily for effectiveness; patient states he doesn't plan to start taking this medication atorvastatin (LIPITOR) 20 MG tablet - says he hasn't been taking BREO ELLIPTA 200-25 MCG/INH AEPB acetaminophen (TYLENOL) 325 MG tablet  On Med list/none on hand:  HYDROcodone-acetaminophen (NORCO/VICODIN) 5-325 MG per tablet - patient says he doesn't think he ever had this guaiFENesin (MUCINEX) 600 MG 12 hr tablet - states he doesn't want to take this and has not been taking it regularly ALPRAZolam (XANAX) 0.5 MG tablet -- could not produce a bottle but states he takes this prn  On  hand/prescribed to patient/not on current med list: Percocet (7.5-325) 03/19/2014 - states he takes "every once in a blue moon if something hurts real bad" Naproxen 500mg  12/05/2015 - prescribed by Dr. Luna Glasgow after femur fracture per patient; states he takes at least once daily  Plan:   Mr. Stauffer will check his bp and pulse and record it daily.   Mr. Jeanphilippe will call for new or worsened symptoms.   Mr. Abdullahi will attend his appointment with the urologist and the cardiologist on Tuesday, April 11.   I will see Mr. Quesenberry at home on Thursday for follow up and will perform venipuncture for needed labs at that time.   [facility] CM Care Plan Problem Two        Most Recent Value   Care Plan for Problem Two  Active   Interventions for Problem Two Long Term Goal   Discussed the importance of knoweldge of plan of care for management of urological concerns   [facility] Long Term Goal (31-90) days  Over the next 31 days, patient will verbalize understanding of plan of care for urology needs   [facility] Long Term Goal Start Date  02/04/16   [facility] CM Short Term Goal #1 (0-30 days)  Over the next 7 days, patient will attend scheduled urology appointment   [facility] CM Short Term Goal #1 Start Date  02/04/16   Interventions for Short Term Goal #2   confirmed upcoming urology appointment,  reinforced importance of attendance to urology appointment    [facility] CM Care Plan Problem Three        Most Recent Value   Care Plan Problem Three  Medication Non Adherence adn Knowledge Deficit   Role Documenting the Problem Three  Care Management Coordinator   Care Plan for Problem Three  Active   [facility] Long Term Goal (31-90) days  Over the next 60 days, patient will verbalize basic understanding of prescribed medications and will take medications daily   [facility] Long Term Goal Start Date  02/04/16   Interventions for Problem Three Long Term Goal  Thorough medication review,  filled pill boxes,  contacted provider for concerns about bp  management   [facility] CM Short Term Goal #1 (0-30 days)  Over the next 30 days, patient will attend scheduled provider appointments and will discuss medications with provider   [facility] CM Short Term Goal #1 Start Date  02/04/16   Interventions for Short Term Goal #1  confirmed upcoming provider appointments, reviewed with patient and spouse, wrote on calendar   [facility] CM Short Term Goal #2 (0-30 days)  Over the next 30 days, patient will take medications as prescribed  from pre-filled medication boxes   [facility] CM Short Term Goal #2 Start Date  02/04/16   Interventions for Short Term Goal #2  Med boxes filled,  Utilizing teachback method, reviewed importance of adherence to prescribed medication regimen      [place] Management  [phone]

## 2016-02-05 ENCOUNTER — Encounter: Payer: Self-pay | Admitting: *Deleted

## 2016-02-05 ENCOUNTER — Ambulatory Visit (INDEPENDENT_AMBULATORY_CARE_PROVIDER_SITE_OTHER): Payer: Commercial Managed Care - HMO

## 2016-02-05 VITALS — BP 104/60 | HR 56

## 2016-02-05 DIAGNOSIS — Z136 Encounter for screening for cardiovascular disorders: Secondary | ICD-10-CM | POA: Diagnosis not present

## 2016-02-05 DIAGNOSIS — IMO0001 Reserved for inherently not codable concepts without codable children: Secondary | ICD-10-CM

## 2016-02-05 NOTE — Progress Notes (Signed)
Manual BP 104/60 HR 56

## 2016-02-05 NOTE — Patient Instructions (Signed)
Continue to monitor BP with home machine.Make no changes in your medication unlesss we call you      Thank you for choosing [facility] !

## 2016-02-09 ENCOUNTER — Encounter: Payer: Self-pay | Admitting: Adult Health

## 2016-02-09 ENCOUNTER — Ambulatory Visit (INDEPENDENT_AMBULATORY_CARE_PROVIDER_SITE_OTHER): Payer: Commercial Managed Care - HMO | Admitting: Urology

## 2016-02-09 ENCOUNTER — Ambulatory Visit (INDEPENDENT_AMBULATORY_CARE_PROVIDER_SITE_OTHER): Payer: Commercial Managed Care - HMO | Admitting: Adult Health

## 2016-02-09 VITALS — BP 108/68 | HR 58 | Ht 68.0 in | Wt 152.0 lb

## 2016-02-09 DIAGNOSIS — N401 Enlarged prostate with lower urinary tract symptoms: Secondary | ICD-10-CM

## 2016-02-09 DIAGNOSIS — R351 Nocturia: Secondary | ICD-10-CM | POA: Diagnosis not present

## 2016-02-09 DIAGNOSIS — R3915 Urgency of urination: Secondary | ICD-10-CM

## 2016-02-09 DIAGNOSIS — I481 Persistent atrial fibrillation: Secondary | ICD-10-CM | POA: Diagnosis not present

## 2016-02-09 DIAGNOSIS — I5032 Chronic diastolic (congestive) heart failure: Secondary | ICD-10-CM

## 2016-02-09 DIAGNOSIS — I4819 Other persistent atrial fibrillation: Secondary | ICD-10-CM

## 2016-02-09 DIAGNOSIS — N3000 Acute cystitis without hematuria: Secondary | ICD-10-CM | POA: Diagnosis not present

## 2016-02-09 MED ORDER — POTASSIUM CHLORIDE CRYS ER 20 MEQ PO TBCR: 20.0000 meq | EXTENDED_RELEASE_TABLET | Freq: Every day | ORAL | Status: DC

## 2016-02-09 NOTE — Progress Notes (Signed)
Cardiology Office Note   Date:  02/09/2016   ID:  Ricardo Garrett, DOB 1946/05/06, MRN [id number]  PCP:  Wende Neighbors, MD  Cardiologist :Lamar Sprinkles, NP   No chief complaint on file.     History of Present Illness: Ricardo Garrett is a 70 y.o. male who presents for ongoing assessment and management of CHF.was recent admission for anasarca, chronic kidney disease, atrial fibrillation with RVR, acute on chronic systolic heart failure, with diuresis using IV, Lasix and transition to oral diuretics.  He was also noted to have atrial fibrillation with RVR was anticoagulated with ELIQUIS, but not placed on diltiazem, secondary to bradycardia and was transitioned to Toprol.  The patient also had a thoracentesis, removing 500 cc of pleural fluid.  He is here for post hospitalization followup.  Left ventricle: The cavity size was mildly dilated. Wall  thickness was increased in a pattern of moderate LVH. Systolic  function was moderately reduced. The estimated ejection fraction  was in the range of 35% to 40%. Diffuse hypokinesis. - Mitral valve: There was mild regurgitation. - Left atrium: The atrium was mildly dilated. - Right ventricle: The cavity size was mildly dilated. - Right atrium: The atrium was mildly dilated. - Atrial septum: No defect or patent foramen ovale was identified. - Pulmonary arteries: PA peak pressure: 47 mm Hg (S).  He comes today feeling well, he is being followed by [facility], he is medically compliant, weighing daily.  He denies any side effects from medications, which have been recently added.  Take the bandages, not causing any excessive bleeding or bruising.  The patient denies chest pain, palpitations, or edema.  His dry weight is 144 -146 pounds.   Past Medical History  Diagnosis Date  . High cholesterol   . Broken arm     Past Surgical History  Procedure Laterality Date  . Tonsillectomy    . Skin graft    . Compression hip screw  Right 03/15/2013    Procedure: COMPRESSION HIP;  Surgeon: Sanjuana Kava, MD;  Location: [facility];  Service: Orthopedics;  Laterality: Right;     Current Outpatient Prescriptions  Medication Sig Dispense Refill  . acetaminophen (TYLENOL) 325 MG tablet Take 2 tablets (650 mg total) by mouth every 6 (six) hours as needed (mild pain).    Marland Kitchen albuterol (PROVENTIL HFA;VENTOLIN HFA) 108 (90 Base) MCG/ACT inhaler Inhale 2 puffs into the lungs every 6 (six) hours as needed for wheezing or shortness of breath.    . alfuzosin (UROXATRAL) 10 MG 24 hr tablet Take 1 tablet (10 mg total) by mouth daily with breakfast. 30 tablet 1  . apixaban (ELIQUIS) 5 MG TABS tablet Take 1 tablet (5 mg total) by mouth 2 (two) times daily. 60 tablet 1  . atorvastatin (LIPITOR) 20 MG tablet Take 20 mg by mouth daily. Reported on 02/04/2016    . BREO ELLIPTA 200-25 MCG/INH AEPB Inhale 1 puff into the lungs daily.     . digoxin (LANOXIN) 0.125 MG tablet Take 1 tablet (0.125 mg total) by mouth daily. 30 tablet 1  . furosemide (LASIX) 40 MG tablet Take 1 tablet (40 mg total) by mouth 2 (two) times daily. Reported on 01/20/2016 60 tablet 1  . HYDROcodone-acetaminophen (NORCO/VICODIN) 5-325 MG per tablet Take 1 tablet every 6 hours or take 2 tablets every 6 hours as needed for severe pain 240 tablet 0  . metoprolol succinate (TOPROL XL) 25 MG 24 hr tablet Take 1 tablet (25 mg total) by  mouth 2 (two) times daily after a meal. 180 tablet 1  . SPIRIVA HANDIHALER 18 MCG inhalation capsule     . potassium chloride SA (KLOR-CON M20) 20 MEQ tablet Take 1 tablet (20 mEq total) by mouth daily. 30 tablet 6   No current facility-administered medications for this visit.    Allergies:   Review of patient's allergies indicates no known allergies.    Social History:  The patient  reports that he quit smoking about 36 years ago. He does not have any smokeless tobacco history on file. He reports that he drinks alcohol. He reports that he uses illicit  drugs (Marijuana).   Family History:  The patient's family history is not on file.    ROS: All other systems are reviewed and negative. Unless otherwise mentioned in H&P    PHYSICAL EXAM: VS:  BP 108/68 mmHg  Pulse 58  Ht 5\' 8"  (1.727 m)  Wt 152 lb (68.947 kg)  BMI 23.12 kg/m2  SpO2 97% , BMI Body mass index is 23.12 kg/(m^2). GEN: Well nourished, well developed, in no acute distress HEENT: normal Neck: no JVD, carotid bruits, or masses Cardiac: IRRR; no murmurs, rubs, or gallops,no edema  Respiratory:  clear to auscultation bilaterally, normal work of breathing GI: soft, nontender, nondistended, + BS MS: no deformity or atrophy Skin: warm and dry, no rash Neuro:  Strength and sensation are intact Psych: euthymic mood, full affect  Recent Labs: 01/20/2016: TSH 3.998 01/21/2016: ALT 22 01/26/2016: BUN 24*; Creatinine, Ser 1.21; Hemoglobin 14.9; Platelets 258; Potassium 3.5; Sodium 135    Lipid Panel No results found for: CHOL, TRIG, HDL, CHOLHDL, VLDL, LDLCALC, LDLDIRECT    Wt Readings from Last 3 Encounters:  02/09/16 152 lb (68.947 kg)  02/04/16 149 lb 4 oz (67.699 kg)  01/26/16 153 lb 14.1 oz (69.8 kg)     ASSESSMENT AND PLAN:  1. Atrial fibrillation, with RVR: The patient was unable to tolerate diltiazem due to significant bradycardia. He is tolerating metoprolol 25 mg BID.  ELIQUIS 5 mg twice a day.we will continue current medication regimen.  No changes at this time unless he becomes symptomatic or having issues with bleeding.  2. Acute anasarca: The patient is tolerating Lasix 40 mg by mouth twice a day with good diuresis.  He continues however to drink a lot of fluid.  I have asked him to decrease his fluid intake to avoid fluid retention.  Weight is up approximately 4 pounds since being seen last.  As stated above his dry weight is 144 245 pounds.  I have given him a daily weight chart, which she is to fill in and bring with him on next appointment.  He is also  being [facility].  He is to provide this information to them on followup.  I will add potassium 20 mEq daily, on discharge, his potassium was 3 point or.  He is not on an ACE inhibitor secondary to chronic kidney disease, however, last creatinine was 1.17. Really see him again in one month.  He will have a BMET completed.  He is to call us for any concerns or questions, also [facility] will be in touch with CC this document to them.  3. COPD: chronic dyspnea as a result.  He continues on inhalers.  The patient will follow up with his primary care physician, for ongoing management.   Current medicines are reviewed at length with the patient today.    Labs/ tests ordered  today include: BMET No orders of the defined types were placed in this encounter.     Disposition:   FU with 1 month Signed, [name], NP  02/09/2016 1:51 PM    [facility] [address], [place], [place] [address] Phone: [phone]; Fax: [phone]

## 2016-02-09 NOTE — Patient Instructions (Addendum)
Your physician recommends that you schedule a follow-up appointment in: 1 Month with [name], NP  Your physician has recommended you make the following change in your medication:   Start Potassium 20  Meq Take one Tablet Daily   Your physician has requested that you regularly monitor and record your blood pressure readings at home. Please use the same machine at the same time of day to check your readings and record them to bring to your follow-up visit.  Your physician recommends that you weigh, daily, at the same time every day, and in the same amount of clothing. Please record your daily weights on the handout provided and bring it to your next appointment.   If you need a refill on your cardiac medications before your next appointment, please call your pharmacy.  Thank you for choosing [facility]!

## 2016-02-09 NOTE — Progress Notes (Deleted)
Name: Ricardo Garrett    DOB: [birth year]  Age: 70 y.o.  MR#: [id number]       PCP:  Wende Neighbors, MD      Insurance: Payor: [email or web address] MEDICARE / Plan: [place] THN/NTSP / Product Type: *No Product type* /   CC:   No chief complaint on file.   VS Filed Vitals:   02/09/16 1303  BP: 108/68  Pulse: 58  Height: 5\' 8"  (1.727 m)  Weight: 152 lb (68.947 kg)  SpO2: 97%    Weights Current Weight  02/09/16 152 lb (68.947 kg)  02/04/16 149 lb 4 oz (67.699 kg)  01/26/16 153 lb 14.1 oz (69.8 kg)    Blood Pressure  BP Readings from Last 3 Encounters:  02/09/16 108/68  02/05/16 104/60  02/04/16 82/62     Admit date:  (Not on file) Last encounter with RMR:  Visit date not found   Allergy Review of patient's allergies indicates no known allergies.  Current Outpatient Prescriptions  Medication Sig Dispense Refill  . acetaminophen (TYLENOL) 325 MG tablet Take 2 tablets (650 mg total) by mouth every 6 (six) hours as needed (mild pain).    Marland Kitchen albuterol (PROVENTIL HFA;VENTOLIN HFA) 108 (90 Base) MCG/ACT inhaler Inhale 2 puffs into the lungs every 6 (six) hours as needed for wheezing or shortness of breath.    . alfuzosin (UROXATRAL) 10 MG 24 hr tablet Take 1 tablet (10 mg total) by mouth daily with breakfast. 30 tablet 1  . apixaban (ELIQUIS) 5 MG TABS tablet Take 1 tablet (5 mg total) by mouth 2 (two) times daily. 60 tablet 1  . atorvastatin (LIPITOR) 20 MG tablet Take 20 mg by mouth daily. Reported on 02/04/2016    . BREO ELLIPTA 200-25 MCG/INH AEPB Inhale 1 puff into the lungs daily.     . digoxin (LANOXIN) 0.125 MG tablet Take 1 tablet (0.125 mg total) by mouth daily. 30 tablet 1  . furosemide (LASIX) 40 MG tablet Take 1 tablet (40 mg total) by mouth 2 (two) times daily. Reported on 01/20/2016 60 tablet 1  . HYDROcodone-acetaminophen (NORCO/VICODIN) 5-325 MG per tablet Take 1 tablet every 6 hours or take 2 tablets every 6 hours as needed for severe pain 240 tablet 0  . metoprolol succinate  (TOPROL XL) 25 MG 24 hr tablet Take 1 tablet (25 mg total) by mouth 2 (two) times daily after a meal. 180 tablet 1  . SPIRIVA HANDIHALER 18 MCG inhalation capsule      No current facility-administered medications for this visit.    Discontinued Meds:    Medications Discontinued During This Encounter  Medication Reason  . sertraline (ZOLOFT) 50 MG tablet Error  . guaiFENesin (MUCINEX) 600 MG 12 hr tablet Error  . ALPRAZolam (XANAX) 0.5 MG tablet Error    Patient Active Problem List   Diagnosis Date Noted  . Acute systolic CHF (congestive heart failure) ([place]) 01/21/2016  . Pleural effusion on right   . Atrial fibrillation with rapid ventricular response ([place]) 01/20/2016  . Anxiety disorder 01/19/2016  . COPD (chronic obstructive pulmonary disease) ([place]) 01/19/2016  . Anasarca 01/19/2016  . CKD (chronic kidney disease) stage 3, GFR 30-59 ml/min 01/19/2016  . Hypoalbuminemia 01/19/2016  . Hip pain 04/29/2013  . Difficulty in walking(719.7) 04/29/2013  . Dermatitis 03/26/2013  . Other and unspecified hyperlipidemia 03/19/2013  . Unspecified constipation 03/19/2013  . Arrhythmia 03/19/2013  . Hyponatremia 03/19/2013  . Anticoagulated 03/19/2013  . Acute blood loss anemia 03/16/2013  .  Intertrochanteric fracture of right hip (HCC) 03/14/2013    LABS    Component Value Date/Time   NA 135 01/26/2016 0625   NA 138 01/25/2016 0529   NA 140 01/24/2016 0559   K 3.5 01/26/2016 0625   K 3.6 01/25/2016 0529   K 3.7 01/24/2016 0559   CL 94* 01/26/2016 0625   CL 95* 01/25/2016 0529   CL 99* 01/24/2016 0559   CO2 31 01/26/2016 0625   CO2 32 01/25/2016 0529   CO2 30 01/24/2016 0559   GLUCOSE 93 01/26/2016 0625   GLUCOSE 92 01/25/2016 0529   GLUCOSE 91 01/24/2016 0559   BUN 24* 01/26/2016 0625   BUN 22* 01/25/2016 0529   BUN 25* 01/24/2016 0559   CREATININE 1.21 01/26/2016 0625   CREATININE 1.17 01/25/2016 0529   CREATININE 1.35* 01/24/2016 0559   CALCIUM 8.1* 01/26/2016 0625    CALCIUM 8.3* 01/25/2016 0529   CALCIUM 8.6* 01/24/2016 0559   GFRNONAA 58* 01/26/2016 0625   GFRNONAA >60 01/25/2016 0529   GFRNONAA 51* 01/24/2016 0559   GFRAA >60 01/26/2016 0625   GFRAA >60 01/25/2016 0529   GFRAA 59* 01/24/2016 0559   CMP     Component Value Date/Time   NA 135 01/26/2016 0625   K 3.5 01/26/2016 0625   CL 94* 01/26/2016 0625   CO2 31 01/26/2016 0625   GLUCOSE 93 01/26/2016 0625   BUN 24* 01/26/2016 0625   CREATININE 1.21 01/26/2016 0625   CALCIUM 8.1* 01/26/2016 0625   PROT 4.8* 01/21/2016 1052   ALBUMIN 2.6* 01/21/2016 1052   AST 21 01/21/2016 1052   ALT 22 01/21/2016 1052   ALKPHOS 126 01/21/2016 1052   BILITOT 1.2 01/21/2016 1052   GFRNONAA 58* 01/26/2016 0625   GFRAA >60 01/26/2016 0625       Component Value Date/Time   WBC 13.2* 01/26/2016 0625   WBC 14.4* 01/25/2016 0529   WBC 10.5 01/24/2016 0559   HGB 14.9 01/26/2016 0625   HGB 15.3 01/25/2016 0529   HGB 14.8 01/24/2016 0559   HCT 44.8 01/26/2016 0625   HCT 46.7 01/25/2016 0529   HCT 46.5 01/24/2016 0559   MCV 90.1 01/26/2016 0625   MCV 90.2 01/25/2016 0529   MCV 91.2 01/24/2016 0559    Lipid Panel  No results found for: CHOL, TRIG, HDL, CHOLHDL, VLDL, LDLCALC, LDLDIRECT  ABG    Component Value Date/Time   PHART 7.407 01/23/2016 1910   PCO2ART 49.8* 01/23/2016 1910   PO2ART 85.2 01/23/2016 1910   HCO3 29.1* 01/23/2016 1910   O2SAT 95.4 01/23/2016 1910     Lab Results  Component Value Date   TSH 3.998 01/20/2016   BNP (last 3 results) No results for input(s): BNP in the last 8760 hours.  ProBNP (last 3 results) No results for input(s): PROBNP in the last 8760 hours.  Cardiac Panel (last 3 results) No results for input(s): CKTOTAL, CKMB, TROPONINI, RELINDX in the last 72 hours.  Iron/TIBC/Ferritin/ %Sat No results found for: IRON, TIBC, FERRITIN, IRONPCTSAT   EKG Orders placed or performed in visit on 01/20/16  . EKG 12-Lead     Prior Assessment and  Plan Problem List as of 02/09/2016      Cardiovascular and Mediastinum   Arrhythmia   Atrial fibrillation with rapid ventricular response (HCC)   Acute systolic CHF (congestive heart failure) (HCC)     Respiratory   COPD (chronic obstructive pulmonary disease) (HCC)   Pleural effusion on right     Digestive  Unspecified constipation     Musculoskeletal and Integument   Intertrochanteric fracture of right hip (HCC)   Dermatitis     Genitourinary   CKD (chronic kidney disease) stage 3, GFR 30-59 ml/min     Other   Acute blood loss anemia   Other and unspecified hyperlipidemia   Hyponatremia   Anticoagulated   Hip pain   Difficulty in walking(719.7)   Anxiety disorder   Anasarca   Hypoalbuminemia       Imaging: Dg Chest 1 View  01/21/2016  CLINICAL DATA:  Post thoracentesis EXAM: CHEST 1 VIEW COMPARISON:  01/20/2016 FINDINGS: Small right pleural effusion, decreased status post thoracentesis. Associated right lower lobe opacity, likely atelectasis. Trace left pleural effusion. No pneumothorax. Cardiomegaly. IMPRESSION: Small right pleural effusion, decreased. No pneumothorax status post thoracentesis. Electronically Signed   By: Julian Hy M.D.   On: 01/21/2016 13:34   US Renal  01/11/2016  CLINICAL DATA:  Kidney dysfunction.  Frequent urination. EXAM: RENAL / URINARY TRACT ULTRASOUND COMPLETE COMPARISON:  Chest CT - 11/23/2015 FINDINGS: Right Kidney: Normal cortical thickness, echogenicity and size, measuring 11.7 cm in length. No focal renal lesions. No echogenic renal stones. No urinary obstruction. Left Kidney: Normal cortical thickness, echogenicity and size, measuring 11.9 cm in length. No focal renal lesions. No echogenic renal stones. No urinary obstruction. Bladder: Appears normal for degree of bladder distention. Bilateral ureteral jets are noted. Incidentally noted small to moderate-sized right-sided effusion, similar to chest CT performed 11/23/2015 IMPRESSION:  1. Unremarkable renal ultrasound. Specifically, no evidence of urinary obstruction or renal atrophy. 2. Instilling noted moderate size right-sided effusion, similar to chest CT performed 11/2015. Electronically Signed   By: Sandi Mariscal M.D.   On: 01/11/2016 15:04   US Carotid Bilateral  01/11/2016  CLINICAL DATA:  Visual loss, hyperlipidemia and tobacco use. EXAM: BILATERAL CAROTID DUPLEX ULTRASOUND TECHNIQUE: Pearline Cables scale imaging, color Doppler and duplex ultrasound were performed of bilateral carotid and vertebral arteries in the neck. COMPARISON:  None. FINDINGS: Criteria: Quantification of carotid stenosis is based on velocity parameters that correlate the residual internal carotid diameter with NASCET-based stenosis levels, using the diameter of the distal internal carotid lumen as the denominator for stenosis measurement. The following velocity measurements were obtained: RIGHT ICA:  111/30 cm/sec CCA:  [id number] cm/sec SYSTOLIC ICA/CCA RATIO:  1.8 DIASTOLIC ICA/CCA RATIO:  2.8 ECA:  66 cm/sec LEFT ICA:  185/49 cm/sec CCA:  [id number] cm/sec SYSTOLIC ICA/CCA RATIO:  3.9 DIASTOLIC ICA/CCA RATIO:  6.0 ECA:  48 cm/sec RIGHT CAROTID ARTERY: There is a significant (moderately severe) amount of heterogeneous and partially calcified plaque at the level of the distal common carotid artery, carotid bulb and proximal ICA. Eccentric plaque in the proximal internal carotid artery is not associated with significant velocity elevation with estimated ICA stenosis of less than 50%. Stenosis may be close to 50% based on grayscale imaging. RIGHT VERTEBRAL ARTERY: Antegrade flow with normal waveform and velocity. LEFT CAROTID ARTERY: Moderate amount of partially calcified plaque is present at the level of the common carotid artery, carotid bulb and proximal ICA. There is velocity elevation and turbulence noted in the proximal internal carotid artery with measured velocities corresponding to an estimated 50- 69% stenosis. LEFT VERTEBRAL  ARTERY: Antegrade flow with normal waveform and velocity. IMPRESSION: 1. Moderately severe amount of plaque at the level of the right common carotid artery, carotid bulb and proximal ICA. Velocity measurements do not support a hemodynamically significant stenosis with estimated ICA stenosis of less than 50%.  Stenosis likely is close to the 50% level. 2. Moderate plaque at the level of the left common carotid artery, carotid bulb and proximal ICA. Velocity elevation and turbulence corresponds to an estimated 50- 69% left ICA stenosis. Electronically Signed   By: Aletta Edouard M.D.   On: 01/11/2016 14:21   Dg Chest Port 1 View  01/20/2016  CLINICAL DATA:  Shortness of breath and wheezing for several months. Tachycardia. EXAM: PORTABLE CHEST 1 VIEW COMPARISON:  CT chest 11/23/2015.  Chest 03/14/2013. FINDINGS: Persistent finding since recent chest CT with right lower lobe collapse and consolidation and right pleural effusion. Consolidative changes are progressing since the previous CT. Left lung is clear. Cardiac enlargement. No pneumothorax. IMPRESSION: Increasing consolidation in the right lower lung with persistent right pleural effusion. Followup PA and lateral chest X-ray is recommended in 3-4 weeks following trial of antibiotic therapy to ensure resolution and exclude underlying malignancy. Electronically Signed   By: Lucienne Capers M.D.   On: 01/20/2016 18:34   US Thoracentesis Asp Pleural Space W/img Guide  01/21/2016  INDICATION: Right pleural effusion EXAM: ULTRASOUND GUIDED RIGHT THORACENTESIS MEDICATIONS: None. COMPLICATIONS: None immediate. PROCEDURE: An ultrasound guided thoracentesis was thoroughly discussed with the patient and questions answered. The benefits, risks, alternatives and complications were also discussed. The patient understands and wishes to proceed with the procedure. Written consent was obtained. Ultrasound was performed to localize and mark an adequate pocket of fluid in the  right chest. The area was then prepped and draped in the normal sterile fashion. 1% Lidocaine was used for local anesthesia. Under ultrasound guidance a 19 gauge, 7-cm, Yueh catheter was introduced. Thoracentesis was performed. The catheter was removed and a dressing applied. FINDINGS: A total of approximately 500 mL of yellow colored fluid was removed. Samples were sent to the laboratory as requested by the clinical team. IMPRESSION: Successful ultrasound guided right thoracentesis yielding 500 mL of pleural fluid. Electronically Signed   By: Julian Hy M.D.   On: 01/21/2016 13:32

## 2016-02-10 ENCOUNTER — Other Ambulatory Visit: Payer: Self-pay | Admitting: *Deleted

## 2016-02-10 NOTE — Patient Outreach (Signed)
[facility]) Care Management  02/10/2016  Ricardo Garrett 01-08-1946 [id number]  I reached out to Ricardo Garrett today to follow up on his cardiology visit and remind him of our plans for a visit tomorrow, including labs. Ricardo Garrett says he has continued to weigh daily, check his blood pressure, and take medications as prescribed. I reminded him that he is not to eat prior to my visit tomorrow morning as we will be drawing labs. In addition, Ricardo Garrett was notified that our [place] pharmacist Karrie Meres will be visiting tomorrow as well. Ricardo Garrett stated "I'm glad you're coming and I'm glad to know how to live now."    [facility]  [phone]

## 2016-02-11 ENCOUNTER — Encounter: Payer: Self-pay | Admitting: Pharmacist

## 2016-02-11 ENCOUNTER — Other Ambulatory Visit: Payer: Self-pay | Admitting: *Deleted

## 2016-02-11 ENCOUNTER — Other Ambulatory Visit: Payer: Self-pay | Admitting: Pharmacist

## 2016-02-11 NOTE — Patient Outreach (Signed)
[facility]) Care Management   02/11/2016  Ricardo Garrett [birth year] [id number]  Ricardo Garrett is an 70 y.o. male with COPD, pulmonary nodule, and CKD stage 3. He presented to his cardiology office on 01/18/16 with complaints of anasarca and was found to have atelectasis and consolidation on chest xray and afib with RVR. He was consequently sent to [facility] for direct admission and treated for COPD exacerbation, afib with RVR, and urinary retention., Ricardo Garrett also experienced confusion during his hospitalization which was thought to be related to medication side effect.   [facility] Care Management was consulted while in hospital and transition of care services were initiated last week. Ricardo Garrett was found to be hypotensive at home on my initial visit with him and contact with cardiology was made wherein adjustments to his Lopressor dose was undertaken. Ricardo Garrett was provided with a digital scale, an electronic blood pressure monitor, and a blue [facility] Care Management notebook with bp, pulse, and weight documentation logs. In depth education was provided for CHF and HTN disease management. Thorough medication review with patient and spouse was completed.   Today, I am seeing Ricardo Garrett at home in collaboration with [facility] pharmacist Karrie Meres.   Subjective: "I think I'm doing better. Trying to keep up with all this is hard!"  Objective:  BP 109/70 mmHg  Pulse 76  Wt 144 lb 8 oz (65.545 kg)  SpO2 97%  Review of Systems  Constitutional: Negative.   HENT: Negative.   Eyes: Negative.   Respiratory: Negative.   Cardiovascular: Negative.   Gastrointestinal: Negative.   Genitourinary: Negative.   Musculoskeletal: Negative.   Skin: Negative.   Neurological: Negative.   Psychiatric/Behavioral: Negative.     Physical Exam  Constitutional: He is oriented to person, place, and time. Vital signs are normal. He appears well-developed and well-nourished.   Cardiovascular: Normal rate, regular rhythm, S1 normal and S2 normal.   Respiratory: Effort normal and breath sounds normal. He has no wheezes. He has no rhonchi. He has no rales.  GI: Soft. Bowel sounds are normal.  Neurological: He is alert and oriented to person, place, and time.  Skin: Skin is warm and dry.     Psychiatric: He has a normal mood and affect. His speech is normal and behavior is normal. Judgment and thought content normal. Cognition and memory are normal.    Encounter Medications:   Outpatient Encounter Prescriptions as of 02/11/2016  Medication Sig Note  . acetaminophen (TYLENOL) 325 MG tablet Take 2 tablets (650 mg total) by mouth every 6 (six) hours as needed (mild pain).   Marland Kitchen albuterol (PROVENTIL HFA;VENTOLIN HFA) 108 (90 Base) MCG/ACT inhaler Inhale 2 puffs into the lungs every 6 (six) hours as needed for wheezing or shortness of breath.   . alfuzosin (UROXATRAL) 10 MG 24 hr tablet Take 1 tablet (10 mg total) by mouth daily with breakfast.   . apixaban (ELIQUIS) 5 MG TABS tablet Take 1 tablet (5 mg total) by mouth 2 (two) times daily.   Marland Kitchen atorvastatin (LIPITOR) 20 MG tablet Take 20 mg by mouth daily. Reported on 02/04/2016 02/04/2016: Has on hand; has not been taken; put in pill box today  . BREO ELLIPTA 200-25 MCG/INH AEPB Inhale 1 puff into the lungs daily.  01/20/2016: Received from: External Pharmacy  . digoxin (LANOXIN) 0.125 MG tablet Take 1 tablet (0.125 mg total) by mouth daily.   . furosemide (LASIX) 40 MG tablet Take 1 tablet (40 mg  total) by mouth 2 (two) times daily. Reported on 01/20/2016   . HYDROcodone-acetaminophen (NORCO/VICODIN) 5-325 MG per tablet Take 1 tablet every 6 hours or take 2 tablets every 6 hours as needed for severe pain 02/04/2016: Not taking; prefers Naproxen 500mg ; has Naproxen 500mg  on hand prescribed for BID  . metoprolol succinate (TOPROL XL) 25 MG 24 hr tablet Take 1 tablet (25 mg total) by mouth 2 (two) times daily after a meal.   .  potassium chloride SA (KLOR-CON M20) 20 MEQ tablet Take 1 tablet (20 mEq total) by mouth daily.   Marland Kitchen SPIRIVA HANDIHALER 18 MCG inhalation capsule  01/20/2016: Received from: External Pharmacy   Assessment:  70 year old gentleman living in [place], [place] with his wife and 2 dogs and 1 cat in his home. Ricardo Garrett was recently discharged from the hospital after admission for Anasarca/CHF/COPD Exacerbation. Ricardo Garrett has very limited knowledge of his disease processes, medications, and plan of care.    Acute/Chronic Health condition (hypotension) - Ricardo Garrett's blood pressure was found to be low in the 80's/60's range last week on my home visit; he was asymptomatic but on a new dose of Lopressor; I contacted Dr. Kyla Balzarine office and Ricardo Garrett's Lopressor dose was decreased to 25mg  BID; Ricardo Garrett has been checking and recording his bp and pulse daily  Chronic Health Condition (CHF) - Ricardo Garrett has been taking Lasix 40mg  BID as prescribed; his weight was back up 4 pounds within a week of hospital discharge (when he visited the cardiology office last week); he has a trace of edema in both ankles, no abdominal swelling, breath sounds are clear although diminished in the bases, and no JVD is noted; Ricardo Garrett is using O2 @ 2l/[place] but not continuously  Medication Non-Adherence and Management needs - Ricardo Garrett has not been taking all medications as prescribed since his discharge from the hospital. He and his wife reported by phone that he was taking all medications but upon examination of bottles and meds and with interview, Ricardo Garrett admits to non-adherence. Lennette Bihari [facility] pharmacist is visiting today. Please see his detailed note.   Plan:   Ricardo Garrett will check his bp and pulse and weight and record daily, calling for findings outside established parameters (weight gain of 3# overnight or 5# in a week).   Ricardo Garrett will call for new or worsened symptoms.   I will see Ricardo Garrett at  home next week for follow up visit.   THN CM Care Plan Problem One        Most Recent Value   Care Plan Problem One  Knowledge Deficit related to disease processes and prescribed plan of care   Role Documenting the Problem One  Care Management Coordinator   Care Plan for Problem One  Active   THN Long Term Goal (31-90 days)  Over the next 60 days, patient and spouse/caregiver will verbalize understanding of plan of care for management of major disease processes   THN Long Term Goal Start Date  02/02/16   Interventions for Problem One Long Term Goal  Utilizing teachback method, reviewed plans for reason for home visit and THN CM intervention   THN CM Short Term Goal #1 (0-30 days)  Over the next 30 days, patient/spouse will review literature provided (Emmi) and make note of or call with any questions   THN CM Short Term Goal #1 Start Date  02/02/16   Interventions for Short Term Goal #1  notified  patient/spouse that I will provide  and review educational materials related to patient disease processes during our scheduled visit   THN CM Short Term Goal #2 (0-30 days)  Over the next 30 days, patient and/or spouse will verbalize understanding of symptoms that warrant a call to  provider   [facility] CM Short Term Goal #2 Start Date  02/02/16   Interventions for Short Term Goal #2  planned for review with patient/spouse on home visit in 3 days   THN CM Short Term Goal #3 (0-30 days)  Over the next 30 days, patient/spouse will begin self monitoring and documentation    THN CM Short Term Goal #3 Start Date  02/02/16   Interventions for Short Tern Goal #3  Notified patient/spouse that I will provide needed equipment and documentation materials beginning 02/04/16    [facility] CM Care Plan Problem Two        Most Recent Value   Care Plan Problem Two  Urology follow up needs   Role Documenting the Problem Two  Care Management Coordinator   Care Plan for Problem Two  Active   Interventions for Problem Two Long Term  Goal   Discussed the importance of knoweldge of plan of care for management of urological concerns   THN Long Term Goal (31-90) days  Over the next 31 days, patient will verbalize understanding of plan of care for urology needs   [facility] Long Term Goal Start Date  02/04/16   THN CM Short Term Goal #1 (0-30 days)  Over the next 7 days, patient will attend scheduled urology appointment   [facility] CM Short Term Goal #1 Start Date  02/04/16   Interventions for Short Term Goal #2   confirmed upcoming urology appointment,  reinforced importance of attendance to urology appointment    [facility] CM Care Plan Problem Three        Most Recent Value   Care Plan Problem Three  Medication Non Adherence adn Knowledge Deficit   Role Documenting the Problem Three  Care Management Coordinator   Care Plan for Problem Three  Active   THN Long Term Goal (31-90) days  Over the next 60 days, patient will verbalize basic understanding of prescribed medications and will take medications daily   THN Long Term Goal Start Date  02/04/16   Interventions for Problem Three Long Term Goal  Thorough medication review,  filled pill boxes,  contacted provider for concerns about bp management   THN CM Short Term Goal #1 (0-30 days)  Over the next 30 days, patient will attend scheduled provider appointments and will discuss medications with provider   [facility] CM Short Term Goal #1 Start Date  02/04/16   Interventions for Short Term Goal #1  confirmed upcoming provider appointments, reviewed with patient and spouse, wrote on calendar   [facility] CM Short Term Goal #2 (0-30 days)  Over the next 30 days, patient will take medications as prescribed from pre-filled medication boxes   THN CM Short Term Goal #2 Start Date  02/04/16   Interventions for Short Term Goal #2  Med boxes filled,  Utilizing teachback method, reviewed importance of adherence to prescribed medication regimen      [place] [facility]  [phone]

## 2016-02-11 NOTE — Patient Outreach (Signed)
[facility]) Care Management  [place]   02/11/2016  Ricardo Garrett Nov 12, 1945 [id number]  Subjective:  Ricardo Garrett is a 70 year old male who was referred to [facility] by Delrae Rend [facility] RN community for medication education and review.  Patient has a PMH which includes COPD, CKD, hyperlipidemia, atrial fibrillation.    Patient was discharged from [facility] 01/26/16.    This was the initial pharmacist home visit with patient and visit was conducted in collaboration with  Delrae Rend, [facility] RN.    Patient had his spouse answer most of the medication questions.  Patient denies any issues affording medication, verified insurance coverage and patient appears to have extra help for his prescription drug costs.   Patient is using a 7-day pill planer for his medications which his wife reports she is filling.  There were no extra doses in the pill planner to indicate missed doses.  Patent demonstrated appropriate use of his Breo and Spiriva inhaler.     Objective:   Encounter Medications: Outpatient Encounter Prescriptions as of 02/11/2016  Medication Sig Note  . albuterol (PROVENTIL HFA;VENTOLIN HFA) 108 (90 Base) MCG/ACT inhaler Inhale 2 puffs into the lungs every 6 (six) hours as needed for wheezing or shortness of breath.   . alfuzosin (UROXATRAL) 10 MG 24 hr tablet Take 1 tablet (10 mg total) by mouth daily with breakfast.   . apixaban (ELIQUIS) 5 MG TABS tablet Take 1 tablet (5 mg total) by mouth 2 (two) times daily.   Marland Kitchen atorvastatin (LIPITOR) 20 MG tablet Take 20 mg by mouth daily. Reported on 02/04/2016 02/04/2016: Has on hand; has not been taken; put in pill box today  . BREO ELLIPTA 200-25 MCG/INH AEPB Inhale 1 puff into the lungs daily.  01/20/2016: Received from: External Pharmacy  . digoxin (LANOXIN) 0.125 MG tablet Take 1 tablet (0.125 mg total) by mouth daily.   . furosemide (LASIX) 40 MG tablet Take 1 tablet (40 mg total) by mouth 2 (two) times daily. Reported on  01/20/2016   . metoprolol succinate (TOPROL XL) 25 MG 24 hr tablet Take 1 tablet (25 mg total) by mouth 2 (two) times daily after a meal.   . potassium chloride SA (KLOR-CON M20) 20 MEQ tablet Take 1 tablet (20 mEq total) by mouth daily.   Marland Kitchen SPIRIVA HANDIHALER 18 MCG inhalation capsule  01/20/2016: Received from: External Pharmacy  . acetaminophen (TYLENOL) 325 MG tablet Take 2 tablets (650 mg total) by mouth every 6 (six) hours as needed (mild pain). (Patient not taking: Reported on 02/11/2016)   . HYDROcodone-acetaminophen (NORCO/VICODIN) 5-325 MG per tablet Take 1 tablet every 6 hours or take 2 tablets every 6 hours as needed for severe pain (Patient not taking: Reported on 02/11/2016) 02/04/2016: Not taking; prefers Naproxen 500mg ; has Naproxen 500mg  on hand prescribed for BID   No facility-administered encounter medications on file as of 02/11/2016.    Functional Status: In your present state of health, do you have any difficulty performing the following activities: 01/20/2016  Hearing? Y  Vision? N  Difficulty concentrating or making decisions? N  Walking or climbing stairs? N  Dressing or bathing? N  Doing errands, shopping? N    Fall/Depression Screening: No flowsheet data found.  Assessment:  1) Patient demonstrated appropriate use of his inhalers and was counseled to rinse his mouth with water after each use of Breo.   2) Patient's pill planner had 1.5 tablets of metoprolol succinate twice daily in it.  Per  02/04/16 telephone encounter and 02/09/16 cardiology office note, metoprolol succinate dose was decreased to 25 mg (1 tablet) twice daily due to hypotension.   3) Patient has no missed doses in his pill planner---he and his spouse state that spouse is filling pill planner.    4) Patient asked about using naproxen periodically for knee pain---he has a prescription for 500 mg twice daily.  Counseled patient to use as little as possible given increase risk of bleeding with apixaban and  NSAIDs can harm kidneys and to take with food.  He was counseled to contact his PCP if he is using naproxen on a daily or routine basis which he denies at this time.      Plan:  1) Counseled patient to rinse mouth out with water after each use of Breo to reduce risk of thrush.    2) Patient's pill box was reviewed and the extra metoprolol succinate was removed per the correct dosage from cardiology note 02/09/16.   3) [name] filled pill planner and pharmacist reviewed it for accuracy, and it was filled correctly.    4) Will send initial assessment to patient's PCP and barriers letter to cardiology to alert them of metoprolol succinate dosing.    5) Pharmacist will place a follow-up phone call to patient in about 1 week to follow-up on adherence.   Patient reports to pharmacist that his spouse should be spoken to regarding medications at future phone calls.    Karrie Meres, PharmD, [place] [phone]

## 2016-02-15 LAB — CBC
Baso % Manual: 1
Basophils Absolute: 87 /uL
EOS%: 4 %
Eosinophils Absolute: 348 /uL
HCT: 48 %
Hemoglobin: 15.7
Lymphocyte %: 17
Lymphocytes Absolute: 1479 /uL
MCH: 29.2
MCHC: 32.7
MCV: 89.2
MONO%: 10 %
MPV: 9.8 fL (ref 7.5–11.5)
Monos Abs: 870
Neutrophil %: 68
Neutrophils Absolute: 5916 /uL
RBC: 5.38
RDW: 15.1
WBC: 8.7
platelet count: 505

## 2016-02-15 LAB — BASIC METABOLIC PANEL WITH GFR
ALT: 41
AST: 27 U/L
Albumin: 3.2
Alkaline Phosphatase: 155 U/L
BUN: 34
Calcium: 9 mg/dL
Chloride: 97 mmol/L
Co2: 26
Creatine, Serum: 1.95
Glucose: 104
Potassium: 5.6 mmol/L
Sodium: 136
Total Bilirubin: 0.4 mg/dL
Total Protein: 5.8 g/dL

## 2016-02-15 LAB — LIPID PANEL
CHOL/HDL RATIO: 6.7
Cholesterol: 193
HDL: 29
LDL CHOLESTEROL, CALC: 125
Triglyceride fasting, serum: 196
VLDL CHOLESTEROL, CALC: 39

## 2016-02-15 LAB — HEMOGLOBIN A1C: HEMOGLOBIN A1C: 6.2

## 2016-02-17 ENCOUNTER — Encounter: Payer: Self-pay | Admitting: *Deleted

## 2016-02-17 ENCOUNTER — Other Ambulatory Visit: Payer: Self-pay | Admitting: *Deleted

## 2016-02-17 NOTE — Patient Outreach (Signed)
[facility] [facility]) Care Management   02/17/2016  Muscab Goodlow Mar 04, 1946 [id number]  Ainsley Beaverson is an 70 y.o. male with COPD, pulmonary nodule, and CKD stage 3. He presented to his cardiology office on 01/18/16 with complaints of anasarca and was found to have atelectasis and consolidation on chest xray and afib with RVR. He was consequently sent to [facility] for direct admission and treated for COPD exacerbation, afib with RVR, and urinary retention., Mr. Tolhurst also experienced confusion during his hospitalization which was thought to be related to medication side effect.   [facility] Care Management was consulted while in hospital and transition of care services were initiated 2 weeks ago. Mr. Hendershot was found to be hypotensive at home on my initial visit with him and contact with cardiology was made wherein adjustments to his Lopressor dose was undertaken. Mr. Orms was provided with a digital scale, an electronic blood pressure monitor, and a blue [facility] Care Management notebook with bp, pulse, and weight documentation logs. In depth education was provided for CHF and HTN disease management. Thorough medication review with patient and spouse was completed.   Wilburn Mylar, Mr. Guevarra saw Cameron Ali DNP at Dr. Juel Burrow office and she further decreased his Furosemide 40mg  to once daily and his Lopressor 25mg  to once daily.   Today, I am seeing Mr. Manton at home for continued follow up.    Subjective: "I guess I'm feeling a little better."  Objective:  BP 104/72 mmHg  Pulse 81  Wt 142 lb 6 oz (64.581 kg)  SpO2 98%  Review of Systems  Constitutional: Negative.   HENT: Negative.   Eyes: Negative.   Respiratory: Negative.   Cardiovascular: Negative.   Gastrointestinal: Negative.   Genitourinary: Negative.   Musculoskeletal: Negative.   Skin: Negative.   Neurological: Negative.   Psychiatric/Behavioral: Negative.     Physical Exam  Constitutional: He is  oriented to person, place, and time. Vital signs are normal. He appears well-developed and well-nourished. He is active.  Cardiovascular: Normal rate and regular rhythm.   Respiratory: Effort normal and breath sounds normal.  GI: Soft. Bowel sounds are normal.  Neurological: He is alert and oriented to person, place, and time.  Skin: Skin is warm and dry.  Psychiatric: He has a normal mood and affect. His speech is normal and behavior is normal. Judgment and thought content normal. Cognition and memory are normal.    Encounter Medications:   Outpatient Encounter Prescriptions as of 02/17/2016  Medication Sig Note  . acetaminophen (TYLENOL) 325 MG tablet Take 2 tablets (650 mg total) by mouth every 6 (six) hours as needed (mild pain). (Patient not taking: Reported on 02/11/2016)   . albuterol (PROVENTIL HFA;VENTOLIN HFA) 108 (90 Base) MCG/ACT inhaler Inhale 2 puffs into the lungs every 6 (six) hours as needed for wheezing or shortness of breath.   . alfuzosin (UROXATRAL) 10 MG 24 hr tablet Take 1 tablet (10 mg total) by mouth daily with breakfast.   . apixaban (ELIQUIS) 5 MG TABS tablet Take 1 tablet (5 mg total) by mouth 2 (two) times daily.   Marland Kitchen atorvastatin (LIPITOR) 20 MG tablet Take 20 mg by mouth daily. Reported on 02/04/2016 02/04/2016: Has on hand; has not been taken; put in pill box today  . BREO ELLIPTA 200-25 MCG/INH AEPB Inhale 1 puff into the lungs daily.  01/20/2016: Received from: External Pharmacy  . digoxin (LANOXIN) 0.125 MG tablet Take 1 tablet (0.125 mg total) by mouth daily.   Marland Kitchen  furosemide (LASIX) 40 MG tablet Take 1 tablet (40 mg total) by mouth 2 (two) times daily. Reported on 01/20/2016   . HYDROcodone-acetaminophen (NORCO/VICODIN) 5-325 MG per tablet Take 1 tablet every 6 hours or take 2 tablets every 6 hours as needed for severe pain (Patient not taking: Reported on 02/11/2016) 02/04/2016: Not taking; prefers Naproxen 500mg ; has Naproxen 500mg  on hand prescribed for BID  .  metoprolol succinate (TOPROL XL) 25 MG 24 hr tablet Take 1 tablet (25 mg total) by mouth 2 (two) times daily after a meal.   . potassium chloride SA (KLOR-CON M20) 20 MEQ tablet Take 1 tablet (20 mEq total) by mouth daily.   Marland Kitchen SPIRIVA HANDIHALER 18 MCG inhalation capsule  01/20/2016: Received from: External Pharmacy   Assessment:  70 year old gentleman living in [place], [place] with his wife and 2 dogs and 1 cat in his home. Mr. Seemann was recently discharged from the hospital after admission for Anasarca/CHF/COPD Exacerbation. Mr. Marett has very limited knowledge of his disease processes, medications, and plan of care.    Acute/Chronic Health condition (hypotension) - Mr. Schiele's blood pressure was found to be low in the 80's/60's range right after discharge; his lopressor and lasix doses have been decreased to once daily; Mr. Hodo has been checking and recording his bp and pulse daily  Chronic Health Condition (CHF) - Mr. Rocks has been taking Lasix 40mg  BID as prescribed but his dose was decreased to once daily yesterday at Dr. Juel Burrow office; his weight was back up 4 pounds within a week of hospital discharge (when he visited the cardiology office last week); he has no edema today, no abdominal swelling, breath sounds are clear; no JVD is noted; Mr. Shrock is using O2 @ 2l/[place] but not continuously  Medication Non-Adherence and Management needs - Mr. Ciolli had not been taking all medications as prescribed since his discharge from the hospital. [place] Management Pharmacist Karrie Meres and I have worked closely with Mr. Wencl and his wife and they are now using a pill box and showing improvement in understanding and adherence.    Plan:   Mr. Eliassen will check his bp and pulse and weight and record daily, calling for findings outside established parameters (weight gain of 3# overnight or 5# in a week).   Mr. Konecny will call for new or worsened symptoms.   I will see Mr. Paci  at home next week for follow up visit.     [place] Management  [phone]

## 2016-02-18 ENCOUNTER — Other Ambulatory Visit: Payer: Self-pay | Admitting: Pharmacist

## 2016-02-18 NOTE — Patient Outreach (Signed)
[facility]) Care Management  [place]   02/18/2016  Ricardo Garrett 03/08/1946 [id number]  Subjective:   Ricardo Garrett Pharmacist made follow-up call to patient and his spouse.  Patient verified name and date of birth and gave permission to speak to his spouse about his medications.   They report PCP office decreased furosemide and metoprolol succinate to once daily. Patient's spouse reports that she was able to take out the extra dose of each of these medications from patient's pill planner.    Patient's spouse denies issues filling patient's pill planner and reports that she crossed out the twice daily direction on prescription bottles for furosemide and metoprolol succinate and wrote once daily to help her remember when filling patient's pill box.   Patient reports that he is using his Spiriva and Breo once daily, and encouraged patient to continue taking his inhalers as prescribed.  He denies feeling dizzy or lightheaded while his blood pressure medications are being adjusted by his medical providers.  He denies any access issues to affording his prescriptions.   Objective:   Current Medications: Current Outpatient Prescriptions  Medication Sig Dispense Refill  . albuterol (PROVENTIL HFA;VENTOLIN HFA) 108 (90 Base) MCG/ACT inhaler Inhale 2 puffs into the lungs every 6 (six) hours as needed for wheezing or shortness of breath.    . alfuzosin (UROXATRAL) 10 MG 24 hr tablet Take 1 tablet (10 mg total) by mouth daily with breakfast. 30 tablet 1  . apixaban (ELIQUIS) 5 MG TABS tablet Take 1 tablet (5 mg total) by mouth 2 (two) times daily. 60 tablet 1  . atorvastatin (LIPITOR) 20 MG tablet Take 20 mg by mouth daily. Reported on 02/04/2016    . BREO ELLIPTA 200-25 MCG/INH AEPB Inhale 1 puff into the lungs daily.     . digoxin (LANOXIN) 0.125 MG tablet Take 1 tablet (0.125 mg total) by mouth daily. 30 tablet 1  . furosemide (LASIX) 40 MG tablet Take 1 tablet (40 mg total) by  mouth 2 (two) times daily. Reported on 01/20/2016 60 tablet 1  . metoprolol succinate (TOPROL XL) 25 MG 24 hr tablet Take 1 tablet (25 mg total) by mouth 2 (two) times daily after a meal. 180 tablet 1  . potassium chloride SA (KLOR-CON M20) 20 MEQ tablet Take 1 tablet (20 mEq total) by mouth daily. 30 tablet 6  . SPIRIVA HANDIHALER 18 MCG inhalation capsule     . acetaminophen (TYLENOL) 325 MG tablet Take 2 tablets (650 mg total) by mouth every 6 (six) hours as needed (mild pain). (Patient not taking: Reported on 02/18/2016)    . HYDROcodone-acetaminophen (NORCO/VICODIN) 5-325 MG per tablet Take 1 tablet every 6 hours or take 2 tablets every 6 hours as needed for severe pain (Patient not taking: Reported on 02/11/2016) 240 tablet 0   No current facility-administered medications for this visit.    Functional Status: In your present state of health, do you have any difficulty performing the following activities: 02/17/2016 01/20/2016  Hearing? N Y  Vision? N N  Difficulty concentrating or making decisions? N N  Walking or climbing stairs? N N  Dressing or bathing? N N  Doing errands, shopping? N N  Preparing Food and eating ? N -  Using the Toilet? N -  In the past six months, have you accidently leaked urine? N -  Do you have problems with loss of bowel control? N -  Managing your Medications? N -  Managing your Finances? N -  Housekeeping or managing your Housekeeping? Y -    Fall/Depression Screening: PHQ 2/9 Scores 02/17/2016  PHQ - 2 Score 0    Assessment:  Patient and spouse report no difficulties filling pill planner for the past week.  [facility] RN Care Manager, Delrae Rend is also involved in patient's case completing home visits.  Patient has appointment scheduled with Dr Johnsie Cancel, cardiology, 02/24/16.   Patient had labs on 02/12/16 which showed a creatinine of 1.95 and potassium of 5.6.    Plan:  1) [facility] Pharmacist scheduled follow-up phone call to patient and spouse in about 2 weeks.   Advised that if any medication questions/concerns arise before then they are welcome to contact pharmacist as necessary.    2) Will send barriers to letter to Dr Johnsie Cancel.  Patient has appointment scheduled with Dr Johnsie Cancel on 02/24/16 and given increased renal function and potassium, would suggest repeat BMP.    Karrie Meres, PharmD, [place] [phone]

## 2016-02-19 ENCOUNTER — Encounter: Payer: Self-pay | Admitting: Pharmacist

## 2016-02-22 NOTE — Progress Notes (Signed)
Patient ID: Ricardo Garrett, male   DOB: 20-Jan-1946, 70 y.o.   MRN: [id number]     Cardiology Office Note   Date:  02/24/2016   ID:  Ricardo Garrett, DOB 28-Mar-1946, MRN [id number]  PCP:  Wende Neighbors, MD  Cardiologist:   [name], MD   No chief complaint on file.     History of Present Illness: Ricardo Garrett is a 70 y.o. male who presents  Post hospitalization for CHF/Anasarca and rapid afib Previous smoker Has had LE edema on diuretic.  Notes from Dr hall reviewed.  CRF Cr 1.5  Indicated low albumin  He had been going down hill for a few weeks. Does not see doctors often.  Increasing dyspnea and edema including scrotal swelling.  He quit smoking 25 yrs ago.  Has not had f/u for an  abnormal CT in January Started on diuretic by Dr Nevada Crane but stopped taking cause it wasn't working . Admitted 01/20/16 for further iv diuresis and control of afib.    Echo 01/21/16 Study Conclusions  - Left ventricle: The cavity size was mildly dilated. Wall  thickness was increased in a pattern of moderate LVH. Systolic  function was moderately reduced. The estimated ejection fraction  was in the range of 35% to 40%. Diffuse hypokinesis. - Mitral valve: There was mild regurgitation. - Left atrium: The atrium was mildly dilated. - Right ventricle: The cavity size was mildly dilated. - Right atrium: The atrium was mildly dilated. - Atrial septum: No defect or patent foramen ovale was identified. - Pulmonary arteries: PA peak pressure: 47 mm Hg (S).  In hospital had right thoracentesis.  Diuresed.  Rate control with beta blocker. Urinary retention and was d/c with foley catheter Started on Eliquis  01/11/16  Carotid duplex with 29-79% LICA stenosis reviewed  01/11/16 Renal duplex unremarkable  12/30/15  PFTls done by Dr Luan Pulling showed moderate COPD with severe decrease in DLCO and restictive lung component  11/23/15 Abnormal CT    IMPRESSION: Right lower lobe collapse/consolidation with small to moderate  right pleural effusion.  Tiny left pleural effusion.  Bilateral small pulmonary nodules, the larger of which is 5 mm   Since d/c doing better Foley is out.  Breathing better Less edema.  Still has very poor insight into health issues.  Discussed his COPD, nephrotic syndrome and CHF With afib at length. Discussed issues regarding [place].  He has been compliant with his eliquis with no bleeding issues  Wants me to do [facility] at [facility] I am not here again Until June 2  Past Medical History  Diagnosis Date  . High cholesterol   . Broken arm   . COPD (chronic obstructive pulmonary disease) ([place])   . Hypertension   . Oxygen deficiency     Past Surgical History  Procedure Laterality Date  . Tonsillectomy    . Skin graft    . Compression hip screw Right 03/15/2013    Procedure: COMPRESSION HIP;  Surgeon: Sanjuana Kava, MD;  Location: [facility];  Service: Orthopedics;  Laterality: Right;     Current Outpatient Prescriptions  Medication Sig Dispense Refill  . albuterol (PROVENTIL HFA;VENTOLIN HFA) 108 (90 Base) MCG/ACT inhaler Inhale 2 puffs into the lungs every 6 (six) hours as needed for wheezing or shortness of breath.    . alfuzosin (UROXATRAL) 10 MG 24 hr tablet Take 1 tablet (10 mg total) by mouth daily with breakfast. 30 tablet 1  . apixaban (ELIQUIS) 5 MG TABS tablet Take 1 tablet (5 mg  total) by mouth 2 (two) times daily. 60 tablet 1  . atorvastatin (LIPITOR) 20 MG tablet Take 20 mg by mouth daily. Reported on 02/04/2016    . BREO ELLIPTA 200-25 MCG/INH AEPB Inhale 1 puff into the lungs daily.     . digoxin (LANOXIN) 0.125 MG tablet Take 1 tablet (0.125 mg total) by mouth daily. 30 tablet 1  . furosemide (LASIX) 40 MG tablet Take 1 tablet (40 mg total) by mouth 2 (two) times daily. Reported on 01/20/2016 60 tablet 1  . metoprolol succinate (TOPROL XL) 25 MG 24 hr tablet Take 1 tablet (25 mg total) by mouth 2 (two) times daily after a meal. 180 tablet 1  . potassium chloride SA (KLOR-CON M20)  20 MEQ tablet Take 1 tablet (20 mEq total) by mouth daily. 30 tablet 6  . SPIRIVA HANDIHALER 18 MCG inhalation capsule     . HYDROcodone-acetaminophen (NORCO/VICODIN) 5-325 MG per tablet Take 1 tablet every 6 hours or take 2 tablets every 6 hours as needed for severe pain (Patient not taking: Reported on 02/11/2016) 240 tablet 0   No current facility-administered medications for this visit.    Allergies:   Review of patient's allergies indicates no known allergies.    Social History:  The patient  reports that he quit smoking about 36 years ago. He does not have any smokeless tobacco history on file. He reports that he drinks alcohol. He reports that he uses illicit drugs (Marijuana).   Family History:  The patient's family history is not on file.    ROS:  Please see the history of present illness.   Otherwise, review of systems are positive for none.   All other systems are reviewed and negative.    PHYSICAL EXAM: VS:  Ht 5\' 8"  (1.727 m)  Wt 69.854 kg (154 lb)  BMI 23.42 kg/m2 , BMI Body mass index is 23.42 kg/(m^2). Affect appropriate Chronically ill white male  HEENT: poor dentition  Neck supple with no adenopathy JVP normal no bruits no thyromegaly Lungs rhonchi exp  Wheezing ? Right effusion  and good diaphragmatic motion Heart:  S1/S2 no murmur, no rub, gallop or click PMI normal Abdomen: benighn, BS positve, edemetous  no bruit.  No HSM or HJR Distal pulses intact with no bruits Plus 1bilateral  edema Neuro non-focal Skin warm and dry No muscular weakness     EKG:  02/2013  SR PAC inferolateral T wave changes  Rapid afib rate 159  Poor R wave progression    Recent Labs: 01/20/2016: TSH 3.998 01/26/2016: Creatinine, Ser 1.21; Hemoglobin 14.9; Platelets 258 02/12/2016: ALT 41; BUN 34; Potassium 5.6; Sodium 136    Lipid Panel    Component Value Date/Time   CHOL 193 02/12/2016   TRIG 196 02/12/2016   HDL 29 02/12/2016   CHOLHDL 6.7 02/12/2016   VLDL 39  02/12/2016   LDLCALC 125 02/12/2016      Wt Readings from Last 3 Encounters:  02/24/16 69.854 kg (154 lb)  02/17/16 64.581 kg (142 lb 6 oz)  02/11/16 65.545 kg (144 lb 8 oz)      Other studies Reviewed: Additional studies/ records that were reviewed today include: Records/ labs Dr 02/13/16 see HPO  [employer], PFTls and ECG Epic  Labs Cr 1.4  Alk P 158  Albumin 2.9 Plus 3 protein in urine Na 138 ESR 7 K 4.3 .    ASSESSMENT AND PLAN:  1.  Afib:  On Rx anticoagulation with eliquis LA mildly dilated  on echo Risks of [facility] and benefit discussed willing to proceed at [facility] 04/01/16  Will get labs 2-3 days Before stressed importance of not missing any eliquis doses  Orders written OR called  2. Pulm:  F/U Dr Luan Pulling [facility] post thoracentesis with minimal right effusion no mention of lobar collapse or cancer Moderate COPD previous smoker  3. Edema:  Compination of CHF and nephrotic syndrome CR baseline around 2 24 hr urine protein very elevated 3858  Refer to renal / primary  Reviewed home BP and weight and stable around 147 on his scales   Current medicines are reviewed at length with the patient today.  The patient does not have concerns regarding medicines.  [name]

## 2016-02-23 ENCOUNTER — Ambulatory Visit (HOSPITAL_COMMUNITY)
Admission: RE | Admit: 2016-02-23 | Discharge: 2016-02-23 | Disposition: A | Discharge status: Home / Self Care | Payer: Commercial Managed Care - HMO | Source: Ambulatory Visit | Attending: Internal Medicine | Admitting: Internal Medicine

## 2016-02-23 ENCOUNTER — Other Ambulatory Visit (HOSPITAL_COMMUNITY): Payer: Self-pay | Admitting: Internal Medicine

## 2016-02-23 DIAGNOSIS — I5030 Unspecified diastolic (congestive) heart failure: Secondary | ICD-10-CM | POA: Diagnosis present

## 2016-02-23 DIAGNOSIS — J189 Pneumonia, unspecified organism: Secondary | ICD-10-CM | POA: Insufficient documentation

## 2016-02-24 ENCOUNTER — Encounter: Payer: Self-pay | Admitting: *Deleted

## 2016-02-24 ENCOUNTER — Encounter: Payer: Self-pay | Admitting: Cardiovascular Disease

## 2016-02-24 ENCOUNTER — Other Ambulatory Visit: Payer: Self-pay | Admitting: Cardiovascular Disease

## 2016-02-24 ENCOUNTER — Ambulatory Visit (INDEPENDENT_AMBULATORY_CARE_PROVIDER_SITE_OTHER): Payer: Commercial Managed Care - HMO | Admitting: Cardiovascular Disease

## 2016-02-24 VITALS — BP 106/62 | HR 61 | Ht 68.0 in | Wt 154.0 lb

## 2016-02-24 DIAGNOSIS — J449 Chronic obstructive pulmonary disease, unspecified: Secondary | ICD-10-CM

## 2016-02-24 DIAGNOSIS — Z01818 Encounter for other preprocedural examination: Secondary | ICD-10-CM | POA: Diagnosis not present

## 2016-02-24 DIAGNOSIS — N049 Nephrotic syndrome with unspecified morphologic changes: Secondary | ICD-10-CM | POA: Diagnosis not present

## 2016-02-24 NOTE — Patient Instructions (Signed)
Your physician has recommended that you have a Cardioversion (DCCV). Electrical Cardioversion uses a jolt of electricity to your heart either through paddles or wired patches attached to your chest. This is a controlled, usually prescheduled, procedure. Defibrillation is done under light anesthesia in the hospital, and you usually go home the day of the procedure. This is done to get your heart back into a normal rhythm. You are not awake for the procedure. Please see the instruction sheet given to you today.  Your physician recommends that you return for lab work in: (03/30/16)  You have been referred to Dr. Luan Pulling for COPD  You have been referred to Dr. Lowanda Foster  If you need a refill on your cardiac medications before your next appointment, please call your pharmacy.  Thank you for choosing [facility]!

## 2016-02-25 ENCOUNTER — Other Ambulatory Visit: Payer: Self-pay | Admitting: *Deleted

## 2016-02-25 NOTE — Patient Outreach (Signed)
[facility]) Care Management   02/25/2016  Ricardo Garrett 1946-03-14 [id number]  Ricardo Garrett is an 70 y.o. male with COPD, pulmonary nodule, and CKD stage 3. He presented to his cardiology office on 01/18/16 with anasarca, atelectasis and consolidation on chest xray, and afib with RVR. He was consequently sent to [facility] for direct admission and treated for COPD exacerbation, afib with RVR, and urinary retention., Ricardo Garrett also experienced confusion during his hospitalization which was thought to be related to medication side effect.   [facility] Care Management was consulted and on my initial home visit, Ricardo Garrett blood pressure was found to be 70's/40's and Ricardo Garrett complained of significant fatigue. I reached out to the cardiology office and his Lopressor dose was decreased by 1/2 (25mg  BID).   On my initial visit, Ricardo Garrett was provided with a digital scale, an electronic blood pressure monitor, and a blue [facility] Care Management notebook with bp, pulse, and weight documentation logs. In depth education was provided for CHF and HTN disease management. Thorough medication review with patient and spouse was completed.   Ricardo Garrett saw Ricardo Ali DNP at Dr. Juel Burrow office 2 weeks ago and his Furosemide 40mg  dose was decreased to once daily and his Lopressor 25mg  to once daily.   Today, I am seeing Ricardo Garrett at home for continued follow up.   Subjective: "I feel whole lot better off some of this medicine but my leg really hurts since I haven't been taking that medicine (Naprosyn)"  Objective:  BP 106/64 mmHg  Pulse 82  Wt 147 lb (66.679 kg)  SpO2 95%  Review of Systems  Constitutional: Negative.   HENT: Negative.   Eyes: Negative.   Respiratory: Negative.   Cardiovascular: Negative for chest pain and leg swelling.  Gastrointestinal: Negative.   Genitourinary: Negative.   Musculoskeletal: Positive for myalgias and joint pain. Negative for falls.        Complaints of worsening right leg pain (patient has history of hip fracture) off Naproxen he was taking chronically prior to initiation of Eliquis  Skin: Negative.   Neurological: Negative.   Psychiatric/Behavioral: Negative.     Physical Exam  Constitutional: He is oriented to person, place, and time. He appears well-developed and well-nourished. He is active.  Cardiovascular: Normal rate.  An irregular rhythm present.  Respiratory: Effort normal and breath sounds normal. He has no wheezes. He has no rhonchi. He has no rales.  GI: Soft. Bowel sounds are normal.  Neurological: He is alert and oriented to person, place, and time.  Skin: Skin is warm and dry.  Psychiatric: He has a normal mood and affect. His speech is normal and behavior is normal. Judgment and thought content normal. Cognition and memory are normal.    Encounter Medications:   Outpatient Encounter Prescriptions as of 02/25/2016  Medication Sig Note  . albuterol (PROVENTIL HFA;VENTOLIN HFA) 108 (90 Base) MCG/ACT inhaler Inhale 2 puffs into the lungs every 6 (six) hours as needed for wheezing or shortness of breath.   . alfuzosin (UROXATRAL) 10 MG 24 hr tablet Take 1 tablet (10 mg total) by mouth daily with breakfast.   . apixaban (ELIQUIS) 5 MG TABS tablet Take 1 tablet (5 mg total) by mouth 2 (two) times daily.   Marland Kitchen atorvastatin (LIPITOR) 20 MG tablet Take 20 mg by mouth daily. Reported on 02/04/2016 02/04/2016: Has on hand; has not been taken; put in pill box today  . BREO ELLIPTA 200-25 MCG/INH AEPB Inhale  1 puff into the lungs daily.  01/20/2016: Received from: External Pharmacy  . digoxin (LANOXIN) 0.125 MG tablet Take 1 tablet (0.125 mg total) by mouth daily.   . furosemide (LASIX) 40 MG tablet Take 1 tablet (40 mg total) by mouth 2 (two) times daily. Reported on 01/20/2016 02/17/2016: Taking differently; per order Dr Wende Neighbors, decreased dose to once daily on 02/16/16  . HYDROcodone-acetaminophen (NORCO/VICODIN) 5-325 MG  per tablet Take 1 tablet every 6 hours or take 2 tablets every 6 hours as needed for severe pain (Patient not taking: Reported on 02/11/2016) 02/04/2016: Not taking; prefers Naproxen 500mg ; has Naproxen 500mg  on hand prescribed for BID  . metoprolol succinate (TOPROL XL) 25 MG 24 hr tablet Take 1 tablet (25 mg total) by mouth 2 (two) times daily after a meal. 02/17/2016: Taking differently; per order Dr Wende Neighbors on 02/16/16, taking once daily  . potassium chloride SA (KLOR-CON M20) 20 MEQ tablet Take 1 tablet (20 mEq total) by mouth daily.   Marland Kitchen SPIRIVA HANDIHALER 18 MCG inhalation capsule  01/20/2016: Received from: External Pharmacy    Assessment:  70 year old gentleman living in [place], [place] with his wife and 2 dogs and 1 cat in his home. Ricardo Garrett was recently discharged from the hospital after admission for Anasarca/CHF/COPD Exacerbation.     Acute/Chronic Health Condition (Afib) - Ricardo Garrett is scheduled for DCCV at [facility] [place] on 04/02/16. We reviewed the materials provided him by the office when he was there for his appointment. I have scheduled a home visit with Ricardo Garrett the week prior to his planned procedure.   Pain Management concerns - Ricardo Garrett has history of intertrochanteric fracture of right hip for which he has been taking Naprosyn daily until he was started on Eliquis; Ricardo Garrett is complaining today of worsening right hip and leg pain and a limp is noticeable since our last visit; he has not had falls but has discontinued the Naprosyn as instructed; I reached out to Dr. Nevada Crane who advised that Ricardo Garrett could take Extra Strength Tylenol ii every 8 hours; I provided instruction on this addition to Ricardo Garrett.   Acute/Chronic Health condition (hypotension) - Ricardo Garrett's blood pressure was found to be low in the 80's/60's range right after discharge; his lopressor and lasix doses have been decreased to once daily; Ricardo Garrett has been checking and recording his bp and  pulse daily. his color is improved today and he is clearly more energetic  See home monitoring findings below:  02/11/16   BP 109/70 Pulse 64    02/12/16   BP   86/62 Pulse 60 02/14/16   BP   76/43 Pulse 72  02/15/16   BP   93/62 Pulse 85  02/16/16   BP   78/48 Pulse 74  02/17/15   BP   80/51 Pulse 79   02/18/16   BP   97/59 Pulse 89   02/19/16   BP 130/85 Pulse 60  02/20/16   BP   99/71 Pulse 93    02/21/16   BP 101/66 Pulse 81  02/22/16   BP 114/69 Pulse 64   02/23/16   BP 103/66 Pulse 64  02/23/16   BP 114/84 Pulse 96 02/25/16   BP   78/62 Pulse 93     Chronic Health Condition (CHF) - Mr. Hoefling is taking Lasix 40mg  QD as prescribed; his weight was 147 today; he has no edema today, no abdominal swelling, breath sounds are  clear; no JVD is noted; Mr. Minge is using O2 @ 2l/[place] but not continuously  Home Monitoring Results:  02/11/16 - 145.2 02/12/16 - 144.8 02/14/16 - 144.2 02/15/16 - 143.2 02/16/16 - 142.4 02/17/16 - 142.6 02/18/16 - 143.8 02/19/16 - 145.4 02/20/16 - 146.8 02/21/16 - 148.8 02/22/16 - 147.4 02/23/16 - 147.4 02/23/16 - 147.4 02/25/16 - 147.0   Medication Non-Adherence and Management needs - Mr. Langworthy's understanding of and adherence to his medication regimen has improved drastically. [facility] Care Management Pharmacist Karrie Meres and I have worked closely with Mr. Poch and his wife and they are now using a pill box to avoid confusion about daily administration of medications.    Plan:   Mr. Avitabile will check his bp and pulse and weight and record daily, calling for findings outside established parameters (weight gain of 3# overnight or 5# in a week).   Mr. Housden will call for new or worsened symptoms.    [place] Management  [phone]

## 2016-02-26 ENCOUNTER — Encounter: Payer: Self-pay | Admitting: *Deleted

## 2016-03-03 ENCOUNTER — Telehealth: Payer: Self-pay

## 2016-03-03 ENCOUNTER — Other Ambulatory Visit: Payer: Self-pay | Admitting: Pharmacist

## 2016-03-03 NOTE — Patient Outreach (Signed)
[facility]) Care Management  [place]   03/03/2016  Ricardo Garrett 08-03-1946 [id number]  Subjective:  [facility] Pharmacist made a follow-up home visit to patient's residence today to review medication adherence and pill planner.  Patient's spouse, was present at patient's request.   Patient reports his spouse has been filling his pill planner 7 days at a time and patient's spouse reports she feels comfortable filling it, but that it gets confusing when medication doses change.  Patient and spouse report that metoprolol succinate was decreased from 25 mg daily to 12.5 mg daily by PCP, Dr Nevada Crane.  They deny any missed doses of patient's oral medications.    Patient does state that he is only using his inhalers as needed and that he feels his breathing is fine.    They state that some bills from office visits and hospital are coming and it is a stressor for them.  Patient reports his medications are affordable as he reports he has [employer] Extra Help.    Patient does report that he had picked up some pepperoni from the store and does use salt on occasion.  He reports he weighs himself daily and that he or his spouse record weight daily.   Objective:   Encounter Medications: Outpatient Encounter Prescriptions as of 03/03/2016  Medication Sig Note  . albuterol (PROVENTIL HFA;VENTOLIN HFA) 108 (90 Base) MCG/ACT inhaler Inhale 2 puffs into the lungs every 6 (six) hours as needed for wheezing or shortness of breath.   . alfuzosin (UROXATRAL) 10 MG 24 hr tablet Take 1 tablet (10 mg total) by mouth daily with breakfast.   . apixaban (ELIQUIS) 5 MG TABS tablet Take 1 tablet (5 mg total) by mouth 2 (two) times daily.   Marland Kitchen atorvastatin (LIPITOR) 20 MG tablet Take 20 mg by mouth daily. Reported on 02/04/2016 02/04/2016: Has on hand; has not been taken; put in pill box today  . digoxin (LANOXIN) 0.125 MG tablet Take 1 tablet (0.125 mg total) by mouth daily.   . furosemide (LASIX) 40 MG tablet  Take 1 tablet (40 mg total) by mouth 2 (two) times daily. Reported on 01/20/2016 02/17/2016: Taking differently; per order Dr Wende Neighbors, decreased dose to once daily on 02/16/16  . metoprolol succinate (TOPROL XL) 25 MG 24 hr tablet Take 1 tablet (25 mg total) by mouth 2 (two) times daily after a meal. 03/03/2016: Patient reports decreased to 12.5 mg (1/2 tablet) once daily by Dr Nevada Crane.    . potassium chloride SA (KLOR-CON M20) 20 MEQ tablet Take 1 tablet (20 mEq total) by mouth daily.   Marland Kitchen BREO ELLIPTA 200-25 MCG/INH AEPB Inhale 1 puff into the lungs daily. Reported on 03/03/2016 01/20/2016: Received from: External Pharmacy  . HYDROcodone-acetaminophen (NORCO/VICODIN) 5-325 MG per tablet Take 1 tablet every 6 hours or take 2 tablets every 6 hours as needed for severe pain (Patient not taking: Reported on 02/11/2016) 02/04/2016: Not taking; prefers Naproxen 500mg ; has Naproxen 500mg  on hand prescribed for BID  . SPIRIVA HANDIHALER 18 MCG inhalation capsule Reported on 03/03/2016 01/20/2016: Received from: External Pharmacy   No facility-administered encounter medications on file as of 03/03/2016.    Functional Status: In your present state of health, do you have any difficulty performing the following activities: 02/17/2016 01/20/2016  Hearing? N Y  Vision? N N  Difficulty concentrating or making decisions? N N  Walking or climbing stairs? N N  Dressing or bathing? N N  Doing errands, shopping? N N  Preparing Food and eating ? N -  Using the Toilet? N -  In the past six months, have you accidently leaked urine? N -  Do you have problems with loss of bowel control? N -  Managing your Medications? N -  Managing your Finances? N -  Housekeeping or managing your Housekeeping? Y -    Fall/Depression Screening: PHQ 2/9 Scores 02/17/2016  PHQ - 2 Score 0    Assessment:  1) Weight gain:    Per patient self-recorded weight:  02/26/16--149 lbs 02/27/16---149 lbs 02/28/16---149.8 lbs 02/29/16---148.8  lbs 03/01/16---146.6 lbs 03/02/16---150.4 lbs 03/03/16---151 lbs  Concern for weight increase as patient reports he has not spoken with his cardiology office this and he does report eating some pepperoni.    2) Medication adherence:  Patient's spouse filled his pill planner.  She requested I evaluate Friday evening slot since there was an extra pill  compared to the other nights.  It had an extra dose of atorvastatin, this was removed so only one dose was in the pill planner and tablet was returned to prescription bottle.  3) Offered referral to [facility] for patient to discuss any potential resources with him and at this time patient declined this service---he was counseled that he could request it a later time if he is interested.     Plan:  1) Placed a call to [facility] in [place] and spoke with Vernie Murders, RN regarding patient's weight gain with the upcoming weekend.  Provided the weights to [facility] that patient had recorded and she reports she would discuss with [name], NP with [facility] and contact patient/spouse.   2) Discussed with patient that salt and pepperoni can increase fluid retention in one's body.  Discussed importance of avoiding extra salt intake and that an alternative could be Mrs Deliah Boston in place of salt.    3) Spouse continues to fill pill planner for patient and plans to continue to do this for him.   4) Pharmacist will follow-up with patient in about 2 weeks via phone and will update [place], Delrae Rend.    Karrie Meres, PharmD, [place] [phone]

## 2016-03-03 NOTE — Telephone Encounter (Signed)
I received call from [place],  concerned about recent weight gain with Ricardo Garrett.His weight on 146.6 on 02/29/16 and today was 151 lbs. Patient is only taking Lasix 40 mg daily vs BID as per hospital discharge instructions mandated.Ricardo Garrett felt that perhaps Ricardo Garrett had decreased dose down to daily.I do not have access to any recent lab work from [place]. Patient has history of anasarca and admits to eating salt and pepperonis. Ricardo Garrett encouraged use of Mrs dash instead. There was concern with the weekend coming that patient would gain more weight and not have a plan for extra diuresis . I spoke with Ricardo Long NP who has seen this patient in the past.Her recommendation is for patient to take extra 40 mg Lasix and 20 meq Potassium tonight and for the next 3 days taking Lasix 40 mg BID and KCL 20 meq BID. He will weigh self daily.I will touch base with them tomorrow afternoon.

## 2016-03-03 NOTE — Patient Outreach (Signed)
[facility]) Care Management  03/03/2016  Yuniel Sawhney 05/11/1946 [id number]  Pharmacist was scheduled to call and follow-up with patient today on medication adherence.  Called patient, and spouse, listed on [facility] consent form answered, she verified her name, patient's name and patient's date of birth.   Pharmacist discussed that he will be in the area of patient's residence today and offered to review medications over the phone or stop by patient's home and review medications and pill planner.  Spouse preferred that pharmacist stop by patient's home to review pill planner with patient and her in person.   Plan:  Will plan to make a home visit with patient to review medications and pill planner this afternoon.    Karrie Meres, PharmD, [place] [phone]

## 2016-03-07 NOTE — Telephone Encounter (Signed)
LM,unable to reach pt regarding weight,asked he call back to discuss.C CarltonRN

## 2016-03-08 ENCOUNTER — Other Ambulatory Visit: Payer: Self-pay | Admitting: *Deleted

## 2016-03-08 NOTE — Patient Outreach (Signed)
[facility]) Care Management  03/08/2016  Kemaurion Horwitz 06-13-1946 [id number]   Call received from Mr. Rathgeber's wife this morning who reported a weight gain of 3# overnight for Mr. Bosher from 147lb to 150lb 2oz. Mrs. Treharne says Mr. Hailu denied any symptoms (he was outside during the time of the call) such as chest pain, shortness of breath, swelling of the abdomen or extremities. She stated "he's just going on about his business doing what he usually does". Mrs. Pere states Mr. Moyse is taking all medications including diuretics exactly as prescribed.   I advised Mrs. Grahn to have Mr. Ramani continue medications as prescribed, daily weights, and to call for more weight gain/shortness of breath/chest pain/swelling or any other new symptom. I advised that I would notify the cardiologist and Mr. Gillman should maintain his current regimen unless otherwise notified.   Plan: Mr. Kirchberg will see [name] NP [facility] [place]) as scheduled on 03/14/16.  I will see Mr. Tindel at home as scheduled for routine home visit on 03/24/16.   [place] Management  [phone]

## 2016-03-09 LAB — CBC WITH DIFFERENTIAL/PLATELET
BASOS ABS: 0 {cells}/uL (ref 0–200)
Basophils Relative: 0 %
EOS PCT: 4 %
Eosinophils Absolute: 340 cells/uL (ref 15–500)
HCT: 45.1 % (ref 38.5–50.0)
Hemoglobin: 14.8 g/dL (ref 13.2–17.1)
Lymphocytes Relative: 16 %
Lymphs Abs: 1360 cells/uL (ref 850–3900)
MCH: 29.7 pg (ref 27.0–33.0)
MCHC: 32.8 g/dL (ref 32.0–36.0)
MCV: 90.4 fL (ref 80.0–100.0)
MONOS PCT: 13 %
MPV: 10 fL (ref 7.5–12.5)
Monocytes Absolute: 1105 cells/uL — ABNORMAL HIGH (ref 200–950)
NEUTROS PCT: 67 %
Neutro Abs: 5695 cells/uL (ref 1500–7800)
PLATELETS: 239 10*3/uL (ref 140–400)
RBC: 4.99 MIL/uL (ref 4.20–5.80)
RDW: 16.7 % — ABNORMAL HIGH (ref 11.0–15.0)
WBC: 8.5 10*3/uL (ref 3.8–10.8)

## 2016-03-09 LAB — PROTIME-INR
INR: 1.22 (ref <1.50)
PROTHROMBIN TIME: 15.6 s — AB (ref 11.6–15.2)

## 2016-03-10 ENCOUNTER — Telehealth: Payer: Self-pay

## 2016-03-10 DIAGNOSIS — E875 Hyperkalemia: Secondary | ICD-10-CM

## 2016-03-10 DIAGNOSIS — Z79899 Other long term (current) drug therapy: Secondary | ICD-10-CM

## 2016-03-10 LAB — BASIC METABOLIC PANEL
BUN: 21 mg/dL (ref 7–25)
CHLORIDE: 101 mmol/L (ref 98–110)
CO2: 27 mmol/L (ref 20–31)
CREATININE: 1.2 mg/dL — AB (ref 0.70–1.18)
Calcium: 9.1 mg/dL (ref 8.6–10.3)
GLUCOSE: 105 mg/dL — AB (ref 65–99)
Potassium: 5.4 mmol/L — ABNORMAL HIGH (ref 3.5–5.3)
Sodium: 135 mmol/L (ref 135–146)

## 2016-03-10 MED ORDER — POTASSIUM CHLORIDE CRYS ER 10 MEQ PO TBCR: 10.0000 meq | EXTENDED_RELEASE_TABLET | Freq: Every day | ORAL | Status: DC

## 2016-03-10 NOTE — Telephone Encounter (Signed)
-----   Message from Josue Hector, MD sent at 03/10/2016  9:18 AM EDT ----- Decrease Kdur to 10 / day and repeat BMET in 4 weeks

## 2016-03-10 NOTE — Telephone Encounter (Signed)
Patient is to have lab work done at [facility] in [place]. Sent medication to patient's pharmacy.

## 2016-03-14 ENCOUNTER — Ambulatory Visit (INDEPENDENT_AMBULATORY_CARE_PROVIDER_SITE_OTHER): Payer: Commercial Managed Care - HMO | Admitting: Adult Health

## 2016-03-14 ENCOUNTER — Encounter: Payer: Self-pay | Admitting: Adult Health

## 2016-03-14 VITALS — BP 106/64 | HR 72 | Ht 68.0 in | Wt 157.0 lb

## 2016-03-14 DIAGNOSIS — I481 Persistent atrial fibrillation: Secondary | ICD-10-CM | POA: Diagnosis not present

## 2016-03-14 DIAGNOSIS — I4819 Other persistent atrial fibrillation: Secondary | ICD-10-CM

## 2016-03-14 DIAGNOSIS — I5022 Chronic systolic (congestive) heart failure: Secondary | ICD-10-CM

## 2016-03-14 NOTE — Progress Notes (Signed)
Cardiology Office Note   Date:  03/14/2016   ID:  Madhav Levens, DOB 1946/05/10, MRN [id number]  PCP:  Wende Neighbors, MD  Cardiologist: Lamar Sprinkles, NP   Chief Complaint  Patient presents with  . Atrial Fibrillation      History of Present Illness: Ricardo Garrett is a 70 y.o. male who presents for ongoing assessment and management of systolic CHF, history of anasarca, rapid atrial fib, chronic lower extremity edema. Mild renal insufficiency. He was last seen by Dr.Nishan on 02/22/2016. He had recently been admitted for decompensated CHF.he is being planned for a DCCV in June based upon last office visit. He was also referred to primary care and renal for ongoing management of renal insufficiency.he is also being followed [facility], and was seen on 03/08/2016, at that point he again 3 pounds overnight rising from 147 -157 pounds. He was asymptomatic. He was medically compliant. He offers no complaints of lower extremity edema. He continues to struggle little with salt.  He has been scheduled for cardioversion on May 31 with Dr.Nishan. He has his preoperative evaluation paperwork to include labs which are going to be completed tomorrow. He denies any rapid heart rhythm bleeding issues or bruising.  Past Medical History  Diagnosis Date  . High cholesterol   . Broken arm   . COPD (chronic obstructive pulmonary disease) ([place])   . Hypertension   . Oxygen deficiency     Past Surgical History  Procedure Laterality Date  . Tonsillectomy    . Skin graft    . Compression hip screw Right 03/15/2013    Procedure: COMPRESSION HIP;  Surgeon: Sanjuana Kava, MD;  Location: [facility];  Service: Orthopedics;  Laterality: Right;     Current Outpatient Prescriptions  Medication Sig Dispense Refill  . albuterol (PROVENTIL HFA;VENTOLIN HFA) 108 (90 Base) MCG/ACT inhaler Inhale 2 puffs into the lungs every 6 (six) hours as needed for wheezing or shortness of breath.    .  alfuzosin (UROXATRAL) 10 MG 24 hr tablet Take 1 tablet (10 mg total) by mouth daily with breakfast. 30 tablet 1  . apixaban (ELIQUIS) 5 MG TABS tablet Take 1 tablet (5 mg total) by mouth 2 (two) times daily. 60 tablet 1  . atorvastatin (LIPITOR) 20 MG tablet Take 20 mg by mouth daily. Reported on 02/04/2016    . digoxin (LANOXIN) 0.125 MG tablet Take 1 tablet (0.125 mg total) by mouth daily. 30 tablet 1  . furosemide (LASIX) 40 MG tablet Take 40 mg by mouth daily.    . metoprolol succinate (TOPROL XL) 25 MG 24 hr tablet Take 1 tablet (25 mg total) by mouth 2 (two) times daily after a meal. 180 tablet 1  . potassium chloride (K-DUR,KLOR-CON) 10 MEQ tablet Take 1 tablet (10 mEq total) by mouth daily. 90 tablet 3  . BREO ELLIPTA 200-25 MCG/INH AEPB Inhale 1 puff into the lungs daily. Reported on 03/14/2016    . SPIRIVA HANDIHALER 18 MCG inhalation capsule Reported on 03/14/2016     No current facility-administered medications for this visit.    Allergies:   Review of patient's allergies indicates no known allergies.    Social History:  The patient  reports that he quit smoking about 36 years ago. He does not have any smokeless tobacco history on file. He reports that he drinks alcohol. He reports that he uses illicit drugs (Marijuana).   Family History:  The patient's family history is not on file.  ROS: All other systems are reviewed and negative. Unless otherwise mentioned in H&P    PHYSICAL EXAM: VS:  BP 106/64 mmHg  Pulse 72  Ht 5\' 8"  (1.727 m)  Wt 157 lb (71.215 kg)  BMI 23.88 kg/m2  SpO2 96% , BMI Body mass index is 23.88 kg/(m^2). GEN: Well nourished, well developed, in no acute distress HEENT: normal Neck: no JVD, carotid bruits, or masses Cardiac: IRRR; no murmurs, rubs, or gallops,no edema  Respiratory:  Clear to auscultation bilaterally, normal work of breathing GI: soft, nontender, nondistended, + BS MS: no deformity or atrophymild puffiness in his ankles bilaterally, no  pitting edema Skin: warm and dry, no rash Neuro:  Strength and sensation are intact Psych: euthymic mood, full affect   Recent Labs: 01/20/2016: TSH 3.998 02/12/2016: ALT 41 03/09/2016: BUN 21; Creat 1.20*; Hemoglobin 14.8; Platelets 239; Potassium 5.4*; Sodium 135    Lipid Panel    Component Value Date/Time   CHOL 193 02/12/2016   TRIG 196 02/12/2016   HDL 29 02/12/2016   CHOLHDL 6.7 02/12/2016   VLDL 39 02/12/2016   LDLCALC 125 02/12/2016      Wt Readings from Last 3 Encounters:  03/14/16 157 lb (71.215 kg)  03/08/16 150 lb 2 oz (68.096 kg)  02/25/16 147 lb (66.679 kg)     ASSESSMENT AND PLAN:  1. Atrial fibrillation: heart rate is currently well-controlled. He denies any symptoms of dizziness, rapid heart rhythm, or bleeding or bruising issues. The patient will present for cardioversion with Dr.Nishan as scheduled.no medication adjustments at this time  2. Diastolic CHF: I have reviewed his weights on their recording sheet. He has fluctuated up 4-5 pounds and then loses 1-2 pounds. In our scale he is up 7 pounds from the ninth. He has no evidence of fluid retention on exam. He is to take an extra dose of 20 mg of Lasix if he were to gain an additional 2 pounds over the next couple days. This is been also instructed through [facility].  3. Hypertension: blood pressure is soft. He is asymptomatic. Will follow   Current medicines are reviewed at length with the patient today.    Labs/ tests ordered today include: No orders of the defined types were placed in this encounter.     Disposition:   FU with Dr. Johnsie Cancel in 1 month Signed, [name], NP  03/14/2016 1:40 PM    [place]. [address] [place], [place] [address] Phone: [phone]; Fax: [phone]

## 2016-03-14 NOTE — Patient Instructions (Signed)
Your physician recommends that you schedule a follow-up appointment after cardioversion   Your physician recommends that you continue on your current medications as directed. Please refer to the Current Medication list given to you today.  If you need a refill on your cardiac medications before your next appointment, please call your pharmacy.  Thank you for choosing [facility]!

## 2016-03-14 NOTE — Progress Notes (Deleted)
Name: Ricardo Garrett    DOB: [birth year]  Age: 70 y.o.  MR#: [id number]       PCP:  Wende Neighbors, MD      Insurance: Payor: [email or web address] MEDICARE / Plan: [place] THN/NTSP / Product Type: *No Product type* /   CC:   No chief complaint on file.   VS Filed Vitals:   03/14/16 1307  BP: 106/64  Pulse: 72  Height: 5\' 8"  (1.727 m)  Weight: 157 lb (71.215 kg)  SpO2: 96%    Weights Current Weight  03/14/16 157 lb (71.215 kg)  03/08/16 150 lb 2 oz (68.096 kg)  02/25/16 147 lb (66.679 kg)    Blood Pressure  BP Readings from Last 3 Encounters:  03/14/16 106/64  02/25/16 106/64  02/24/16 106/62     Admit date:  (Not on file) Last encounter with RMR:  02/09/2016   Allergy Review of patient's allergies indicates no known allergies.  Current Outpatient Prescriptions  Medication Sig Dispense Refill  . albuterol (PROVENTIL HFA;VENTOLIN HFA) 108 (90 Base) MCG/ACT inhaler Inhale 2 puffs into the lungs every 6 (six) hours as needed for wheezing or shortness of breath.    . alfuzosin (UROXATRAL) 10 MG 24 hr tablet Take 1 tablet (10 mg total) by mouth daily with breakfast. 30 tablet 1  . apixaban (ELIQUIS) 5 MG TABS tablet Take 1 tablet (5 mg total) by mouth 2 (two) times daily. 60 tablet 1  . atorvastatin (LIPITOR) 20 MG tablet Take 20 mg by mouth daily. Reported on 02/04/2016    . digoxin (LANOXIN) 0.125 MG tablet Take 1 tablet (0.125 mg total) by mouth daily. 30 tablet 1  . furosemide (LASIX) 40 MG tablet Take 40 mg by mouth daily.    . metoprolol succinate (TOPROL XL) 25 MG 24 hr tablet Take 1 tablet (25 mg total) by mouth 2 (two) times daily after a meal. 180 tablet 1  . potassium chloride (K-DUR,KLOR-CON) 10 MEQ tablet Take 1 tablet (10 mEq total) by mouth daily. 90 tablet 3  . BREO ELLIPTA 200-25 MCG/INH AEPB Inhale 1 puff into the lungs daily. Reported on 03/14/2016    . SPIRIVA HANDIHALER 18 MCG inhalation capsule Reported on 03/14/2016     No current facility-administered medications  for this visit.    Discontinued Meds:    Medications Discontinued During This Encounter  Medication Reason  . furosemide (LASIX) 40 MG tablet Error  . HYDROcodone-acetaminophen (NORCO/VICODIN) 5-325 MG per tablet Error    Patient Active Problem List   Diagnosis Date Noted  . Acute systolic CHF (congestive heart failure) ([place]) 01/21/2016  . Pleural effusion on right   . Atrial fibrillation with rapid ventricular response ([place]) 01/20/2016  . Anxiety disorder 01/19/2016  . COPD (chronic obstructive pulmonary disease) ([place]) 01/19/2016  . Anasarca 01/19/2016  . CKD (chronic kidney disease) stage 3, GFR 30-59 ml/min 01/19/2016  . Hypoalbuminemia 01/19/2016  . Hip pain 04/29/2013  . Difficulty in walking(719.7) 04/29/2013  . Dermatitis 03/26/2013  . Other and unspecified hyperlipidemia 03/19/2013  . Unspecified constipation 03/19/2013  . Arrhythmia 03/19/2013  . Hyponatremia 03/19/2013  . Anticoagulated 03/19/2013  . Acute blood loss anemia 03/16/2013  . Intertrochanteric fracture of right hip (HCC) 03/14/2013    LABS    Component Value Date/Time   NA 135 03/09/2016 0900   NA 136 02/12/2016 1100   NA 135 01/26/2016 0625   NA 138 01/25/2016 0529   K 5.4* 03/09/2016 0900   K 5.6 02/12/2016 1100  K 3.5 01/26/2016 0625   K 3.6 01/25/2016 0529   CL 101 03/09/2016 0900   CL 97 02/12/2016 1100   CL 94* 01/26/2016 0625   CL 95* 01/25/2016 0529   CO2 27 03/09/2016 0900   CO2 26 02/12/2016 1100   CO2 31 01/26/2016 0625   CO2 32 01/25/2016 0529   GLUCOSE 105* 03/09/2016 0900   GLUCOSE 93 01/26/2016 0625   GLUCOSE 92 01/25/2016 0529   BUN 21 03/09/2016 0900   BUN 34 02/12/2016 1100   BUN 24* 01/26/2016 0625   BUN 22* 01/25/2016 0529   CREATININE 1.20* 03/09/2016 0900   CREATININE 1.21 01/26/2016 0625   CREATININE 1.17 01/25/2016 0529   CREATININE 1.35* 01/24/2016 0559   CALCIUM 9.1 03/09/2016 0900   CALCIUM 9.0 02/12/2016 1100   CALCIUM 8.1* 01/26/2016 0625   CALCIUM  8.3* 01/25/2016 0529   GFRNONAA 58* 01/26/2016 0625   GFRNONAA >60 01/25/2016 0529   GFRNONAA 51* 01/24/2016 0559   GFRAA >60 01/26/2016 0625   GFRAA >60 01/25/2016 0529   GFRAA 59* 01/24/2016 0559   CMP     Component Value Date/Time   NA 135 03/09/2016 0900   NA 136 02/12/2016 1100   K 5.4* 03/09/2016 0900   K 5.6 02/12/2016 1100   CL 101 03/09/2016 0900   CL 97 02/12/2016 1100   CO2 27 03/09/2016 0900   CO2 26 02/12/2016 1100   GLUCOSE 105* 03/09/2016 0900   BUN 21 03/09/2016 0900   BUN 34 02/12/2016 1100   CREATININE 1.20* 03/09/2016 0900   CREATININE 1.21 01/26/2016 0625   CALCIUM 9.1 03/09/2016 0900   CALCIUM 9.0 02/12/2016 1100   PROT 5.8 02/12/2016 1100   PROT 4.8* 01/21/2016 1052   ALBUMIN 3.2 02/12/2016 1100   ALBUMIN 2.6* 01/21/2016 1052   AST 27 02/12/2016 1100   AST 21 01/21/2016 1052   ALT 41 02/12/2016 1100   ALT 22 01/21/2016 1052   ALKPHOS 155 02/12/2016 1100   ALKPHOS 126 01/21/2016 1052   BILITOT 0.4 02/12/2016 1100   BILITOT 1.2 01/21/2016 1052   GFRNONAA 58* 01/26/2016 0625   GFRAA >60 01/26/2016 0625       Component Value Date/Time   WBC 8.5 03/09/2016 0900   WBC 8.7 02/12/2016 1100   WBC 13.2* 01/26/2016 0625   HGB 14.8 03/09/2016 0900   HGB 14.9 01/26/2016 0625   HGB 15.3 01/25/2016 0529   HCT 45.1 03/09/2016 0900   HCT 48 02/12/2016 1100   HCT 44.8 01/26/2016 0625   HCT 46.7 01/25/2016 0529   MCV 90.4 03/09/2016 0900   MCV 89.2 02/12/2016 1100   MCV 90.1 01/26/2016 0625   MCV 90.2 01/25/2016 0529    Lipid Panel     Component Value Date/Time   CHOL 193 02/12/2016   TRIG 196 02/12/2016   HDL 29 02/12/2016   CHOLHDL 6.7 02/12/2016   VLDL 39 02/12/2016   LDLCALC 125 02/12/2016    ABG    Component Value Date/Time   PHART 7.407 01/23/2016 1910   PCO2ART 49.8* 01/23/2016 1910   PO2ART 85.2 01/23/2016 1910   HCO3 29.1* 01/23/2016 1910   O2SAT 95.4 01/23/2016 1910     Lab Results  Component Value Date   TSH 3.998  01/20/2016   BNP (last 3 results) No results for input(s): BNP in the last 8760 hours.  ProBNP (last 3 results) No results for input(s): PROBNP in the last 8760 hours.  Cardiac Panel (last 3 results) No results for  input(s): CKTOTAL, CKMB, TROPONINI, RELINDX in the last 72 hours.  Iron/TIBC/Ferritin/ %Sat No results found for: IRON, TIBC, FERRITIN, IRONPCTSAT   EKG Orders placed or performed in visit on 01/20/16  . EKG 12-Lead     Prior Assessment and Plan Problem List as of 03/14/2016      Cardiovascular and Mediastinum   Arrhythmia   Atrial fibrillation with rapid ventricular response (HCC)   Acute systolic CHF (congestive heart failure) (HCC)     Respiratory   COPD (chronic obstructive pulmonary disease) (HCC)   Pleural effusion on right     Digestive   Unspecified constipation     Musculoskeletal and Integument   Intertrochanteric fracture of right hip (HCC)   Dermatitis     Genitourinary   CKD (chronic kidney disease) stage 3, GFR 30-59 ml/min     Other   Acute blood loss anemia   Other and unspecified hyperlipidemia   Hyponatremia   Anticoagulated   Hip pain   Difficulty in walking(719.7)   Anxiety disorder   Anasarca   Hypoalbuminemia       Imaging: Dg Chest 2 View  02/23/2016  CLINICAL DATA:  Shortness of breath for 2 months. Recent pleural effusion. EXAM: CHEST  2 VIEW COMPARISON:  01/21/2016 FINDINGS: Interval resolution of the right effusion since prior study. Minimal residual density at the right lung base, most likely scarring or atelectasis. Left lung is clear. Heart is normal size. No acute bony abnormality. IMPRESSION: Resolution of the previously seen right pleural effusion and improving aeration at the right base. Residual scarring or atelectasis. Electronically Signed   By: Rolm Baptise M.D.   On: 02/23/2016 13:14

## 2016-03-17 ENCOUNTER — Other Ambulatory Visit: Payer: Self-pay | Admitting: Pharmacist

## 2016-03-17 ENCOUNTER — Telehealth: Payer: Self-pay | Admitting: Cardiovascular Disease

## 2016-03-17 ENCOUNTER — Encounter: Payer: Self-pay | Admitting: Pharmacist

## 2016-03-17 NOTE — Patient Outreach (Signed)
[facility]) Care Management  03/17/2016  Ricardo Garrett 11-Aug-1946 [id number]  [facility] Pharmacist placed follow-up call to follow-up patient adherence to his medications.  Patient's spouse, [name] answered and is on patient's consent form.  She verified patient identity.  Spouse reports she is doing okay filling pill planner for patient but still uncertain if she is filling it correct and wished for it to be reviewed.    Plan:  Will make home visit tomorrow to review filling planner again with spouse and patient.   Placed call to [place], [place] Coordinator---she is scheduled to see patient next week and is okay with reviewing pill planner with patient and spouse at her next scheduled home visit as well.    Karrie Meres, PharmD, [place] [phone]

## 2016-03-17 NOTE — Telephone Encounter (Signed)
Follow Up  Pt called request a call back to to discuss in details the CARDIOVERSION procedure that the pt is due to have. Please call to clarify

## 2016-03-18 ENCOUNTER — Other Ambulatory Visit: Payer: Self-pay | Admitting: Pharmacist

## 2016-03-18 NOTE — Telephone Encounter (Signed)
Unable to leave message, voicemail box is full. Will try again later.

## 2016-03-18 NOTE — Patient Outreach (Signed)
[facility]) Care Management  [place]   03/18/2016  Ricardo Garrett 11-07-1945 [id number]  Subjective:  [facility] Pharmacist made a routine home visit to patient's place of residence and met with patient and his spouse, on consent form, regarding medication adherence.    Spouse is filling patient's pill planners and was out of some medications and unsure what to do.  Patient reports he is doing well and denies medication side effects.    Patient's spouse was able to fill patient's 7 day pill planner correctly.  Based on prescription bottles patient has, he needs refills on his atorvastatin, alfuzosin, furosemide, and metoprolol succinate.      Objective:   Encounter Medications: Outpatient Encounter Prescriptions as of 03/18/2016  Medication Sig Note  . albuterol (PROVENTIL HFA;VENTOLIN HFA) 108 (90 Base) MCG/ACT inhaler Inhale 2 puffs into the lungs every 6 (six) hours as needed for wheezing or shortness of breath.   . alfuzosin (UROXATRAL) 10 MG 24 hr tablet Take 1 tablet (10 mg total) by mouth daily with breakfast.   . apixaban (ELIQUIS) 5 MG TABS tablet Take 1 tablet (5 mg total) by mouth 2 (two) times daily.   Marland Kitchen atorvastatin (LIPITOR) 20 MG tablet Take 20 mg by mouth daily. Reported on 02/04/2016 02/04/2016: Has on hand; has not been taken; put in pill box today  . BREO ELLIPTA 200-25 MCG/INH AEPB Inhale 1 puff into the lungs daily. Reported on 03/14/2016 01/20/2016: Received from: External Pharmacy  . digoxin (LANOXIN) 0.125 MG tablet Take 1 tablet (0.125 mg total) by mouth daily.   . furosemide (LASIX) 40 MG tablet Take 40 mg by mouth daily.   . metoprolol succinate (TOPROL XL) 25 MG 24 hr tablet Take 1 tablet (25 mg total) by mouth 2 (two) times daily after a meal. 03/03/2016: Patient reports decreased to 12.5 mg (1/2 tablet) once daily by Dr [place] Crane.    . potassium chloride (K-DUR,KLOR-CON) 10 MEQ tablet Take 1 tablet (10 mEq total) by mouth daily.   Marland Kitchen SPIRIVA HANDIHALER  18 MCG inhalation capsule Reported on 03/14/2016 01/20/2016: Received from: External Pharmacy   No facility-administered encounter medications on file as of 03/18/2016.    Functional Status: In your present state of health, do you have any difficulty performing the following activities: 02/17/2016 01/20/2016  Hearing? N Y  Vision? N N  Difficulty concentrating or making decisions? N N  Walking or climbing stairs? N N  Dressing or bathing? N N  Doing errands, shopping? N N  Preparing Food and eating ? N -  Using the Toilet? N -  In the past six months, have you accidently leaked urine? N -  Do you have problems with loss of bowel control? N -  Managing your Medications? N -  Managing your Finances? N -  Housekeeping or managing your Housekeeping? Y -    Fall/Depression Screening: PHQ 2/9 Scores 02/17/2016  PHQ - 2 Score 0   Assessment:  1) Medication adherence:  Patient's spouse is filling patient's pill planner---she does seem to fill it correctly----provided reassurance to her that she appears to be filling pill planner correct.    2) Refills   Assisted patient and spouse with calling in refills to [facility] for furosemide, metoprolol succinate.  Patient is out of refills for atorvastatin and alfuzosin, which required [facility] to contact his prescriber for authorization.   Patient and spouse verbalized understanding to how to call in refills to his pharmacy when he needs refills in the  future.    Patient is also close to needing refills on digoxin and Eliquis, both of which were last written by hospitalist.    Plan:  1) Will send message to cardiology office regarding need for refills for Eliquis and digoxin if patient is to continue the medications.   2) Will place follow-up phone call to patient and spouse in about 2 weeks.  Anticipate potential pharmacy case closure soon as patient's pill planner is being filled by his spouse.    Karrie Meres, PharmD,  [place] [phone]

## 2016-03-18 NOTE — Telephone Encounter (Signed)
Unable to reach patient at number provided. Voicemail box is full.

## 2016-03-21 ENCOUNTER — Telehealth: Payer: Self-pay

## 2016-03-21 MED ORDER — DIGOXIN 125 MCG PO TABS: 0.1250 mg | ORAL_TABLET | Freq: Every day | ORAL | Status: DC

## 2016-03-21 MED ORDER — APIXABAN 5 MG PO TABS: 5.0000 mg | ORAL_TABLET | Freq: Two times a day (BID) | ORAL | Status: DC

## 2016-03-21 NOTE — Telephone Encounter (Signed)
Refills complete 

## 2016-03-21 NOTE — Telephone Encounter (Signed)
-----   Message from Lin Givens, [facility] sent at 03/21/2016 10:15 AM EDT ----- Lennox Solders:  My apologies in advance if I am sending this to the wrong person.    I saw Mr Fairfield as a home visit for [facility] on Friday---he is close to needing new prescriptions for Eliquis and digoxin.  Both were written by hopsitalist on discharge.   If cardiology is planning to continue him on these medications, it is possible for refills to be authorized and sent to [facility] in [place]?  He saw Ms Purcell Nails on 03/14/16.    Thanks for your help.  Lennette Bihari

## 2016-03-24 ENCOUNTER — Other Ambulatory Visit: Payer: Self-pay | Admitting: *Deleted

## 2016-03-24 ENCOUNTER — Ambulatory Visit: Payer: Self-pay | Admitting: *Deleted

## 2016-03-29 NOTE — Patient Instructions (Signed)
Ricardo Garrett  03/29/2016     @PREFPERIOPPHARMACY @   Your procedure is scheduled on 04/05/2016.  Report to [facility] at 7:30 A.M.  Call this number if you have problems the morning of surgery:  [phone]   Remember:  Do not eat food or drink liquids after midnight.  Take these medicines the morning of surgery with A SIP OF WATER Albuterol inhaler (bring with you to hospital as well), Uroxitrol, Eliquis (DO NOT MISS ANY DOSES UP UNTIL PROCEDURE).  Breo Ellipta, Digoxin, Metoprolol, Spiriva   Do not wear jewelry, make-up or nail polish.  Do not wear lotions, powders, or perfumes.  You may wear deodorant.  Do not shave 48 hours prior to surgery.  Men may shave face and neck.  Do not bring valuables to the hospital.  [facility] is not responsible for any belongings or valuables.  Contacts, dentures or bridgework may not be worn into surgery.  Leave your suitcase in the car.  After surgery it may be brought to your room.  For patients admitted to the hospital, discharge time will be determined by your treatment team.  Patients discharged the day of surgery will not be allowed to drive home.    Please read over the following fact sheets that you were given. Anesthesia Post-op Instructions     PATIENT INSTRUCTIONS POST-ANESTHESIA  IMMEDIATELY FOLLOWING SURGERY:  Do not drive or operate machinery for the first twenty four hours after surgery.  Do not make any important decisions for twenty four hours after surgery or while taking narcotic pain medications or sedatives.  If you develop intractable nausea and vomiting or a severe headache please notify your doctor immediately.  FOLLOW-UP:  Please make an appointment with your surgeon as instructed. You do not need to follow up with anesthesia unless specifically instructed to do so.  WOUND CARE INSTRUCTIONS (if applicable):  Keep a dry clean dressing on the anesthesia/puncture wound site if there is drainage.  Once the wound has  quit draining you may leave it open to air.  Generally you should leave the bandage intact for twenty four hours unless there is drainage.  If the epidural site drains for more than 36-48 hours please call the anesthesia department.  QUESTIONS?:  Please feel free to call your physician or the hospital operator if you have any questions, and they will be happy to assist you.      Electrical Cardioversion Electrical cardioversion is the delivery of a jolt of electricity to change the rhythm of the heart. Sticky patches or metal paddles are placed on the chest to deliver the electricity from a device. This is done to restore a normal rhythm. A rhythm that is too fast or not regular keeps the heart from pumping well. Electrical cardioversion is done in an emergency if:   There is low or no blood pressure as a result of the heart rhythm.   Normal rhythm must be restored as fast as possible to protect the brain and heart from further damage.   It may save a life. Cardioversion may be done for heart rhythms that are not immediately life threatening, such as atrial fibrillation or flutter, in which:   The heart is beating too fast or is not regular.   Medicine to change the rhythm has not worked.   It is safe to wait in order to allow time for preparation.  Symptoms of the abnormal rhythm are bothersome.  The risk of stroke and other serious problems  can be reduced. LET [facility] CARE PROVIDER KNOW ABOUT:   Any allergies you have.  All medicines you are taking, including vitamins, herbs, eye drops, creams, and over-the-counter medicines.  Previous problems you or members of your family have had with the use of anesthetics.   Any blood disorders you have.   Previous surgeries you have had.   Medical conditions you have. RISKS AND COMPLICATIONS  Generally, this is a safe procedure. However, problems can occur and include:   Breathing problems related to the anesthetic  used.  A blood clot that breaks free and travels to other parts of your body. This could cause a stroke or other problems. The risk of this is lowered by use of blood-thinning medicine (anticoagulant) prior to the procedure.  Cardiac arrest (rare). BEFORE THE PROCEDURE   You may have tests to detect blood clots in your heart and to evaluate heart function.  You may start taking anticoagulants so your blood does not clot as easily.   Medicines may be given to help stabilize your heart rate and rhythm. PROCEDURE  You will be given medicine through an IV tube to reduce discomfort and make you sleepy (sedative).   An electrical shock will be delivered. AFTER THE PROCEDURE Your heart rhythm will be watched to make sure it does not change.    This information is not intended to replace advice given to you by your health care provider. Make sure you discuss any questions you have with your health care provider.   Document Released: 10/07/2002 Document Revised: 11/07/2014 Document Reviewed: 05/01/2013 Elsevier Interactive Patient Education [employer].

## 2016-03-30 ENCOUNTER — Encounter (HOSPITAL_COMMUNITY): Payer: Self-pay

## 2016-03-30 ENCOUNTER — Encounter (HOSPITAL_COMMUNITY)
Admission: RE | Admit: 2016-03-30 | Discharge: 2016-03-30 | Disposition: A | Discharge status: Home / Self Care | Payer: Commercial Managed Care - HMO | Source: Ambulatory Visit | Attending: Cardiovascular Disease | Admitting: Cardiovascular Disease

## 2016-03-30 DIAGNOSIS — Z01812 Encounter for preprocedural laboratory examination: Secondary | ICD-10-CM | POA: Insufficient documentation

## 2016-03-30 DIAGNOSIS — I4891 Unspecified atrial fibrillation: Secondary | ICD-10-CM | POA: Insufficient documentation

## 2016-03-30 DIAGNOSIS — I10 Essential (primary) hypertension: Secondary | ICD-10-CM | POA: Insufficient documentation

## 2016-03-30 HISTORY — DX: Unspecified osteoarthritis, unspecified site: M19.90

## 2016-03-30 HISTORY — DX: Heart failure, unspecified: I50.9

## 2016-03-30 HISTORY — DX: Unspecified hearing loss, unspecified ear: H91.90

## 2016-03-30 LAB — CBC
HCT: 43.6 % (ref 39.0–52.0)
Hemoglobin: 14.3 g/dL (ref 13.0–17.0)
MCH: 29.5 pg (ref 26.0–34.0)
MCHC: 32.8 g/dL (ref 30.0–36.0)
MCV: 90.1 fL (ref 78.0–100.0)
PLATELETS: 266 10*3/uL (ref 150–400)
RBC: 4.84 MIL/uL (ref 4.22–5.81)
RDW: 16.9 % — AB (ref 11.5–15.5)
WBC: 7.9 10*3/uL (ref 4.0–10.5)

## 2016-03-30 LAB — BASIC METABOLIC PANEL
Anion gap: 8 (ref 5–15)
BUN: 34 mg/dL — ABNORMAL HIGH (ref 6–20)
CALCIUM: 8.8 mg/dL — AB (ref 8.9–10.3)
CO2: 24 mmol/L (ref 22–32)
CREATININE: 1.51 mg/dL — AB (ref 0.61–1.24)
Chloride: 103 mmol/L (ref 101–111)
GFR calc non Af Amer: 45 mL/min — ABNORMAL LOW (ref >60)
GFR, EST AFRICAN AMERICAN: 52 mL/min — AB (ref >60)
Glucose, Bld: 143 mg/dL — ABNORMAL HIGH (ref 65–99)
Potassium: 4.2 mmol/L (ref 3.5–5.1)
SODIUM: 135 mmol/L (ref 135–145)

## 2016-03-30 NOTE — Telephone Encounter (Signed)
No answer, no voicemail to leave message

## 2016-03-30 NOTE — Pre-Procedure Instructions (Signed)
Patient given information to sign up for my chart at home. 

## 2016-03-30 NOTE — Telephone Encounter (Signed)
New message    Pt and wife is calling back for lab results

## 2016-03-30 NOTE — Patient Outreach (Signed)
[facility]) Care Management   03/24/2016  Ricardo Garrett 05/06/1946 [id number]  Ricardo Garrett is an 70 y.o. male wiith COPD, pulmonary nodule, and CKD stage 3. He presented to his cardiology office on 01/18/16 with anasarca, atelectasis and consolidation on chest xray, and afib with RVR. He was consequently sent to [facility] for direct admission and treated for COPD exacerbation, afib with RVR, and urinary retention., Ricardo Garrett also experienced confusion during his hospitalization which was thought to be related to medication side effect.   Ricardo Garrett was provided with a digital scale, an electronic blood pressure monitor, and a blue [facility] Care Management notebook with bp, pulse, and weight documentation logs. In depth education was provided for CHF and HTN disease management. Thorough medication review with patient and spouse was completed and Pharmacy consult was made with several pharmacy home visits.  Ricardo Garrett and his wife have been very receptive to education and have worked diligently on self monitoring and adherence to provider appointments.    Today, I am seeing Ricardo Garrett at home for continued follow up.   Subjective: "I really think I'm doing pretty good. I have to get a kidney ultrasound with the kidney doctor. But other than that, I'm on track."  Objective: BP 120/78 mmHg  Pulse 78  Wt 154 lb 2 oz (69.911 kg)  SpO2 98%  Review of Systems  Constitutional: Negative.   Eyes: Negative.   Respiratory: Negative for cough, sputum production, shortness of breath and wheezing.   Cardiovascular: Negative for chest pain, palpitations, orthopnea and leg swelling.  Gastrointestinal: Negative.   Genitourinary: Negative.   Musculoskeletal: Positive for myalgias and joint pain. Negative for falls.  Skin: Negative.   Neurological: Negative.   Psychiatric/Behavioral: Negative.        States he is "encouraged and making progress"    Physical Exam   Constitutional: He is oriented to person, place, and time. Vital signs are normal. He appears well-developed and well-nourished. He is active.  Neck: No JVD present.  Cardiovascular: Normal rate, regular rhythm, S1 normal, S2 normal, normal heart sounds and intact distal pulses.  Exam reveals no friction rub.   No murmur heard. Respiratory: Effort normal and breath sounds normal.  GI: Soft. Bowel sounds are normal.  Neurological: He is alert and oriented to person, place, and time.  Skin: Skin is warm and dry.  Psychiatric: He has a normal mood and affect. His speech is normal and behavior is normal. Judgment and thought content normal. Cognition and memory are normal.    Encounter Medications:   Outpatient Encounter Prescriptions as of 03/24/2016  Medication Sig Note  . acetaminophen (TYLENOL) 500 MG tablet Take 1,000 mg by mouth daily.   Marland Kitchen albuterol (PROVENTIL HFA;VENTOLIN HFA) 108 (90 Base) MCG/ACT inhaler Inhale 2 puffs into the lungs every 6 (six) hours as needed for wheezing or shortness of breath. Reported on 03/21/2016   . alfuzosin (UROXATRAL) 10 MG 24 hr tablet Take 1 tablet (10 mg total) by mouth daily with breakfast.   . apixaban (ELIQUIS) 5 MG TABS tablet Take 1 tablet (5 mg total) by mouth 2 (two) times daily.   Marland Kitchen atorvastatin (LIPITOR) 20 MG tablet Take 20 mg by mouth daily. Reported on 02/04/2016   . BREO ELLIPTA 200-25 MCG/INH AEPB Inhale 1 puff into the lungs daily. Reported on 03/21/2016 03/22/2016: Patient states he is noncompliant with his inhalers.   . digoxin (LANOXIN) 0.125 MG tablet Take 1 tablet (0.125 mg total) by  mouth daily.   . furosemide (LASIX) 40 MG tablet Take 40 mg by mouth daily.   . metoprolol succinate (TOPROL XL) 25 MG 24 hr tablet Take 1 tablet (25 mg total) by mouth 2 (two) times daily after a meal. (Patient not taking: Reported on 03/22/2016) 03/03/2016: Patient reports decreased to 12.5 mg (1/2 tablet) once daily by Dr Nevada Crane.    . metoprolol tartrate  (LOPRESSOR) 25 MG tablet Take 12.5 mg by mouth 2 (two) times daily.    . potassium chloride (K-DUR,KLOR-CON) 10 MEQ tablet Take 1 tablet (10 mEq total) by mouth daily.   Marland Kitchen SPIRIVA HANDIHALER 18 MCG inhalation capsule Place 18 mcg into inhaler and inhale daily. Reported on 03/21/2016 03/22/2016: Patient states he is noncompliant with his inhalers.     Assessment:  70 year old gentleman living in [place], [place] with his wife and 2 dogs and 1 cat in his home. Ricardo Garrett was hospitalized on 01/20/16 with Anasarca/CHF/COPD Exacerbation. He has made tremendous strides in overall physical progress and self health management.   Acute/Chronic Health Condition (Afib) - Ricardo Garrett is scheduled for DCCV at [facility] on 04/02/16. We reviewed the materials provided him by the office when he was there for his appointment.  Pain Management concerns - Ricardo Garrett has history of intertrochanteric fracture of right hip for which he has been taking Naprosyn daily until he was started on Eliquis; Ricardo Garrett states his pain is not as well controlled off the Naprosyn and taking ES Tylenol as instructed by Dr. Nevada Crane but he says it is manageable and does not interfere with his daily activities.    Acute/Chronic Health condition (hypotension) - Ricardo Garrett's blood pressure was quite soft right after hospital discharge; his lopressor and lasix doses have been decreased to once daily and bp's have improved as have symptoms; Ricardo Garrett has been checking and recording his bp and pulse daily.   Chronic Health Condition (CHF) - Ricardo Garrett is taking Lasix 45m QD as prescribed; his weight was 154# today; he has no edema or abdominal swelling, breath sounds are clear; no JVD is noted; Ricardo Garrett using O2 @ 2l/Garrett but not continuously  Home Monitoring Results:  02/11/16 - 145.2 02/25/16 - 147.0 03/24/16 - 154.0  Medication Non-Adherence and Management needs - Ricardo Garrett's understanding of and adherence to his  medication regimen has improved drastically. [facility] Pharmac[facility] KKarrie Meresand I have worked closely with Mr. DArwoodand his wife and they are now using a pill box to avoid confusion about daily administration of medications.   Chronic Health Condition (CKD) - Mr. DTurpenis being followed by Dr. BLowanda Foster nephrology. Because of Mr. Komorowski's edema as a result of CHF and nephrotic syndrome, he was referred to nephrology. His CR baseline is around 2. 24H urine protein was very elevated at 3858. Mr. DWaggleis scheduled for an [facility] in office with Dr. BLowanda Foster6/2/17.   Plan:   Mr. DDianawill check his bp and pulse and weight and record daily, calling for findings outside established parameters (weight gain of 3# overnight or 5# in a week).   Mr. DHellstromwill call for new or worsened symptoms.   I will reach out to Mr. Silversmith by phone next month. He asked if I would follow up with him after he has his next nephrology appointment. If he continues to progress, he and his wife agree that transition to telephonic health coaching will be appropriate.  Most Recent Value   Care Plan Problem Two  New Health Condition (Nephrotic Syndrome) w/ Knowledge Deficits   Role Documenting the Problem Two  Care Management Coordinator   Care Plan for Problem Two  Active   Interventions for Problem Two Long Term Goal   Reviewed appointment plans and materials provideid by nephrologist for 04/01/16 appointment   THN Long Term Goal (31-90) days  Over the next 31 days, patient will verbalize basic understanding of disease process and plan of care for self health management of nephrology diagnoses   THN Long Term Goal Start Date  03/24/16   THN Long Term Goal Met Date     THN CM Short Term Goal #1 (0-30 days)  Over the next 10 days, patient will attend scheduled appointment   THN CM Short Term Goal #1 Start Date  03/24/16   [facility] CM Short Term Goal #1 Met Date      Interventions for Short  Term Goal #2   Reviewed scheduled appointment with patient   THN CM Short Term Goal #2 (0-30 days)  Over the next 30 days, patient will verbalize understanding of plan of care for kideny dysfiunction as outlined by nephrology   [facility] CM Short Term Goal #2 Start Date  03/24/16   Interventions for Short Term Goal #2  Advised patient to obtain copy of any materials/instrcutions at provider appointment,  planned follow up phone assessment    [facility] Management  [phone]

## 2016-03-31 ENCOUNTER — Telehealth: Payer: Self-pay | Admitting: Cardiovascular Disease

## 2016-03-31 DIAGNOSIS — Z79899 Other long term (current) drug therapy: Secondary | ICD-10-CM

## 2016-03-31 NOTE — Telephone Encounter (Signed)
Follow up    Pt wife is calling back to get her husbands lab results

## 2016-03-31 NOTE — Telephone Encounter (Signed)
Called patient with lab results. Per Dr. Johnsie Cancel, cut lasix back to once/day f/u BMET and BNP in 3 weeks. Patient verbalized understanding. Patient will come in on 04/21/16 for repeat lab work. Patient will take Lasix 40 mg daily, instead of twice daily.

## 2016-03-31 NOTE — Telephone Encounter (Signed)
Patient aware of labs and changes.

## 2016-04-01 ENCOUNTER — Other Ambulatory Visit: Payer: Self-pay | Admitting: Pharmacist

## 2016-04-01 NOTE — Patient Outreach (Signed)
[facility]) Care Management  [place]   04/01/2016  Ricardo Garrett 21-Feb-1946 [id number]  Subjective:  Follow-up phone call to patient to follow-up medication adherence.  Patient verified name and date of birth and put his spouse on the call with him.  Patient and spouse state they were able to get his medication refills since last pharmacy home visit and that patient has all medications.  Patient/spouse report pill planner is still being filled by spouse and spouse denies difficulty filling pill planner at this time.   They report he is seeing nephrology and that his furosemide was decreased by cardiology to once daily.  They did not wish to get his pill bottles and review them over the phone during this call.     Objective:   Current Medications: Current Outpatient Prescriptions  Medication Sig Dispense Refill  . acetaminophen (TYLENOL) 500 MG tablet Take 1,000 mg by mouth daily.    Marland Kitchen albuterol (PROVENTIL HFA;VENTOLIN HFA) 108 (90 Base) MCG/ACT inhaler Inhale 2 puffs into the lungs every 6 (six) hours as needed for wheezing or shortness of breath. Reported on 03/21/2016    . alfuzosin (UROXATRAL) 10 MG 24 hr tablet Take 1 tablet (10 mg total) by mouth daily with breakfast. 30 tablet 1  . apixaban (ELIQUIS) 5 MG TABS tablet Take 1 tablet (5 mg total) by mouth 2 (two) times daily. 60 tablet 6  . atorvastatin (LIPITOR) 20 MG tablet Take 20 mg by mouth daily. Reported on 02/04/2016    . BREO ELLIPTA 200-25 MCG/INH AEPB Inhale 1 puff into the lungs daily. Reported on 03/21/2016    . digoxin (LANOXIN) 0.125 MG tablet Take 1 tablet (0.125 mg total) by mouth daily. 30 tablet 6  . furosemide (LASIX) 40 MG tablet Take 40 mg by mouth daily.    . metoprolol succinate (TOPROL XL) 25 MG 24 hr tablet Take 1 tablet (25 mg total) by mouth 2 (two) times daily after a meal. (Patient not taking: Reported on 03/22/2016) 180 tablet 1  . metoprolol tartrate (LOPRESSOR) 25 MG tablet Take  12.5 mg by mouth 2 (two) times daily.     . potassium chloride (K-DUR,KLOR-CON) 10 MEQ tablet Take 1 tablet (10 mEq total) by mouth daily. 90 tablet 3  . SPIRIVA HANDIHALER 18 MCG inhalation capsule Place 18 mcg into inhaler and inhale daily. Reported on 03/21/2016     No current facility-administered medications for this visit.    Functional Status: In your present state of health, do you have any difficulty performing the following activities: 03/30/2016 02/17/2016  Hearing? Y N  Vision? N N  Difficulty concentrating or making decisions? Y N  Walking or climbing stairs? N N  Dressing or bathing? Y N  Doing errands, shopping? N N  Preparing Food and eating ? - N  Using the Toilet? - N  In the past six months, have you accidently leaked urine? - N  Do you have problems with loss of bowel control? - N  Managing your Medications? - N  Managing your Finances? - N  Housekeeping or managing your Housekeeping? - Y    Fall/Depression Screening: PHQ 2/9 Scores 02/17/2016  PHQ - 2 Score 0    Assessment:  Patient's medication compliance has improved since he began using a pill planner and his spouse started filling it weekly.   Reviewed the importance of patient continuing to take his medications as his providers prescribe and discussed potential pharmacy case closure with patient and spouse.  Plan:  Will plan to call patient in about 1 week as his cardioversion procedure is scheduled for next week, and patient requests another follow-up phone from [place].  Per review of [facility] community care coordinator note, Delrae Rend will potentially transition patient to [facility] telephonic services.    Karrie Meres, PharmD, [place] [phone]

## 2016-04-04 ENCOUNTER — Other Ambulatory Visit (HOSPITAL_COMMUNITY): Payer: Self-pay | Admitting: Medical

## 2016-04-04 DIAGNOSIS — N183 Chronic kidney disease, stage 3 unspecified: Secondary | ICD-10-CM

## 2016-04-05 ENCOUNTER — Encounter (HOSPITAL_COMMUNITY)
Admission: RE | Disposition: A | Discharge status: Home / Self Care | Payer: Self-pay | Source: Ambulatory Visit | Attending: Cardiovascular Disease

## 2016-04-05 ENCOUNTER — Ambulatory Visit (HOSPITAL_COMMUNITY)
Admission: RE | Admit: 2016-04-05 | Discharge: 2016-04-05 | Disposition: A | Discharge status: Home / Self Care | Payer: Commercial Managed Care - HMO | Source: Ambulatory Visit | Attending: Cardiovascular Disease | Admitting: Cardiovascular Disease

## 2016-04-05 ENCOUNTER — Ambulatory Visit (HOSPITAL_COMMUNITY): Payer: Commercial Managed Care - HMO | Admitting: Anesthesiology

## 2016-04-05 ENCOUNTER — Encounter (HOSPITAL_COMMUNITY): Payer: Self-pay | Admitting: *Deleted

## 2016-04-05 DIAGNOSIS — I4891 Unspecified atrial fibrillation: Secondary | ICD-10-CM | POA: Diagnosis not present

## 2016-04-05 DIAGNOSIS — Z87891 Personal history of nicotine dependence: Secondary | ICD-10-CM | POA: Diagnosis not present

## 2016-04-05 DIAGNOSIS — I5030 Unspecified diastolic (congestive) heart failure: Secondary | ICD-10-CM | POA: Diagnosis not present

## 2016-04-05 DIAGNOSIS — J449 Chronic obstructive pulmonary disease, unspecified: Secondary | ICD-10-CM | POA: Insufficient documentation

## 2016-04-05 DIAGNOSIS — Z7901 Long term (current) use of anticoagulants: Secondary | ICD-10-CM | POA: Insufficient documentation

## 2016-04-05 DIAGNOSIS — N189 Chronic kidney disease, unspecified: Secondary | ICD-10-CM | POA: Diagnosis not present

## 2016-04-05 DIAGNOSIS — I130 Hypertensive heart and chronic kidney disease with heart failure and stage 1 through stage 4 chronic kidney disease, or unspecified chronic kidney disease: Secondary | ICD-10-CM | POA: Diagnosis not present

## 2016-04-05 HISTORY — PX: CARDIOVERSION: SHX1299

## 2016-04-05 SURGERY — CARDIOVERSION
Anesthesia: Monitor Anesthesia Care

## 2016-04-05 MED ORDER — PROPOFOL 500 MG/50ML IV EMUL
INTRAVENOUS | Status: DC | PRN
  Administered 2016-04-05: 75 ug/kg/min via INTRAVENOUS

## 2016-04-05 MED ORDER — FENTANYL CITRATE (PF) 100 MCG/2ML IJ SOLN: 25.0000 ug | INTRAMUSCULAR | Status: DC | PRN

## 2016-04-05 MED ORDER — MIDAZOLAM HCL 2 MG/2ML IJ SOLN
INTRAMUSCULAR | Status: AC
  Filled 2016-04-05: qty 2

## 2016-04-05 MED ORDER — FENTANYL CITRATE (PF) 100 MCG/2ML IJ SOLN
25.0000 ug | INTRAMUSCULAR | Status: AC
  Administered 2016-04-05: 25 ug via INTRAVENOUS

## 2016-04-05 MED ORDER — MIDAZOLAM HCL 2 MG/2ML IJ SOLN: 1.0000 mg | INTRAMUSCULAR | Status: DC | PRN

## 2016-04-05 MED ORDER — LACTATED RINGERS IV SOLN
INTRAVENOUS | Status: DC
  Administered 2016-04-05: 11:00:00 via INTRAVENOUS

## 2016-04-05 MED ORDER — PROPOFOL 10 MG/ML IV BOLUS
INTRAVENOUS | Status: AC
  Filled 2016-04-05: qty 40

## 2016-04-05 MED ORDER — FENTANYL CITRATE (PF) 100 MCG/2ML IJ SOLN
INTRAMUSCULAR | Status: AC
  Filled 2016-04-05: qty 2

## 2016-04-05 MED ORDER — MIDAZOLAM HCL 5 MG/5ML IJ SOLN
INTRAMUSCULAR | Status: DC | PRN
  Administered 2016-04-05: 2 mg via INTRAVENOUS

## 2016-04-05 MED ORDER — ONDANSETRON HCL 4 MG/2ML IJ SOLN: 4.0000 mg | Freq: Once | INTRAMUSCULAR | Status: DC | PRN

## 2016-04-05 NOTE — Progress Notes (Signed)
Electrical Cardioversion Procedure Note Ricardo Garrett [id number] [birth year]  Procedure: Electrical Cardioversion Indications:  Atrial Fibrillation  Procedure Details Consent: Direct Current Cardioversion with propfol Time Out: Verified patient identification, verified procedure, site/side was marked, verified correct patient position, special equipment/implants available, medications/allergies/relevent history reviewed, required imaging and test results available.  Time out @ 1053  Patient placed on cardiac monitor, pulse oximetry, supplemental oxygen as necessary.  Sedation given:per anesthesia . Pads placed @ 1053 by Dr. Johnsie Cancel.  Cardioverted with 150 jules one time. To obtain a sinus bradycardia Cardioverted at 1055  Evaluation Findings: Post procedure EKG shows: Sinus Bradycardia with sinus arrhythmia Complications: none Patient did tolerate procedure well.   Ricardo Garrett 04/05/2016, 11:11 AM

## 2016-04-05 NOTE — Interval H&P Note (Signed)
History and Physical Interval Note:  04/05/2016 8:15 AM  Ricardo Garrett  has presented today for surgery, with the diagnosis of a-fib  The various methods of treatment have been discussed with the patient and family. After consideration of risks, benefits and other options for treatment, the patient has consented to  Procedure(s): CARDIOVERSION (N/A) as a surgical intervention .  The patient's history has been reviewed, patient examined, no change in status, stable for surgery.  I have reviewed the patient's chart and labs.  Questions were answered to the patient's satisfaction.     [name]

## 2016-04-05 NOTE — CV Procedure (Signed)
[place]: Propofol Anesthesia 2 mg versed 25 ug fentanlyl  On Rx NOAC with no missed doses Pre afib rate 75-85 [place] x 1 biphasic 150 Joules  Converted to NSR ate 52  No immediate neurologic sequelae  [name]

## 2016-04-05 NOTE — H&P (View-Only) (Signed)
Cardiology Office Note   Date:  03/14/2016   ID:  Ricardo Garrett, DOB 1945/12/09, MRN [id number]  PCP:  Wende Neighbors, MD  Cardiologist: Lamar Sprinkles, NP   Chief Complaint  Patient presents with  . Atrial Fibrillation      History of Present Illness: Ricardo Garrett is a 70 y.o. male who presents for ongoing assessment and management of systolic CHF, history of anasarca, rapid atrial fib, chronic lower extremity edema. Mild renal insufficiency. He was last seen by Dr.Nishan on 02/22/2016. He had recently been admitted for decompensated CHF.he is being planned for a DCCV in June based upon last office visit. He was also referred to primary care and renal for ongoing management of renal insufficiency.he is also being followed by [facility], and was seen on 03/08/2016, at that point he again 3 pounds overnight rising from 147 -157 pounds. He was asymptomatic. He was medically compliant. He offers no complaints of lower extremity edema. He continues to struggle little with salt.  He has been scheduled for cardioversion on May 31 with Dr.Nishan. He has his preoperative evaluation paperwork to include labs which are going to be completed tomorrow. He denies any rapid heart rhythm bleeding issues or bruising.  Past Medical History  Diagnosis Date  . High cholesterol   . Broken arm   . COPD (chronic obstructive pulmonary disease) ([place])   . Hypertension   . Oxygen deficiency     Past Surgical History  Procedure Laterality Date  . Tonsillectomy    . Skin graft    . Compression hip screw Right 03/15/2013    Procedure: COMPRESSION HIP;  Surgeon: Sanjuana Kava, MD;  Location: [facility];  Service: Orthopedics;  Laterality: Right;     Current Outpatient Prescriptions  Medication Sig Dispense Refill  . albuterol (PROVENTIL HFA;VENTOLIN HFA) 108 (90 Base) MCG/ACT inhaler Inhale 2 puffs into the lungs every 6 (six) hours as needed for wheezing or shortness of breath.    .  alfuzosin (UROXATRAL) 10 MG 24 hr tablet Take 1 tablet (10 mg total) by mouth daily with breakfast. 30 tablet 1  . apixaban (ELIQUIS) 5 MG TABS tablet Take 1 tablet (5 mg total) by mouth 2 (two) times daily. 60 tablet 1  . atorvastatin (LIPITOR) 20 MG tablet Take 20 mg by mouth daily. Reported on 02/04/2016    . digoxin (LANOXIN) 0.125 MG tablet Take 1 tablet (0.125 mg total) by mouth daily. 30 tablet 1  . furosemide (LASIX) 40 MG tablet Take 40 mg by mouth daily.    . metoprolol succinate (TOPROL XL) 25 MG 24 hr tablet Take 1 tablet (25 mg total) by mouth 2 (two) times daily after a meal. 180 tablet 1  . potassium chloride (K-DUR,KLOR-CON) 10 MEQ tablet Take 1 tablet (10 mEq total) by mouth daily. 90 tablet 3  . BREO ELLIPTA 200-25 MCG/INH AEPB Inhale 1 puff into the lungs daily. Reported on 03/14/2016    . SPIRIVA HANDIHALER 18 MCG inhalation capsule Reported on 03/14/2016     No current facility-administered medications for this visit.    Allergies:   Review of patient's allergies indicates no known allergies.    Social History:  The patient  reports that he quit smoking about 36 years ago. He does not have any smokeless tobacco history on file. He reports that he drinks alcohol. He reports that he uses illicit drugs (Marijuana).   Family History:  The patient's family history is not on file.  ROS: All other systems are reviewed and negative. Unless otherwise mentioned in H&P    PHYSICAL EXAM: VS:  BP 106/64 mmHg  Pulse 72  Ht 5\' 8"  (1.727 m)  Wt 157 lb (71.215 kg)  BMI 23.88 kg/m2  SpO2 96% , BMI Body mass index is 23.88 kg/(m^2). GEN: Well nourished, well developed, in no acute distress HEENT: normal Neck: no JVD, carotid bruits, or masses Cardiac: IRRR; no murmurs, rubs, or gallops,no edema  Respiratory:  Clear to auscultation bilaterally, normal work of breathing GI: soft, nontender, nondistended, + BS MS: no deformity or atrophymild puffiness in his ankles bilaterally, no  pitting edema Skin: warm and dry, no rash Neuro:  Strength and sensation are intact Psych: euthymic mood, full affect   Recent Labs: 01/20/2016: TSH 3.998 02/12/2016: ALT 41 03/09/2016: BUN 21; Creat 1.20*; Hemoglobin 14.8; Platelets 239; Potassium 5.4*; Sodium 135    Lipid Panel    Component Value Date/Time   CHOL 193 02/12/2016   TRIG 196 02/12/2016   HDL 29 02/12/2016   CHOLHDL 6.7 02/12/2016   VLDL 39 02/12/2016   LDLCALC 125 02/12/2016      Wt Readings from Last 3 Encounters:  03/14/16 157 lb (71.215 kg)  03/08/16 150 lb 2 oz (68.096 kg)  02/25/16 147 lb (66.679 kg)     ASSESSMENT AND PLAN:  1. Atrial fibrillation: heart rate is currently well-controlled. He denies any symptoms of dizziness, rapid heart rhythm, or bleeding or bruising issues. The patient will present for cardioversion with Dr.Nishan as scheduled.no medication adjustments at this time  2. Diastolic CHF: I have reviewed his weights on their recording sheet. He has fluctuated up 4-5 pounds and then loses 1-2 pounds. In our scale he is up 7 pounds from the ninth. He has no evidence of fluid retention on exam. He is to take an extra dose of 20 mg of Lasix if he were to gain an additional 2 pounds over the next couple days. This is been also instructed through [facility].  3. Hypertension: blood pressure is soft. He is asymptomatic. Will follow   Current medicines are reviewed at length with the patient today.    Labs/ tests ordered today include: No orders of the defined types were placed in this encounter.     Disposition:   FU with Dr. Johnsie Cancel in 1 month Signed, [name], NP  03/14/2016 1:40 PM    [place] [place]. [address], [place], [place] [address] Phone: [phone]; Fax: [phone]

## 2016-04-05 NOTE — Addendum Note (Signed)
Addendum  created 04/05/16 1110 by Mickel Baas, CRNA   Modules edited: Charges VN

## 2016-04-05 NOTE — Transfer of Care (Signed)
Immediate Anesthesia Transfer of Care Note  Patient: Ricardo Garrett  Procedure(s) Performed: Procedure(s): CARDIOVERSION (N/A)  Patient Location: Short Stay  Anesthesia Type:MAC  Level of Consciousness: awake, alert , oriented and patient cooperative  Airway & Oxygen Therapy: Patient Spontanous Breathing  Post-op Assessment: Report given to RN and Post -op Vital signs reviewed and stable  Post vital signs: Reviewed and stable  Last Vitals:  Filed Vitals:   04/05/16 0952  BP: 135/77  Pulse: 71  Temp: 36.3 C  Resp: 18    Last Pain: There were no vitals filed for this visit.    Patients Stated Pain Goal: 5 ([id number])  Complications: No apparent anesthesia complications

## 2016-04-05 NOTE — Discharge Instructions (Addendum)
Monitored Anesthesia Care °Monitored anesthesia care is an anesthesia service for a medical procedure. Anesthesia is the loss of the ability to feel pain. It is produced by medicines called anesthetics. It may affect a small area of your body (local anesthesia), a large area of your body (regional anesthesia), or your entire body (general anesthesia). The need for monitored anesthesia care depends your procedure, your condition, and the potential need for regional or general anesthesia. It is often provided during procedures where:  °· General anesthesia may be needed if there are complications. This is because you need special care when you are under general anesthesia.   °· You will be under local or regional anesthesia. This is so that you are able to have higher levels of anesthesia if needed.   °· You will receive calming medicines (sedatives). This is especially the case if sedatives are given to put you in a semi-conscious state of relaxation (deep sedation). This is because the amount of sedative needed to produce this state can be hard to predict. Too much of a sedative can produce general anesthesia. °Monitored anesthesia care is performed by one or more health care providers who have special training in all types of anesthesia. You will need to meet with these health care providers before your procedure. During this meeting, they will ask you about your medical history. They will also give you instructions to follow. (For example, you will need to stop eating and drinking before your procedure. You may also need to stop or change medicines you are taking.) During your procedure, your health care providers will stay with you. They will:  °· Watch your condition. This includes watching your blood pressure, breathing, and level of pain.   °· Diagnose and treat problems that occur.   °· Give medicines if they are needed. These may include calming medicines (sedatives) and anesthetics.   °· Make sure you are  comfortable.   °Having monitored anesthesia care does not necessarily mean that you will be under anesthesia. It does mean that your health care providers will be able to manage anesthesia if you need it or if it occurs. It also means that you will be able to have a different type of anesthesia than you are having if you need it. When your procedure is complete, your health care providers will continue to watch your condition. They will make sure any medicines wear off before you are allowed to go home.  °  °This information is not intended to replace advice given to you by your health care provider. Make sure you discuss any questions you have with your health care provider. °  °Document Released: 07/13/2005 Document Revised: 11/07/2014 Document Reviewed: 11/28/2012 °Elsevier Interactive Patient Education ©2016 Elsevier Inc[employer] °Electrical Cardioversion, Care After °Refer to this sheet in the next few weeks. These instructions provide you with information on caring for yourself after your procedure. Your health care provider may also give you more specific instructions. Your treatment has been planned according to current medical practices, but problems sometimes occur. Call your health care provider if you have any problems or questions after your procedure. °WHAT TO EXPECT AFTER THE PROCEDURE °After your procedure, it is typical to have the following sensations: °· Some redness on the skin where the shocks were delivered. If this is tender, a sunburn lotion or hydrocortisone cream may help. °· Possible return of an abnormal heart rhythm within hours or days after the procedure. °HOME CARE INSTRUCTIONS °· Take medicines only as directed by your health care provider.   Be sure you understand how and when to take your medicine. °· Learn how to feel your pulse and check it often. °· Limit your activity for 48 hours after the procedure or as directed by your health care provider. °· Avoid or minimize caffeine and other  stimulants as directed by your health care provider. °SEEK MEDICAL CARE IF: °· You feel like your heart is beating too fast or your pulse is not regular. °· You have any questions about your medicines. °· You have bleeding that will not stop. °SEEK IMMEDIATE MEDICAL CARE IF: °· You are dizzy or feel faint. °· It is hard to breathe or you feel short of breath. °· There is a change in discomfort in your chest. °· Your speech is slurred or you have trouble moving an arm or leg on one side of your body. °· You get a serious muscle cramp that does not go away. °· Your fingers or toes turn cold or blue. °  °This information is not intended to replace advice given to you by your health care provider. Make sure you discuss any questions you have with your health care provider. °  °Document Released: 08/07/2013 Document Revised: 11/07/2014 Document Reviewed: 08/07/2013 °Elsevier Interactive Patient Education ©2016 Elsevier Inc. ° °

## 2016-04-05 NOTE — Anesthesia Postprocedure Evaluation (Signed)
Anesthesia Post Note  Patient: Ricardo Garrett  Procedure(s) Performed: Procedure(s) (LRB): CARDIOVERSION (N/A)  Patient location during evaluation: Short Stay Anesthesia Type: MAC Level of consciousness: awake and oriented Pain management: pain level controlled Vital Signs Assessment: post-procedure vital signs reviewed and stable Respiratory status: spontaneous breathing and patient connected to face mask oxygen Cardiovascular status: stable Postop Assessment: no signs of nausea or vomiting Anesthetic complications: no    Last Vitals:  Filed Vitals:   04/05/16 0952  BP: 135/77  Pulse: 71  Temp: 36.3 C  Resp: 18    Last Pain: There were no vitals filed for this visit.               ADAMS, AMY A

## 2016-04-05 NOTE — Anesthesia Preprocedure Evaluation (Signed)
Anesthesia Evaluation  Patient identified by MRN, date of birth, ID band Patient awake    Reviewed: Allergy & Precautions, NPO status , Patient's Chart, lab work & pertinent test results  Airway Mallampati: I  TM Distance: >3 FB     Dental  (+) Edentulous Upper, Edentulous Lower   Pulmonary COPD, former smoker,    breath sounds clear to auscultation       Cardiovascular hypertension, Pt. on medications +CHF  + dysrhythmias Atrial Fibrillation  Rhythm:Irregular Rate:Normal     Neuro/Psych PSYCHIATRIC DISORDERS    GI/Hepatic negative GI ROS,   Endo/Other    Renal/GU      Musculoskeletal   Abdominal   Peds  Hematology   Anesthesia Other Findings   Reproductive/Obstetrics                             Anesthesia Physical Anesthesia Plan  ASA: III  Anesthesia Plan: MAC   Post-op Pain Management:    Induction: Intravenous  Airway Management Planned: Simple Face Mask  Additional Equipment:   Intra-op Plan:   Post-operative Plan:   Informed Consent: I have reviewed the patients History and Physical, chart, labs and discussed the procedure including the risks, benefits and alternatives for the proposed anesthesia with the patient or authorized representative who has indicated his/her understanding and acceptance.     Plan Discussed with:   Anesthesia Plan Comments:         Anesthesia Quick Evaluation

## 2016-04-06 ENCOUNTER — Encounter (HOSPITAL_COMMUNITY): Payer: Self-pay | Admitting: Cardiovascular Disease

## 2016-04-08 ENCOUNTER — Other Ambulatory Visit: Payer: Self-pay | Admitting: Pharmacist

## 2016-04-08 NOTE — Patient Outreach (Signed)
[facility]) Care Management  04/08/2016  Ricardo Garrett 28-Aug-1946 [id number]  Follow-up phone call to patient for medication adherence.  Patient's spouse answered, is on consent form, and verified HIPAA details.    Patient's spouse reports she fills his pill planner one week at a time.  She states that filling pill planner is going okay and that patient has been taking his medications.    Spouse states that patient has appointments next week with urologist and nephrologist.    Spouse request a home visit to ensure she is filling pill planner correctly.    Plan:  Discussed with patient and spouse that [facility] Pharmacist can make a home visit to ensure pill planner is being filled correctly.  Previously discussed with patient and spouse that if things continue to go well with his medication adherence and pill planner filling, will need to begin to think about pharmacy case closure due to improvement patient has made with his medication compliance.   Home visit scheduled for next week.   Karrie Meres, PharmD, [place] [phone]

## 2016-04-11 ENCOUNTER — Ambulatory Visit: Payer: Self-pay | Admitting: *Deleted

## 2016-04-12 ENCOUNTER — Ambulatory Visit (INDEPENDENT_AMBULATORY_CARE_PROVIDER_SITE_OTHER): Payer: Commercial Managed Care - HMO | Admitting: Urology

## 2016-04-12 DIAGNOSIS — N401 Enlarged prostate with lower urinary tract symptoms: Secondary | ICD-10-CM

## 2016-04-13 ENCOUNTER — Other Ambulatory Visit: Payer: Self-pay | Admitting: Pharmacist

## 2016-04-14 ENCOUNTER — Ambulatory Visit (HOSPITAL_COMMUNITY)
Admission: RE | Admit: 2016-04-14 | Discharge: 2016-04-14 | Disposition: A | Discharge status: Home / Self Care | Payer: Commercial Managed Care - HMO | Source: Ambulatory Visit | Attending: Medical | Admitting: Medical

## 2016-04-14 DIAGNOSIS — N400 Enlarged prostate without lower urinary tract symptoms: Secondary | ICD-10-CM | POA: Insufficient documentation

## 2016-04-14 DIAGNOSIS — N183 Chronic kidney disease, stage 3 unspecified: Secondary | ICD-10-CM

## 2016-04-14 NOTE — Patient Outreach (Signed)
[facility]) Care Management  [place]   04/14/2016  Tiberius Romualdo 1946/10/01 [id number]  Subjective:  [facility] Pharmacist made a follow-up home visit to patient's place of residence on 04/13/16.  Patient's spouse was present per patient request.  His spouse continues to fill his pill planner and she wanted the pill planner reviewed.   Patient's spouse has appropriately filled the 7 day pill planner, with the exception of a discrepancy with metoprolol succinate.     Patient had refilled a prescription for naproxen 500 mg twice daily prn as well.    Patient and spouse asked about the indication for Eliquis and stated that patient is no longer taking his inhalers---they report Dr Luan Pulling discontinued them.    Patient is still weighing and checking his blood pressure every day and the values are being recorded by his spouse.    Objective:   Encounter Medications: Outpatient Encounter Prescriptions as of 04/13/2016  Medication Sig Note  . acetaminophen (TYLENOL) 500 MG tablet Take 1,000 mg by mouth daily. Reported on 04/14/2016   . alfuzosin (UROXATRAL) 10 MG 24 hr tablet Take 1 tablet (10 mg total) by mouth daily with breakfast.   . apixaban (ELIQUIS) 5 MG TABS tablet Take 1 tablet (5 mg total) by mouth 2 (two) times daily.   Marland Kitchen atorvastatin (LIPITOR) 20 MG tablet Take 20 mg by mouth daily. Reported on 02/04/2016   . digoxin (LANOXIN) 0.125 MG tablet Take 1 tablet (0.125 mg total) by mouth daily.   . furosemide (LASIX) 40 MG tablet Take 40 mg by mouth daily.   . metoprolol succinate (TOPROL XL) 25 MG 24 hr tablet Take 1 tablet (25 mg total) by mouth 2 (two) times daily after a meal. 04/14/2016: Patient is presently take 1 tab (25 mg) in the am and 1/2 tab (12.5 mg) in the pm.    . potassium chloride (K-DUR,KLOR-CON) 10 MEQ tablet Take 1 tablet (10 mEq total) by mouth daily.   Marland Kitchen albuterol (PROVENTIL HFA;VENTOLIN HFA) 108 (90 Base) MCG/ACT inhaler Inhale 2 puffs into the  lungs every 6 (six) hours as needed for wheezing or shortness of breath. Reported on 04/14/2016   . BREO ELLIPTA 200-25 MCG/INH AEPB Inhale 1 puff into the lungs daily. Reported on 04/14/2016 03/22/2016: Patient states he is noncompliant with his inhalers.   . metoprolol tartrate (LOPRESSOR) 25 MG tablet Take 12.5 mg by mouth 2 (two) times daily. Reported on 04/14/2016   . SPIRIVA HANDIHALER 18 MCG inhalation capsule Place 18 mcg into inhaler and inhale daily. Reported on 04/14/2016 03/22/2016: Patient states he is noncompliant with his inhalers.    No facility-administered encounter medications on file as of 04/13/2016.    Functional Status: In your present state of health, do you have any difficulty performing the following activities: 03/30/2016 02/17/2016  Hearing? Y N  Vision? N N  Difficulty concentrating or making decisions? Y N  Walking or climbing stairs? N N  Dressing or bathing? Y N  Doing errands, shopping? N N  Preparing Food and eating ? - N  Using the Toilet? - N  In the past six months, have you accidently leaked urine? - N  Do you have problems with loss of bowel control? - N  Managing your Medications? - N  Managing your Finances? - N  Housekeeping or managing your Housekeeping? - Y    Fall/Depression Screening: PHQ 2/9 Scores 02/17/2016  PHQ - 2 Score 0    Assessment:  Per pill planner,  patient appears adherent to his medications.  There is a dose discrepancy with his metoprolol succinate. The bottle reads 25 mg 1 tab twice daily and his active medication list in this chart reads 1/2 tab twice daily.  There is also a previous med note that states 1/2 tab daily reportedly from his PCP.    His spouse reports patient is presently taking metoprolol succinate 25 mg tabs---1 tab in the am and 1/2 in the pm.   Per patient's blood pressure log his blood pressures are as follows:  04/13/16--93/66, pulse 82 04/12/16---105/66, pulse 81 04/11/16---118/90, pulse 85 04/10/16---88/54,  pulse 66 04/09/16---92/61, pulse 65 04/08/16---84/52, pulse 70  Patient was counseled on increased risk of bleeding if he takes naproxen in combination with Eliquis.  Patient was counseled that Eliquis is being used to reduce the risk of stroke given his history of atrial fibrillation.    Plan:  Patient's spouse is appropriately filling his pill planner.    Metoprolol succinate dose discrepancy needs to be clarified, a call was placed to patient's PCP, Dr Wende Neighbors to request clarification on dose they have for patient.    Patient and spouse are aware [facility] Pharmacist will call them back once dose is clarified with his PCP and or cardiology office.    Discussed with patient and spouse that given patient's progression to taking his medications and spouse appropriately filling his pill planner, would anticipate pharmacy case closure in the near future.    Karrie Meres, PharmD, [place] [phone]

## 2016-04-15 ENCOUNTER — Other Ambulatory Visit: Payer: Self-pay | Admitting: *Deleted

## 2016-04-15 DIAGNOSIS — I5021 Acute systolic (congestive) heart failure: Secondary | ICD-10-CM

## 2016-04-15 NOTE — Patient Outreach (Signed)
[facility]) Care Management  [place]  04/15/2016   Ricardo Garrett 27-Sep-1946 [id number]    Subjective: "I'm doing great! I'm just about done with all these doctors!"  Objective: Ricardo Garrett is an 70 y.o. male wiith COPD, pulmonary nodule, and CKD stage 3. He presented to his cardiology office on 01/18/16 with anasarca, atelectasis and consolidation on chest xray, and afib with RVR. He was consequently sent to [facility] for direct admission and treated for COPD exacerbation, afib with RVR, and urinary retention., Ricardo Garrett also experienced confusion during his hospitalization which was thought to be related to medication side effect.   At hospital discharge, Ricardo Garrett was referred to [place] Management for transition of care and follow up of chronic disease management and care coordination.   Ricardo Garrett was provided with a digital scale, an electronic blood pressure monitor, and a blue [facility] Care Management notebook with bp, pulse, and weight documentation logs. In depth education was provided for CHF and HTN disease management. Thorough medication review with patient and spouse was completed and Pharmacy consult was made with several pharmacy home visits.  Ricardo Garrett and his wife have been very receptive to education and have worked diligently on self monitoring and adherence to provider appointments.    Encounter Medications:  Outpatient Encounter Prescriptions as of 04/15/2016  Medication Sig Note  . acetaminophen (TYLENOL) 500 MG tablet Take 1,000 mg by mouth daily. Reported on 04/14/2016   . albuterol (PROVENTIL HFA;VENTOLIN HFA) 108 (90 Base) MCG/ACT inhaler Inhale 2 puffs into the lungs every 6 (six) hours as needed for wheezing or shortness of breath. Reported on 04/14/2016   . alfuzosin (UROXATRAL) 10 MG 24 hr tablet Take 1 tablet (10 mg total) by mouth daily with breakfast.   . apixaban (ELIQUIS) 5 MG TABS tablet Take 1 tablet (5 mg total)  by mouth 2 (two) times daily.   Marland Kitchen atorvastatin (LIPITOR) 20 MG tablet Take 20 mg by mouth daily. Reported on 02/04/2016   . BREO ELLIPTA 200-25 MCG/INH AEPB Inhale 1 puff into the lungs daily. Reported on 04/14/2016 03/22/2016: Patient states he is noncompliant with his inhalers.   . digoxin (LANOXIN) 0.125 MG tablet Take 1 tablet (0.125 mg total) by mouth daily.   . furosemide (LASIX) 40 MG tablet Take 40 mg by mouth daily.   . metoprolol succinate (TOPROL XL) 25 MG 24 hr tablet Take 1 tablet (25 mg total) by mouth 2 (two) times daily after a meal. 04/14/2016: Patient is presently take 1 tab (25 mg) in the am and 1/2 tab (12.5 mg) in the pm.    . metoprolol tartrate (LOPRESSOR) 25 MG tablet Take 12.5 mg by mouth 2 (two) times daily. Reported on 04/14/2016   . potassium chloride (K-DUR,KLOR-CON) 10 MEQ tablet Take 1 tablet (10 mEq total) by mouth daily.   Marland Kitchen SPIRIVA HANDIHALER 18 MCG inhalation capsule Place 18 mcg into inhaler and inhale daily. Reported on 04/14/2016 03/22/2016: Patient states he is noncompliant with his inhalers.     Functional Status:  In your present state of health, do you have any difficulty performing the following activities: 03/30/2016 02/17/2016  Hearing? Y N  Vision? N N  Difficulty concentrating or making decisions? Y N  Walking or climbing stairs? N N  Dressing or bathing? Y N  Doing errands, shopping? N N  Preparing Food and eating ? - N  Using the Toilet? - N  In the past six months, have you  accidently leaked urine? - N  Do you have problems with loss of bowel control? - N  Managing your Medications? - N  Managing your Finances? - N  Housekeeping or managing your Housekeeping? - Y    Fall/Depression Screening: PHQ 2/9 Scores 02/17/2016  PHQ - 2 Score 0    Assessment: Very pleasant 70 year old gentleman living in [place], [place] with his wife and 2 dogs and 1 cat in his home. Ricardo Garrett was hospitalized on 01/20/16 with Anasarca/CHF/COPD Exacerbation. He has made  tremendous strides in overall physical progress and self health management.   Chronic Health Condition (Afib) - Ricardo Garrett had DCCV on 04/02/16 and had un uneventful procedure and post-procedure period. He is scheduled to see [name] NP at [facility] on May 05, 2016 for follow up.   Pain Management concerns - Ricardo Garrett has history of intertrochanteric fracture of right hip for which he has been taking Naprosyn daily until he was started on Eliquis; Ricardo Garrett states his pain is not as well controlled off the Naprosyn and taking ES Tylenol as instructed by Dr. Nevada Crane but he says it is manageable and does not interfere with his daily activities.   Chronic Health condition (hypotension) - Ricardo Garrett's blood pressure was quite soft right after hospital discharge; his lopressor and lasix doses have been decreased to once daily and bp's have improved as have symptoms; Ricardo Garrett has been checking and recording his bp and pulse daily. He was most recently advised about medication management related to this issue by [name] PharmD.   Chronic Health Condition (CHF) - Ricardo Garrett is taking Lasix as prescribed; he is weighing and recording daily and can tell me signs and symptoms of worsening CHF although I have to help him with these signs. I believe he would benefit from ongoing telephonic health coaching for CHF   Medication Non-Adherence and Management needs - Ricardo Garrett's understanding of and adherence to his medication regimen has improved drastically. [facility] Care Management Pharmacist Karrie Meres and I have worked closely with Ricardo Garrett and his wife and they are now using a pill box to avoid confusion about daily administration of medications.   Chronic Health Condition (CKD) - Ricardo Garrett is being followed by Dr. Lowanda Foster, nephrology. Because of Ricardo Garrett's edema as a result of CHF and nephrotic syndrome, he was referred to nephrology. His CR baseline is around 2.  24H urine protein was very elevated at 3858. Ricardo Garrett is scheduled for an US kidneys in office with Dr. Lowanda Foster later this month.   Plan:   Mr. Sebree will check his bp and pulse and weight and record daily, calling for findings outside established parameters (weight gain of 3# overnight or 5# in a week).   Mr. Sphar will call for new or worsened symptoms.   With Mr. Jha permission and agreement to the plan of care, I will refer him to our telephonic health coaching program for ongoing oversight and education related to his CHF and self health management.    [place] Management  [phone]

## 2016-04-18 ENCOUNTER — Other Ambulatory Visit: Payer: Self-pay | Admitting: Pharmacist

## 2016-04-18 NOTE — Patient Outreach (Signed)
No return call received from PCP office regarding metoprolol succinate dose clarification from last week.    Placed a second call to PCP office today and left a message requesting a call back.    Will continue to attempt to get clarification on metoprolol succinate dosing for patient.    Karrie Meres, PharmD, [place] [phone]

## 2016-04-21 ENCOUNTER — Other Ambulatory Visit: Payer: Self-pay

## 2016-04-21 ENCOUNTER — Telehealth: Payer: Self-pay

## 2016-04-21 ENCOUNTER — Other Ambulatory Visit (HOSPITAL_COMMUNITY): Payer: Self-pay

## 2016-04-21 NOTE — Telephone Encounter (Signed)
-----   Message from Lin Givens, [facility] sent at 04/21/2016 11:48 AM EDT ----- Regarding: RE: med question Thanks, I listened to your voicemail. Alisa, the [facility] RN who works in [employer] is going to swing by Dr Juel Burrow office for me to see why they haven't call me back.   I am very confused as well as where all the changes are coming from.    Thanks for trying and for your time.  If I can't get clarification from PCP I will message Dr Johnsie Cancel.   ----- Message -----    From: Bernita Raisin, RN    Sent: 04/21/2016  11:36 AM      To: Lin Givens, RPH Subject: RE: med question                               Hi Lennette Bihari  I left a vm on your phone.I see what you mean,I have no idea who that person was that prescribed the tartrate.Dr Juel Burrow office is usually good about calling you back.You could shoot Dr Johnsie Cancel a staff message since he did his cardioversion.He can tell you what he wants. ----- Message -----    From: Lin Givens, RPH    Sent: 04/21/2016  11:11 AM      To: Bernita Raisin, RN Subject: med question                                   Hi Catherine:  I am looking for some help with Mr Regas on his metoprolol succinate dosing.    Saw him at home on 04/14/16 and he was taking metoprolol succinate 25 mg in morning and 12.5 mg in evening.  His med list has metoprolol succinate 25 mg bid and metoprolol tartrate 12.5 mg bid on it.  They state PCP had at one point lowered dose to 12.5 mg daily.    Tried to call PCP and left message twice with no call back.  He has appointment with Arnold Long, NP on 05/05/16.    My note from 6/15 has some of his BP recordings in it.    Can you all help clarify what he should be doing?   Thanks for anything you can do.   Karrie Meres, PharmD, [place] [phone]

## 2016-04-22 LAB — BASIC METABOLIC PANEL
BUN: 23 mg/dL (ref 7–25)
CHLORIDE: 104 mmol/L (ref 98–110)
CO2: 26 mmol/L (ref 20–31)
Calcium: 9.1 mg/dL (ref 8.6–10.3)
Creat: 1.27 mg/dL — ABNORMAL HIGH (ref 0.70–1.18)
GLUCOSE: 107 mg/dL — AB (ref 65–99)
POTASSIUM: 4.9 mmol/L (ref 3.5–5.3)
SODIUM: 137 mmol/L (ref 135–146)

## 2016-04-26 ENCOUNTER — Ambulatory Visit: Admit: 2016-04-26 | Payer: Self-pay | Admitting: Ophthalmology

## 2016-04-26 DIAGNOSIS — I4891 Unspecified atrial fibrillation: Secondary | ICD-10-CM | POA: Insufficient documentation

## 2016-04-26 DIAGNOSIS — I10 Essential (primary) hypertension: Secondary | ICD-10-CM | POA: Insufficient documentation

## 2016-04-26 SURGERY — PHACOEMULSIFICATION, CATARACT, WITH IOL INSERTION
Anesthesia: Monitor Anesthesia Care | Laterality: Left

## 2016-04-27 ENCOUNTER — Other Ambulatory Visit: Payer: Self-pay

## 2016-04-27 NOTE — Patient Outreach (Signed)
[facility]) Care Management  04/27/2016  Ricardo Garrett 1946/01/16 [id number]  Telephone call to patient for introductory call.  Explained to patient health coach and role.  Patient receptive to call but a little hard of hearing. Patient gives permission to speak with his wife due to his issues with hearing on the phone.  Plan: RN Health Coach will contact patient within one month and patient agrees to next outreach.  Jone Baseman, RN, MSN [place] [phone]

## 2016-05-05 ENCOUNTER — Encounter: Payer: Self-pay | Admitting: Pharmacist

## 2016-05-05 ENCOUNTER — Encounter: Payer: Self-pay | Admitting: Adult Health

## 2016-05-05 ENCOUNTER — Ambulatory Visit (INDEPENDENT_AMBULATORY_CARE_PROVIDER_SITE_OTHER): Payer: Commercial Managed Care - HMO | Admitting: Adult Health

## 2016-05-05 VITALS — BP 108/76 | HR 63 | Ht 68.0 in | Wt 161.0 lb

## 2016-05-05 DIAGNOSIS — I480 Paroxysmal atrial fibrillation: Secondary | ICD-10-CM

## 2016-05-05 DIAGNOSIS — I10 Essential (primary) hypertension: Secondary | ICD-10-CM

## 2016-05-05 MED ORDER — METOPROLOL SUCCINATE ER 25 MG PO TB24: ORAL_TABLET | ORAL | Status: DC

## 2016-05-05 NOTE — Patient Instructions (Signed)
Your physician recommends that you schedule a follow-up appointment in: 2 Months  Your physician has recommended you make the following change in your medication:    Toprol XL Take 12.5 mg ( 1/2 Tablet) in the AM and 25 mg ( 1 Tablet ) in The PM.   If you need a refill on your cardiac medications before your next appointment, please call your pharmacy.  Thank you for choosing [place]!

## 2016-05-05 NOTE — Progress Notes (Signed)
Name: Ricardo Garrett    DOB: Dec 22, 1945  Age: 70 y.o.  MR#: [id number]       PCP:  Wende Neighbors, MD      Insurance: Payor: [email or web address] MEDICARE / Plan: [place] THN/NTSP / Product Type: *No Product type* /   CC:   No chief complaint on file.   VS Filed Vitals:   05/05/16 1414  BP: 108/76  Pulse: 63  Height: 5\' 8"  (1.727 m)  Weight: 161 lb (73.029 kg)  SpO2: 99%    Weights Current Weight  05/05/16 161 lb (73.029 kg)  03/30/16 158 lb (71.668 kg)  03/24/16 154 lb 2 oz (69.911 kg)    Blood Pressure  BP Readings from Last 3 Encounters:  05/05/16 108/76  04/05/16 143/91  03/30/16 123/85     Admit date:  (Not on file) Last encounter with RMR:  03/14/2016   Allergy Review of patient's allergies indicates no known allergies.  Current Outpatient Prescriptions  Medication Sig Dispense Refill  . acetaminophen (TYLENOL) 500 MG tablet Take 1,000 mg by mouth daily. Reported on 04/14/2016    . alfuzosin (UROXATRAL) 10 MG 24 hr tablet Take 1 tablet (10 mg total) by mouth daily with breakfast. 30 tablet 1  . apixaban (ELIQUIS) 5 MG TABS tablet Take 1 tablet (5 mg total) by mouth 2 (two) times daily. 60 tablet 6  . atorvastatin (LIPITOR) 20 MG tablet Take 20 mg by mouth daily. Reported on 02/04/2016    . digoxin (LANOXIN) 0.125 MG tablet Take 1 tablet (0.125 mg total) by mouth daily. 30 tablet 6  . furosemide (LASIX) 40 MG tablet Take 40 mg by mouth daily.    Marland Kitchen losartan (COZAAR) 50 MG tablet Take 50 mg by mouth daily.     . metoprolol succinate (TOPROL XL) 25 MG 24 hr tablet Take 1 tablet (25 mg total) by mouth 2 (two) times daily after a meal. 180 tablet 1  . potassium chloride (K-DUR,KLOR-CON) 10 MEQ tablet Take 1 tablet (10 mEq total) by mouth daily. 90 tablet 3   No current facility-administered medications for this visit.    Discontinued Meds:    Medications Discontinued During This Encounter  Medication Reason  . SPIRIVA HANDIHALER 18 MCG inhalation capsule Error  . metoprolol  tartrate (LOPRESSOR) 25 MG tablet Error  . BREO ELLIPTA 200-25 MCG/INH AEPB Error  . albuterol (PROVENTIL HFA;VENTOLIN HFA) 108 (90 Base) MCG/ACT inhaler Error    Patient Active Problem List   Diagnosis Date Noted  . Acute systolic CHF (congestive heart failure) ([place]) 01/21/2016  . Pleural effusion on right   . Atrial fibrillation with rapid ventricular response ([place]) 01/20/2016  . Anxiety disorder 01/19/2016  . COPD (chronic obstructive pulmonary disease) ([place]) 01/19/2016  . Anasarca 01/19/2016  . CKD (chronic kidney disease) stage 3, GFR 30-59 ml/min 01/19/2016  . Hypoalbuminemia 01/19/2016  . Hip pain 04/29/2013  . Difficulty in walking(719.7) 04/29/2013  . Dermatitis 03/26/2013  . Other and unspecified hyperlipidemia 03/19/2013  . Unspecified constipation 03/19/2013  . Arrhythmia 03/19/2013  . Hyponatremia 03/19/2013  . Anticoagulated 03/19/2013  . Acute blood loss anemia 03/16/2013  . Intertrochanteric fracture of right hip (HCC) 03/14/2013    LABS    Component Value Date/Time   NA 137 04/21/2016 0838   NA 135 03/30/2016 1110   NA 135 03/09/2016 0900   NA 136 02/12/2016 1100   K 4.9 04/21/2016 0838   K 4.2 03/30/2016 1110   K 5.4* 03/09/2016 0900   K  5.6 02/12/2016 1100   CL 104 04/21/2016 0838   CL 103 03/30/2016 1110   CL 101 03/09/2016 0900   CL 97 02/12/2016 1100   CO2 26 04/21/2016 0838   CO2 24 03/30/2016 1110   CO2 27 03/09/2016 0900   CO2 26 02/12/2016 1100   GLUCOSE 107* 04/21/2016 0838   GLUCOSE 143* 03/30/2016 1110   GLUCOSE 105* 03/09/2016 0900   BUN 23 04/21/2016 0838   BUN 34* 03/30/2016 1110   BUN 21 03/09/2016 0900   BUN 34 02/12/2016 1100   CREATININE 1.27* 04/21/2016 0838   CREATININE 1.51* 03/30/2016 1110   CREATININE 1.20* 03/09/2016 0900   CREATININE 1.21 01/26/2016 0625   CREATININE 1.17 01/25/2016 0529   CALCIUM 9.1 04/21/2016 0838   CALCIUM 8.8* 03/30/2016 1110   CALCIUM 9.1 03/09/2016 0900   CALCIUM 9.0 02/12/2016 1100    GFRNONAA 45* 03/30/2016 1110   GFRNONAA 58* 01/26/2016 0625   GFRNONAA >60 01/25/2016 0529   GFRAA 52* 03/30/2016 1110   GFRAA >60 01/26/2016 0625   GFRAA >60 01/25/2016 0529   CMP     Component Value Date/Time   NA 137 04/21/2016 0838   NA 136 02/12/2016 1100   K 4.9 04/21/2016 0838   K 5.6 02/12/2016 1100   CL 104 04/21/2016 0838   CL 97 02/12/2016 1100   CO2 26 04/21/2016 0838   CO2 26 02/12/2016 1100   GLUCOSE 107* 04/21/2016 0838   BUN 23 04/21/2016 0838   BUN 34 02/12/2016 1100   CREATININE 1.27* 04/21/2016 0838   CREATININE 1.51* 03/30/2016 1110   CALCIUM 9.1 04/21/2016 0838   CALCIUM 9.0 02/12/2016 1100   PROT 5.8 02/12/2016 1100   PROT 4.8* 01/21/2016 1052   ALBUMIN 3.2 02/12/2016 1100   ALBUMIN 2.6* 01/21/2016 1052   AST 27 02/12/2016 1100   AST 21 01/21/2016 1052   ALT 41 02/12/2016 1100   ALT 22 01/21/2016 1052   ALKPHOS 155 02/12/2016 1100   ALKPHOS 126 01/21/2016 1052   BILITOT 0.4 02/12/2016 1100   BILITOT 1.2 01/21/2016 1052   GFRNONAA 45* 03/30/2016 1110   GFRAA 52* 03/30/2016 1110       Component Value Date/Time   WBC 7.9 03/30/2016 1110   WBC 8.5 03/09/2016 0900   WBC 8.7 02/12/2016 1100   HGB 14.3 03/30/2016 1110   HGB 14.8 03/09/2016 0900   HGB 14.9 01/26/2016 0625   HCT 43.6 03/30/2016 1110   HCT 45.1 03/09/2016 0900   HCT 48 02/12/2016 1100   HCT 44.8 01/26/2016 0625   MCV 90.1 03/30/2016 1110   MCV 90.4 03/09/2016 0900   MCV 89.2 02/12/2016 1100   MCV 90.1 01/26/2016 0625    Lipid Panel     Component Value Date/Time   CHOL 193 02/12/2016   TRIG 196 02/12/2016   HDL 29 02/12/2016   CHOLHDL 6.7 02/12/2016   VLDL 39 02/12/2016   LDLCALC 125 02/12/2016    ABG    Component Value Date/Time   PHART 7.407 01/23/2016 1910   PCO2ART 49.8* 01/23/2016 1910   PO2ART 85.2 01/23/2016 1910   HCO3 29.1* 01/23/2016 1910   O2SAT 95.4 01/23/2016 1910     Lab Results  Component Value Date   TSH 3.998 01/20/2016   BNP (last 3  results) No results for input(s): BNP in the last 8760 hours.  ProBNP (last 3 results) No results for input(s): PROBNP in the last 8760 hours.  Cardiac Panel (last 3 results) No results for input(s):  CKTOTAL, CKMB, TROPONINI, RELINDX in the last 72 hours.  Iron/TIBC/Ferritin/ %Sat No results found for: IRON, TIBC, FERRITIN, IRONPCTSAT   EKG Orders placed or performed during the hospital encounter of 04/05/16  . EKG 12-Lead  . EKG 12-Lead  . EKG 12-Lead  . EKG 12-Lead  . EKG 12-Lead  . EKG 12-Lead  . EKG     Prior Assessment and Plan Problem List as of 05/05/2016      Cardiovascular and Mediastinum   Arrhythmia   Atrial fibrillation with rapid ventricular response (HCC)   Acute systolic CHF (congestive heart failure) (HCC)     Respiratory   COPD (chronic obstructive pulmonary disease) (HCC)   Pleural effusion on right     Digestive   Unspecified constipation     Musculoskeletal and Integument   Intertrochanteric fracture of right hip (HCC)   Dermatitis     Genitourinary   CKD (chronic kidney disease) stage 3, GFR 30-59 ml/min     Other   Acute blood loss anemia   Other and unspecified hyperlipidemia   Hyponatremia   Anticoagulated   Hip pain   Difficulty in walking(719.7)   Anxiety disorder   Anasarca   Hypoalbuminemia       Imaging: US Renal  04/14/2016  CLINICAL DATA:  Chronic renal disease. EXAM: RENAL / URINARY TRACT ULTRASOUND COMPLETE COMPARISON:  None. FINDINGS: Right Kidney: Length: 10.8 cm. Echogenicity within normal limits. No mass or hydronephrosis visualized. Left Kidney: Length: 10.8 cm. Echogenicity within normal limits. No mass or hydronephrosis visualized. Bladder: Appears normal for degree of bladder distention. Prostate is enlarged at 4.5 x 3.0 x 2.7 cm. Prostate protrudes into the bladder. IMPRESSION: 1. Prostatic enlargement. 2. Exam otherwise unremarkable . Electronically Signed   By: Marcello Moores  Register   On: 04/14/2016 09:37

## 2016-05-05 NOTE — Progress Notes (Signed)
Cardiology Office Note   Date:  05/05/2016   ID:  Ricardo Garrett, DOB 02-21-1946, MRN [id number]  PCP:  Wende Neighbors, MD  Cardiologist:  Lamar Sprinkles, NP   No chief complaint on file.     History of Present Illness: Ricardo Garrett is a 70 y.o. male who presents for ongoing assessment and management of systolic CHF, history of anasarca, rapid atrial fib, chronic lower extremity edema. Mild renal insufficiency. He was scheduled for DCCV with Dr. Johnsie Cancel. This was competed on 04/05/2016 and was converted to NSR with 150 j by hasic cardioversion   He comes today complaining of fatigue, otherwise he has not had any recurrence of rapid heart rhythm dyspnea or chest discomfort. He is not complaining of any bleeding issues. He has been medically compliant. Most recent labs were completed on 04/21/2016. Sodium 137, potassium 4.9, chloride 104, CO2 26, BUN 34, creatinine 1.51.   Past Medical History  Diagnosis Date  . High cholesterol   . Broken arm   . COPD (chronic obstructive pulmonary disease) ([place])   . Hypertension   . Oxygen deficiency   . HOH (hard of hearing)   . CHF (congestive heart failure) ([place])   . Arthritis     Past Surgical History  Procedure Laterality Date  . Tonsillectomy    . Skin graft Left     from right thigh  . Compression hip screw Right 03/15/2013    Procedure: COMPRESSION HIP;  Surgeon: Sanjuana Kava, MD;  Location: [facility];  Service: Orthopedics;  Laterality: Right;  . Cardioversion N/A 04/05/2016    Procedure: CARDIOVERSION;  Surgeon: Josue Hector, MD;  Location: [facility] END[facility] SUITE;  Service: Cardiovascular;  Laterality: N/A;     Current Outpatient Prescriptions  Medication Sig Dispense Refill  . acetaminophen (TYLENOL) 500 MG tablet Take 1,000 mg by mouth daily. Reported on 04/14/2016    . alfuzosin (UROXATRAL) 10 MG 24 hr tablet Take 1 tablet (10 mg total) by mouth daily with breakfast. 30 tablet 1  . apixaban (ELIQUIS) 5 MG TABS tablet Take 1 tablet (5 mg  total) by mouth 2 (two) times daily. 60 tablet 6  . atorvastatin (LIPITOR) 20 MG tablet Take 20 mg by mouth daily. Reported on 02/04/2016    . digoxin (LANOXIN) 0.125 MG tablet Take 1 tablet (0.125 mg total) by mouth daily. 30 tablet 6  . furosemide (LASIX) 40 MG tablet Take 40 mg by mouth daily.    Marland Kitchen losartan (COZAAR) 50 MG tablet Take 50 mg by mouth daily.     . potassium chloride (K-DUR,KLOR-CON) 10 MEQ tablet Take 1 tablet (10 mEq total) by mouth daily. 90 tablet 3  . metoprolol succinate (TOPROL-XL) 25 MG 24 hr tablet Take 12.5 mg (1/2 Tablet) In The AM  and Take 25 mg ( 1 Tablet) in The PM 135 tablet 3   No current facility-administered medications for this visit.    Allergies:   Review of patient's allergies indicates no known allergies.    Social History:  The patient  reports that he quit smoking about 36 years ago. His smoking use included Cigarettes. He has a 20 pack-year smoking history. He does not have any smokeless tobacco history on file. He reports that he drinks alcohol. He reports that he uses illicit drugs (Marijuana).   Family History:  The patient's family history is not on file.    ROS: All other systems are reviewed and negative. Unless otherwise mentioned in H&P    PHYSICAL EXAM:  VS:  BP 108/76 mmHg  Pulse 63  Ht 5\' 8"  (1.727 m)  Wt 161 lb (73.029 kg)  BMI 24.49 kg/m2  SpO2 99% , BMI Body mass index is 24.49 kg/(m^2). GEN: Well nourished, well developed, in no acute distress HEENT: normal Neck: no JVD, carotid bruits, or masses Cardiac: RRR; no murmurs, rubs, or gallops,no edema  Respiratory:  clear to auscultation bilaterally, normal work of breathing GI: soft, nontender, nondistended, + BS MS: no deformity or atrophy Skin: warm and dry, no rash Neuro:  Strength and sensation are intact Psych: euthymic mood, full affect   Recent Labs: 01/20/2016: TSH 3.998 02/12/2016: ALT 41 03/30/2016: Hemoglobin 14.3; Platelets 266 04/21/2016: BUN 23; Creat 1.27*;  Potassium 4.9; Sodium 137    Lipid Panel    Component Value Date/Time   CHOL 193 02/12/2016   TRIG 196 02/12/2016   HDL 29 02/12/2016   CHOLHDL 6.7 02/12/2016   VLDL 39 02/12/2016   LDLCALC 125 02/12/2016      Wt Readings from Last 3 Encounters:  05/05/16 161 lb (73.029 kg)  03/30/16 158 lb (71.668 kg)  03/24/16 154 lb 2 oz (69.911 kg)     ASSESSMENT AND PLAN:  1. Atrial fibrillation: Heart rate is well-controlled and remains regular. He is tolerating metoprolol digoxin and ELIQUIS as directed without side effects other than fatigue. He is currently taking 25 mg of metoprolol in the morning and 12 point 5 in the evening. Due to fatigue I am changing it to 20 5 at night and 12 point 5 in the morning. Hopefully changing the dosage times will be helpful to his fatigue. If necessary, we can change to a lower to [place] and make it long acting on followup. He will need to continue fibrillation therapy for a minimum of 6 months.  2. Hypertension: soft today. We'll continue to follow. May need to reduce dose of losartan. We'll not make any further changes until followup.   Current medicines are reviewed at length with the patient today.    Labs/ tests ordered today include:  No orders of the defined types were placed in this encounter.     Disposition:   FU with 2 months  Signed, [name], NP  05/05/2016 2:51 PM    [place] [address] [place],  [address] Phone: [phone]; Fax: [phone]

## 2016-05-06 ENCOUNTER — Other Ambulatory Visit: Payer: Self-pay | Admitting: Pharmacist

## 2016-05-06 NOTE — Patient Outreach (Signed)
[facility]) Care Management  [place]   05/06/2016  Husam Hohn Nov 27, 1945 [id number]  Subjective:  Phone call to patient today, who had me speak with his spouse, [name], on [facility] Consent form, verified patient's name and date of birth, address.   Advised spouse that [facility] Pharmacist had sent a message to Arnold Long, NP with cardiology prior to his appointment to ask for clarification on metoprolol succinate dosing.   Spouse states that cardiology clarified how patient should take metoprolol succinate and spouse denied any questions.    She states that she has been filling patient's pill planner and denies difficulties at this time with filling pill planner.    Objective:   Current Medications: Current Outpatient Prescriptions  Medication Sig Dispense Refill  . acetaminophen (TYLENOL) 500 MG tablet Take 1,000 mg by mouth daily. Reported on 04/14/2016    . alfuzosin (UROXATRAL) 10 MG 24 hr tablet Take 1 tablet (10 mg total) by mouth daily with breakfast. 30 tablet 1  . apixaban (ELIQUIS) 5 MG TABS tablet Take 1 tablet (5 mg total) by mouth 2 (two) times daily. 60 tablet 6  . atorvastatin (LIPITOR) 20 MG tablet Take 20 mg by mouth daily. Reported on 02/04/2016    . digoxin (LANOXIN) 0.125 MG tablet Take 1 tablet (0.125 mg total) by mouth daily. 30 tablet 6  . furosemide (LASIX) 40 MG tablet Take 40 mg by mouth daily.    Marland Kitchen losartan (COZAAR) 50 MG tablet Take 50 mg by mouth daily.     . metoprolol succinate (TOPROL-XL) 25 MG 24 hr tablet Take 12.5 mg (1/2 Tablet) In The AM  and Take 25 mg ( 1 Tablet) in The PM 135 tablet 3  . potassium chloride (K-DUR,KLOR-CON) 10 MEQ tablet Take 1 tablet (10 mEq total) by mouth daily. 90 tablet 3   No current facility-administered medications for this visit.    Functional Status: In your present state of health, do you have any difficulty performing the following activities: 03/30/2016 02/17/2016  Hearing? Y N  Vision? N N   Difficulty concentrating or making decisions? Y N  Walking or climbing stairs? N N  Dressing or bathing? Y N  Doing errands, shopping? N N  Preparing Food and eating ? - N  Using the Toilet? - N  In the past six months, have you accidently leaked urine? - N  Do you have problems with loss of bowel control? - N  Managing your Medications? - N  Managing your Finances? - N  Housekeeping or managing your Housekeeping? - Y    Fall/Depression Screening: PHQ 2/9 Scores 02/17/2016  PHQ - 2 Score 0    Assessment:  Patient has been more compliant with his medications and spouse has been filling pill planner.  At last pharmacist home visit, pill planner was filled correctly except for metoprolol succinate dose discrepancy which was clarified by Arnold Long, NP with cardiology at patient's appointment on 05/05/16 and spouse denies any other questions/concerns or pharmacy needs at this time.    Plan:  Patient is active with [place] [facility]---will close out pharmacy case at this time due to needs being met.    Spouse confirmed she has [facility] pharmacist phone number if needed.    Will inbasket [place], to make her aware of pharmacy case closure at this time.   Karrie Meres, PharmD, [place] [phone]

## 2016-05-26 ENCOUNTER — Other Ambulatory Visit: Payer: Self-pay

## 2016-05-26 NOTE — Patient Outreach (Signed)
[place]) Care Management  05/26/2016  Mourice Calmes 04-Dec-1945 [id number]   Telephone call to patient for initial assessment. No answer.  HIPPA compliant voice message left.    Plan: RN Health Coach will attempt patient within the month of August.    Doneshia Hill J Adaliah Hiegel, RN, MSN [place] [phone]

## 2016-06-02 ENCOUNTER — Ambulatory Visit: Payer: Self-pay

## 2016-06-02 ENCOUNTER — Other Ambulatory Visit: Payer: Self-pay

## 2016-06-02 NOTE — Progress Notes (Signed)
This encounter was created in error - please disregard.

## 2016-06-02 NOTE — Patient Outreach (Signed)
[facility]) Care Management  [place]  06/02/2016   Ricardo Garrett May 22, 1946 [id number]  Subjective: Telephone call for initial assessment patient not in. Spoke with patient's wife as patient has given prior permission to speak with wife.  She is concerned as patient is having terrible pain in his right leg/ankle area.  She states she called Dr. Juel Garrett office and has not received any feedback. Advised wife that I would give them a call to have them reach out to her.  She verbalized understanding. Discussed with wife heart failure zones and importance of patient continuing to weigh and eat a low sodium diet.  She verbalized understanding.   Objective:   Encounter Medications:  Outpatient Encounter Prescriptions as of 06/02/2016  Medication Sig Note  . acetaminophen (TYLENOL) 500 MG tablet Take 1,000 mg by mouth daily. Reported on 04/14/2016   . alfuzosin (UROXATRAL) 10 MG 24 hr tablet Take 1 tablet (10 mg total) by mouth daily with breakfast.   . apixaban (ELIQUIS) 5 MG TABS tablet Take 1 tablet (5 mg total) by mouth 2 (two) times daily.   Marland Kitchen atorvastatin (LIPITOR) 20 MG tablet Take 20 mg by mouth daily. Reported on 02/04/2016   . digoxin (LANOXIN) 0.125 MG tablet Take 1 tablet (0.125 mg total) by mouth daily.   . furosemide (LASIX) 40 MG tablet Take 40 mg by mouth daily.   Marland Kitchen losartan (COZAAR) 50 MG tablet Take 50 mg by mouth daily.  05/05/2016: Received from: External Pharmacy  . metoprolol succinate (TOPROL-XL) 25 MG 24 hr tablet Take 12.5 mg (1/2 Tablet) In The AM  and Take 25 mg ( 1 Tablet) in The PM   . potassium chloride (K-DUR,KLOR-CON) 10 MEQ tablet Take 1 tablet (10 mEq total) by mouth daily.    No facility-administered encounter medications on file as of 06/02/2016.     Functional Status:  In your present state of health, do you have any difficulty performing the following activities: 06/02/2016 03/30/2016  Hearing? Ricardo Garrett  Vision? N N  Difficulty concentrating or  making decisions? N Y  Walking or climbing stairs? N N  Dressing or bathing? N Y  Doing errands, shopping? N N  Preparing Food and eating ? N -  Using the Toilet? N -  In the past six months, have you accidently leaked urine? N -  Do you have problems with loss of bowel control? N -  Managing your Medications? N -  Managing your Finances? N -  Housekeeping or managing your Housekeeping? Y -  Some recent data might be hidden    Fall/Depression Screening: PHQ 2/9 Scores 06/02/2016 02/17/2016  PHQ - 2 Score 0 0    Assessment: Patient/spouse will benefit from health coach outreach for continued education and support for disease management.    Plan:  [facility] CM Care Plan Problem One   Flowsheet Row Most Recent Value  Care Plan Problem One  Knowledge Deficit related to heart failure and prescribed plan of care.  Role Documenting the Problem One  [place] for Problem One  Active  THN Long Term Goal (31-90 days)  Patient/spouse will verbalize continued understanding of heart failure zones for management of heart failure within 90 days  THN Long Term Goal Start Date  06/02/16  Interventions for Problem One Long Term Goal  RN Health reviewed heart failure zone chart and when to notify doctor.  Also discussed the importance of low sodium diet and daily weights.  RN Health Coach will provide ongoing education for patient/spouse on heart failure through phone calls. RN Health Coach sent welcome letter.  RN Health Coach will send initial barriers letter, assessment, and care plan to primary care physician.  RN Health Coach will contact patient/spouse in the month of September patient/spouse agrees to next contact.   Jone Baseman, RN, MSN [place] [phone]

## 2016-06-02 NOTE — Patient Outreach (Signed)
Ricardo Garrett [facility]) Care Management  06/02/2016  Ricardo Garrett Mar 03, 1946 [id number]   Telephone call to Dr. Juel Burrow office to express need to follow up with patient/wife on pain patient is having.  Office to reach out to patient/wife today.    Ricardo Baseman, RN, MSN [place] [phone]

## 2016-07-06 ENCOUNTER — Other Ambulatory Visit: Payer: Self-pay

## 2016-07-06 NOTE — Patient Outreach (Signed)
[facility]) Care Management  [place]  07/06/2016   Ta Jeronimo 11/08/1945 [id number]  Subjective: Telephone call to spouse for monthly call.  She reports that patient is doing good.  She reports that patient has some problems with right ankle pain at times and has pain medication he takes only when he really needs it.  She reports that his weight has done fine.  Reinforced heart failure zone chart and when to notify physician.  No concerns.     Objective:   Encounter Medications:  Outpatient Encounter Prescriptions as of 07/06/2016  Medication Sig Note  . acetaminophen (TYLENOL) 500 MG tablet Take 1,000 mg by mouth daily. Reported on 04/14/2016   . alfuzosin (UROXATRAL) 10 MG 24 hr tablet Take 1 tablet (10 mg total) by mouth daily with breakfast.   . apixaban (ELIQUIS) 5 MG TABS tablet Take 1 tablet (5 mg total) by mouth 2 (two) times daily.   Marland Kitchen atorvastatin (LIPITOR) 20 MG tablet Take 20 mg by mouth daily. Reported on 02/04/2016   . digoxin (LANOXIN) 0.125 MG tablet Take 1 tablet (0.125 mg total) by mouth daily.   . furosemide (LASIX) 40 MG tablet Take 40 mg by mouth daily.   Marland Kitchen losartan (COZAAR) 50 MG tablet Take 50 mg by mouth daily.  05/05/2016: Received from: External Pharmacy  . metoprolol succinate (TOPROL-XL) 25 MG 24 hr tablet Take 12.5 mg (1/2 Tablet) In The AM  and Take 25 mg ( 1 Tablet) in The PM   . oxyCODONE-acetaminophen (PERCOCET) 7.5-325 MG tablet Take 1 tablet by mouth every 4 (four) hours as needed for severe pain.   . potassium chloride (K-DUR,KLOR-CON) 10 MEQ tablet Take 1 tablet (10 mEq total) by mouth daily.    No facility-administered encounter medications on file as of 07/06/2016.     Functional Status:  In your present state of health, do you have any difficulty performing the following activities: 06/02/2016 03/30/2016  Hearing? Tempie Donning  Vision? N N  Difficulty concentrating or making decisions? N Y  Walking or climbing stairs? N N  Dressing  or bathing? N Y  Doing errands, shopping? N N  Preparing Food and eating ? N -  Using the Toilet? N -  In the past six months, have you accidently leaked urine? N -  Do you have problems with loss of bowel control? N -  Managing your Medications? N -  Managing your Finances? N -  Housekeeping or managing your Housekeeping? Y -  Some recent data might be hidden    Fall/Depression Screening: PHQ 2/9 Scores 07/06/2016 06/02/2016 02/17/2016  PHQ - 2 Score 0 0 0    Assessment: Patient continues to benefit from health coach outreach for disease management and support.    Plan:  [facility] CM Care Plan Problem One   Flowsheet Row Most Recent Value  Care Plan Problem One  Knowledge Deficit related to heart failure and prescribed plan of care.  Role Documenting the Problem One  [place] for Problem One  Active  THN Long Term Goal (31-90 days)  Patient/spouse will verbalize continued understanding of heart failure zones for management of heart failure within 90 days  THN Long Term Goal Start Date  06/02/16  Interventions for Problem One Long Term Goal  RN Health reinforced heart failure zone chart and when to notify doctor.  Also discussed the importance of low sodium diet and daily weights.       RN Health  Coach will contact patient in the month of October and patient agrees to next outreach.  Jone Baseman, RN, MSN [place] [phone]

## 2016-07-07 ENCOUNTER — Ambulatory Visit: Payer: Self-pay

## 2016-07-07 ENCOUNTER — Encounter: Payer: Self-pay | Admitting: *Deleted

## 2016-07-07 ENCOUNTER — Ambulatory Visit (INDEPENDENT_AMBULATORY_CARE_PROVIDER_SITE_OTHER): Payer: Commercial Managed Care - HMO | Admitting: Adult Health

## 2016-07-07 ENCOUNTER — Encounter: Payer: Self-pay | Admitting: Adult Health

## 2016-07-07 VITALS — BP 130/68 | HR 53 | Ht 68.0 in | Wt 160.0 lb

## 2016-07-07 DIAGNOSIS — I482 Chronic atrial fibrillation: Secondary | ICD-10-CM | POA: Diagnosis not present

## 2016-07-07 DIAGNOSIS — I5022 Chronic systolic (congestive) heart failure: Secondary | ICD-10-CM

## 2016-07-07 DIAGNOSIS — I10 Essential (primary) hypertension: Secondary | ICD-10-CM

## 2016-07-07 DIAGNOSIS — I4821 Permanent atrial fibrillation: Secondary | ICD-10-CM

## 2016-07-07 NOTE — Progress Notes (Signed)
Name: Ricardo Garrett    DOB: 1946/06/23  Age: 70 y.o.  MR#: [id number]       PCP:  Wende Neighbors, MD      Insurance: Payor: [email or web address] MEDICARE / Plan: [place] THN/NTSP / Product Type: *No Product type* /   CC:   No chief complaint on file.   VS Vitals:   07/07/16 1329  Pulse: (!) 53  SpO2: 97%  Weight: 160 lb (72.6 kg)  Height: 5\' 8"  (1.727 m)    Weights Current Weight  07/07/16 160 lb (72.6 kg)  05/05/16 161 lb (73 kg)  03/30/16 158 lb (71.7 kg)    Blood Pressure  BP Readings from Last 3 Encounters:  05/05/16 108/76  04/05/16 (!) 143/91  03/30/16 123/85     Admit date:  (Not on file) Last encounter with RMR:  05/05/2016   Allergy Review of patient's allergies indicates no known allergies.  Current Outpatient Prescriptions  Medication Sig Dispense Refill  . acetaminophen (TYLENOL) 500 MG tablet Take 1,000 mg by mouth daily. Reported on 04/14/2016    . apixaban (ELIQUIS) 5 MG TABS tablet Take 1 tablet (5 mg total) by mouth 2 (two) times daily. 60 tablet 6  . atorvastatin (LIPITOR) 20 MG tablet Take 20 mg by mouth daily. Reported on 02/04/2016    . digoxin (LANOXIN) 0.125 MG tablet Take 1 tablet (0.125 mg total) by mouth daily. 30 tablet 6  . furosemide (LASIX) 40 MG tablet Take 40 mg by mouth daily.    Marland Kitchen losartan (COZAAR) 50 MG tablet Take 50 mg by mouth daily.     . metoprolol succinate (TOPROL-XL) 25 MG 24 hr tablet Take 12.5 mg (1/2 Tablet) In The AM  and Take 25 mg ( 1 Tablet) in The PM 135 tablet 3  . potassium chloride (K-DUR,KLOR-CON) 10 MEQ tablet Take 1 tablet (10 mEq total) by mouth daily. 90 tablet 3  . oxyCODONE-acetaminophen (PERCOCET) 7.5-325 MG tablet Take 1 tablet by mouth every 4 (four) hours as needed for severe pain.     No current facility-administered medications for this visit.     Discontinued Meds:    Medications Discontinued During This Encounter  Medication Reason  . alfuzosin (UROXATRAL) 10 MG 24 hr tablet Error    Patient Active  Problem List   Diagnosis Date Noted  . Acute systolic CHF (congestive heart failure) ([place]) 01/21/2016  . Pleural effusion on right   . Atrial fibrillation with rapid ventricular response ([place]) 01/20/2016  . Anxiety disorder 01/19/2016  . COPD (chronic obstructive pulmonary disease) ([employer]) 01/19/2016  . Anasarca 01/19/2016  . CKD (chronic kidney disease) stage 3, GFR 30-59 ml/min 01/19/2016  . Hypoalbuminemia 01/19/2016  . Hip pain 04/29/2013  . Difficulty in walking(719.7) 04/29/2013  . Dermatitis 03/26/2013  . Other and unspecified hyperlipidemia 03/19/2013  . Unspecified constipation 03/19/2013  . Arrhythmia 03/19/2013  . Hyponatremia 03/19/2013  . Anticoagulated 03/19/2013  . Acute blood loss anemia 03/16/2013  . Intertrochanteric fracture of right hip (HCC) 03/14/2013    LABS    Component Value Date/Time   NA 137 04/21/2016 0838   NA 135 03/30/2016 1110   NA 135 03/09/2016 0900   NA 136 02/12/2016 1100   K 4.9 04/21/2016 0838   K 4.2 03/30/2016 1110   K 5.4 (H) 03/09/2016 0900   K 5.6 02/12/2016 1100   CL 104 04/21/2016 0838   CL 103 03/30/2016 1110   CL 101 03/09/2016 0900   CL 97 02/12/2016 1100  CO2 26 04/21/2016 0838   CO2 24 03/30/2016 1110   CO2 27 03/09/2016 0900   CO2 26 02/12/2016 1100   GLUCOSE 107 (H) 04/21/2016 0838   GLUCOSE 143 (H) 03/30/2016 1110   GLUCOSE 105 (H) 03/09/2016 0900   BUN 23 04/21/2016 0838   BUN 34 (H) 03/30/2016 1110   BUN 21 03/09/2016 0900   BUN 34 02/12/2016 1100   CREATININE 1.27 (H) 04/21/2016 0838   CREATININE 1.51 (H) 03/30/2016 1110   CREATININE 1.20 (H) 03/09/2016 0900   CREATININE 1.21 01/26/2016 0625   CREATININE 1.17 01/25/2016 0529   CALCIUM 9.1 04/21/2016 0838   CALCIUM 8.8 (L) 03/30/2016 1110   CALCIUM 9.1 03/09/2016 0900   CALCIUM 9.0 02/12/2016 1100   GFRNONAA 45 (L) 03/30/2016 1110   GFRNONAA 58 (L) 01/26/2016 0625   GFRNONAA >60 01/25/2016 0529   GFRAA 52 (L) 03/30/2016 1110   GFRAA >60 01/26/2016 0625    GFRAA >60 01/25/2016 0529   CMP     Component Value Date/Time   NA 137 04/21/2016 0838   NA 136 02/12/2016 1100   K 4.9 04/21/2016 0838   K 5.6 02/12/2016 1100   CL 104 04/21/2016 0838   CL 97 02/12/2016 1100   CO2 26 04/21/2016 0838   CO2 26 02/12/2016 1100   GLUCOSE 107 (H) 04/21/2016 0838   BUN 23 04/21/2016 0838   BUN 34 02/12/2016 1100   CREATININE 1.27 (H) 04/21/2016 0838   CALCIUM 9.1 04/21/2016 0838   CALCIUM 9.0 02/12/2016 1100   PROT 5.8 02/12/2016 1100   ALBUMIN 3.2 02/12/2016 1100   AST 27 02/12/2016 1100   ALT 41 02/12/2016 1100   ALKPHOS 155 02/12/2016 1100   BILITOT 0.4 02/12/2016 1100   GFRNONAA 45 (L) 03/30/2016 1110   GFRAA 52 (L) 03/30/2016 1110       Component Value Date/Time   WBC 7.9 03/30/2016 1110   WBC 8.5 03/09/2016 0900   WBC 8.7 02/12/2016 1100   HGB 14.3 03/30/2016 1110   HGB 14.8 03/09/2016 0900   HGB 14.9 01/26/2016 0625   HCT 43.6 03/30/2016 1110   HCT 45.1 03/09/2016 0900   HCT 48 02/12/2016 1100   HCT 44.8 01/26/2016 0625   MCV 90.1 03/30/2016 1110   MCV 90.4 03/09/2016 0900   MCV 89.2 02/12/2016 1100   MCV 90.1 01/26/2016 0625    Lipid Panel     Component Value Date/Time   CHOL 193 02/12/2016   TRIG 196 02/12/2016   HDL 29 02/12/2016   CHOLHDL 6.7 02/12/2016   VLDL 39 02/12/2016   LDLCALC 125 02/12/2016    ABG    Component Value Date/Time   PHART 7.407 01/23/2016 1910   PCO2ART 49.8 (H) 01/23/2016 1910   PO2ART 85.2 01/23/2016 1910   HCO3 29.1 (H) 01/23/2016 1910   O2SAT 95.4 01/23/2016 1910     Lab Results  Component Value Date   TSH 3.998 01/20/2016   BNP (last 3 results) No results for input(s): BNP in the last 8760 hours.  ProBNP (last 3 results) No results for input(s): PROBNP in the last 8760 hours.  Cardiac Panel (last 3 results) No results for input(s): CKTOTAL, CKMB, TROPONINI, RELINDX in the last 72 hours.  Iron/TIBC/Ferritin/ %Sat No results found for: IRON, TIBC, FERRITIN, IRONPCTSAT    EKG Orders placed or performed during the hospital encounter of 04/05/16  . EKG 12-Lead  . EKG 12-Lead  . EKG 12-Lead  . EKG 12-Lead  . EKG 12-Lead  .  EKG 12-Lead  . EKG     Prior Assessment and Plan Problem List as of 07/07/2016 Reviewed: 07/06/2016 10:36 AM by Jon Billings, RN     Cardiovascular and Mediastinum   Arrhythmia   Atrial fibrillation with rapid ventricular response (HCC)   Acute systolic CHF (congestive heart failure) (HCC)     Respiratory   COPD (chronic obstructive pulmonary disease) (HCC)   Pleural effusion on right     Digestive   Unspecified constipation     Musculoskeletal and Integument   Intertrochanteric fracture of right hip (HCC)   Dermatitis     Genitourinary   CKD (chronic kidney disease) stage 3, GFR 30-59 ml/min     Other   Acute blood loss anemia   Other and unspecified hyperlipidemia   Hyponatremia   Anticoagulated   Hip pain   Difficulty in walking(719.7)   Anxiety disorder   Anasarca   Hypoalbuminemia       Imaging: No results found.

## 2016-07-07 NOTE — Progress Notes (Signed)
Cardiology Office Note   Date:  07/07/2016   ID:  Ricardo Garrett, DOB 1946/06/25, MRN [id number]  PCP:  Wende Neighbors, MD  Cardiologist: Lamar Sprinkles, NP   Chief Complaint  Patient presents with  . Congestive Heart Failure  . Atrial Fibrillation      History of Present Illness: Ricardo Garrett is a 70 y.o. male who presents for ongoing assessment and management of systolic CHF, history of anasarca, rapid atrial fibrillation, chronic lower extremity edema, mild renal insufficiency. Patient had DCCV by Dr. Olive Bass and 04/05/2016 and was converted to normal sinus rhythm. He was last seen in the office on 05/05/2016 and was clinically stable with the exception of complaints of mild fatigue.   Because of his fatigue and changed his metoprolol to 25 mg at night and 12.5 mg in the a.m. This was in an effort to avoid daytime fatigue. He was continued on anticoagulation therapy. He was also found to have mildly soft blood pressure with consideration to decrease dose of losartan. We'll reevaluate on this office visit  He is here today without complaints. He denies chest pain dizziness or dyspnea. His main complaint is severe hearing loss. He has not followed by an ENT. He has recently had labs drawn by primary care physician. He has some mild occasional bleeding after bowel movement. Last labs completed in March which revealed some anemia. He has had follow-up labs which I'm requesting.  Past Medical History:  Diagnosis Date  . Arthritis   . Broken arm   . CHF (congestive heart failure) ([place])   . COPD (chronic obstructive pulmonary disease) ([place])   . High cholesterol   . HOH (hard of hearing)   . Hypertension   . Oxygen deficiency     Past Surgical History:  Procedure Laterality Date  . CARDIOVERSION N/A 04/05/2016   Procedure: CARDIOVERSION;  Surgeon: Josue Hector, MD;  Location: [facility] ENDO SUITE;  Service: Cardiovascular;  Laterality: N/A;  . COMPRESSION HIP SCREW Right 03/15/2013   Procedure: COMPRESSION HIP;  Surgeon: Sanjuana Kava, MD;  Location: [facility] ORS;  Service: Orthopedics;  Laterality: Right;  . SKIN GRAFT Left    from right thigh  . TONSILLECTOMY       Current Outpatient Prescriptions  Medication Sig Dispense Refill  . acetaminophen (TYLENOL) 500 MG tablet Take 1,000 mg by mouth daily. Reported on 04/14/2016    . apixaban (ELIQUIS) 5 MG TABS tablet Take 1 tablet (5 mg total) by mouth 2 (two) times daily. 60 tablet 6  . atorvastatin (LIPITOR) 20 MG tablet Take 20 mg by mouth daily. Reported on 02/04/2016    . digoxin (LANOXIN) 0.125 MG tablet Take 1 tablet (0.125 mg total) by mouth daily. 30 tablet 6  . furosemide (LASIX) 40 MG tablet Take 40 mg by mouth daily.    Marland Kitchen losartan (COZAAR) 50 MG tablet Take 50 mg by mouth daily.     . metoprolol succinate (TOPROL-XL) 25 MG 24 hr tablet Take 12.5 mg (1/2 Tablet) In The AM  and Take 25 mg ( 1 Tablet) in The PM 135 tablet 3  . potassium chloride (K-DUR,KLOR-CON) 10 MEQ tablet Take 1 tablet (10 mEq total) by mouth daily. 90 tablet 3  . oxyCODONE-acetaminophen (PERCOCET) 7.5-325 MG tablet Take 1 tablet by mouth every 4 (four) hours as needed for severe pain.     No current facility-administered medications for this visit.     Allergies:   Review of patient's allergies indicates no known allergies.  Social History:  The patient  reports that he quit smoking about 36 years ago. His smoking use included Cigarettes. He has a 20.00 pack-year smoking history. He has never used smokeless tobacco. He reports that he drinks alcohol. He reports that he uses drugs, including Marijuana.   Family History:  The patient's family history includes Diabetes in his mother; Heart attack in his father.    ROS: All other systems are reviewed and negative. Unless otherwise mentioned in H&P    PHYSICAL EXAM: VS:  BP 130/68   Pulse (!) 53   Ht 5\' 8"  (1.727 m)   Wt 160 lb (72.6 kg)   SpO2 97%   BMI 24.33 kg/m  , BMI Body mass index is  24.33 kg/m. GEN: Well nourished, well developed, in no acute distress  HEENT: normal  Neck: no JVD, carotid bruits, or masses Cardiac: IRRR; no murmurs, rubs, or gallops,no edema  Respiratory:  Clear to auscultation bilaterally, normal work of breathing GI: soft, nontender, nondistended, + BS MS: no deformity or atrophy  Skin: warm and dry, no rash Neuro:  Strength and sensation are intact Psych: euthymic mood, full affect   Recent Labs: 01/20/2016: TSH 3.998 02/12/2016: ALT 41 03/30/2016: Hemoglobin 14.3; Platelets 266 04/21/2016: BUN 23; Creat 1.27; Potassium 4.9; Sodium 137    Lipid Panel    Component Value Date/Time   CHOL 193 02/12/2016   TRIG 196 02/12/2016   HDL 29 02/12/2016   CHOLHDL 6.7 02/12/2016   VLDL 39 02/12/2016   LDLCALC 125 02/12/2016      Wt Readings from Last 3 Encounters:  07/07/16 160 lb (72.6 kg)  05/05/16 161 lb (73 kg)  03/30/16 158 lb (71.7 kg)     ASSESSMENT AND PLAN:  1. Atrial fibrillation: Heart rate is currently well-controlled, he has some complaints of some blood when he wipes himself after bowel movement. He was found to be anemic in the past with recent labs completed by his primary care physician Dr. Nevada Crane. I am requesting those labs. We'll keep current medication regimen at this time. No changes.  2. Systolic CHF:. Most recent echo March 2017 revealed EF of 35-40%. He refuses anymore testing at this time. He will need to have a follow-up echocardiogram on next visit. For now continue digoxin, Lasix 20 mg daily (different from listed at 40 mg daily, and losartan 50 mg daily along with metoprolol 12.5 mg in the morning and 25 mg in the evening. He is also on potassium replacement which should continue.  3. 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  Labs/ tests ordered today include: Requesting labs from primary care physician.   No orders of the defined types were placed in this encounter.    Disposition:   FU with 6 months Signed, [name], NP  07/07/2016 2:28 PM    [facility] [address] [place], [place] [address] Phone: [phone]; Fax: [phone]

## 2016-07-07 NOTE — Patient Instructions (Signed)
Your physician wants you to follow-up in: 6 Months with Dr. Nishan. You will receive a reminder letter in the mail two months in advance. If you don't receive a letter, please call our office to schedule the follow-up appointment.  Your physician recommends that you continue on your current medications as directed. Please refer to the Current Medication list given to you today.  If you need a refill on your cardiac medications before your next appointment, please call your pharmacy.  Thank you for choosing [facility]!   

## 2016-08-02 ENCOUNTER — Other Ambulatory Visit: Payer: Self-pay

## 2016-08-02 NOTE — Patient Outreach (Signed)
[facility]) Care Management  08/02/2016  Jamarien Muddiman [birth year] [id number]   Telephone call to patient for monthly call. No answer.  HIPAA compliant voice message left.   Plan: RN Health Coach will attempt again in the month of October.    Jone Baseman, RN, MSN [place] [phone]

## 2016-08-18 ENCOUNTER — Other Ambulatory Visit: Payer: Self-pay

## 2016-08-18 NOTE — Patient Outreach (Signed)
[facility]) Care Management  [place]  08/18/2016   Ricardo Garrett 01-20-1946 [id number]  Subjective: Telephone call to patient for monthly call. Spoke with patient and wife.  Patient still dealing with leg pain and takes tylenol as needed.  However, patient and wife states that it does not help and that patient was taking naproxen but due to being on blood thinner he cannot take it.  Patient asked did he really need the blood thinner.  Explained to patient in depth his atrial fibrillation and rationale for blood thinner and consequences for not taking it. He verbalized understanding. Discussed with patient and spouse heart failure zones and what to watch for and when to notify physician.  He verbalized understanding.    Objective:   Encounter Medications:  Outpatient Encounter Prescriptions as of 08/18/2016  Medication Sig Note  . acetaminophen (TYLENOL) 500 MG tablet Take 1,000 mg by mouth daily. Reported on 04/14/2016   . apixaban (ELIQUIS) 5 MG TABS tablet Take 1 tablet (5 mg total) by mouth 2 (two) times daily.   Marland Kitchen atorvastatin (LIPITOR) 20 MG tablet Take 20 mg by mouth daily. Reported on 02/04/2016   . digoxin (LANOXIN) 0.125 MG tablet Take 1 tablet (0.125 mg total) by mouth daily.   . furosemide (LASIX) 40 MG tablet Take 40 mg by mouth daily.   Marland Kitchen losartan (COZAAR) 50 MG tablet Take 50 mg by mouth daily.  05/05/2016: Received from: External Pharmacy  . metoprolol succinate (TOPROL-XL) 25 MG 24 hr tablet Take 12.5 mg (1/2 Tablet) In The AM  and Take 25 mg ( 1 Tablet) in The PM   . oxyCODONE-acetaminophen (PERCOCET) 7.5-325 MG tablet Take 1 tablet by mouth every 4 (four) hours as needed for severe pain.   . potassium chloride (K-DUR,KLOR-CON) 10 MEQ tablet Take 1 tablet (10 mEq total) by mouth daily.    No facility-administered encounter medications on file as of 08/18/2016.     Functional Status:  In your present state of health, do you have any difficulty  performing the following activities: 06/02/2016 03/30/2016  Hearing? Tempie Donning  Vision? N N  Difficulty concentrating or making decisions? N Y  Walking or climbing stairs? N N  Dressing or bathing? N Y  Doing errands, shopping? N N  Preparing Food and eating ? N -  Using the Toilet? N -  In the past six months, have you accidently leaked urine? N -  Do you have problems with loss of bowel control? N -  Managing your Medications? N -  Managing your Finances? N -  Housekeeping or managing your Housekeeping? Y -  Some recent data might be hidden    Fall/Depression Screening: PHQ 2/9 Scores 08/18/2016 07/06/2016 06/02/2016 02/17/2016  PHQ - 2 Score 0 0 0 0    Assessment: Patient continues to benefit from health coach outreach for disease management and support.   Plan:  [facility] CM Care Plan Problem One   Flowsheet Row Most Recent Value  Care Plan Problem One  Knowledge Deficit related to heart failure and prescribed plan of care.  Role Documenting the Problem One  [place] for Problem One  Active  THN Long Term Goal (31-90 days)  Patient/spouse will verbalize continued understanding of heart failure zones for management of heart failure within 90 days  THN Long Term Goal Start Date  08/18/16  Interventions for Problem One Long Term Goal  RN Health reinterated heart failure zone chart and when to  notify doctor.  Also discussed the importance of low sodium diet and daily weights.       RN Health Coach will contact patient in the month of November and patient agrees to next outreach.  Jone Baseman, RN, MSN [place] [phone]

## 2016-08-25 ENCOUNTER — Ambulatory Visit: Payer: Self-pay

## 2016-09-13 ENCOUNTER — Other Ambulatory Visit: Payer: Self-pay

## 2016-09-13 NOTE — Patient Outreach (Addendum)
[facility]) Care Management  [place]  09/13/2016   Ricardo Garrett 1946/03/17 [id number]  Subjective: Telephone call to patient for monthly call. Spoke with wife she is able to verify HIPAA.  She reports that patient is doing good. She reports he saw his primary doctor about 2 weeks ago.  She reports that he was told to take alleve by the doctor as needed for arthritis pain and it has seemed to help patient.  She reports that patient continues to monitor weights daily.  Discussed with wife heart failure action plan and when to notify physician.  She verbalized understanding. Discussed with wife moving calls to every other month as patient is doing good.  She is in agreement.    Objective:   Encounter Medications:  Outpatient Encounter Prescriptions as of 09/13/2016  Medication Sig Note  . acetaminophen (TYLENOL) 500 MG tablet Take 1,000 mg by mouth daily. Reported on 04/14/2016   . apixaban (ELIQUIS) 5 MG TABS tablet Take 1 tablet (5 mg total) by mouth 2 (two) times daily.   Marland Kitchen atorvastatin (LIPITOR) 20 MG tablet Take 20 mg by mouth daily. Reported on 02/04/2016   . digoxin (LANOXIN) 0.125 MG tablet Take 1 tablet (0.125 mg total) by mouth daily.   . furosemide (LASIX) 40 MG tablet Take 40 mg by mouth daily.   Marland Kitchen losartan (COZAAR) 50 MG tablet Take 50 mg by mouth daily.  05/05/2016: Received from: External Pharmacy  . metoprolol succinate (TOPROL-XL) 25 MG 24 hr tablet Take 12.5 mg (1/2 Tablet) In The AM  and Take 25 mg ( 1 Tablet) in The PM   . oxyCODONE-acetaminophen (PERCOCET) 7.5-325 MG tablet Take 1 tablet by mouth every 4 (four) hours as needed for severe pain.   . potassium chloride (K-DUR,KLOR-CON) 10 MEQ tablet Take 1 tablet (10 mEq total) by mouth daily.    No facility-administered encounter medications on file as of 09/13/2016.     Functional Status:  In your present state of health, do you have any difficulty performing the following activities: 06/02/2016  03/30/2016  Hearing? Tempie Donning  Vision? N N  Difficulty concentrating or making decisions? N Y  Walking or climbing stairs? N N  Dressing or bathing? N Y  Doing errands, shopping? N N  Preparing Food and eating ? N -  Using the Toilet? N -  In the past six months, have you accidently leaked urine? N -  Do you have problems with loss of bowel control? N -  Managing your Medications? N -  Managing your Finances? N -  Housekeeping or managing your Housekeeping? Y -  Some recent data might be hidden    Fall/Depression Screening: PHQ 2/9 Scores 09/13/2016 08/18/2016 07/06/2016 06/02/2016 02/17/2016  PHQ - 2 Score 0 0 0 0 0    Assessment: Patient continues to benefit from health coach outreach for disease management and support.    Plan:  [facility] CM Care Plan Problem One   Flowsheet Row Most Recent Value  Care Plan Problem One  Knowledge Deficit related to heart failure and prescribed plan of care.  Role Documenting the Problem One  [place] for Problem One  Active  [facility] Long Term Goal (31-90 days)  Patient/spouse will verbalize continued understanding of heart failure zones for management of heart failure within 90 days  THN Long Term Goal Start Date  09/13/16  Interventions for Problem One Long Term Goal  RN Health reviewed heart failure zone chart and  when to notify doctor.  Also discussed the importance of low sodium diet and daily weights.       RN Health Coach will contact patient/caregiver in the month of January and patient/cargiver agrees to next outreach.  Jone Baseman, RN, MSN [place] [phone]

## 2016-10-11 ENCOUNTER — Ambulatory Visit: Payer: Self-pay

## 2016-10-13 ENCOUNTER — Other Ambulatory Visit: Payer: Self-pay | Admitting: Adult Health

## 2016-11-04 ENCOUNTER — Other Ambulatory Visit: Payer: Self-pay | Admitting: Adult Health

## 2016-11-08 ENCOUNTER — Other Ambulatory Visit: Payer: Self-pay

## 2016-11-08 NOTE — Patient Outreach (Signed)
[facility] [place] [facility]) Care Management  11/08/2016  Ricardo Garrett Mar 01, 1946 [id number]   Telephone call to patient for every other month call.  Patient reports he is having problems hearing and will put wife on the phone also.  HIPAA verified.  Wife reports that patient is with [facility] now.  Advised patient and wife of [facility] not being a provider for [facility] and that I will need to close the case.  They both verbalized understanding.   Plan: RN Health Coach will send closure letter to patient and physician.  RN Health Coach will notify care management assistant of case closure.    Jone Baseman, RN, MSN [place] [phone]

## 2016-12-23 DIAGNOSIS — H6092 Unspecified otitis externa, left ear: Secondary | ICD-10-CM | POA: Diagnosis not present

## 2016-12-23 DIAGNOSIS — J060 Acute laryngopharyngitis: Secondary | ICD-10-CM | POA: Diagnosis not present

## 2016-12-23 DIAGNOSIS — I5032 Chronic diastolic (congestive) heart failure: Secondary | ICD-10-CM | POA: Diagnosis not present

## 2016-12-23 DIAGNOSIS — H9319 Tinnitus, unspecified ear: Secondary | ICD-10-CM | POA: Diagnosis not present

## 2016-12-23 DIAGNOSIS — J449 Chronic obstructive pulmonary disease, unspecified: Secondary | ICD-10-CM | POA: Diagnosis not present

## 2016-12-26 DIAGNOSIS — I959 Hypotension, unspecified: Secondary | ICD-10-CM | POA: Diagnosis not present

## 2016-12-26 DIAGNOSIS — N289 Disorder of kidney and ureter, unspecified: Secondary | ICD-10-CM | POA: Diagnosis not present

## 2016-12-26 DIAGNOSIS — E782 Mixed hyperlipidemia: Secondary | ICD-10-CM | POA: Diagnosis not present

## 2016-12-26 DIAGNOSIS — R7301 Impaired fasting glucose: Secondary | ICD-10-CM | POA: Diagnosis not present

## 2016-12-29 ENCOUNTER — Encounter: Payer: Self-pay | Admitting: Cardiovascular Disease

## 2017-01-04 DIAGNOSIS — R7301 Impaired fasting glucose: Secondary | ICD-10-CM | POA: Diagnosis not present

## 2017-01-04 DIAGNOSIS — I5032 Chronic diastolic (congestive) heart failure: Secondary | ICD-10-CM | POA: Diagnosis not present

## 2017-01-04 DIAGNOSIS — E782 Mixed hyperlipidemia: Secondary | ICD-10-CM | POA: Diagnosis not present

## 2017-01-04 DIAGNOSIS — N183 Chronic kidney disease, stage 3 (moderate): Secondary | ICD-10-CM | POA: Diagnosis not present

## 2017-01-04 DIAGNOSIS — Z0000 Encounter for general adult medical examination without abnormal findings: Secondary | ICD-10-CM | POA: Diagnosis not present

## 2017-01-04 DIAGNOSIS — I482 Chronic atrial fibrillation: Secondary | ICD-10-CM | POA: Diagnosis not present

## 2017-01-05 ENCOUNTER — Ambulatory Visit (INDEPENDENT_AMBULATORY_CARE_PROVIDER_SITE_OTHER): Payer: Medicare HMO | Admitting: Otolaryngology

## 2017-01-05 DIAGNOSIS — H903 Sensorineural hearing loss, bilateral: Secondary | ICD-10-CM | POA: Diagnosis not present

## 2017-02-01 ENCOUNTER — Ambulatory Visit: Payer: Self-pay | Admitting: Cardiovascular Disease

## 2017-02-01 NOTE — Progress Notes (Signed)
Cardiology Office Note   Date:  02/02/2017   ID:  Ricardo Garrett, DOB 1946/03/02, MRN [id number]  PCP:  Wende Neighbors, MD  Cardiologist: Oswaldo Conroy, MD   No chief complaint on file.     History of Present Illness: Ricardo Garrett is a 71 y.o. male who presents for ongoing assessment and management of systolic CHF, history of anasarca, rapid atrial fibrillation, chronic lower extremity edema, mild renal insufficiency. Patient had DCCV by me on 04/05/2016 and was converted to normal sinus rhythm.    Because of his fatigue and changed his metoprolol to 25 mg at night and 12.5 mg in the a.m. This was in an effort to avoid daytime fatigue. He was continued on anticoagulation therapy.   Has had issues with hearing loss ENT not helpful Hearing aids too expensive  Pain in right leg from old femur fracture Only thing that helps is naproxen No bleeding issues on eliquis   Past Medical History:  Diagnosis Date  . Arthritis   . Broken arm   . CHF (congestive heart failure) ([place])   . COPD (chronic obstructive pulmonary disease) ([place])   . High cholesterol   . HOH (hard of hearing)   . Hypertension   . Oxygen deficiency     Past Surgical History:  Procedure Laterality Date  . CARDIOVERSION N/A 04/05/2016   Procedure: CARDIOVERSION;  Surgeon: Josue Hector, MD;  [facility];  Service: Cardiovascular;  Laterality: N/A;  . COMPRESSION HIP SCREW Right 03/15/2013   Procedure: COMPRESSION HIP;  Surgeon: Sanjuana Kava, MD;  Location: [facility] ORS;  Service: Orthopedics;  Laterality: Right;  . SKIN GRAFT Left    from right thigh  . TONSILLECTOMY       Current Outpatient Prescriptions  Medication Sig Dispense Refill  . acetaminophen (TYLENOL) 500 MG tablet Take 1,000 mg by mouth daily. Reported on 04/14/2016    . atorvastatin (LIPITOR) 20 MG tablet Take 20 mg by mouth daily. Reported on 02/04/2016    . digoxin (LANOXIN) 0.125 MG tablet TAKE ONE TABLET BY MOUTH ONCE DAILY 90 tablet 2  .  ELIQUIS 5 MG TABS tablet TAKE ONE TABLET BY MOUTH TWICE DAILY 60 tablet 6  . furosemide (LASIX) 40 MG tablet Take 40 mg by mouth daily.    Marland Kitchen losartan (COZAAR) 50 MG tablet Take 50 mg by mouth daily.     . metoprolol succinate (TOPROL-XL) 25 MG 24 hr tablet Take 12.5 mg (1/2 Tablet) In The AM  and Take 25 mg ( 1 Tablet) in The PM 135 tablet 3  . oxyCODONE-acetaminophen (PERCOCET) 7.5-325 MG tablet Take 1 tablet by mouth every 4 (four) hours as needed for severe pain.    . potassium chloride (K-DUR,KLOR-CON) 10 MEQ tablet Take 1 tablet (10 mEq total) by mouth daily. 90 tablet 3   No current facility-administered medications for this visit.     Allergies:   Patient has no known allergies.    Social History:  The patient  reports that he quit smoking about 37 years ago. His smoking use included Cigarettes. He has a 20.00 pack-year smoking history. He has never used smokeless tobacco. He reports that he drinks alcohol. He reports that he uses drugs, including Marijuana.   Family History:  The patient's family history includes Diabetes in his mother; Heart attack in his father.    ROS: All other systems are reviewed and negative. Unless otherwise mentioned in H&P    PHYSICAL EXAM: VS:  BP 110/78  Pulse 73   Ht 5\' 8"  (1.727 m)   Wt 165 lb (74.8 kg)   SpO2 97%   BMI 25.09 kg/m  , BMI Body mass index is 25.09 kg/m. Affect appropriate Healthy:  appears stated age 22: hearing loss  Neck supple with no adenopathy JVP normal no bruits no thyromegaly Lungs clear with no wheezing and good diaphragmatic motion Heart:  S1/S2 no murmur, no rub, gallop or click PMI normal Abdomen: benighn, BS positve, no tenderness, no AAA no bruit.  No HSM or HJR Distal pulses intact with no bruits No edema Neuro non-focal Skin warm and dry No muscular weakness    Recent Labs: 02/12/2016: ALT 41 03/30/2016: Hemoglobin 14.3; Platelets 266 04/21/2016: BUN 23; Creat 1.27; Potassium 4.9; Sodium 137     Lipid Panel    Component Value Date/Time   CHOL 193 02/12/2016   TRIG 196 02/12/2016   HDL 29 02/12/2016   CHOLHDL 6.7 02/12/2016   VLDL 39 02/12/2016   LDLCALC 125 02/12/2016      Wt Readings from Last 3 Encounters:  02/02/17 165 lb (74.8 kg)  07/07/16 160 lb (72.6 kg)  05/05/16 161 lb (73 kg)     ECG:  04/06/16 AFib LvH nonspecific ST/T wave changes    ASSESSMENT AND PLAN:  1. Atrial fibrillation: maintaining  NSR continue beta blocker and eliquis   2. Systolic CHF:. Most recent echo March 2017 revealed EF of 35-40%. He refuses anymore testing at this time. For now continue digoxin, Lasix 20 mg daily (different from listed at 40 mg daily, and losartan 50 mg daily along with metoprolol 12.5 mg in the morning and 25 mg in the evening. He is also on potassium replacement which should continue. f/u echo if he will f/u   3. 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  4. Arthritis: only thing that works well is naproxen. Has old broken femur on right leg Despite mildly increased risk on elqiuis Will continue for quality of life   Labs/ tests ordered today include: Requesting labs from primary care physician.  No orders of the defined types were placed in this encounter.    Disposition:   FU with 6 months   [name]

## 2017-02-02 ENCOUNTER — Ambulatory Visit (INDEPENDENT_AMBULATORY_CARE_PROVIDER_SITE_OTHER): Payer: Medicare HMO | Admitting: Cardiovascular Disease

## 2017-02-02 ENCOUNTER — Encounter: Payer: Self-pay | Admitting: Cardiovascular Disease

## 2017-02-02 VITALS — BP 110/78 | HR 73 | Ht 68.0 in | Wt 165.0 lb

## 2017-02-02 DIAGNOSIS — I480 Paroxysmal atrial fibrillation: Secondary | ICD-10-CM

## 2017-02-02 NOTE — Patient Instructions (Signed)
Your physician wants you to follow-up in:  6 months Dr Blima Singer will receive a reminder letter in the mail two months in advance. If you don't receive a letter, please call our office to schedule the follow-up appointment.    Your physician recommends that you continue on your current medications as directed. Please refer to the Current Medication list given to you today.     If you need a refill on your cardiac medications before your next appointment, please call your pharmacy.     Thank you for choosing [place] !

## 2017-03-14 ENCOUNTER — Other Ambulatory Visit: Payer: Self-pay | Admitting: Cardiovascular Disease

## 2017-03-14 DIAGNOSIS — Z79899 Other long term (current) drug therapy: Secondary | ICD-10-CM

## 2017-03-14 DIAGNOSIS — E875 Hyperkalemia: Secondary | ICD-10-CM

## 2017-05-10 DIAGNOSIS — I959 Hypotension, unspecified: Secondary | ICD-10-CM | POA: Diagnosis not present

## 2017-05-10 DIAGNOSIS — E782 Mixed hyperlipidemia: Secondary | ICD-10-CM | POA: Diagnosis not present

## 2017-05-10 DIAGNOSIS — R7301 Impaired fasting glucose: Secondary | ICD-10-CM | POA: Diagnosis not present

## 2017-05-10 DIAGNOSIS — Z1159 Encounter for screening for other viral diseases: Secondary | ICD-10-CM | POA: Diagnosis not present

## 2017-05-12 DIAGNOSIS — J449 Chronic obstructive pulmonary disease, unspecified: Secondary | ICD-10-CM | POA: Diagnosis not present

## 2017-05-12 DIAGNOSIS — M79604 Pain in right leg: Secondary | ICD-10-CM | POA: Diagnosis not present

## 2017-05-12 DIAGNOSIS — N183 Chronic kidney disease, stage 3 (moderate): Secondary | ICD-10-CM | POA: Diagnosis not present

## 2017-05-12 DIAGNOSIS — E782 Mixed hyperlipidemia: Secondary | ICD-10-CM | POA: Diagnosis not present

## 2017-05-12 DIAGNOSIS — I5032 Chronic diastolic (congestive) heart failure: Secondary | ICD-10-CM | POA: Diagnosis not present

## 2017-05-12 DIAGNOSIS — R7301 Impaired fasting glucose: Secondary | ICD-10-CM | POA: Diagnosis not present

## 2017-06-05 ENCOUNTER — Other Ambulatory Visit: Payer: Self-pay | Admitting: Adult Health

## 2017-06-10 DIAGNOSIS — R69 Illness, unspecified: Secondary | ICD-10-CM | POA: Diagnosis not present

## 2017-06-29 ENCOUNTER — Other Ambulatory Visit: Payer: Self-pay | Admitting: Adult Health

## 2017-07-13 DIAGNOSIS — H6120 Impacted cerumen, unspecified ear: Secondary | ICD-10-CM | POA: Diagnosis not present

## 2017-07-16 DIAGNOSIS — R69 Illness, unspecified: Secondary | ICD-10-CM | POA: Diagnosis not present

## 2017-07-29 ENCOUNTER — Other Ambulatory Visit: Payer: Self-pay | Admitting: Adult Health

## 2017-08-10 DIAGNOSIS — E440 Moderate protein-calorie malnutrition: Secondary | ICD-10-CM | POA: Diagnosis not present

## 2017-08-10 DIAGNOSIS — E782 Mixed hyperlipidemia: Secondary | ICD-10-CM | POA: Diagnosis not present

## 2017-08-10 DIAGNOSIS — R7301 Impaired fasting glucose: Secondary | ICD-10-CM | POA: Diagnosis not present

## 2017-08-15 DIAGNOSIS — I5032 Chronic diastolic (congestive) heart failure: Secondary | ICD-10-CM | POA: Diagnosis not present

## 2017-08-15 DIAGNOSIS — H9202 Otalgia, left ear: Secondary | ICD-10-CM | POA: Diagnosis not present

## 2017-08-15 DIAGNOSIS — M79604 Pain in right leg: Secondary | ICD-10-CM | POA: Diagnosis not present

## 2017-08-15 DIAGNOSIS — I129 Hypertensive chronic kidney disease with stage 1 through stage 4 chronic kidney disease, or unspecified chronic kidney disease: Secondary | ICD-10-CM | POA: Diagnosis not present

## 2017-08-15 DIAGNOSIS — Z6824 Body mass index (BMI) 24.0-24.9, adult: Secondary | ICD-10-CM | POA: Diagnosis not present

## 2017-08-15 DIAGNOSIS — Z23 Encounter for immunization: Secondary | ICD-10-CM | POA: Diagnosis not present

## 2017-08-15 DIAGNOSIS — R7301 Impaired fasting glucose: Secondary | ICD-10-CM | POA: Diagnosis not present

## 2017-08-15 DIAGNOSIS — E782 Mixed hyperlipidemia: Secondary | ICD-10-CM | POA: Diagnosis not present

## 2017-08-15 DIAGNOSIS — I482 Chronic atrial fibrillation: Secondary | ICD-10-CM | POA: Diagnosis not present

## 2017-08-15 DIAGNOSIS — N183 Chronic kidney disease, stage 3 (moderate): Secondary | ICD-10-CM | POA: Diagnosis not present

## 2017-10-03 DIAGNOSIS — H6121 Impacted cerumen, right ear: Secondary | ICD-10-CM | POA: Diagnosis not present

## 2017-10-03 DIAGNOSIS — H9212 Otorrhea, left ear: Secondary | ICD-10-CM | POA: Diagnosis not present

## 2017-10-03 DIAGNOSIS — H60392 Other infective otitis externa, left ear: Secondary | ICD-10-CM | POA: Diagnosis not present

## 2017-10-10 NOTE — Progress Notes (Signed)
Cardiology Office Note   Date:  10/16/2017   ID:  Ricardo Garrett, DOB 1945-12-03, MRN [id number]  PCP:  [name], MD  Cardiologist: Oswaldo Conroy, MD   No chief complaint on file.     History of Present Illness: Ricardo Garrett is a 71 y.o. male who presents for f/u systolic CHF, history of anasarca, rapid atrial fibrillation, chronic lower extremity edema, mild renal insufficiency. Patient had DCCV by me on 04/05/2016 and was converted to normal sinus rhythm.   Because of his fatigue and changed his metoprolol to 25 mg at night and 12.5 mg in the a.m. This was in an effort to avoid daytime fatigue. He was continued on anticoagulation therapy.   Has had issues with hearing loss ENT not helpful Hearing aids too expensive  Pain in right leg from old femur fracture Only thing that helps is naproxen No bleeding issues on eliquis   Today is back in afib. More exertional dyspnea some atypical resting pain in chest   Long discussion with patient and family He has refused testing in past. Needs myovue to clarify CAD Given low EF and need for AAT. Willl order myovue if normal proceed with [facility]. Since it's been a year And a half since last one will try without AAT first If fails will choose based on CAD/myuovue results  Recent labs with Dr Nevada Crane   Past Medical History:  Diagnosis Date  . Arthritis   . Broken arm   . CHF (congestive heart failure) ([place])   . COPD (chronic obstructive pulmonary disease) ([place])   . High cholesterol   . HOH (hard of hearing)   . Hypertension   . Oxygen deficiency     Past Surgical History:  Procedure Laterality Date  . CARDIOVERSION N/A 04/05/2016   Procedure: CARDIOVERSION;  Surgeon: Josue Hector, MD;  [facility];  Service: Cardiovascular;  Laterality: N/A;  . COMPRESSION HIP SCREW Right 03/15/2013   Procedure: COMPRESSION HIP;  Surgeon: Sanjuana Kava, MD;  Location: [facility] ORS;  Service: Orthopedics;  Laterality: Right;  . SKIN GRAFT  Left    from right thigh  . TONSILLECTOMY       Current Outpatient Medications  Medication Sig Dispense Refill  . atorvastatin (LIPITOR) 40 MG tablet Take 1 tablet by mouth daily.    . digoxin (LANOXIN) 0.125 MG tablet TAKE ONE TABLET BY MOUTH ONCE DAILY 90 tablet 0  . ELIQUIS 5 MG TABS tablet TAKE ONE TABLET BY MOUTH TWICE DAILY 60 tablet 6  . ezetimibe (ZETIA) 10 MG tablet Take 1 tablet by mouth daily.    . furosemide (LASIX) 40 MG tablet Take 40 mg by mouth daily.    Marland Kitchen KLOR-CON M10 10 MEQ tablet TAKE ONE TABLET BY MOUTH ONCE DAILY 90 tablet 2  . losartan (COZAAR) 50 MG tablet Take 50 mg by mouth daily.     . metoprolol succinate (TOPROL-XL) 25 MG 24 hr tablet TAKE ONE-HALF TABLET BY MOUTH IN THE MORNING AND ONE TABLET IN THE EVENING 135 tablet 3   No current facility-administered medications for this visit.     Allergies:   Patient has no known allergies.    Social History:  The patient  reports that he quit smoking about 37 years ago. His smoking use included cigarettes. He has a 20.00 pack-year smoking history. he has never used smokeless tobacco. He reports that he drinks alcohol. He reports that he uses drugs. Drug: Marijuana.   Family History:  The patient's family history includes Diabetes in his mother; Heart attack in his father.    ROS: All other systems are reviewed and negative. Unless otherwise mentioned in H&P    PHYSICAL EXAM: VS:  BP 124/82   Pulse (!) 53   Ht 5\' 8"  (1.727 m)   Wt 158 lb (71.7 kg)   SpO2 98% Comment: on room air  BMI 24.02 kg/m  , BMI Body mass index is 24.02 kg/m. Affect appropriate [name]  HEENT: normal Neck supple with no adenopathy JVP normal no bruits no thyromegaly Lungs clear with no wheezing and good diaphragmatic motion Heart:  S1/S2 no murmur, no rub, gallop or click PMI normal Abdomen: benighn, BS positve, no tenderness, no AAA no bruit.  No HSM or HJR Distal pulses intact with no bruits No edema Neuro  non-focal Skin warm and dry No muscular weakness     Recent Labs: No results found for requested labs within last 8760 hours.    Lipid Panel    Component Value Date/Time   CHOL 193 02/12/2016   TRIG 196 02/12/2016   HDL 29 02/12/2016   CHOLHDL 6.7 02/12/2016   VLDL 39 02/12/2016   LDLCALC 125 02/12/2016      Wt Readings from Last 3 Encounters:  10/16/17 158 lb (71.7 kg)  02/02/17 165 lb (74.8 kg)  07/07/16 160 lb (72.6 kg)     ECG:  04/06/16 AFib LvH nonspecific ST/T wave changes  10/16/17 Afib/flutter rate 41 LVH lateral T wave inversions    ASSESSMENT AND PLAN:  1. Atrial fibrillation: rate fine anticoagulation NOAC. Will review labs from Dr Nevada Crane Digoxin level  Plan [facility] again without AAT if myovue normal   2. Systolic CHF:. Most recent echo March 2017 revealed EF of 35-40%. Myovue ordered to assess For ischemic DCM will need to hold eliquis and postpone [facility] if positive Myovue will be done With lexiscan as he has difficulty ambulating due to rod in right leg   3. 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  4. Arthritis: only thing that works well is naproxen. Has old broken femur on right leg Despite mildly increased risk on elqiuis Will continue for quality of life   Labs/ tests ordered today include: Requesting labs from primary care physician.  myovue   Orders Placed This Encounter  Procedures  . NM Myocar Multi W/Spect W/Wall Motion / EF  . EKG 12-Lead     Disposition:   FU with 3 months will schedule DCC at [facility] if myovue non ischemic Will schedule Right and left cath at [facility] if positive and hold eliquis for 2 days for invasive procedure   [name]

## 2017-10-13 DIAGNOSIS — H9313 Tinnitus, bilateral: Secondary | ICD-10-CM | POA: Diagnosis not present

## 2017-10-13 DIAGNOSIS — H60332 Swimmer's ear, left ear: Secondary | ICD-10-CM | POA: Diagnosis not present

## 2017-10-16 ENCOUNTER — Encounter: Payer: Self-pay | Admitting: Cardiovascular Disease

## 2017-10-16 ENCOUNTER — Ambulatory Visit: Payer: Medicare HMO | Admitting: Cardiovascular Disease

## 2017-10-16 VITALS — BP 124/82 | HR 53 | Ht 68.0 in | Wt 158.0 lb

## 2017-10-16 DIAGNOSIS — I480 Paroxysmal atrial fibrillation: Secondary | ICD-10-CM | POA: Diagnosis not present

## 2017-10-16 NOTE — Patient Instructions (Signed)
Medication Instructions:  Your physician recommends that you continue on your current medications as directed. Please refer to the Current Medication list given to you today.   Labwork: none  Testing/Procedures: Your physician has requested that you have a lexiscan myoview. For further information please visit [email or web address]. Please follow instruction sheet, as given.    Follow-Up: Your physician recommends that you schedule a follow-up appointment in: to be determined based on test    Any Other Special Instructions Will Be Listed Below (If Applicable).     If you need a refill on your cardiac medications before your next appointment, please call your pharmacy.

## 2017-10-23 DIAGNOSIS — H9313 Tinnitus, bilateral: Secondary | ICD-10-CM | POA: Diagnosis not present

## 2017-10-23 DIAGNOSIS — H60332 Swimmer's ear, left ear: Secondary | ICD-10-CM | POA: Diagnosis not present

## 2017-10-27 ENCOUNTER — Encounter (HOSPITAL_COMMUNITY): Payer: Self-pay

## 2017-10-27 ENCOUNTER — Encounter (HOSPITAL_BASED_OUTPATIENT_CLINIC_OR_DEPARTMENT_OTHER)
Admission: RE | Admit: 2017-10-27 | Discharge: 2017-10-27 | Disposition: A | Discharge status: Home / Self Care | Payer: Medicare HMO | Source: Ambulatory Visit | Attending: Cardiovascular Disease | Admitting: Cardiovascular Disease

## 2017-10-27 ENCOUNTER — Encounter (HOSPITAL_COMMUNITY)
Admission: RE | Admit: 2017-10-27 | Discharge: 2017-10-27 | Disposition: A | Discharge status: Home / Self Care | Payer: Medicare HMO | Source: Ambulatory Visit | Attending: Cardiovascular Disease | Admitting: Cardiovascular Disease

## 2017-10-27 DIAGNOSIS — I480 Paroxysmal atrial fibrillation: Secondary | ICD-10-CM | POA: Diagnosis not present

## 2017-10-27 LAB — NM MYOCAR MULTI W/SPECT W/WALL MOTION / EF
CHL CUP NUCLEAR SDS: 3
CHL CUP RESTING HR STRESS: 50 {beats}/min
CSEPPHR: 80 {beats}/min
LVDIAVOL: 93 mL (ref 62–150)
LVSYSVOL: 51 mL
RATE: 0.29
SRS: 5
SSS: 8
TID: 1.15

## 2017-10-27 MED ORDER — SODIUM CHLORIDE 0.9% FLUSH
INTRAVENOUS | Status: AC
  Administered 2017-10-27: 10 mL via INTRAVENOUS
  Filled 2017-10-27: qty 10

## 2017-10-27 MED ORDER — REGADENOSON 0.4 MG/5ML IV SOLN
INTRAVENOUS | Status: AC
  Administered 2017-10-27: 0.4 mg via INTRAVENOUS
  Filled 2017-10-27: qty 5

## 2017-10-27 MED ORDER — TECHNETIUM TC 99M TETROFOSMIN IV KIT
10.0000 | PACK | Freq: Once | INTRAVENOUS | Status: AC | PRN
  Administered 2017-10-27: 11 via INTRAVENOUS

## 2017-10-27 MED ORDER — TECHNETIUM TC 99M TETROFOSMIN IV KIT
30.0000 | PACK | Freq: Once | INTRAVENOUS | Status: AC | PRN
  Administered 2017-10-27: 29 via INTRAVENOUS

## 2017-11-06 DIAGNOSIS — H9212 Otorrhea, left ear: Secondary | ICD-10-CM | POA: Diagnosis not present

## 2017-11-06 DIAGNOSIS — H60332 Swimmer's ear, left ear: Secondary | ICD-10-CM | POA: Diagnosis not present

## 2017-11-06 DIAGNOSIS — H9313 Tinnitus, bilateral: Secondary | ICD-10-CM | POA: Diagnosis not present

## 2017-11-13 ENCOUNTER — Other Ambulatory Visit: Payer: Self-pay | Admitting: Cardiovascular Disease

## 2017-11-13 NOTE — Progress Notes (Signed)
Cardiology Office Note    Date:  11/14/2017   ID:  [name], DOB 1946-03-09, MRN [id number]  PCP:  [name], MD  Cardiologist: Dr. Johnsie Cancel  Chief Complaint  Patient presents with  . Follow-up    Abnormal NST    History of Present Illness:    Ricardo Garrett is a 72 y.o. male with past medical history of persistent atrial fibrillation (s/p DCCV in 03/2016), chronic combined systolic and diastolic CHF (EF 63-78% by prior echo in 12/2015), HTN, HLD, and hard of hearing who presents to the office today for follow-up of his recent stress test.   He was last examined by Dr. Johnsie Cancel in 09/2017 and a NST was recommended for further evaluation of his cardiomyopathy at that time (the full progress note is not available for review in Epic). This was obtained on 10/27/2017 and showed a moderate-sized, moderate intensity, inferior/inferoseptal defect that is partially reversible and suggestive of scar with mild peri-infarct ischemia, overall being an intermediate-risk study. With his known reduced EF and stress test results, a right and left cardiac catheterization was recommended for definitive evaluation.   In talking with the patient today, he reports having intermittent episodes of chest pain for the past several months which can occur at rest or with activity. Symptoms can last from 5 to 30 minutes and spontaneously resolve. He denies any recent dyspnea on exertion, orthopnea, PND, or lower extremity edema. Does have worsening fatigue by his report.   He reports good compliance with his medication regimen and denies missing any recent doses. No evidence of active bleeding while on Eliquis.   Past Medical History:  Diagnosis Date  . Arthritis   . Broken arm   . CHF (congestive heart failure) ([place])    a. 12/2015: echo showing reduced EF of 35-40% b. NST: 09/2017: scar with peri-infarct ischemia, intermediate-risk study  . COPD (chronic obstructive pulmonary disease) ([place])   . High  cholesterol   . HOH (hard of hearing)   . Hypertension   . Oxygen deficiency   . Persistent atrial fibrillation (Ricardo Garrett)    a. s/p DCCV in 03/2016    Past Surgical History:  Procedure Laterality Date  . CARDIOVERSION N/A 04/05/2016   Procedure: CARDIOVERSION;  Surgeon: Josue Hector, MD;  Location: [facility];  Service: Cardiovascular;  Laterality: N/A;  . COMPRESSION HIP SCREW Right 03/15/2013   Procedure: COMPRESSION HIP;  Surgeon: Sanjuana Kava, MD;  Location: [facility];  Service: Orthopedics;  Laterality: Right;  . SKIN GRAFT Left    from right thigh  . TONSILLECTOMY      Current Medications: Outpatient Medications Prior to Visit  Medication Sig Dispense Refill  . atorvastatin (LIPITOR) 40 MG tablet Take 1 tablet by mouth daily.    . digoxin (LANOXIN) 0.125 MG tablet TAKE 1 TABLET BY MOUTH ONCE DAILY 90 tablet 3  . ELIQUIS 5 MG TABS tablet TAKE ONE TABLET BY MOUTH TWICE DAILY 60 tablet 6  . ezetimibe (ZETIA) 10 MG tablet Take 1 tablet by mouth daily.    . furosemide (LASIX) 40 MG tablet Take 40 mg by mouth daily.    Marland Kitchen KLOR-CON M10 10 MEQ tablet TAKE ONE TABLET BY MOUTH ONCE DAILY 90 tablet 2  . losartan (COZAAR) 50 MG tablet Take 50 mg by mouth daily.     . metoprolol succinate (TOPROL-XL) 25 MG 24 hr tablet TAKE ONE-HALF TABLET BY MOUTH IN THE MORNING AND ONE TABLET IN THE EVENING 135 tablet  3   No facility-administered medications prior to visit.      Allergies:   Patient has no known allergies.   Social History   Socioeconomic History  . Marital status: Married    Spouse name: None  . Number of children: None  . Years of education: None  . Highest education level: None  Social Needs  . Financial resource strain: None  . Food insecurity - worry: None  . Food insecurity - inability: None  . Transportation needs - medical: None  . Transportation needs - non-medical: None  Occupational History  . None  Tobacco Use  . Smoking status: Former Smoker    Packs/day: 1.00     Years: 20.00    Pack years: 20.00    Types: Cigarettes    Last attempt to quit: 11/01/1979    Years since quitting: 38.0  . Smokeless tobacco: Never Used  Substance and Sexual Activity  . Alcohol use: Yes    Alcohol/week: 0.0 oz    Comment: occasional   . Drug use: Yes    Types: Marijuana    Comment: 10 years since has done any.  . Sexual activity: Yes    Birth control/protection: None  Other Topics Concern  . None  Social History Narrative  . None     Family History:  The patient's family history includes Diabetes in his mother; Heart attack in his father.   Review of Systems:   Please see the history of present illness.     General:  No chills, fever, night sweats or weight changes. Positive for fatigue.  Cardiovascular:  No dyspnea on exertion, edema, orthopnea, palpitations, paroxysmal nocturnal dyspnea. Positive for chest pain.  Dermatological: No rash, lesions/masses Respiratory: No cough, dyspnea Urologic: No hematuria, dysuria Abdominal:   No nausea, vomiting, diarrhea, bright red blood per rectum, melena, or hematemesis Neurologic:  No visual changes, wkns, changes in mental status. All other systems reviewed and are otherwise negative except as noted above.   Physical Exam:    VS:  BP 130/86   Pulse 86   Ht 5\' 8"  (1.727 m)   Wt 155 lb 6.4 oz (70.5 kg)   SpO2 96%   BMI 23.63 kg/m    General: Well developed, well nourished Caucasian male appearing in no acute distress. Head: Normocephalic, atraumatic, sclera non-icteric, no xanthomas, nares are without discharge.  Neck: No carotid bruits. JVD not elevated.  Lungs: Respirations regular and unlabored, without wheezes or rales.  Heart: Regular rate and rhythm. No S3 or S4.  No murmur, no rubs, or gallops appreciated. Abdomen: Soft, non-tender, non-distended with normoactive bowel sounds. No hepatomegaly. No rebound/guarding. No obvious abdominal masses. Msk:  Strength and tone appear normal for age. No joint  deformities or effusions. Extremities: No clubbing or cyanosis. No lower extremity edema.  Distal pedal pulses are 2+ bilaterally. Neuro: Alert and oriented X 3. Moves all extremities spontaneously. No focal deficits noted. Psych:  Responds to questions appropriately with a normal affect. Skin: No rashes or lesions noted  Wt Readings from Last 3 Encounters:  11/14/17 155 lb 6.4 oz (70.5 kg)  10/16/17 158 lb (71.7 kg)  02/02/17 165 lb (74.8 kg)     Studies/Labs Reviewed:   EKG:  EKG is not ordered today.   Recent Labs: 11/14/2017: BUN 40; Creatinine, Ser 1.99; Hemoglobin 16.5; Platelets 263; Potassium 4.8; Sodium 137   Lipid Panel    Component Value Date/Time   CHOL 193 02/12/2016   TRIG 196 02/12/2016  HDL 29 02/12/2016   CHOLHDL 6.7 02/12/2016   VLDL 39 02/12/2016   LDLCALC 125 02/12/2016    Additional studies/ records that were reviewed today include:   Echocardiogram: 12/2015 Study Conclusions  - Left ventricle: The cavity size was mildly dilated. Wall   thickness was increased in a pattern of moderate LVH. Systolic   function was moderately reduced. The estimated ejection fraction   was in the range of 35% to 40%. Diffuse hypokinesis. - Mitral valve: There was mild regurgitation. - Left atrium: The atrium was mildly dilated. - Right ventricle: The cavity size was mildly dilated. - Right atrium: The atrium was mildly dilated. - Atrial septum: No defect or patent foramen ovale was identified. - Pulmonary arteries: PA peak pressure: 47 mm Hg (S).   NST: 10/27/2017  No diagnostic ST segment changes to indicate ischemia.  Moderate-sized, moderate intensity, inferior/inferoseptal defect that is partially reversible and suggestive of scar with mild peri-infarct ischemia.  This is an intermediate risk study.  Nuclear stress EF: 45%. Consider echocardiogram for more complete assessment.  Assessment:    1. Chronic combined systolic and diastolic heart failure  ([place])   2. Abnormal stress test   3. Pre-op testing   4. Persistent atrial fibrillation ([place])   5. Essential hypertension   6. Hyperlipidemia LDL goal <70   7. CKD (chronic kidney disease) stage 3, GFR 30-59 ml/min ([facility])   8. Bilateral hearing loss, unspecified hearing loss type      Plan:   In order of problems listed above:  1. Chronic Combined Systolic and Diastolic CHF/ Abnormal NST - has a known reduced EF of 35-40% by prior echo in 12/2015 with EF estimated by recent NST. Has continued to have episodes of chest pain and worsening fatigue over the past several months. NST on 10/27/2017 showed a moderate-sized, moderate intensity, inferior/inferoseptal defect that is partially reversible and suggestive of scar with mild peri-infarct ischemia, overall being an intermediate-risk study. A right and left cardiac catheterization was recommended for further evaluation of his cardiomyopathy.  - The procedure was reviewed in detail with the patient and his family during today's visit. The patient understands that risks included but are not limited to stroke (1 in 1000), death (1 in 61), kidney failure [usually temporary] (1 in 500), bleeding (1 in 200), allergic reaction [possibly serious] (1 in 200). Will check pre-procedure labs and CXR today.  - continue BB and ARB in the setting of his cardiomyopathy. Will hold Lasix the day of his procedure and possibly longer pending BMET results. Hold Eliquis 48 hours prior to the catheterization.   2. Persistent Atrial Fibrillation - s/p DCCV in 03/2016. He denies any recurrent palpitations and is maintaining NSR by examination today. - continue Lopressor and Digoxin. Needs Dig Level drawn with next labs.  - he denies any evidence of active bleeding. Continue Eliquis for anticoagulation. Will hold this for 48 hours prior to his cardiac catheterization.   3. HTN - BP is well-controlled at 130/86 during today's visit. - continue Losartan 50mg  daily and  Toprol-XL 12.5mg  in AM/25mg  in PM. Consider further titration of Losartan pending BMET results.   4. HLD - Lipids followed by PCP. Goal LDL is < 70. - continue Atorvastatin 40mg  daily.   5. Stage 3 CKD - baseline creatinine 1.2 - 1.5 in 2017. Will recheck BMET today.   6. Hard of Hearing - scheduled to see ENT later this week.  Medication Adjustments/Labs and Tests Ordered: Current medicines are reviewed at  length with the patient today.  Concerns regarding medicines are outlined above.  Medication changes, Labs and Tests ordered today are listed in the Patient Instructions below. Patient Instructions  Medication Instructions:  Your physician recommends that you continue on your current medications as directed. Please refer to the Current Medication list given to you today.  Labwork: Your physician recommends that you return for lab work in: Today   Testing/Procedures: Your physician has requested that you have a cardiac catheterization. Cardiac catheterization is used to diagnose and/or treat various heart conditions. Doctors may recommend this procedure for a number of different reasons. The most common reason is to evaluate chest pain. Chest pain can be a symptom of coronary artery disease (CAD), and cardiac catheterization can show whether plaque is narrowing or blocking your heart's arteries. This procedure is also used to evaluate the valves, as well as measure the blood flow and oxygen levels in different parts of your heart. For further information please visit [email or web address]. Please follow instruction sheet, as given.  Follow-Up: Your physician recommends that you schedule a follow-up appointment in: 2 Months with Dr. Johnsie Cancel   Any Other Special Instructions Will Be Listed Below (If Applicable).  If you need a refill on your cardiac medications before your next appointment, please call your pharmacy. Thank you for choosing [facility]!     [address] [place] [address] Dept: [phone] Loc: [phone]  SHAHIEM BEDWELL  11/14/2017  You are scheduled for a Cardiac Catheterization on Thursday,January  24 with Dr. Lauree Chandler.  1. Please arrive at the [facility] (Main Entrance A) at [facility]: [address] [place], [place] [address] at 5:30 AM (two hours before your procedure to ensure your preparation). Free valet parking service is available.   Special note: Every effort is made to have your procedure done on time. Please understand that emergencies sometimes delay scheduled procedures.  2. Diet: Do not eat or drink anything after midnight prior to your procedure except sips of water to take medications.  3. Labs: Have done Today   4. Medication instructions in preparation for your procedure:  Stop taking Eliquis (Apixiban) on Monday, January 21.  Do Not Take Lasix the Morning of your Cath.    On the morning of your procedure, take your Aspirin and any morning medicines NOT listed above.  You may use sips of water.  5. Plan for one night stay--bring personal belongings. 6. Bring a current list of your medications and current insurance cards. 7. You MUST have a responsible person to drive you home. 8. Someone MUST be with you the first 24 hours after you arrive home or your discharge will be delayed. 9. Please wear clothes that are easy to get on and off and wear slip-on shoes.  Thank you for allowing [place] to care for you!   -- [facility]   Signed, Erma Heritage, Hershal Coria  11/14/2017 7:11 PM    [facility] [address] [place], [place] [address] Phone: [phone]

## 2017-11-13 NOTE — H&P (View-Only) (Signed)
Cardiology Office Note    Date:  11/14/2017   ID:  Ricardo Garrett, DOB 09-Apr-1946, MRN [id number]  PCP:  [name], MD  Cardiologist: Dr. Johnsie Cancel  Chief Complaint  Patient presents with  . Follow-up    Abnormal NST    History of Present Illness:    Ricardo Garrett is a 72 y.o. male with past medical history of persistent atrial fibrillation (s/p DCCV in 03/2016), chronic combined systolic and diastolic CHF (EF 79-02% by prior echo in 12/2015), HTN, HLD, and hard of hearing who presents to the office today for follow-up of his recent stress test.   He was last examined by Dr. Johnsie Cancel in 09/2017 and a NST was recommended for further evaluation of his cardiomyopathy at that time (the full progress note is not available for review in Epic). This was obtained on 10/27/2017 and showed a moderate-sized, moderate intensity, inferior/inferoseptal defect that is partially reversible and suggestive of scar with mild peri-infarct ischemia, overall being an intermediate-risk study. With his known reduced EF and stress test results, a right and left cardiac catheterization was recommended for definitive evaluation.   In talking with the patient today, he reports having intermittent episodes of chest pain for the past several months which can occur at rest or with activity. Symptoms can last from 5 to 30 minutes and spontaneously resolve. He denies any recent dyspnea on exertion, orthopnea, PND, or lower extremity edema. Does have worsening fatigue by his report.   He reports good compliance with his medication regimen and denies missing any recent doses. No evidence of active bleeding while on Eliquis.   Past Medical History:  Diagnosis Date  . Arthritis   . Broken arm   . CHF (congestive heart failure) ([place])    a. 12/2015: echo showing reduced EF of 35-40% b. NST: 09/2017: scar with peri-infarct ischemia, intermediate-risk study  . COPD (chronic obstructive pulmonary disease) ([place])   . High  cholesterol   . HOH (hard of hearing)   . Hypertension   . Oxygen deficiency   . Persistent atrial fibrillation ([place])    a. s/p DCCV in 03/2016    Past Surgical History:  Procedure Laterality Date  . CARDIOVERSION N/A 04/05/2016   Procedure: CARDIOVERSION;  Surgeon: Josue Hector, MD;  Location: [facility];  Service: Cardiovascular;  Laterality: N/A;  . COMPRESSION HIP SCREW Right 03/15/2013   Procedure: COMPRESSION HIP;  Surgeon: Sanjuana Kava, MD;  Location: [facility] ORS;  Service: Orthopedics;  Laterality: Right;  . SKIN GRAFT Left    from right thigh  . TONSILLECTOMY      Current Medications: Outpatient Medications Prior to Visit  Medication Sig Dispense Refill  . atorvastatin (LIPITOR) 40 MG tablet Take 1 tablet by mouth daily.    . digoxin (LANOXIN) 0.125 MG tablet TAKE 1 TABLET BY MOUTH ONCE DAILY 90 tablet 3  . ELIQUIS 5 MG TABS tablet TAKE ONE TABLET BY MOUTH TWICE DAILY 60 tablet 6  . ezetimibe (ZETIA) 10 MG tablet Take 1 tablet by mouth daily.    . furosemide (LASIX) 40 MG tablet Take 40 mg by mouth daily.    Marland Kitchen KLOR-CON M10 10 MEQ tablet TAKE ONE TABLET BY MOUTH ONCE DAILY 90 tablet 2  . losartan (COZAAR) 50 MG tablet Take 50 mg by mouth daily.     . metoprolol succinate (TOPROL-XL) 25 MG 24 hr tablet TAKE ONE-HALF TABLET BY MOUTH IN THE MORNING AND ONE TABLET IN THE EVENING 135 tablet  3   No facility-administered medications prior to visit.      Allergies:   Patient has no known allergies.   Social History   Socioeconomic History  . Marital status: Married    Spouse name: None  . Number of children: None  . Years of education: None  . Highest education level: None  Social Needs  . Financial resource strain: None  . Food insecurity - worry: None  . Food insecurity - inability: None  . Transportation needs - medical: None  . Transportation needs - non-medical: None  Occupational History  . None  Tobacco Use  . Smoking status: Former Smoker    Packs/day: 1.00     Years: 20.00    Pack years: 20.00    Types: Cigarettes    Last attempt to quit: 11/01/1979    Years since quitting: 38.0  . Smokeless tobacco: Never Used  Substance and Sexual Activity  . Alcohol use: Yes    Alcohol/week: 0.0 oz    Comment: occasional   . Drug use: Yes    Types: Marijuana    Comment: 10 years since has done any.  . Sexual activity: Yes    Birth control/protection: None  Other Topics Concern  . None  Social History Narrative  . None     Family History:  The patient's family history includes Diabetes in his mother; Heart attack in his father.   Review of Systems:   Please see the history of present illness.     General:  No chills, fever, night sweats or weight changes. Positive for fatigue.  Cardiovascular:  No dyspnea on exertion, edema, orthopnea, palpitations, paroxysmal nocturnal dyspnea. Positive for chest pain.  Dermatological: No rash, lesions/masses Respiratory: No cough, dyspnea Urologic: No hematuria, dysuria Abdominal:   No nausea, vomiting, diarrhea, bright red blood per rectum, melena, or hematemesis Neurologic:  No visual changes, wkns, changes in mental status. All other systems reviewed and are otherwise negative except as noted above.   Physical Exam:    VS:  BP 130/86   Pulse 86   Ht 5\' 8"  (1.727 m)   Wt 155 lb 6.4 oz (70.5 kg)   SpO2 96%   BMI 23.63 kg/m    General: Well developed, well nourished Caucasian male appearing in no acute distress. Head: Normocephalic, atraumatic, sclera non-icteric, no xanthomas, nares are without discharge.  Neck: No carotid bruits. JVD not elevated.  Lungs: Respirations regular and unlabored, without wheezes or rales.  Heart: Regular rate and rhythm. No S3 or S4.  No murmur, no rubs, or gallops appreciated. Abdomen: Soft, non-tender, non-distended with normoactive bowel sounds. No hepatomegaly. No rebound/guarding. No obvious abdominal masses. Msk:  Strength and tone appear normal for age. No joint  deformities or effusions. Extremities: No clubbing or cyanosis. No lower extremity edema.  Distal pedal pulses are 2+ bilaterally. Neuro: Alert and oriented X 3. Moves all extremities spontaneously. No focal deficits noted. Psych:  Responds to questions appropriately with a normal affect. Skin: No rashes or lesions noted  Wt Readings from Last 3 Encounters:  11/14/17 155 lb 6.4 oz (70.5 kg)  10/16/17 158 lb (71.7 kg)  02/02/17 165 lb (74.8 kg)     Studies/Labs Reviewed:   EKG:  EKG is not ordered today.   Recent Labs: 11/14/2017: BUN 40; Creatinine, Ser 1.99; Hemoglobin 16.5; Platelets 263; Potassium 4.8; Sodium 137   Lipid Panel    Component Value Date/Time   CHOL 193 02/12/2016   TRIG 196 02/12/2016  HDL 29 02/12/2016   CHOLHDL 6.7 02/12/2016   VLDL 39 02/12/2016   LDLCALC 125 02/12/2016    Additional studies/ records that were reviewed today include:   Echocardiogram: 12/2015 Study Conclusions  - Left ventricle: The cavity size was mildly dilated. Wall   thickness was increased in a pattern of moderate LVH. Systolic   function was moderately reduced. The estimated ejection fraction   was in the range of 35% to 40%. Diffuse hypokinesis. - Mitral valve: There was mild regurgitation. - Left atrium: The atrium was mildly dilated. - Right ventricle: The cavity size was mildly dilated. - Right atrium: The atrium was mildly dilated. - Atrial septum: No defect or patent foramen ovale was identified. - Pulmonary arteries: PA peak pressure: 47 mm Hg (S).   NST: 10/27/2017  No diagnostic ST segment changes to indicate ischemia.  Moderate-sized, moderate intensity, inferior/inferoseptal defect that is partially reversible and suggestive of scar with mild peri-infarct ischemia.  This is an intermediate risk study.  Nuclear stress EF: 45%. Consider echocardiogram for more complete assessment.  Assessment:    1. Chronic combined systolic and diastolic heart failure  ([place])   2. Abnormal stress test   3. Pre-op testing   4. Persistent atrial fibrillation ([place])   5. Essential hypertension   6. Hyperlipidemia LDL goal <70   7. CKD (chronic kidney disease) stage 3, GFR 30-59 ml/min (HCC)   8. Bilateral hearing loss, unspecified hearing loss type      Plan:   In order of problems listed above:  1. Chronic Combined Systolic and Diastolic CHF/ Abnormal NST - has a known reduced EF of 35-40% by prior echo in 12/2015 with EF estimated by recent NST. Has continued to have episodes of chest pain and worsening fatigue over the past several months. NST on 10/27/2017 showed a moderate-sized, moderate intensity, inferior/inferoseptal defect that is partially reversible and suggestive of scar with mild peri-infarct ischemia, overall being an intermediate-risk study. A right and left cardiac catheterization was recommended for further evaluation of his cardiomyopathy.  - The procedure was reviewed in detail with the patient and his family during today's visit. The patient understands that risks included but are not limited to stroke (1 in 1000), death (1 in 49), kidney failure [usually temporary] (1 in 500), bleeding (1 in 200), allergic reaction [possibly serious] (1 in 200). Will check pre-procedure labs and CXR today.  - continue BB and ARB in the setting of his cardiomyopathy. Will hold Lasix the day of his procedure and possibly longer pending BMET results. Hold Eliquis 48 hours prior to the catheterization.   2. Persistent Atrial Fibrillation - s/p DCCV in 03/2016. He denies any recurrent palpitations and is maintaining NSR by examination today. - continue Lopressor and Digoxin. Needs Dig Level drawn with next labs.  - he denies any evidence of active bleeding. Continue Eliquis for anticoagulation. Will hold this for 48 hours prior to his cardiac catheterization.   3. HTN - BP is well-controlled at 130/86 during today's visit. - continue Losartan 50mg  daily and  Toprol-XL 12.5mg  in AM/25mg  in PM. Consider further titration of Losartan pending BMET results.   4. HLD - Lipids followed by PCP. Goal LDL is < 70. - continue Atorvastatin 40mg  daily.   5. Stage 3 CKD - baseline creatinine 1.2 - 1.5 in 2017. Will recheck BMET today.   6. Hard of Hearing - scheduled to see ENT later this week.  Medication Adjustments/Labs and Tests Ordered: Current medicines are reviewed at  length with the patient today.  Concerns regarding medicines are outlined above.  Medication changes, Labs and Tests ordered today are listed in the Patient Instructions below. Patient Instructions  Medication Instructions:  Your physician recommends that you continue on your current medications as directed. Please refer to the Current Medication list given to you today.  Labwork: Your physician recommends that you return for lab work in: Today   Testing/Procedures: Your physician has requested that you have a cardiac catheterization. Cardiac catheterization is used to diagnose and/or treat various heart conditions. Doctors may recommend this procedure for a number of different reasons. The most common reason is to evaluate chest pain. Chest pain can be a symptom of coronary artery disease (CAD), and cardiac catheterization can show whether plaque is narrowing or blocking your heart's arteries. This procedure is also used to evaluate the valves, as well as measure the blood flow and oxygen levels in different parts of your heart. For further information please visit [email or web address].[email or web address]. Please follow instruction sheet, as given.  Follow-Up: Your physician recommends that you schedule a follow-up appointment in: 2 Months with Dr. Johnsie Cancel   Any Other Special Instructions Will Be Listed Below (If Applicable).  If you need a refill on your cardiac medications before your next appointment, please call your pharmacy. Thank you for choosing [facility]!     [place] [address] Dept: [phone] Loc: [phone]  Ricardo Garrett  11/14/2017  You are scheduled for a Cardiac Catheterization on Thursday,January  24 with Dr. Lauree Chandler.  1. Please arrive at the [facility] (Main Entrance A) at [facility]: [address] [place], [place] [address] at 5:30 AM (two hours before your procedure to ensure your preparation). Free valet parking service is available.   Special note: Every effort is made to have your procedure done on time. Please understand that emergencies sometimes delay scheduled procedures.  2. Diet: Do not eat or drink anything after midnight prior to your procedure except sips of water to take medications.  3. Labs: Have done Today   4. Medication instructions in preparation for your procedure:  Stop taking Eliquis (Apixiban) on Monday, January 21.  Do Not Take Lasix the Morning of your Cath.    On the morning of your procedure, take your Aspirin and any morning medicines NOT listed above.  You may use sips of water.  5. Plan for one night stay--bring personal belongings. 6. Bring a current list of your medications and current insurance cards. 7. You MUST have a responsible person to drive you home. 8. Someone MUST be with you the first 24 hours after you arrive home or your discharge will be delayed. 9. Please wear clothes that are easy to get on and off and wear slip-on shoes.  Thank you for allowing [place] to care for you!   -- [facility]   Signed, Erma Heritage, Hershal Coria  11/14/2017 7:11 PM    [place] [address] [place],  [address] Phone: [phone]

## 2017-11-14 ENCOUNTER — Ambulatory Visit (HOSPITAL_COMMUNITY)
Admission: RE | Admit: 2017-11-14 | Discharge: 2017-11-14 | Disposition: A | Discharge status: Home / Self Care | Payer: Medicare HMO | Source: Ambulatory Visit | Attending: Student | Admitting: Student

## 2017-11-14 ENCOUNTER — Ambulatory Visit: Payer: Medicare HMO | Admitting: Student

## 2017-11-14 ENCOUNTER — Other Ambulatory Visit (HOSPITAL_COMMUNITY)
Admission: RE | Admit: 2017-11-14 | Discharge: 2017-11-14 | Disposition: A | Discharge status: Home / Self Care | Payer: Medicare HMO | Source: Ambulatory Visit | Attending: *Deleted | Admitting: *Deleted

## 2017-11-14 ENCOUNTER — Encounter: Payer: Self-pay | Admitting: Student

## 2017-11-14 VITALS — BP 130/86 | HR 86 | Ht 68.0 in | Wt 155.4 lb

## 2017-11-14 DIAGNOSIS — Z01818 Encounter for other preprocedural examination: Secondary | ICD-10-CM | POA: Insufficient documentation

## 2017-11-14 DIAGNOSIS — R9439 Abnormal result of other cardiovascular function study: Secondary | ICD-10-CM | POA: Diagnosis not present

## 2017-11-14 DIAGNOSIS — I4819 Other persistent atrial fibrillation: Secondary | ICD-10-CM

## 2017-11-14 DIAGNOSIS — I5042 Chronic combined systolic (congestive) and diastolic (congestive) heart failure: Secondary | ICD-10-CM | POA: Insufficient documentation

## 2017-11-14 DIAGNOSIS — N183 Chronic kidney disease, stage 3 unspecified: Secondary | ICD-10-CM

## 2017-11-14 DIAGNOSIS — I10 Essential (primary) hypertension: Secondary | ICD-10-CM

## 2017-11-14 DIAGNOSIS — I481 Persistent atrial fibrillation: Secondary | ICD-10-CM

## 2017-11-14 DIAGNOSIS — E785 Hyperlipidemia, unspecified: Secondary | ICD-10-CM | POA: Diagnosis not present

## 2017-11-14 DIAGNOSIS — I509 Heart failure, unspecified: Secondary | ICD-10-CM | POA: Diagnosis not present

## 2017-11-14 DIAGNOSIS — H9193 Unspecified hearing loss, bilateral: Secondary | ICD-10-CM | POA: Diagnosis not present

## 2017-11-14 LAB — CBC WITH DIFFERENTIAL/PLATELET
BASOS ABS: 0 10*3/uL (ref 0.0–0.1)
Basophils Relative: 0 %
EOS PCT: 3 %
Eosinophils Absolute: 0.3 10*3/uL (ref 0.0–0.7)
HCT: 50.4 % (ref 39.0–52.0)
HEMOGLOBIN: 16.5 g/dL (ref 13.0–17.0)
LYMPHS ABS: 1.9 10*3/uL (ref 0.7–4.0)
LYMPHS PCT: 19 %
MCH: 32 pg (ref 26.0–34.0)
MCHC: 32.7 g/dL (ref 30.0–36.0)
MCV: 97.9 fL (ref 78.0–100.0)
Monocytes Absolute: 1.3 10*3/uL — ABNORMAL HIGH (ref 0.1–1.0)
Monocytes Relative: 13 %
Neutro Abs: 6.5 10*3/uL (ref 1.7–7.7)
Neutrophils Relative %: 65 %
PLATELETS: 263 10*3/uL (ref 150–400)
RBC: 5.15 MIL/uL (ref 4.22–5.81)
RDW: 12.9 % (ref 11.5–15.5)
WBC: 10 10*3/uL (ref 4.0–10.5)

## 2017-11-14 LAB — BASIC METABOLIC PANEL
ANION GAP: 12 (ref 5–15)
BUN: 40 mg/dL — AB (ref 6–20)
CHLORIDE: 98 mmol/L — AB (ref 101–111)
CO2: 27 mmol/L (ref 22–32)
Calcium: 9.5 mg/dL (ref 8.9–10.3)
Creatinine, Ser: 1.99 mg/dL — ABNORMAL HIGH (ref 0.61–1.24)
GFR calc Af Amer: 37 mL/min — ABNORMAL LOW (ref >60)
GFR calc non Af Amer: 32 mL/min — ABNORMAL LOW (ref >60)
GLUCOSE: 106 mg/dL — AB (ref 65–99)
POTASSIUM: 4.8 mmol/L (ref 3.5–5.1)
Sodium: 137 mmol/L (ref 135–145)

## 2017-11-14 NOTE — Patient Instructions (Addendum)
Medication Instructions:  Your physician recommends that you continue on your current medications as directed. Please refer to the Current Medication list given to you today.   Labwork: Your physician recommends that you return for lab work in: Today    Testing/Procedures: Your physician has requested that you have a cardiac catheterization. Cardiac catheterization is used to diagnose and/or treat various heart conditions. Doctors may recommend this procedure for a number of different reasons. The most common reason is to evaluate chest pain. Chest pain can be a symptom of coronary artery disease (CAD), and cardiac catheterization can show whether plaque is narrowing or blocking your heart's arteries. This procedure is also used to evaluate the valves, as well as measure the blood flow and oxygen levels in different parts of your heart. For further information please visit [email or web address].[email or web address]. Please follow instruction sheet, as given.    Follow-Up: Your physician recommends that you schedule a follow-up appointment in: 2 Months with Dr. Johnsie Cancel    Any Other Special Instructions Will Be Listed Below (If Applicable).     If you need a refill on your cardiac medications before your next appointment, please call your pharmacy. Thank you for choosing [facility]!     Ricardo Garrett [address] Dept: [phone] Loc: [phone]  Ricardo Garrett  11/14/2017  You are scheduled for a Cardiac Catheterization on Thursday,January  24 with Dr. Lauree Chandler.  1. Please arrive at the [facility] (Main Entrance A) at [facility]: [address] [place], [place] [address] at 5:30 AM (two hours before your procedure to ensure your preparation). Free valet parking service is available.   Special note: Every effort is made to have your procedure done on time. Please understand  that emergencies sometimes delay scheduled procedures.  2. Diet: Do not eat or drink anything after midnight prior to your procedure except sips of water to take medications.  3. Labs: Have done Today   4. Medication instructions in preparation for your procedure:  Stop taking Eliquis (Apixiban) on Monday, January 21.  Do Not Take Lasix the Morning of your Cath.    On the morning of your procedure, take your Aspirin and any morning medicines NOT listed above.  You may use sips of water.  5. Plan for one night stay--bring personal belongings. 6. Bring a current list of your medications and current insurance cards. 7. You MUST have a responsible person to drive you home. 8. Someone MUST be with you the first 24 hours after you arrive home or your discharge will be delayed. 9. Please wear clothes that are easy to get on and off and wear slip-on shoes.  Thank you for allowing [place] to care for you!   -- [facility]

## 2017-11-15 ENCOUNTER — Encounter: Payer: Self-pay | Admitting: *Deleted

## 2017-11-17 DIAGNOSIS — H60332 Swimmer's ear, left ear: Secondary | ICD-10-CM | POA: Diagnosis not present

## 2017-11-17 DIAGNOSIS — H9313 Tinnitus, bilateral: Secondary | ICD-10-CM | POA: Diagnosis not present

## 2017-11-22 ENCOUNTER — Telehealth: Payer: Self-pay | Admitting: *Deleted

## 2017-11-22 NOTE — Telephone Encounter (Signed)
Patient contacted pre-catheterization at [facility] scheduled for: 11/23/17 R and LHC  Verified arrival time and place: 11/23/17 [facility] NT/Main A 6:30 AM/1:30PM  Confirmed AM meds to be taken pre-cath with sip of water: Aspirin 81 mg AM of Eliquis --hold for 48 hours pre Losartan-hold for 48 hours pre Lasix-hold for 48 hours pre  Confirmed patient has responsible person to drive home post procedure and observe patient for 24 hours:  Addl concerns:   BMET day of ordered.

## 2017-11-22 NOTE — Telephone Encounter (Signed)
I have been unable to reach pt by telephone at phone numbers listed/ unable to leave voice mail.

## 2017-11-22 NOTE — Telephone Encounter (Signed)
11/22/17--Spoke with pt's wife, [name], with patient's verbal permission.  Patient contacted pre-catheterization at [facility] scheduled for: 11/23/17 R and LHC.  Verified arrival time and place:11/23/17 [facility] NT/Main A 6:30AM/1:30PM  Confirmed AM meds to be taken pre-cath with sip of water:  Aspirin 81 mg AM of Eliquis hold for 48 hours pre Lasix hold for 48 hours pre Losartan hold for 48 hours pre  Confirmed patient has responsible person to drive home post procedure and observe patient for 24 hours: yes  Addl concerns:   BMET day of ordered.

## 2017-11-23 ENCOUNTER — Encounter (HOSPITAL_COMMUNITY)
Admission: RE | Disposition: A | Discharge status: Home / Self Care | Payer: Self-pay | Source: Ambulatory Visit | Attending: Cardiovascular Disease

## 2017-11-23 ENCOUNTER — Encounter (HOSPITAL_COMMUNITY): Payer: Self-pay | Admitting: General Practice

## 2017-11-23 ENCOUNTER — Other Ambulatory Visit: Payer: Self-pay

## 2017-11-23 ENCOUNTER — Inpatient Hospital Stay (HOSPITAL_COMMUNITY)
Admission: RE | Admit: 2017-11-23 | Discharge: 2017-11-28 | DRG: 243 | Disposition: A | Discharge status: Home / Self Care | Payer: Medicare HMO | Source: Ambulatory Visit | Attending: Internal Medicine | Admitting: Internal Medicine

## 2017-11-23 DIAGNOSIS — I200 Unstable angina: Secondary | ICD-10-CM | POA: Diagnosis present

## 2017-11-23 DIAGNOSIS — Z8249 Family history of ischemic heart disease and other diseases of the circulatory system: Secondary | ICD-10-CM

## 2017-11-23 DIAGNOSIS — I481 Persistent atrial fibrillation: Secondary | ICD-10-CM | POA: Diagnosis present

## 2017-11-23 DIAGNOSIS — Z955 Presence of coronary angioplasty implant and graft: Secondary | ICD-10-CM

## 2017-11-23 DIAGNOSIS — I255 Ischemic cardiomyopathy: Secondary | ICD-10-CM | POA: Diagnosis not present

## 2017-11-23 DIAGNOSIS — I482 Chronic atrial fibrillation: Secondary | ICD-10-CM | POA: Diagnosis present

## 2017-11-23 DIAGNOSIS — I2582 Chronic total occlusion of coronary artery: Secondary | ICD-10-CM | POA: Diagnosis present

## 2017-11-23 DIAGNOSIS — Z7901 Long term (current) use of anticoagulants: Secondary | ICD-10-CM

## 2017-11-23 DIAGNOSIS — I4891 Unspecified atrial fibrillation: Secondary | ICD-10-CM | POA: Diagnosis not present

## 2017-11-23 DIAGNOSIS — I5042 Chronic combined systolic (congestive) and diastolic (congestive) heart failure: Secondary | ICD-10-CM | POA: Diagnosis not present

## 2017-11-23 DIAGNOSIS — I442 Atrioventricular block, complete: Secondary | ICD-10-CM | POA: Diagnosis present

## 2017-11-23 DIAGNOSIS — I130 Hypertensive heart and chronic kidney disease with heart failure and stage 1 through stage 4 chronic kidney disease, or unspecified chronic kidney disease: Secondary | ICD-10-CM | POA: Diagnosis present

## 2017-11-23 DIAGNOSIS — Z87891 Personal history of nicotine dependence: Secondary | ICD-10-CM

## 2017-11-23 DIAGNOSIS — Z79899 Other long term (current) drug therapy: Secondary | ICD-10-CM

## 2017-11-23 DIAGNOSIS — I25110 Atherosclerotic heart disease of native coronary artery with unstable angina pectoris: Secondary | ICD-10-CM | POA: Diagnosis not present

## 2017-11-23 DIAGNOSIS — H9193 Unspecified hearing loss, bilateral: Secondary | ICD-10-CM | POA: Diagnosis present

## 2017-11-23 DIAGNOSIS — I495 Sick sinus syndrome: Secondary | ICD-10-CM

## 2017-11-23 DIAGNOSIS — E785 Hyperlipidemia, unspecified: Secondary | ICD-10-CM | POA: Diagnosis present

## 2017-11-23 DIAGNOSIS — M19041 Primary osteoarthritis, right hand: Secondary | ICD-10-CM | POA: Diagnosis present

## 2017-11-23 DIAGNOSIS — J449 Chronic obstructive pulmonary disease, unspecified: Secondary | ICD-10-CM | POA: Diagnosis present

## 2017-11-23 DIAGNOSIS — E7800 Pure hypercholesterolemia, unspecified: Secondary | ICD-10-CM | POA: Diagnosis present

## 2017-11-23 DIAGNOSIS — N183 Chronic kidney disease, stage 3 (moderate): Secondary | ICD-10-CM | POA: Diagnosis not present

## 2017-11-23 DIAGNOSIS — Z950 Presence of cardiac pacemaker: Secondary | ICD-10-CM

## 2017-11-23 HISTORY — PX: CARDIAC CATHETERIZATION: SHX172

## 2017-11-23 HISTORY — PX: RIGHT/LEFT HEART CATH AND CORONARY ANGIOGRAPHY: CATH118266

## 2017-11-23 HISTORY — DX: Atherosclerotic heart disease of native coronary artery without angina pectoris: I25.10

## 2017-11-23 LAB — BASIC METABOLIC PANEL
ANION GAP: 9 (ref 5–15)
BUN: 24 mg/dL — ABNORMAL HIGH (ref 6–20)
CO2: 23 mmol/L (ref 22–32)
Calcium: 9 mg/dL (ref 8.9–10.3)
Chloride: 106 mmol/L (ref 101–111)
Creatinine, Ser: 1.42 mg/dL — ABNORMAL HIGH (ref 0.61–1.24)
GFR calc non Af Amer: 47 mL/min — ABNORMAL LOW (ref >60)
GFR, EST AFRICAN AMERICAN: 55 mL/min — AB (ref >60)
GLUCOSE: 113 mg/dL — AB (ref 65–99)
POTASSIUM: 5 mmol/L (ref 3.5–5.1)
Sodium: 138 mmol/L (ref 135–145)

## 2017-11-23 LAB — POCT I-STAT 3, ART BLOOD GAS (G3+)
ACID-BASE DEFICIT: 2 mmol/L (ref 0.0–2.0)
Acid-base deficit: 3 mmol/L — ABNORMAL HIGH (ref 0.0–2.0)
BICARBONATE: 23.8 mmol/L (ref 20.0–28.0)
BICARBONATE: 24 mmol/L (ref 20.0–28.0)
O2 SAT: 99 %
O2 Saturation: 68 %
PCO2 ART: 48.9 mmHg — AB (ref 32.0–48.0)
PH ART: 7.296 — AB (ref 7.350–7.450)
PO2 ART: 40 mmHg — AB (ref 83.0–108.0)
TCO2: 25 mmol/L (ref 22–32)
TCO2: 25 mmol/L (ref 22–32)
pCO2 arterial: 45.2 mmHg (ref 32.0–48.0)
pH, Arterial: 7.333 — ABNORMAL LOW (ref 7.350–7.450)
pO2, Arterial: 138 mmHg — ABNORMAL HIGH (ref 83.0–108.0)

## 2017-11-23 SURGERY — RIGHT/LEFT HEART CATH AND CORONARY ANGIOGRAPHY
Anesthesia: LOCAL

## 2017-11-23 MED ORDER — HEPARIN (PORCINE) IN NACL 2-0.9 UNIT/ML-% IJ SOLN
INTRAMUSCULAR | Status: AC
  Filled 2017-11-23: qty 1000

## 2017-11-23 MED ORDER — ANGIOPLASTY BOOK
Freq: Once | Status: AC
  Administered 2017-11-23: 22:00:00 1
  Filled 2017-11-23: qty 1

## 2017-11-23 MED ORDER — ONDANSETRON HCL 4 MG/2ML IJ SOLN: 4.0000 mg | Freq: Four times a day (QID) | INTRAMUSCULAR | Status: DC | PRN

## 2017-11-23 MED ORDER — METOPROLOL SUCCINATE ER 25 MG PO TB24
25.0000 mg | ORAL_TABLET | Freq: Every day | ORAL | Status: DC
  Administered 2017-11-24 – 2017-11-27 (×4): 25 mg via ORAL
  Filled 2017-11-23 (×5): qty 1

## 2017-11-23 MED ORDER — VERAPAMIL HCL 2.5 MG/ML IV SOLN
INTRAVENOUS | Status: DC | PRN
  Administered 2017-11-23: 10 mL via INTRA_ARTERIAL

## 2017-11-23 MED ORDER — SODIUM CHLORIDE 0.9% FLUSH
3.0000 mL | Freq: Two times a day (BID) | INTRAVENOUS | Status: DC
  Administered 2017-11-24 – 2017-11-28 (×4): 3 mL via INTRAVENOUS

## 2017-11-23 MED ORDER — LIDOCAINE HCL (PF) 1 % IJ SOLN
INTRAMUSCULAR | Status: AC
  Filled 2017-11-23: qty 30

## 2017-11-23 MED ORDER — LIDOCAINE HCL (PF) 1 % IJ SOLN
INTRAMUSCULAR | Status: DC | PRN
  Administered 2017-11-23 (×2): 2 mL via INTRADERMAL

## 2017-11-23 MED ORDER — SODIUM CHLORIDE 0.9 % WEIGHT BASED INFUSION: 1.0000 mL/kg/h | INTRAVENOUS | Status: DC

## 2017-11-23 MED ORDER — EZETIMIBE 10 MG PO TABS
10.0000 mg | ORAL_TABLET | Freq: Every day | ORAL | Status: DC
  Administered 2017-11-25 – 2017-11-28 (×4): 10 mg via ORAL
  Filled 2017-11-23 (×6): qty 1

## 2017-11-23 MED ORDER — FENTANYL CITRATE (PF) 100 MCG/2ML IJ SOLN
INTRAMUSCULAR | Status: DC | PRN
  Administered 2017-11-23: 25 ug via INTRAVENOUS

## 2017-11-23 MED ORDER — ACETAMINOPHEN 325 MG PO TABS
650.0000 mg | ORAL_TABLET | ORAL | Status: DC | PRN
  Filled 2017-11-23: qty 2

## 2017-11-23 MED ORDER — DIGOXIN 125 MCG PO TABS
125.0000 ug | ORAL_TABLET | Freq: Every day | ORAL | Status: DC
  Filled 2017-11-23 (×3): qty 1

## 2017-11-23 MED ORDER — CLOPIDOGREL BISULFATE 75 MG PO TABS
75.0000 mg | ORAL_TABLET | Freq: Every day | ORAL | Status: DC
  Administered 2017-11-24 – 2017-11-28 (×5): 75 mg via ORAL
  Filled 2017-11-23 (×7): qty 1

## 2017-11-23 MED ORDER — SODIUM CHLORIDE 0.9 % IV SOLN: 250.0000 mL | INTRAVENOUS | Status: DC | PRN

## 2017-11-23 MED ORDER — MIDAZOLAM HCL 2 MG/2ML IJ SOLN
INTRAMUSCULAR | Status: DC | PRN
  Administered 2017-11-23: 1 mg via INTRAVENOUS

## 2017-11-23 MED ORDER — SODIUM CHLORIDE 0.9 % WEIGHT BASED INFUSION
3.0000 mL/kg/h | INTRAVENOUS | Status: DC
  Administered 2017-11-23: 3 mL/kg/h via INTRAVENOUS

## 2017-11-23 MED ORDER — IOPAMIDOL (ISOVUE-370) INJECTION 76%
INTRAVENOUS | Status: AC
  Filled 2017-11-23: qty 100

## 2017-11-23 MED ORDER — ASPIRIN 81 MG PO CHEW: 81.0000 mg | CHEWABLE_TABLET | ORAL | Status: DC

## 2017-11-23 MED ORDER — FENTANYL CITRATE (PF) 100 MCG/2ML IJ SOLN
INTRAMUSCULAR | Status: AC
  Filled 2017-11-23: qty 2

## 2017-11-23 MED ORDER — CLOPIDOGREL BISULFATE 75 MG PO TABS
600.0000 mg | ORAL_TABLET | Freq: Once | ORAL | Status: AC
  Administered 2017-11-23: 600 mg via ORAL
  Filled 2017-11-23: qty 8

## 2017-11-23 MED ORDER — HYDRALAZINE HCL 20 MG/ML IJ SOLN
10.0000 mg | Freq: Four times a day (QID) | INTRAMUSCULAR | Status: DC | PRN
  Administered 2017-11-25: 10 mg via INTRAVENOUS
  Filled 2017-11-23: qty 1

## 2017-11-23 MED ORDER — SODIUM CHLORIDE 0.9% FLUSH: 3.0000 mL | INTRAVENOUS | Status: DC | PRN

## 2017-11-23 MED ORDER — SODIUM CHLORIDE 0.9% FLUSH: 3.0000 mL | Freq: Two times a day (BID) | INTRAVENOUS | Status: DC

## 2017-11-23 MED ORDER — ATORVASTATIN CALCIUM 40 MG PO TABS
40.0000 mg | ORAL_TABLET | Freq: Every evening | ORAL | Status: DC
  Administered 2017-11-23 – 2017-11-27 (×4): 40 mg via ORAL
  Filled 2017-11-23 (×5): qty 1

## 2017-11-23 MED ORDER — SODIUM CHLORIDE 0.9 % IV SOLN: INTRAVENOUS | Status: AC

## 2017-11-23 MED ORDER — VERAPAMIL HCL 2.5 MG/ML IV SOLN
INTRAVENOUS | Status: AC
  Filled 2017-11-23: qty 2

## 2017-11-23 MED ORDER — IOPAMIDOL (ISOVUE-370) INJECTION 76%
INTRAVENOUS | Status: DC | PRN
  Administered 2017-11-23: 60 mL via INTRA_ARTERIAL

## 2017-11-23 MED ORDER — MIDAZOLAM HCL 2 MG/2ML IJ SOLN
INTRAMUSCULAR | Status: AC
  Filled 2017-11-23: qty 2

## 2017-11-23 MED ORDER — HYDRALAZINE HCL 25 MG PO TABS
25.0000 mg | ORAL_TABLET | Freq: Three times a day (TID) | ORAL | Status: DC
Start: 2017-11-23 — End: 2017-11-25
  Administered 2017-11-23 – 2017-11-25 (×6): 25 mg via ORAL
  Filled 2017-11-23 (×6): qty 1

## 2017-11-23 MED ORDER — SODIUM CHLORIDE 0.9% FLUSH
3.0000 mL | INTRAVENOUS | Status: DC | PRN
  Administered 2017-11-26: 3 mL via INTRAVENOUS
  Filled 2017-11-23: qty 3

## 2017-11-23 MED ORDER — HEPARIN (PORCINE) IN NACL 2-0.9 UNIT/ML-% IJ SOLN
INTRAMUSCULAR | Status: AC | PRN
  Administered 2017-11-23: 1000 mL via INTRA_ARTERIAL

## 2017-11-23 MED ORDER — HEPARIN SODIUM (PORCINE) 1000 UNIT/ML IJ SOLN
INTRAMUSCULAR | Status: DC | PRN
  Administered 2017-11-23: 3500 [IU] via INTRAVENOUS

## 2017-11-23 MED ORDER — HEPARIN SODIUM (PORCINE) 1000 UNIT/ML IJ SOLN
INTRAMUSCULAR | Status: AC
  Filled 2017-11-23: qty 1

## 2017-11-23 SURGICAL SUPPLY — 15 items
CATH 5FR JL3.5 JR4 ANG PIG MP (CATHETERS) ×2 IMPLANT
CATH BALLN WEDGE 5F 110CM (CATHETERS) ×2 IMPLANT
CATH EXPO 5F MPA-1 (CATHETERS) ×2 IMPLANT
CATH INFINITI 5 FR 3DRC (CATHETERS) ×2 IMPLANT
CATH INFINITI 5FR AL1 (CATHETERS) ×2 IMPLANT
DEVICE RAD COMP TR BAND LRG (VASCULAR PRODUCTS) ×2 IMPLANT
GLIDESHEATH SLEND SS 6F .021 (SHEATH) ×2 IMPLANT
GUIDEWIRE .025 260CM (WIRE) ×2 IMPLANT
GUIDEWIRE INQWIRE 1.5J.035X260 (WIRE) ×1 IMPLANT
INQWIRE 1.5J .035X260CM (WIRE) ×2
KIT HEART LEFT (KITS) ×2 IMPLANT
PACK CARDIAC CATHETERIZATION (CUSTOM PROCEDURE TRAY) ×2 IMPLANT
SHEATH GLIDE SLENDER 4/5FR (SHEATH) ×2 IMPLANT
TRANSDUCER W/STOPCOCK (MISCELLANEOUS) ×2 IMPLANT
TUBING CIL FLEX 10 FLL-RA (TUBING) ×2 IMPLANT

## 2017-11-23 NOTE — Progress Notes (Signed)
TR BAND REMOVAL  LOCATION:  right radial  DEFLATED PER PROTOCOL:  Yes.    TIME BAND OFF / DRESSING APPLIED:   1800   SITE UPON ARRIVAL:   Level 0  SITE AFTER BAND REMOVAL:  Level 0  CIRCULATION SENSATION AND MOVEMENT:  Within Normal Limits  Yes.    COMMENTS:    

## 2017-11-23 NOTE — Interval H&P Note (Signed)
History and Physical Interval Note:  11/23/2017 10:11 AM  Ricardo Garrett  has presented today for cardiac cath with the diagnosis of abn nuclear stress test, unstable angina. The various methods of treatment have been discussed with the patient and family. After consideration of risks, benefits and other options for treatment, the patient has consented to  Procedure(s): RIGHT/LEFT HEART CATH AND CORONARY ANGIOGRAPHY (N/A) as a surgical intervention .  The patient's history has been reviewed, patient examined, no change in status, stable for surgery.  I have reviewed the patient's chart and labs.  Questions were answered to the patient's satisfaction.    Cath Lab Visit (complete for each Cath Lab visit)  Clinical Evaluation Leading to the Procedure:   ACS: No.  Non-ACS:    Anginal Classification: CCS III  Anti-ischemic medical therapy: Minimal Therapy (1 class of medications)  Non-Invasive Test Results: Intermediate-risk stress test findings: cardiac mortality 1-3%/year  Prior CABG: No previous CABG         Lauree Chandler

## 2017-11-24 ENCOUNTER — Encounter (HOSPITAL_COMMUNITY): Payer: Self-pay | Admitting: Cardiovascular Disease

## 2017-11-24 ENCOUNTER — Inpatient Hospital Stay (HOSPITAL_COMMUNITY)
Admission: RE | Disposition: A | Discharge status: Home / Self Care | Payer: Self-pay | Source: Ambulatory Visit | Attending: Cardiovascular Disease

## 2017-11-24 DIAGNOSIS — I2582 Chronic total occlusion of coronary artery: Secondary | ICD-10-CM | POA: Diagnosis not present

## 2017-11-24 DIAGNOSIS — I481 Persistent atrial fibrillation: Secondary | ICD-10-CM | POA: Diagnosis not present

## 2017-11-24 DIAGNOSIS — N183 Chronic kidney disease, stage 3 (moderate): Secondary | ICD-10-CM | POA: Diagnosis not present

## 2017-11-24 DIAGNOSIS — I25110 Atherosclerotic heart disease of native coronary artery with unstable angina pectoris: Secondary | ICD-10-CM | POA: Diagnosis not present

## 2017-11-24 DIAGNOSIS — I495 Sick sinus syndrome: Secondary | ICD-10-CM | POA: Diagnosis not present

## 2017-11-24 DIAGNOSIS — I5042 Chronic combined systolic (congestive) and diastolic (congestive) heart failure: Secondary | ICD-10-CM | POA: Diagnosis not present

## 2017-11-24 DIAGNOSIS — I442 Atrioventricular block, complete: Secondary | ICD-10-CM | POA: Diagnosis not present

## 2017-11-24 DIAGNOSIS — I482 Chronic atrial fibrillation: Secondary | ICD-10-CM | POA: Diagnosis not present

## 2017-11-24 DIAGNOSIS — I130 Hypertensive heart and chronic kidney disease with heart failure and stage 1 through stage 4 chronic kidney disease, or unspecified chronic kidney disease: Secondary | ICD-10-CM | POA: Diagnosis not present

## 2017-11-24 DIAGNOSIS — I255 Ischemic cardiomyopathy: Secondary | ICD-10-CM | POA: Diagnosis not present

## 2017-11-24 HISTORY — PX: CORONARY BALLOON ANGIOPLASTY: CATH118233

## 2017-11-24 HISTORY — PX: CORONARY ATHERECTOMY: CATH118238

## 2017-11-24 HISTORY — PX: CORONARY STENT INTERVENTION: CATH118234

## 2017-11-24 LAB — CBC
HCT: 44.2 % (ref 39.0–52.0)
HEMOGLOBIN: 15.2 g/dL (ref 13.0–17.0)
MCH: 32.7 pg (ref 26.0–34.0)
MCHC: 34.4 g/dL (ref 30.0–36.0)
MCV: 95.1 fL (ref 78.0–100.0)
Platelets: 180 10*3/uL (ref 150–400)
RBC: 4.65 MIL/uL (ref 4.22–5.81)
RDW: 13.3 % (ref 11.5–15.5)
WBC: 7.3 10*3/uL (ref 4.0–10.5)

## 2017-11-24 LAB — BASIC METABOLIC PANEL
ANION GAP: 8 (ref 5–15)
BUN: 20 mg/dL (ref 6–20)
CO2: 22 mmol/L (ref 22–32)
Calcium: 8.5 mg/dL — ABNORMAL LOW (ref 8.9–10.3)
Chloride: 108 mmol/L (ref 101–111)
Creatinine, Ser: 1.3 mg/dL — ABNORMAL HIGH (ref 0.61–1.24)
GFR, EST NON AFRICAN AMERICAN: 53 mL/min — AB (ref >60)
Glucose, Bld: 104 mg/dL — ABNORMAL HIGH (ref 65–99)
POTASSIUM: 4.4 mmol/L (ref 3.5–5.1)
SODIUM: 138 mmol/L (ref 135–145)

## 2017-11-24 LAB — POCT ACTIVATED CLOTTING TIME
ACTIVATED CLOTTING TIME: 461 s
ACTIVATED CLOTTING TIME: 378 s

## 2017-11-24 SURGERY — CORONARY ATHERECTOMY
Anesthesia: LOCAL

## 2017-11-24 MED ORDER — VERAPAMIL HCL 2.5 MG/ML IV SOLN
INTRAVENOUS | Status: DC | PRN
  Administered 2017-11-24: 10 mL via INTRA_ARTERIAL

## 2017-11-24 MED ORDER — FENTANYL CITRATE (PF) 100 MCG/2ML IJ SOLN
INTRAMUSCULAR | Status: DC | PRN
  Administered 2017-11-24 (×2): 25 ug via INTRAVENOUS

## 2017-11-24 MED ORDER — ASPIRIN EC 81 MG PO TBEC
81.0000 mg | DELAYED_RELEASE_TABLET | Freq: Every day | ORAL | Status: DC
  Administered 2017-11-25 – 2017-11-28 (×4): 81 mg via ORAL
  Filled 2017-11-24 (×4): qty 1

## 2017-11-24 MED ORDER — NITROGLYCERIN 1 MG/10 ML FOR IR/CATH LAB
INTRA_ARTERIAL | Status: AC
  Filled 2017-11-24: qty 10

## 2017-11-24 MED ORDER — MIDAZOLAM HCL 2 MG/2ML IJ SOLN
INTRAMUSCULAR | Status: DC | PRN
  Administered 2017-11-24: 1 mg via INTRAVENOUS
  Administered 2017-11-24: 2 mg via INTRAVENOUS

## 2017-11-24 MED ORDER — SODIUM CHLORIDE 0.9 % WEIGHT BASED INFUSION: 1.0000 mL/kg/h | INTRAVENOUS | Status: DC

## 2017-11-24 MED ORDER — SODIUM CHLORIDE 0.9 % IV SOLN: 250.0000 mL | INTRAVENOUS | Status: DC | PRN

## 2017-11-24 MED ORDER — VERAPAMIL HCL 2.5 MG/ML IV SOLN
INTRAVENOUS | Status: AC
  Filled 2017-11-24: qty 2

## 2017-11-24 MED ORDER — HEPARIN (PORCINE) IN NACL 2-0.9 UNIT/ML-% IJ SOLN
INTRAMUSCULAR | Status: DC | PRN
  Administered 2017-11-24: 10:00:00

## 2017-11-24 MED ORDER — HEPARIN SODIUM (PORCINE) 1000 UNIT/ML IJ SOLN
INTRAMUSCULAR | Status: DC | PRN
  Administered 2017-11-24: 10000 [IU] via INTRAVENOUS

## 2017-11-24 MED ORDER — SODIUM CHLORIDE 0.9% FLUSH: 3.0000 mL | INTRAVENOUS | Status: DC | PRN

## 2017-11-24 MED ORDER — NITROGLYCERIN 1 MG/10 ML FOR IR/CATH LAB
INTRA_ARTERIAL | Status: DC | PRN
  Administered 2017-11-24 (×3): 200 ug via INTRACORONARY

## 2017-11-24 MED ORDER — SODIUM CHLORIDE 0.9 % IV SOLN
INTRAVENOUS | Status: AC
  Administered 2017-11-24: 12:00:00 via INTRAVENOUS

## 2017-11-24 MED ORDER — FENTANYL CITRATE (PF) 100 MCG/2ML IJ SOLN
INTRAMUSCULAR | Status: AC
Start: 2017-11-24 — End: 2017-11-24
  Filled 2017-11-24: qty 2

## 2017-11-24 MED ORDER — SODIUM CHLORIDE 0.9 % WEIGHT BASED INFUSION
3.0000 mL/kg/h | INTRAVENOUS | Status: DC
  Administered 2017-11-24: 3 mL/kg/h via INTRAVENOUS

## 2017-11-24 MED ORDER — ASPIRIN 81 MG PO CHEW
81.0000 mg | CHEWABLE_TABLET | ORAL | Status: AC
  Administered 2017-11-24: 81 mg via ORAL
  Filled 2017-11-24: qty 1

## 2017-11-24 MED ORDER — SALINE SPRAY 0.65 % NA SOLN
1.0000 | NASAL | Status: DC | PRN
  Administered 2017-11-24: 03:00:00 1 via NASAL
  Filled 2017-11-24: qty 44

## 2017-11-24 MED ORDER — HYDRALAZINE HCL 20 MG/ML IJ SOLN: 5.0000 mg | INTRAMUSCULAR | Status: DC | PRN

## 2017-11-24 MED ORDER — MIDAZOLAM HCL 2 MG/2ML IJ SOLN
INTRAMUSCULAR | Status: AC
  Filled 2017-11-24: qty 2

## 2017-11-24 MED ORDER — LIDOCAINE HCL (PF) 1 % IJ SOLN
INTRAMUSCULAR | Status: DC | PRN
  Administered 2017-11-24: 2 mL

## 2017-11-24 MED ORDER — ANGIOPLASTY BOOK
Freq: Once | Status: AC
  Filled 2017-11-24: qty 1

## 2017-11-24 MED ORDER — SODIUM CHLORIDE 0.9 % IV SOLN
INTRAVENOUS | Status: DC | PRN
  Administered 2017-11-24: 500 mL via SURGICAL_CAVITY

## 2017-11-24 MED ORDER — IOPAMIDOL (ISOVUE-370) INJECTION 76%
INTRAVENOUS | Status: DC | PRN
  Administered 2017-11-24: 145 mL

## 2017-11-24 MED ORDER — LIDOCAINE HCL 1 % IJ SOLN
INTRAMUSCULAR | Status: AC
  Filled 2017-11-24: qty 20

## 2017-11-24 MED ORDER — SODIUM CHLORIDE 0.9% FLUSH
3.0000 mL | Freq: Two times a day (BID) | INTRAVENOUS | Status: DC
  Administered 2017-11-24 – 2017-11-26 (×2): 3 mL via INTRAVENOUS

## 2017-11-24 MED ORDER — SODIUM CHLORIDE 0.9% FLUSH
3.0000 mL | Freq: Two times a day (BID) | INTRAVENOUS | Status: DC
  Administered 2017-11-24: 3 mL via INTRAVENOUS

## 2017-11-24 MED ORDER — IOPAMIDOL (ISOVUE-370) INJECTION 76%
INTRAVENOUS | Status: AC
  Filled 2017-11-24: qty 125

## 2017-11-24 MED ORDER — HEPARIN (PORCINE) IN NACL 2-0.9 UNIT/ML-% IJ SOLN
INTRAMUSCULAR | Status: AC
  Filled 2017-11-24: qty 500

## 2017-11-24 MED ORDER — LABETALOL HCL 5 MG/ML IV SOLN: 10.0000 mg | INTRAVENOUS | Status: AC | PRN

## 2017-11-24 SURGICAL SUPPLY — 22 items
BALLN SAPPHIRE 2.0X12 (BALLOONS) ×2
BALLN SAPPHIRE 2.5X15 (BALLOONS) ×2
BALLN SAPPHIRE ~~LOC~~ 2.75X10 (BALLOONS) ×2 IMPLANT
BALLOON SAPPHIRE 2.0X12 (BALLOONS) ×1 IMPLANT
BALLOON SAPPHIRE 2.5X15 (BALLOONS) ×1 IMPLANT
CATH VISTA GUIDE 6FR XBLAD3.5 (CATHETERS) ×2 IMPLANT
CROWN DIAMONDBACK CLASSIC 1.25 (BURR) ×2 IMPLANT
DEVICE RAD COMP TR BAND LRG (VASCULAR PRODUCTS) ×2 IMPLANT
ELECT DEFIB PAD ADLT CADENCE (PAD) ×2 IMPLANT
GLIDESHEATH SLEND SS 6F .021 (SHEATH) ×2 IMPLANT
GUIDEWIRE INQWIRE 1.5J.035X260 (WIRE) ×1 IMPLANT
INQWIRE 1.5J .035X260CM (WIRE) ×2
KIT ENCORE 26 ADVANTAGE (KITS) ×4 IMPLANT
KIT HEART LEFT (KITS) ×2 IMPLANT
LUBRICANT VIPERSLIDE CORONARY (MISCELLANEOUS) ×2 IMPLANT
PACK CARDIAC CATHETERIZATION (CUSTOM PROCEDURE TRAY) ×2 IMPLANT
STENT SYNERGY DES 2.5X16 (Permanent Stent) ×2 IMPLANT
TRANSDUCER W/STOPCOCK (MISCELLANEOUS) ×2 IMPLANT
TUBING CIL FLEX 10 FLL-RA (TUBING) ×2 IMPLANT
WIRE COUGAR XT STRL 190CM (WIRE) ×4 IMPLANT
WIRE COUGAR XT STRL 300CM (WIRE) ×2 IMPLANT
WIRE VIPER ADVANCE COR .012TIP (WIRE) ×2 IMPLANT

## 2017-11-24 NOTE — H&P (View-Only) (Signed)
Progress Note  Patient Name: Ricardo Garrett Date of Encounter: 11/24/2017  Primary Cardiologist: Johnsie Cancel  Subjective   No chest pain this am  Inpatient Medications    Scheduled Meds: . [MAR Hold] atorvastatin  40 mg Oral QPM  . [MAR Hold] clopidogrel  75 mg Oral Daily  . [MAR Hold] digoxin  125 mcg Oral Daily  . [MAR Hold] ezetimibe  10 mg Oral Daily  . [MAR Hold] hydrALAZINE  25 mg Oral Q8H  . [MAR Hold] metoprolol succinate  25 mg Oral Daily  . [MAR Hold] sodium chloride flush  3 mL Intravenous Q12H  . sodium chloride flush  3 mL Intravenous Q12H   Continuous Infusions: . [MAR Hold] sodium chloride    . sodium chloride    . sodium chloride 1 mL/kg/hr (11/24/17 0700)   PRN Meds: [MAR Hold] sodium chloride, sodium chloride, [MAR Hold] acetaminophen, fentaNYL, [MAR Hold] hydrALAZINE, midazolam, [MAR Hold] ondansetron (ZOFRAN) IV, [MAR Hold] sodium chloride, [MAR Hold] sodium chloride flush, sodium chloride flush   Vital Signs    Vitals:   11/23/17 2000 11/24/17 0234 11/24/17 0824 11/24/17 0846  BP:  (!) 143/94    Pulse:  79 (P) 92   Resp: (!) 0 13    Temp:  97.7 F (36.5 C)    TempSrc:  Oral    SpO2:  99%  100%  Weight:      Height:        Intake/Output Summary (Last 24 hours) at 11/24/2017 0854 Last data filed at 11/24/2017 0200 Gross per 24 hour  Intake 802.5 ml  Output 600 ml  Net 202.5 ml   Filed Weights   11/23/17 0712  Weight: 152 lb (68.9 kg)    Telemetry    Atrial fib - Personally Reviewed  ECG      Physical Exam   GEN: No acute distress.   Neck: No JVD Cardiac: irreg irreg. no murmurs, rubs, or gallops.  Respiratory: Clear to auscultation bilaterally. GI: Soft, nontender, non-distended  MS: No edema; No deformity. Neuro:  Nonfocal  Psych: Normal affect  Right radial cath site without hematoma  Labs    Chemistry Recent Labs  Lab 11/23/17 0728 11/24/17 0230  NA 138 138  K 5.0 4.4  CL 106 108  CO2 23 22  GLUCOSE 113* 104*    BUN 24* 20  CREATININE 1.42* 1.30*  CALCIUM 9.0 8.5*  GFRNONAA 47* 53*  GFRAA 55* >60  ANIONGAP 9 8     Hematology Recent Labs  Lab 11/24/17 0230  WBC 7.3  RBC 4.65  HGB 15.2  HCT 44.2  MCV 95.1  MCH 32.7  MCHC 34.4  RDW 13.3  PLT 180    Cardiac EnzymesNo results for input(s): TROPONINI in the last 168 hours. No results for input(s): TROPIPOC in the last 168 hours.   BNPNo results for input(s): BNP, PROBNP in the last 168 hours.   DDimer No results for input(s): DDIMER in the last 168 hours.   Radiology    No results found.  Cardiac Studies    Patient Profile     72 y.o. male with history of atrial fib, chronic combined CHF, ischemic cardiomyopathy, CAD with cath yesterday showing CTO of the RCA with good collaterals and severe stenosis of the mid Circumflex artery. Plans for orbital atherectomy of the Circumflex artery today.   Assessment & Plan    1. CAD with unstable angina: Stable this am. Plans for orbital atherectomy of the Circumflex artery  today with stenting.   For questions or updates, please contact [place] Please consult [email or web address] for contact info under Cardiology/STEMI.      Signed, Lauree Chandler, MD  11/24/2017, 8:54 AM

## 2017-11-24 NOTE — Progress Notes (Signed)
TR BAND REMOVAL  LOCATION:  right radial  DEFLATED PER PROTOCOL:  Yes.    TIME BAND OFF / DRESSING APPLIED:   1415   SITE UPON ARRIVAL:   Level 0  SITE AFTER BAND REMOVAL:  Level 1  CIRCULATION SENSATION AND MOVEMENT:  Within Normal Limits  Yes.    COMMENTS:

## 2017-11-24 NOTE — Progress Notes (Signed)
Progress Note  Patient Name: Ricardo Garrett Date of Encounter: 11/24/2017  Primary Cardiologist: Johnsie Cancel  Subjective   No chest pain this am  Inpatient Medications    Scheduled Meds: . [MAR Hold] atorvastatin  40 mg Oral QPM  . [MAR Hold] clopidogrel  75 mg Oral Daily  . [MAR Hold] digoxin  125 mcg Oral Daily  . [MAR Hold] ezetimibe  10 mg Oral Daily  . [MAR Hold] hydrALAZINE  25 mg Oral Q8H  . [MAR Hold] metoprolol succinate  25 mg Oral Daily  . [MAR Hold] sodium chloride flush  3 mL Intravenous Q12H  . sodium chloride flush  3 mL Intravenous Q12H   Continuous Infusions: . [MAR Hold] sodium chloride    . sodium chloride    . sodium chloride 1 mL/kg/hr (11/24/17 0700)   PRN Meds: [MAR Hold] sodium chloride, sodium chloride, [MAR Hold] acetaminophen, fentaNYL, [MAR Hold] hydrALAZINE, midazolam, [MAR Hold] ondansetron (ZOFRAN) IV, [MAR Hold] sodium chloride, [MAR Hold] sodium chloride flush, sodium chloride flush   Vital Signs    Vitals:   11/23/17 2000 11/24/17 0234 11/24/17 0824 11/24/17 0846  BP:  (!) 143/94    Pulse:  79 (P) 92   Resp: (!) 0 13    Temp:  97.7 F (36.5 C)    TempSrc:  Oral    SpO2:  99%  100%  Weight:      Height:        Intake/Output Summary (Last 24 hours) at 11/24/2017 0854 Last data filed at 11/24/2017 0200 Gross per 24 hour  Intake 802.5 ml  Output 600 ml  Net 202.5 ml   Filed Weights   11/23/17 0712  Weight: 152 lb (68.9 kg)    Telemetry    Atrial fib - Personally Reviewed  ECG      Physical Exam   GEN: No acute distress.   Neck: No JVD Cardiac: irreg irreg. no murmurs, rubs, or gallops.  Respiratory: Clear to auscultation bilaterally. GI: Soft, nontender, non-distended  MS: No edema; No deformity. Neuro:  Nonfocal  Psych: Normal affect  Right radial cath site without hematoma  Labs    Chemistry Recent Labs  Lab 11/23/17 0728 11/24/17 0230  NA 138 138  K 5.0 4.4  CL 106 108  CO2 23 22  GLUCOSE 113* 104*    BUN 24* 20  CREATININE 1.42* 1.30*  CALCIUM 9.0 8.5*  GFRNONAA 47* 53*  GFRAA 55* >60  ANIONGAP 9 8     Hematology Recent Labs  Lab 11/24/17 0230  WBC 7.3  RBC 4.65  HGB 15.2  HCT 44.2  MCV 95.1  MCH 32.7  MCHC 34.4  RDW 13.3  PLT 180    Cardiac EnzymesNo results for input(s): TROPONINI in the last 168 hours. No results for input(s): TROPIPOC in the last 168 hours.   BNPNo results for input(s): BNP, PROBNP in the last 168 hours.   DDimer No results for input(s): DDIMER in the last 168 hours.   Radiology    No results found.  Cardiac Studies    Patient Profile     72 y.o. male with history of atrial fib, chronic combined CHF, ischemic cardiomyopathy, CAD with cath yesterday showing CTO of the RCA with good collaterals and severe stenosis of the mid Circumflex artery. Plans for orbital atherectomy of the Circumflex artery today.   Assessment & Plan    1. CAD with unstable angina: Stable this am. Plans for orbital atherectomy of the Circumflex artery  today with stenting.   For questions or updates, please contact [place] Please consult [email or web address] for contact info under Cardiology/STEMI.      Signed, Lauree Chandler, MD  11/24/2017, 8:54 AM

## 2017-11-24 NOTE — Interval H&P Note (Signed)
History and Physical Interval Note:  11/24/2017 8:58 AM  Ricardo Garrett  has presented today for cardiac cath with the diagnosis of CAD with unstable angina. Staged PCI of Circumflex today The various methods of treatment have been discussed with the patient and family. After consideration of risks, benefits and other options for treatment, the patient has consented to  Procedure(s): CORONARY ATHERECTOMY (N/A) as a surgical intervention .  The patient's history has been reviewed, patient examined, no change in status, stable for surgery.  I have reviewed the patient's chart and labs.  Questions were answered to the patient's satisfaction.    Cath Lab Visit (complete for each Cath Lab visit)  Clinical Evaluation Leading to the Procedure:   ACS: No.  Non-ACS:    Anginal Classification: CCS III  Anti-ischemic medical therapy: Minimal Therapy (1 class of medications)  Non-Invasive Test Results: Intermediate-risk stress test findings: cardiac mortality 1-3%/year  Prior CABG: No previous CABG         Lauree Chandler

## 2017-11-25 ENCOUNTER — Ambulatory Visit (HOSPITAL_BASED_OUTPATIENT_CLINIC_OR_DEPARTMENT_OTHER): Payer: Medicare HMO

## 2017-11-25 DIAGNOSIS — I5042 Chronic combined systolic (congestive) and diastolic (congestive) heart failure: Secondary | ICD-10-CM | POA: Diagnosis not present

## 2017-11-25 DIAGNOSIS — I25110 Atherosclerotic heart disease of native coronary artery with unstable angina pectoris: Secondary | ICD-10-CM | POA: Diagnosis not present

## 2017-11-25 DIAGNOSIS — N183 Chronic kidney disease, stage 3 (moderate): Secondary | ICD-10-CM | POA: Diagnosis not present

## 2017-11-25 DIAGNOSIS — I361 Nonrheumatic tricuspid (valve) insufficiency: Secondary | ICD-10-CM

## 2017-11-25 DIAGNOSIS — I255 Ischemic cardiomyopathy: Secondary | ICD-10-CM | POA: Diagnosis not present

## 2017-11-25 DIAGNOSIS — I482 Chronic atrial fibrillation: Secondary | ICD-10-CM | POA: Diagnosis not present

## 2017-11-25 DIAGNOSIS — I2582 Chronic total occlusion of coronary artery: Secondary | ICD-10-CM | POA: Diagnosis not present

## 2017-11-25 DIAGNOSIS — I442 Atrioventricular block, complete: Secondary | ICD-10-CM | POA: Diagnosis not present

## 2017-11-25 DIAGNOSIS — I200 Unstable angina: Secondary | ICD-10-CM | POA: Diagnosis not present

## 2017-11-25 DIAGNOSIS — I481 Persistent atrial fibrillation: Secondary | ICD-10-CM | POA: Diagnosis not present

## 2017-11-25 DIAGNOSIS — I495 Sick sinus syndrome: Secondary | ICD-10-CM | POA: Diagnosis not present

## 2017-11-25 DIAGNOSIS — I130 Hypertensive heart and chronic kidney disease with heart failure and stage 1 through stage 4 chronic kidney disease, or unspecified chronic kidney disease: Secondary | ICD-10-CM | POA: Diagnosis not present

## 2017-11-25 LAB — BASIC METABOLIC PANEL
Anion gap: 8 (ref 5–15)
BUN: 17 mg/dL (ref 6–20)
CHLORIDE: 107 mmol/L (ref 101–111)
CO2: 22 mmol/L (ref 22–32)
CREATININE: 1.24 mg/dL (ref 0.61–1.24)
Calcium: 8.9 mg/dL (ref 8.9–10.3)
GFR calc non Af Amer: 56 mL/min — ABNORMAL LOW (ref >60)
Glucose, Bld: 116 mg/dL — ABNORMAL HIGH (ref 65–99)
Potassium: 4.7 mmol/L (ref 3.5–5.1)
Sodium: 137 mmol/L (ref 135–145)

## 2017-11-25 LAB — CBC
HCT: 45.9 % (ref 39.0–52.0)
Hemoglobin: 15.5 g/dL (ref 13.0–17.0)
MCH: 32.2 pg (ref 26.0–34.0)
MCHC: 33.8 g/dL (ref 30.0–36.0)
MCV: 95.2 fL (ref 78.0–100.0)
PLATELETS: 194 10*3/uL (ref 150–400)
RBC: 4.82 MIL/uL (ref 4.22–5.81)
RDW: 13.1 % (ref 11.5–15.5)
WBC: 8.9 10*3/uL (ref 4.0–10.5)

## 2017-11-25 LAB — HEPARIN LEVEL (UNFRACTIONATED): Heparin Unfractionated: 0.16 IU/mL — ABNORMAL LOW (ref 0.30–0.70)

## 2017-11-25 LAB — ECHOCARDIOGRAM COMPLETE
Height: 68 in
Weight: 2507.95 oz

## 2017-11-25 MED ORDER — HEPARIN BOLUS VIA INFUSION
2000.0000 [IU] | Freq: Once | INTRAVENOUS | Status: AC
  Administered 2017-11-25: 2000 [IU] via INTRAVENOUS
  Filled 2017-11-25: qty 2000

## 2017-11-25 MED ORDER — LOSARTAN POTASSIUM 50 MG PO TABS
50.0000 mg | ORAL_TABLET | Freq: Every day | ORAL | Status: DC
  Administered 2017-11-25 – 2017-11-28 (×4): 50 mg via ORAL
  Filled 2017-11-25 (×5): qty 1

## 2017-11-25 MED ORDER — HEPARIN (PORCINE) IN NACL 100-0.45 UNIT/ML-% IJ SOLN
1200.0000 [IU]/h | INTRAMUSCULAR | Status: DC
  Administered 2017-11-25: 10:00:00 1000 [IU]/h via INTRAVENOUS
  Administered 2017-11-26 (×2): 1200 [IU]/h via INTRAVENOUS
  Filled 2017-11-25 (×3): qty 250

## 2017-11-25 MED ORDER — HEPARIN BOLUS VIA INFUSION
4000.0000 [IU] | Freq: Once | INTRAVENOUS | Status: AC
Start: 2017-11-25 — End: 2017-11-25
  Administered 2017-11-25: 4000 [IU] via INTRAVENOUS
  Filled 2017-11-25: qty 4000

## 2017-11-25 NOTE — Progress Notes (Addendum)
CARDIAC REHAB PHASE I   PRE:  Rate/Rhythm: 117 Afib    MODE:  Ambulation: 24ft   POST:  Rate/Rhythm: 132 Afib   BP: Standing: 141/77   1449-1615  Pt ambulated 54ft with one person A, gait belt and IV pole,SBA. Pt HR 130's Afib. Pt assisted back to bed. Pt c/o dizziness and headache, given water, elevated head and feet. Pt felt better. Did education with patient and family. Discussed antiplatelet therapy, risk factors, activity restrictions, and emergency action. Gave pt exercise guidelines and HH diet. Family members voiced understanding. Pt is HOH and often times information was repeated for clarity and understanding. CRPII to [facility].  Ichelle Harral D Seann Genther,MS,ACSM-RCEP 11/25/2017 4:20 PM

## 2017-11-25 NOTE — Progress Notes (Signed)
Pt is hard of hearing and may not be able to fully understand plans for his care. Patient and family is requesting that they be around when discussing about his care. Pls call ahead for heads up so family can be present. thanks

## 2017-11-25 NOTE — Progress Notes (Signed)
Progress Note  Patient Name: Ricardo Garrett Date of Encounter: 11/25/2017  Primary Cardiologist: Dr Johnsie Cancel  Subjective   No chest pain or dyspnea  Inpatient Medications    Scheduled Meds: . aspirin EC  81 mg Oral Daily  . atorvastatin  40 mg Oral QPM  . clopidogrel  75 mg Oral Daily  . digoxin  125 mcg Oral Daily  . ezetimibe  10 mg Oral Daily  . hydrALAZINE  25 mg Oral Q8H  . metoprolol succinate  25 mg Oral Daily  . sodium chloride flush  3 mL Intravenous Q12H  . sodium chloride flush  3 mL Intravenous Q12H   Continuous Infusions: . sodium chloride    . sodium chloride     PRN Meds: sodium chloride, sodium chloride, acetaminophen, hydrALAZINE, ondansetron (ZOFRAN) IV, sodium chloride, sodium chloride flush, sodium chloride flush   Vital Signs    Vitals:   11/24/17 1800 11/24/17 1937 11/24/17 2200 11/25/17 0328  BP: (!) 151/96 137/79  (!) 151/85  Pulse:  90  88  Resp: (!) 22 14 12 20   Temp:  98.3 F (36.8 C)  97.9 F (36.6 C)  TempSrc:  Oral  Oral  SpO2:  99%  97%  Weight:    156 lb 12 oz (71.1 kg)  Height:        Intake/Output Summary (Last 24 hours) at 11/25/2017 0740 Last data filed at 11/25/2017 0737 Gross per 24 hour  Intake 862.5 ml  Output 1175 ml  Net -312.5 ml   Filed Weights   11/23/17 0712 11/25/17 0328  Weight: 152 lb (68.9 kg) 156 lb 12 oz (71.1 kg)    Telemetry    Atrial fibrillation with tachy brady (HR 160s at times; one 3.2 sec pause- Personally Reviewed   Physical Exam   GEN: No acute distress.   Neck: No JVD Cardiac: irregular Respiratory: Clear to auscultation bilaterally. GI: Soft, nontender, non-distended  MS: No edema; No deformity. Neuro:  Nonfocal  Psych: Normal affect   Labs    Chemistry Recent Labs  Lab 11/23/17 0728 11/24/17 0230 11/25/17 0617  NA 138 138 137  K 5.0 4.4 4.7  CL 106 108 107  CO2 23 22 22   GLUCOSE 113* 104* 116*  BUN 24* 20 17  CREATININE 1.42* 1.30* 1.24  CALCIUM 9.0 8.5* 8.9    GFRNONAA 47* 53* 56*  GFRAA 55* >60 >60  ANIONGAP 9 8 8      Hematology Recent Labs  Lab 11/24/17 0230 11/25/17 0617  WBC 7.3 8.9  RBC 4.65 4.82  HGB 15.2 15.5  HCT 44.2 45.9  MCV 95.1 95.2  MCH 32.7 32.2  MCHC 34.4 33.8  RDW 13.3 13.1  PLT 180 194   Patient Profile     72 y.o. male with history of permanent atrial fib, chronic combined CHF, ischemic cardiomyopathy, CAD with cath showing CTO of the RCA with good collaterals and severe stenosis of the mid Circumflex artery. Had PCI of left circumflex 1/25.  On telemetry patient having episodes of tachybradycardia with heart rates up to 160 and pause greater than 3 seconds.    Assessment & Plan    1 coronary artery disease-status post PCI of circumflex.  No chest pain.  Continue aspirin, Plavix and statin.  Given need for anticoagulation for atrial fibrillation plan is to continue dual antiplatelet therapy for 30 days and then discontinue aspirin with continuation of Plavix long-term.  2 permanent atrial fibrillation-patient is noted to have tachybradycardia on telemetry.  His heart rate increases to 140-160 with activities and he is also having pauses greater than 3 seconds.  He will likely need a pacemaker.  I will ask electrophysiology to review.  We will continue Toprol.  Digoxin discontinued.  Follow telemetry. Add IV heparin while off oral anticoagulation. Will need to resume apixaban once decision concerning pacemaker made.  We will repeat echocardiogram.  3 cardiomyopathy-question contribution from tachycardia.  Also may be a contribution from ischemia.  Continue Toprol.  Resume losartan 50 mg daily.  Titrate medications as needed.    4 chronic stage III kidney disease-renal function has improved with holding diuretic.  Not clear patient needs a diuretic at this point.  He is not volume overloaded.  We will continue to hold and follow renal function.  5 tachybradycardia-plan as outlined under #2.  6 hypertension-resume  losartan 50 mg daily and follow. DC hydralazine  For questions or updates, please contact [place] Please consult [email or web address] for contact info under Cardiology/STEMI.      Signed, Kirk Ruths, MD  11/25/2017, 7:40 AM

## 2017-11-25 NOTE — Progress Notes (Signed)
  Echocardiogram 2D Echocardiogram has been performed.  Darlina Sicilian M 11/25/2017, 10:09 AM

## 2017-11-25 NOTE — Care Management Note (Signed)
Case Management Note  Patient Details  Name: CHISTIAN KASLER MRN: [id number] Date of Birth: Mar 14, 1946  Subjective/Objective:   From home with wife, pta indep, has PCP and medication coverage.  Presents with Atrial fibrillation with tachy brady (HR 160s at times; one 3.2 sec pause per MD note.                    Action/Plan: NCM will follow for dc needs.   Expected Discharge Date:                  Expected Discharge Plan:     In-House Referral:     Discharge planning Services  CM Consult  Post Acute Care Choice:    Choice offered to:     DME Arranged:    DME Agency:     HH Arranged:    HH Agency:     Status of Service:  In process, will continue to follow  If discussed at Long Length of Stay Meetings, dates discussed:    Additional Comments:  Zenon Mayo, RN 11/25/2017, 10:07 AM

## 2017-11-25 NOTE — Progress Notes (Signed)
ANTICOAGULATION CONSULT NOTE - Initial Consult  Pharmacy Consult for heparin Indication: atrial fibrillation  No Known Allergies  Patient Measurements: Height: 5\' 8"  (172.7 cm) Weight: 156 lb 12 oz (71.1 kg) IBW/kg (Calculated) : 68.4 Heparin Dosing Weight: 71.1 kg  Vital Signs: Temp: 98 F (36.7 C) (01/26 0751) Temp Source: Oral (01/26 0751) BP: 154/93 (01/26 0753) Pulse Rate: 101 (01/26 0751)  Labs: Recent Labs    11/23/17 0728 11/24/17 0230 11/25/17 0617  HGB  --  15.2 15.5  HCT  --  44.2 45.9  PLT  --  180 194  CREATININE 1.42* 1.30* 1.24    Estimated Creatinine Clearance: 51.3 mL/min (by C-G formula based on SCr of 1.24 mg/dL).   Medical History: Past Medical History:  Diagnosis Date  . Arthritis    "finger on right hand" (11/23/2017)  . Broken arm ~ 1952   "no OR; just reset it; not sure which side"  . CHF (congestive heart failure) ([place])    a. 12/2015: echo showing reduced EF of 35-40% b. NST: 09/2017: scar with peri-infarct ischemia, intermediate-risk study  . COPD (chronic obstructive pulmonary disease) ([place])    "was on RX when he had CHF; not on anything now" (11/23/2017)  . High cholesterol   . HOH (hard of hearing)   . Hypertension   . Oxygen deficiency   . Persistent atrial fibrillation ([place])    a. s/p DCCV in 03/2016  . Urinary frequency     Medications:  Scheduled:  . aspirin EC  81 mg Oral Daily  . atorvastatin  40 mg Oral QPM  . clopidogrel  75 mg Oral Daily  . ezetimibe  10 mg Oral Daily  . losartan  50 mg Oral Daily  . metoprolol succinate  25 mg Oral Daily  . sodium chloride flush  3 mL Intravenous Q12H  . sodium chloride flush  3 mL Intravenous Q12H    Assessment: 72 yo M s/p PCI on 1/25. On Eliquis PTA for afib. Last dose of Eliquis 1/22. Starting on heparin.  Goal of Therapy:  Heparin level 0.3-0.7 units/ml Monitor platelets by anticoagulation protocol: Yes   Plan:  Give 4000 units bolus x 1 Start heparin infusion at  1000 units/hr  8 hr HL Daily HL and CBC  Cleva Camero A Destin Vinsant  [phone] 11/25/2017,8:21 AM

## 2017-11-25 NOTE — Progress Notes (Signed)
ANTICOAGULATION CONSULT NOTE  Pharmacy Consult for heparin Indication: atrial fibrillation  No Known Allergies  Patient Measurements: Height: 5\' 8"  (172.7 cm) Weight: 156 lb 12 oz (71.1 kg) IBW/kg (Calculated) : 68.4 Heparin Dosing Weight: 71.1 kg  Vital Signs: Temp: 98.2 F (36.8 C) (01/26 1304) Temp Source: Oral (01/26 1304) BP: 146/97 (01/26 1304) Pulse Rate: 82 (01/26 1151)  Labs: Recent Labs    11/23/17 0728 11/24/17 0230 11/25/17 0617 11/25/17 1817  HGB  --  15.2 15.5  --   HCT  --  44.2 45.9  --   PLT  --  180 194  --   HEPARINUNFRC  --   --   --  0.16*  CREATININE 1.42* 1.30* 1.24  --     Estimated Creatinine Clearance: 51.3 mL/min (by C-G formula based on SCr of 1.24 mg/dL).  Assessment: 72 yo M s/p PCI on 1/25. On Eliquis PTA for afib. Last dose of Eliquis 1/22 and now started on IV heparin. Initial heparin level is subtherapeutic. No bleeding or other issues noted.   Goal of Therapy:  Heparin level 0.3-0.7 units/ml Monitor platelets by anticoagulation protocol: Yes   Plan:  Heparin bolus 2000 units IV x 1 Increase heparin gtt to 1200 units/hr Check an 8 hr heparin level Daily heparin level and CBC  Salome Arnt, PharmD, BCPS 11/25/2017 7:21 PM

## 2017-11-26 ENCOUNTER — Encounter (HOSPITAL_COMMUNITY): Payer: Self-pay | Admitting: Cardiovascular Disease

## 2017-11-26 DIAGNOSIS — I130 Hypertensive heart and chronic kidney disease with heart failure and stage 1 through stage 4 chronic kidney disease, or unspecified chronic kidney disease: Secondary | ICD-10-CM | POA: Diagnosis not present

## 2017-11-26 DIAGNOSIS — Z79899 Other long term (current) drug therapy: Secondary | ICD-10-CM | POA: Diagnosis not present

## 2017-11-26 DIAGNOSIS — H9193 Unspecified hearing loss, bilateral: Secondary | ICD-10-CM | POA: Diagnosis present

## 2017-11-26 DIAGNOSIS — E785 Hyperlipidemia, unspecified: Secondary | ICD-10-CM | POA: Diagnosis present

## 2017-11-26 DIAGNOSIS — I2582 Chronic total occlusion of coronary artery: Secondary | ICD-10-CM | POA: Diagnosis not present

## 2017-11-26 DIAGNOSIS — Z950 Presence of cardiac pacemaker: Secondary | ICD-10-CM | POA: Diagnosis not present

## 2017-11-26 DIAGNOSIS — J449 Chronic obstructive pulmonary disease, unspecified: Secondary | ICD-10-CM | POA: Diagnosis present

## 2017-11-26 DIAGNOSIS — R0602 Shortness of breath: Secondary | ICD-10-CM | POA: Diagnosis present

## 2017-11-26 DIAGNOSIS — I495 Sick sinus syndrome: Secondary | ICD-10-CM | POA: Diagnosis not present

## 2017-11-26 DIAGNOSIS — I255 Ischemic cardiomyopathy: Secondary | ICD-10-CM | POA: Diagnosis not present

## 2017-11-26 DIAGNOSIS — N183 Chronic kidney disease, stage 3 (moderate): Secondary | ICD-10-CM | POA: Diagnosis not present

## 2017-11-26 DIAGNOSIS — I4891 Unspecified atrial fibrillation: Secondary | ICD-10-CM

## 2017-11-26 DIAGNOSIS — I25110 Atherosclerotic heart disease of native coronary artery with unstable angina pectoris: Secondary | ICD-10-CM | POA: Diagnosis not present

## 2017-11-26 DIAGNOSIS — Z7901 Long term (current) use of anticoagulants: Secondary | ICD-10-CM | POA: Diagnosis not present

## 2017-11-26 DIAGNOSIS — I5042 Chronic combined systolic (congestive) and diastolic (congestive) heart failure: Secondary | ICD-10-CM | POA: Diagnosis not present

## 2017-11-26 DIAGNOSIS — Z87891 Personal history of nicotine dependence: Secondary | ICD-10-CM | POA: Diagnosis not present

## 2017-11-26 DIAGNOSIS — E7800 Pure hypercholesterolemia, unspecified: Secondary | ICD-10-CM | POA: Diagnosis present

## 2017-11-26 DIAGNOSIS — I442 Atrioventricular block, complete: Secondary | ICD-10-CM | POA: Diagnosis not present

## 2017-11-26 DIAGNOSIS — I481 Persistent atrial fibrillation: Secondary | ICD-10-CM | POA: Diagnosis not present

## 2017-11-26 DIAGNOSIS — M19041 Primary osteoarthritis, right hand: Secondary | ICD-10-CM | POA: Diagnosis present

## 2017-11-26 DIAGNOSIS — Z8249 Family history of ischemic heart disease and other diseases of the circulatory system: Secondary | ICD-10-CM | POA: Diagnosis not present

## 2017-11-26 DIAGNOSIS — I482 Chronic atrial fibrillation: Secondary | ICD-10-CM | POA: Diagnosis not present

## 2017-11-26 LAB — CBC
HEMATOCRIT: 43.9 % (ref 39.0–52.0)
Hemoglobin: 15.1 g/dL (ref 13.0–17.0)
MCH: 32.5 pg (ref 26.0–34.0)
MCHC: 34.4 g/dL (ref 30.0–36.0)
MCV: 94.4 fL (ref 78.0–100.0)
PLATELETS: 199 10*3/uL (ref 150–400)
RBC: 4.65 MIL/uL (ref 4.22–5.81)
RDW: 13.1 % (ref 11.5–15.5)
WBC: 8.2 10*3/uL (ref 4.0–10.5)

## 2017-11-26 LAB — BASIC METABOLIC PANEL
Anion gap: 7 (ref 5–15)
BUN: 15 mg/dL (ref 6–20)
CHLORIDE: 108 mmol/L (ref 101–111)
CO2: 22 mmol/L (ref 22–32)
CREATININE: 1.36 mg/dL — AB (ref 0.61–1.24)
Calcium: 8.8 mg/dL — ABNORMAL LOW (ref 8.9–10.3)
GFR calc Af Amer: 58 mL/min — ABNORMAL LOW (ref >60)
GFR calc non Af Amer: 50 mL/min — ABNORMAL LOW (ref >60)
Glucose, Bld: 107 mg/dL — ABNORMAL HIGH (ref 65–99)
Potassium: 4.4 mmol/L (ref 3.5–5.1)
Sodium: 137 mmol/L (ref 135–145)

## 2017-11-26 LAB — HEPARIN LEVEL (UNFRACTIONATED)
Heparin Unfractionated: 0.46 IU/mL (ref 0.30–0.70)
Heparin Unfractionated: 0.4 IU/mL (ref 0.30–0.70)

## 2017-11-26 NOTE — Progress Notes (Signed)
ANTICOAGULATION CONSULT NOTE  Pharmacy Consult for heparin Indication: atrial fibrillation  No Known Allergies  Patient Measurements: Height: 5\' 8"  (172.7 cm) Weight: 156 lb 12 oz (71.1 kg) IBW/kg (Calculated) : 68.4 Heparin Dosing Weight: 71.1 kg  Vital Signs: Temp: 98.3 F (36.8 C) (01/26 2142) Temp Source: Oral (01/26 2142) BP: 163/100 (01/26 2142) Pulse Rate: 86 (01/26 2142)  Labs: Recent Labs    11/23/17 0728  11/24/17 0230 11/25/17 0617 11/25/17 1817 11/26/17 0424  HGB  --    < > 15.2 15.5  --  15.1  HCT  --   --  44.2 45.9  --  43.9  PLT  --   --  180 194  --  199  HEPARINUNFRC  --   --   --   --  0.16* 0.46  CREATININE 1.42*  --  1.30* 1.24  --   --    < > = values in this interval not displayed.    Estimated Creatinine Clearance: 51.3 mL/min (by C-G formula based on SCr of 1.24 mg/dL).  Assessment: 72 yo M s/p PCI on 1/25. On Eliquis PTA for afib. Last dose of Eliquis 1/22 and now started on IV heparin. No bleeding or other issues noted. Heparin level is therapeutic.  Goal of Therapy:  Heparin level 0.3-0.7 units/ml Monitor platelets by anticoagulation protocol: Yes   Plan:  -Continue heparin at 1200 units/hr -Daily HL, CBC -Check confirmatory level in 8 hours   Harvel Quale 11/26/2017 5:28 AM

## 2017-11-26 NOTE — Consult Note (Signed)
Cardiology Consultation:   Patient ID: BRECCAN GALANT; [id number]; Mar 09, 1946   Admit date: 11/23/2017 Date of Consult: 11/26/2017  Primary Care Provider: [name], MD Primary Cardiologist:  Dr. Stanford Breed Primary Electrophysiologist: None   Patient Profile:   Ricardo Garrett is a 72 y.o. 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 Stanford Breed.  History of Present Illness:   Ricardo Garrett was admitted to the hospital with an acute coronary syndrome.  He was found to have severe coronary artery disease with a chronic total occlusion.  He underwent atherectomy.  The patient has had atrial fibrillation with a rapid ventricular response.  He is on triple therapy including aspirin, Plavix, and Eliquis.  He has long-standing atrial fibrillation with a slow ventricular response.  Now he has had rapid atrial fibrillation as well as pauses of 3 seconds.  He has not had syncope.  He denies anginal symptoms presently.  Past Medical History:  Diagnosis Date  . Arthritis    "finger on right hand" (11/23/2017)  . Broken arm ~ 1952   "no OR; just reset it; not sure which side"  . CHF (congestive heart failure) ([place])    a. 12/2015: echo showing reduced EF of 35-40% b. NST: 09/2017: scar with peri-infarct ischemia, intermediate-risk study  . COPD (chronic obstructive pulmonary disease) ([place])    "was on RX when he had CHF; not on anything now" (11/23/2017)  . High cholesterol   . HOH (hard of hearing)   . Hypertension   . Oxygen deficiency   . Persistent atrial fibrillation ([place])    a. s/p DCCV in 03/2016  . Urinary frequency     Past Surgical History:  Procedure Laterality Date  . CARDIAC CATHETERIZATION  11/23/2017  . CARDIOVERSION N/A 04/05/2016   Procedure: CARDIOVERSION;  Surgeon: Josue Hector, MD;  Location: [facility];  Service: Cardiovascular;  Laterality: N/A;  . COMPRESSION HIP SCREW Right  03/15/2013   Procedure: COMPRESSION HIP;  Surgeon: Sanjuana Kava, MD;  Location: [facility] ORS;  Service: Orthopedics;  Laterality: Right;  . FRACTURE SURGERY    . RIGHT/LEFT HEART CATH AND CORONARY ANGIOGRAPHY N/A 11/23/2017   Procedure: RIGHT/LEFT HEART CATH AND CORONARY ANGIOGRAPHY;  Surgeon: Burnell Blanks, MD;  Location: [facility];  Service: Cardiovascular;  Laterality: N/A;  . SKIN GRAFT Left    from right thigh  . TONSILLECTOMY       Home Medications:  Prior to Admission medications   Medication Sig Start Date End Date Taking? Authorizing Provider  atorvastatin (LIPITOR) 40 MG tablet Take 40 mg by mouth every evening.  08/13/17  Yes [provider]  digoxin (LANOXIN) 0.125 MG tablet TAKE 1 TABLET BY MOUTH ONCE DAILY Patient taking differently: TAKE 0.125 MG BY MOUTH ONCE DAILY 11/14/17  Yes Josue Hector, MD  ELIQUIS 5 MG TABS tablet TAKE ONE TABLET BY MOUTH TWICE DAILY Patient taking differently: TAKE 5 MG BY MOUTH TWICE DAILY 06/29/17  Yes Lendon Colonel, NP  ezetimibe (ZETIA) 10 MG tablet Take 10 mg by mouth daily.  08/15/17  Yes [provider]  furosemide (LASIX) 20 MG tablet Take 40 mg by mouth daily.   Yes [provider]  KLOR-CON M10 10 MEQ tablet TAKE ONE TABLET BY MOUTH ONCE DAILY Patient taking differently: TAKE 10 MEQ BY MOUTH ONCE DAILY 03/14/17  Yes Josue Hector, MD  losartan (COZAAR)  50 MG tablet Take 50 mg by mouth daily.  04/27/16  Yes [provider]  metoprolol succinate (TOPROL-XL) 25 MG 24 hr tablet TAKE ONE-HALF TABLET BY MOUTH IN THE MORNING AND ONE TABLET IN THE EVENING Patient taking differently: TAKE 12.5 MG TABLET BY MOUTH IN THE MORNING AND 25 MG IN THE EVENING 06/05/17  Yes Josue Hector, MD    Inpatient Medications: Scheduled Meds: . aspirin EC  81 mg Oral Daily  . atorvastatin  40 mg Oral QPM  . clopidogrel  75 mg Oral Daily  . ezetimibe  10 mg Oral Daily  . losartan  50 mg Oral Daily  .  metoprolol succinate  25 mg Oral Daily  . sodium chloride flush  3 mL Intravenous Q12H  . sodium chloride flush  3 mL Intravenous Q12H   Continuous Infusions: . sodium chloride    . sodium chloride    . heparin 1,200 Units/hr (11/26/17 0610)   PRN Meds: sodium chloride, sodium chloride, acetaminophen, hydrALAZINE, ondansetron (ZOFRAN) IV, sodium chloride, sodium chloride flush, sodium chloride flush  Allergies:   No Known Allergies  Social History:   Social History   Socioeconomic History  . Marital status: Married    Spouse name: Not on file  . Number of children: Not on file  . Years of education: Not on file  . Highest education level: Not on file  Social Needs  . Financial resource strain: Not on file  . Food insecurity - worry: Not on file  . Food insecurity - inability: Not on file  . Transportation needs - medical: Not on file  . Transportation needs - non-medical: Not on file  Occupational History  . Not on file  Tobacco Use  . Smoking status: Former Smoker    Packs/day: 1.00    Years: 20.00    Pack years: 20.00    Types: Cigarettes    Last attempt to quit: 11/01/1979    Years since quitting: 38.0  . Smokeless tobacco: Never Used  Substance and Sexual Activity  . Alcohol use: Yes    Comment: 11/23/2017 "beer once/month maybe"  . Drug use: Yes    Types: Marijuana    Comment: 11/23/2017 "nothing for years"  . Sexual activity: Yes    Birth control/protection: None  Other Topics Concern  . Not on file  Social History Narrative  . Not on file    Family History:    Family History  Problem Relation Age of Onset  . Diabetes Mother   . Heart attack Father      ROS:  Please see the history of present illness.   All other ROS reviewed and negative.     Physical Exam/Data:   Vitals:   11/25/17 1220 11/25/17 1304 11/25/17 2142 11/26/17 0541  BP: (!) 163/89 (!) 146/97 (!) 163/100 (!) 151/92  Pulse:   86 87  Resp: 14  16 16   Temp:  98.2 F (36.8 C) 98.3  F (36.8 C) 98.5 F (36.9 C)  TempSrc:  Oral Oral Oral  SpO2:  95% 98% 98%  Weight:    151 lb 6.4 oz (68.7 kg)  Height:        Intake/Output Summary (Last 24 hours) at 11/26/2017 0824 Last data filed at 11/26/2017 0100 Gross per 24 hour  Intake -  Output 1025 ml  Net -1025 ml   Filed Weights   11/23/17 0712 11/25/17 0328 11/26/17 0541  Weight: 152 lb (68.9 kg) 156 lb 12 oz (71.1 kg)  151 lb 6.4 oz (68.7 kg)   Body mass index is 23.02 kg/m.  General:  Well nourished, well developed, in no acute distress HEENT: normal Lymph: no adenopathy Neck: no JVD Endocrine:  No thryomegaly Vascular: No carotid bruits; FA pulses 2+ bilaterally without bruits  Cardiac:  normal S1, S2; IRIRR; no murmur  Lungs:  clear to auscultation bilaterally, no wheezing, rhonchi or rales  Abd: soft, nontender, no hepatomegaly  Ext: no edema Musculoskeletal:  No deformities, BUE and BLE strength normal and equal Skin: warm and dry  Neuro:  CNs 2-12 intact, no focal abnormalities noted Psych:  Normal affect   EKG: Atrial fibrillation with a rapid ventricular response Telemetry:  Telemetry was personally reviewed and demonstrates: Atrial fibrillation with a rapid ventricular response, and at times a slow ventricular response  Relevant CV Studies: Cardiac catheterization notes reviewed  Laboratory Data:  Chemistry Recent Labs  Lab 11/24/17 0230 11/25/17 0617 11/26/17 0424  NA 138 137 137  K 4.4 4.7 4.4  CL 108 107 108  CO2 22 22 22   GLUCOSE 104* 116* 107*  BUN 20 17 15   CREATININE 1.30* 1.24 1.36*  CALCIUM 8.5* 8.9 8.8*  GFRNONAA 53* 56* 50*  GFRAA >60 >60 58*  ANIONGAP 8 8 7     No results for input(s): PROT, ALBUMIN, AST, ALT, ALKPHOS, BILITOT in the last 168 hours. Hematology Recent Labs  Lab 11/24/17 0230 11/25/17 0617 11/26/17 0424  WBC 7.3 8.9 8.2  RBC 4.65 4.82 4.65  HGB 15.2 15.5 15.1  HCT 44.2 45.9 43.9  MCV 95.1 95.2 94.4  MCH 32.7 32.2 32.5  MCHC 34.4 33.8 34.4  RDW  13.3 13.1 13.1  PLT 180 194 199   Cardiac EnzymesNo results for input(s): TROPONINI in the last 168 hours. No results for input(s): TROPIPOC in the last 168 hours.  BNPNo results for input(s): BNP, PROBNP in the last 168 hours.  DDimer No results for input(s): DDIMER in the last 168 hours.  Radiology/Studies:  No results found.  Assessment and Plan:   1.  Atrial fibrillation with a rapid ventricular response -I have reviewed the telemetry strips.  His ventricular rate is very difficult to control as he has both tachycardia and bradycardia with pauses of 3 seconds.  Permanent pacemaker insertion is indicated, but would be quite difficult and with increased risk in the setting of triple anticoagulation therapy. 2.  Coronary artery disease -he is status post atherectomy.  I will discussed the possibility of holding any of his anticoagulation prior to pacemaker insertion.  It may be that we have to hold his oral anticoagulant but let him continue on aspirin and Plavix with the high risk of bleeding still present. 3.  Chronic renal insufficiency, stage III - his diuretic therapy has been held.  He currently appears fairly euvolemic.   For questions or updates, please contact [place] Please consult [email or web address] for contact info under Cardiology/STEMI.   Signed, [name], MD  11/26/2017 8:24 AM

## 2017-11-27 ENCOUNTER — Encounter (HOSPITAL_COMMUNITY)
Admission: RE | Disposition: A | Discharge status: Home / Self Care | Payer: Self-pay | Source: Ambulatory Visit | Attending: Cardiovascular Disease

## 2017-11-27 ENCOUNTER — Encounter (HOSPITAL_COMMUNITY): Payer: Self-pay | Admitting: Nurse Practitioner

## 2017-11-27 DIAGNOSIS — I481 Persistent atrial fibrillation: Secondary | ICD-10-CM

## 2017-11-27 DIAGNOSIS — I442 Atrioventricular block, complete: Secondary | ICD-10-CM

## 2017-11-27 HISTORY — PX: PACEMAKER IMPLANT: EP1218

## 2017-11-27 LAB — SURGICAL PCR SCREEN
MRSA, PCR: NEGATIVE
STAPHYLOCOCCUS AUREUS: NEGATIVE

## 2017-11-27 LAB — HEPARIN LEVEL (UNFRACTIONATED): Heparin Unfractionated: 0.46 IU/mL (ref 0.30–0.70)

## 2017-11-27 SURGERY — PACEMAKER IMPLANT

## 2017-11-27 MED ORDER — FENTANYL CITRATE (PF) 100 MCG/2ML IJ SOLN
INTRAMUSCULAR | Status: DC | PRN
  Administered 2017-11-27: 12.5 ug via INTRAVENOUS

## 2017-11-27 MED ORDER — FENTANYL CITRATE (PF) 100 MCG/2ML IJ SOLN
INTRAMUSCULAR | Status: AC
  Filled 2017-11-27: qty 2

## 2017-11-27 MED ORDER — METOPROLOL TARTRATE 5 MG/5ML IV SOLN
INTRAVENOUS | Status: DC | PRN
  Administered 2017-11-27: 5 mg via INTRAVENOUS

## 2017-11-27 MED ORDER — SODIUM CHLORIDE 0.9 % IV SOLN: INTRAVENOUS | Status: DC

## 2017-11-27 MED ORDER — MIDAZOLAM HCL 5 MG/5ML IJ SOLN
INTRAMUSCULAR | Status: AC
  Filled 2017-11-27: qty 5

## 2017-11-27 MED ORDER — HEPARIN (PORCINE) IN NACL 2-0.9 UNIT/ML-% IJ SOLN
INTRAMUSCULAR | Status: AC | PRN
  Administered 2017-11-27: 500 mL

## 2017-11-27 MED ORDER — IOPAMIDOL (ISOVUE-370) INJECTION 76%
INTRAVENOUS | Status: DC | PRN
Start: 2017-11-27 — End: 2017-11-27
  Administered 2017-11-27: 10 mL via INTRAVENOUS

## 2017-11-27 MED ORDER — SODIUM CHLORIDE 0.9 % IR SOLN
Status: AC
  Filled 2017-11-27: qty 2

## 2017-11-27 MED ORDER — CHLORHEXIDINE GLUCONATE 4 % EX LIQD
60.0000 mL | Freq: Once | CUTANEOUS | Status: DC
  Administered 2017-11-27: 4 via TOPICAL
  Filled 2017-11-27: qty 60

## 2017-11-27 MED ORDER — LIDOCAINE HCL (PF) 1 % IJ SOLN
INTRAMUSCULAR | Status: AC
  Filled 2017-11-27: qty 60

## 2017-11-27 MED ORDER — MIDAZOLAM HCL 5 MG/5ML IJ SOLN
INTRAMUSCULAR | Status: DC | PRN
  Administered 2017-11-27: 1 mg via INTRAVENOUS

## 2017-11-27 MED ORDER — CEFAZOLIN SODIUM-DEXTROSE 2-4 GM/100ML-% IV SOLN
2.0000 g | INTRAVENOUS | Status: AC
  Administered 2017-11-27: 2 g via INTRAVENOUS

## 2017-11-27 MED ORDER — IOPAMIDOL (ISOVUE-370) INJECTION 76%
INTRAVENOUS | Status: AC
  Filled 2017-11-27: qty 50

## 2017-11-27 MED ORDER — CEFAZOLIN SODIUM-DEXTROSE 2-4 GM/100ML-% IV SOLN
INTRAVENOUS | Status: AC
  Filled 2017-11-27: qty 100

## 2017-11-27 MED ORDER — LIDOCAINE HCL (PF) 1 % IJ SOLN
INTRAMUSCULAR | Status: DC | PRN
  Administered 2017-11-27: 60 mL

## 2017-11-27 MED ORDER — SODIUM CHLORIDE 0.9 % IR SOLN
80.0000 mg | Status: AC
  Administered 2017-11-27: 80 mg

## 2017-11-27 MED ORDER — METOPROLOL TARTRATE 5 MG/5ML IV SOLN
INTRAVENOUS | Status: AC
  Filled 2017-11-27: qty 5

## 2017-11-27 MED FILL — Lidocaine HCl Local Inj 1%: INTRAMUSCULAR | Qty: 20 | Status: AC

## 2017-11-27 SURGICAL SUPPLY — 7 items
CABLE SURGICAL S-101-97-12 (CABLE) ×2 IMPLANT
LEAD TENDRIL MRI 52CM LPA1200M (Lead) ×2 IMPLANT
LEAD TENDRIL MRI 58CM LPA1200M (Lead) ×2 IMPLANT
PACEMAKER ASSURITY DR-RF (Pacemaker) ×2 IMPLANT
PAD DEFIB LIFELINK (PAD) ×2 IMPLANT
SHEATH CLASSIC 8F (SHEATH) ×4 IMPLANT
TRAY PACEMAKER INSERTION (PACKS) ×2 IMPLANT

## 2017-11-27 NOTE — Progress Notes (Signed)
Electrophysiology Rounding Note  Patient Name: Ricardo Garrett Date of Encounter: 11/27/2017  Primary Cardiologist: Johnsie Cancel Electrophysiologist: Lovena Le   Subjective   The patient wants to go home.  He denies chest pain or shortness of breath. No dizzy spells.  Inpatient Medications    Scheduled Meds: . aspirin EC  81 mg Oral Daily  . atorvastatin  40 mg Oral QPM  . clopidogrel  75 mg Oral Daily  . ezetimibe  10 mg Oral Daily  . losartan  50 mg Oral Daily  . metoprolol succinate  25 mg Oral Daily  . sodium chloride flush  3 mL Intravenous Q12H  . sodium chloride flush  3 mL Intravenous Q12H   Continuous Infusions: . sodium chloride    . sodium chloride    . heparin 1,200 Units/hr (11/26/17 2132)   PRN Meds: sodium chloride, sodium chloride, acetaminophen, hydrALAZINE, ondansetron (ZOFRAN) IV, sodium chloride, sodium chloride flush, sodium chloride flush   Vital Signs    Vitals:   11/26/17 2130 11/27/17 0000 11/27/17 0500 11/27/17 0806  BP: 122/69  124/80 (!) 142/103  Pulse: 71  93 88  Resp: 14  18   Temp: 99 F (37.2 C)  98.5 F (36.9 C)   TempSrc: Oral  Oral   SpO2: 96%  96%   Weight:   149 lb 9.6 oz (67.9 kg)   Height:  5\' 8"  (1.727 m)      Intake/Output Summary (Last 24 hours) at 11/27/2017 2229 Last data filed at 11/27/2017 0537 Gross per 24 hour  Intake 780 ml  Output 1075 ml  Net -295 ml   Filed Weights   11/25/17 0328 11/26/17 0541 11/27/17 0500  Weight: 156 lb 12 oz (71.1 kg) 151 lb 6.4 oz (68.7 kg) 149 lb 9.6 oz (67.9 kg)    Physical Exam    GEN- The patient is elderly appearing, alert and oriented x 3 today, +HOH Head- normocephalic, atraumatic Eyes-  Sclera clear, conjunctiva pink Ears- hearing intact Oropharynx- clear Neck- supple Lungs- Clear to ausculation bilaterally, normal work of breathing Heart- Tachycardic irregular rate and rhythm  GI- soft, NT, ND, + BS Extremities- no clubbing, cyanosis, or edema Skin- no rash or  lesion Psych- euthymic mood, full affect Neuro- strength and sensation are intact  Labs    CBC Recent Labs    11/25/17 0617 11/26/17 0424  WBC 8.9 8.2  HGB 15.5 15.1  HCT 45.9 43.9  MCV 95.2 94.4  PLT 194 798   Basic Metabolic Panel Recent Labs    11/25/17 0617 11/26/17 0424  NA 137 137  K 4.7 4.4  CL 107 108  CO2 22 22  GLUCOSE 116* 107*  BUN 17 15  CREATININE 1.24 1.36*  CALCIUM 8.9 8.8*     Telemetry    AF, V rates 90-120's, pauses up to 3 seconds (personally reviewed)  Radiology    No results found.   Assessment & Plan    1.  Tachy/brady syndrome Will likely require pacemaker placement This is complicated by recent stent and need for triple therapy for at least 30 days Discussed with Dr Angelena Form today - he would not hold Plavix or ASA with recent arthrectomy and stent Will discuss with Dr Lovena Le  2.  CAD S/p PCI Continue ASA/Plavix for 30 days then stop ASA  No ischemic symptoms  3.  HTN Trend stable  4.  Persistent atrial fibrillation Continue Eliquis long term for CHADS2VASC of 3 May need to hold for a short  time around [facility] implant  Dr Lovena Le to see later today   Signed, Chanetta Marshall, NP  11/27/2017, 8:21 AM   EP Attending  Patient seen and examined. Agree with above. He has continued to have atrial fib with RVR with HR's in the 150's and pauses approaching 3 seconds. I am concerned about bleeding with his recent stent but at this point, he will do poorly if her remains in atrial  Fib with a RVR and a slow VR. He will undergo PPM insertion followed by re-initiation of [place] in the next several days but not before.   [name]. MD

## 2017-11-27 NOTE — H&P (View-Only) (Signed)
Electrophysiology Rounding Note  Patient Name: Ricardo Garrett Date of Encounter: 11/27/2017  Primary Cardiologist: Johnsie Cancel Electrophysiologist: Lovena Le   Subjective   The patient wants to go home.  He denies chest pain or shortness of breath. No dizzy spells.  Inpatient Medications    Scheduled Meds: . aspirin EC  81 mg Oral Daily  . atorvastatin  40 mg Oral QPM  . clopidogrel  75 mg Oral Daily  . ezetimibe  10 mg Oral Daily  . losartan  50 mg Oral Daily  . metoprolol succinate  25 mg Oral Daily  . sodium chloride flush  3 mL Intravenous Q12H  . sodium chloride flush  3 mL Intravenous Q12H   Continuous Infusions: . sodium chloride    . sodium chloride    . heparin 1,200 Units/hr (11/26/17 2132)   PRN Meds: sodium chloride, sodium chloride, acetaminophen, hydrALAZINE, ondansetron (ZOFRAN) IV, sodium chloride, sodium chloride flush, sodium chloride flush   Vital Signs    Vitals:   11/26/17 2130 11/27/17 0000 11/27/17 0500 11/27/17 0806  BP: 122/69  124/80 (!) 142/103  Pulse: 71  93 88  Resp: 14  18   Temp: 99 F (37.2 C)  98.5 F (36.9 C)   TempSrc: Oral  Oral   SpO2: 96%  96%   Weight:   149 lb 9.6 oz (67.9 kg)   Height:  5\' 8"  (1.727 m)      Intake/Output Summary (Last 24 hours) at 11/27/2017 1607 Last data filed at 11/27/2017 0537 Gross per 24 hour  Intake 780 ml  Output 1075 ml  Net -295 ml   Filed Weights   11/25/17 0328 11/26/17 0541 11/27/17 0500  Weight: 156 lb 12 oz (71.1 kg) 151 lb 6.4 oz (68.7 kg) 149 lb 9.6 oz (67.9 kg)    Physical Exam    GEN- The patient is elderly appearing, alert and oriented x 3 today, +HOH Head- normocephalic, atraumatic Eyes-  Sclera clear, conjunctiva pink Ears- hearing intact Oropharynx- clear Neck- supple Lungs- Clear to ausculation bilaterally, normal work of breathing Heart- Tachycardic irregular rate and rhythm  GI- soft, NT, ND, + BS Extremities- no clubbing, cyanosis, or edema Skin- no rash or  lesion Psych- euthymic mood, full affect Neuro- strength and sensation are intact  Labs    CBC Recent Labs    11/25/17 0617 11/26/17 0424  WBC 8.9 8.2  HGB 15.5 15.1  HCT 45.9 43.9  MCV 95.2 94.4  PLT 194 371   Basic Metabolic Panel Recent Labs    11/25/17 0617 11/26/17 0424  NA 137 137  K 4.7 4.4  CL 107 108  CO2 22 22  GLUCOSE 116* 107*  BUN 17 15  CREATININE 1.24 1.36*  CALCIUM 8.9 8.8*     Telemetry    AF, V rates 90-120's, pauses up to 3 seconds (personally reviewed)  Radiology    No results found.   Assessment & Plan    1.  Tachy/brady syndrome Will likely require pacemaker placement This is complicated by recent stent and need for triple therapy for at least 30 days Discussed with Dr Angelena Form today - he would not hold Plavix or ASA with recent arthrectomy and stent Will discuss with Dr Lovena Le  2.  CAD S/p PCI Continue ASA/Plavix for 30 days then stop ASA  No ischemic symptoms  3.  HTN Trend stable  4.  Persistent atrial fibrillation Continue Eliquis long term for CHADS2VASC of 3 May need to hold for a short  time around [facility] implant  Dr Lovena Le to see later today   Signed, Chanetta Marshall, NP  11/27/2017, 8:21 AM   EP Attending  Patient seen and examined. Agree with above. He has continued to have atrial fib with RVR with HR's in the 150's and pauses approaching 3 seconds. I am concerned about bleeding with his recent stent but at this point, he will do poorly if her remains in atrial  Fib with a RVR and a slow VR. He will undergo PPM insertion followed by re-initiation of [place] in the next several days but not before.   [name]. MD

## 2017-11-27 NOTE — Interval H&P Note (Signed)
History and Physical Interval Note:  11/27/2017 2:18 PM  Ricardo Garrett  has presented today for surgery, with the diagnosis of tachibradie  The various methods of treatment have been discussed with the patient and family. After consideration of risks, benefits and other options for treatment, the patient has consented to  Procedure(s): PACEMAKER IMPLANT (N/A) as a surgical intervention .  The patient's history has been reviewed, patient examined, no change in status, stable for surgery.  I have reviewed the patient's chart and labs.  Questions were answered to the patient's satisfaction.     [name]

## 2017-11-27 NOTE — Progress Notes (Signed)
Pt ambulated as per instruction. Pt HR went from 86 to 152 as pt was ambulating. Amber NP with EP updated via text.

## 2017-11-28 ENCOUNTER — Inpatient Hospital Stay (HOSPITAL_COMMUNITY): Payer: Medicare HMO

## 2017-11-28 ENCOUNTER — Encounter (HOSPITAL_COMMUNITY): Payer: Self-pay | Admitting: Internal Medicine

## 2017-11-28 DIAGNOSIS — I442 Atrioventricular block, complete: Secondary | ICD-10-CM

## 2017-11-28 DIAGNOSIS — I495 Sick sinus syndrome: Secondary | ICD-10-CM

## 2017-11-28 MED ORDER — METOPROLOL SUCCINATE ER 50 MG PO TB24
50.0000 mg | ORAL_TABLET | Freq: Every day | ORAL | Status: DC
  Administered 2017-11-28: 50 mg via ORAL
  Filled 2017-11-28: qty 1

## 2017-11-28 MED ORDER — ASPIRIN 81 MG PO TBEC: 81.0000 mg | DELAYED_RELEASE_TABLET | Freq: Every day | ORAL | Status: DC

## 2017-11-28 MED ORDER — APIXABAN 5 MG PO TABS: 5.0000 mg | ORAL_TABLET | Freq: Two times a day (BID) | ORAL | 6 refills | Status: DC

## 2017-11-28 MED ORDER — METOPROLOL SUCCINATE ER 50 MG PO TB24: 50.0000 mg | ORAL_TABLET | Freq: Two times a day (BID) | ORAL | 1 refills | Status: DC

## 2017-11-28 MED ORDER — CLOPIDOGREL BISULFATE 75 MG PO TABS
75.0000 mg | ORAL_TABLET | Freq: Every day | ORAL | 1 refills | Status: DC
Start: 2017-11-28 — End: 2018-06-04

## 2017-11-28 MED FILL — Cefazolin Sodium-Dextrose IV Solution 2 GM/100ML-4%: INTRAVENOUS | Qty: 100 | Status: AC

## 2017-11-28 MED FILL — Gentamicin Sulfate Inj 40 MG/ML: INTRAMUSCULAR | Qty: 80 | Status: AC

## 2017-11-28 NOTE — Discharge Instructions (Signed)
° ° °  Supplemental Discharge Instructions for  Pacemaker/Defibrillator Patients  Activity No heavy lifting or vigorous activity with your left/right arm for 6 to 8 weeks.  Do not raise your left/right arm above your head for one week.  Gradually raise your affected arm as drawn below.           __      12/02/17                        12/03/17                       12/04/17                      12/05/17  NO DRIVING for   1 week  ; you may begin driving on   12/01/09  .  WOUND CARE - Keep the wound area clean and dry.  Do not get this area wet for one week. No showers for one week; you may shower on   12/05/17  . - The tape/steri-strips on your wound will fall off; do not pull them off.  No bandage is needed on the site.  DO  NOT apply any creams, oils, or ointments to the wound area. - If you notice any drainage or discharge from the wound, any swelling or bruising at the site, or you develop a fever > 101? F after you are discharged home, call the office at once.  Special Instructions - You are still able to use cellular telephones; use the ear opposite the side where you have your pacemaker/defibrillator.  Avoid carrying your cellular phone near your device. - When traveling through airports, show security personnel your identification card to avoid being screened in the metal detectors.  Ask the security personnel to use the hand wand. - Avoid arc welding equipment, MRI testing (magnetic resonance imaging), TENS units (transcutaneous nerve stimulators).  Call the office for questions about other devices. - Avoid electrical appliances that are in poor condition or are not properly grounded. - Microwave ovens are safe to be near or to operate.

## 2017-11-28 NOTE — Discharge Summary (Signed)
ELECTROPHYSIOLOGY PROCEDURE DISCHARGE SUMMARY    Patient ID: Ricardo Garrett,  MRN: [id number], DOB/AGE: 1945-11-25 72 y.o.  Admit date: 11/23/2017 Discharge date: 11/28/2017  Primary Care Physician: [name], MD Primary Cardiologist: Johnsie Cancel Electrophysiologist: Lovena Le  Primary Discharge Diagnosis:  CAD s/p intervention this admission Tachy brady syndrome s/p PPM implant this admission  Secondary Discharge Diagnosis:  1.  Longstanding persistent atrial fibrillation 2.  COPD 3.  HTN  No Known Allergies   Procedures This Admission:  1.  LHC on 11/23/17 demonstrated severe double vessel CAD with cardiomyopathy and normal LV filling pressures.  Planned PCI for 11/24/17 2.  Planned PCI 11/24/17 with successful orbital arthrectomy/PTCA/DES mid circumflex, successful balloon angioplasty of small OM branch ostium. 3.  Implantation of a STJ dual chamber PPM on 11/27/17 by Dr Lovena Le. See op note for full details. There were no immediate post procedure complications. 2.  CXR on 11/28/17 pending and will be reviewed prior to discharge   Brief HPI/Hospital Course:  Ricardo Garrett is a 72 y.o. male with the above past medical history. He was admitted for elective catheterization to evaluate exertional shortness of breath and fatigue. He underwent successful intervention with details as outlined above. Post procedure, he developed AF with RVR as well as pauses up to 3 seconds.  Because of difficult to control rates, plan was for pacemaker implant for tachy brady syndrome. Risks, benefits, and alternatives to PPM implantation were reviewed with the patient who wished to proceed. The patient underwent implantation of a STJ dual chamber PPM with details as outlined above.  He  was monitored on telemetry overnight which demonstrated AF.  Left chest was without hematoma or ecchymosis.  The device was interrogated and found to be functioning normally.  CXR was obtained and demonstrated no  pneumothorax status post device implantation.  Wound care, arm mobility, and restrictions were reviewed with the patient.  The patient was examined and considered stable for discharge to home.   He will hold his Eliquis until seen in device clinic next week per Dr Lovena Le. He cannot interrupt DAPT for 30 days post intervention. He has follow up scheduled with Dr Johnsie Cancel in early March.   Metoprolol was increased at discharge for rate control. This will need to be evaluated further at wound check appt to determine need for adjunct rate control therapy.    Physical Exam: Vitals:   11/27/17 1724 11/27/17 1954 11/28/17 0007 11/28/17 0440  BP: (!) 155/104 (!) 130/95 (!) 128/91 (!) 137/95  Pulse: 60 75 72 (!) 104  Resp: 20 19 17    Temp:  (!) 97.4 F (36.3 C)  98.7 F (37.1 C)  TempSrc:  Axillary  Oral  SpO2: 100% 100% 100% 100%  Weight:    162 lb 11.2 oz (73.8 kg)  Height:        GEN- The patient is elderly appearing, alert and oriented x 3 today.   HEENT: normocephalic, atraumatic; sclera clear, conjunctiva pink; hearing intact; oropharynx clear; neck supple  Lungs- Clear to ausculation bilaterally, normal work of breathing.  No wheezes, rales, rhonchi Heart- Irregular rate and rhythm  GI- soft, non-tender, non-distended, bowel sounds present  Extremities- no clubbing, cyanosis, or edema  MS- no significant deformity or atrophy Skin- warm and dry, no rash or lesion, left chest without hematoma/ecchymosis Psych- euthymic mood, full affect Neuro- strength and sensation are intact   Labs:   Lab Results  Component Value Date   WBC 8.2 11/26/2017  HGB 15.1 11/26/2017   HCT 43.9 11/26/2017   MCV 94.4 11/26/2017   PLT 199 11/26/2017    Recent Labs  Lab 11/26/17 0424  NA 137  K 4.4  CL 108  CO2 22  BUN 15  CREATININE 1.36*  CALCIUM 8.8*  GLUCOSE 107*    Discharge Medications:  Allergies as of 11/28/2017   No Known Allergies     Medication List    TAKE these  medications   apixaban 5 MG Tabs tablet Commonly known as:  ELIQUIS Take 1 tablet (5 mg total) by mouth 2 (two) times daily. Do not restart until seen in office for wound check appointment What changed:    how much to take  additional instructions   aspirin 81 MG EC tablet Take 1 tablet (81 mg total) by mouth daily.   atorvastatin 40 MG tablet Commonly known as:  LIPITOR Take 40 mg by mouth every evening.   clopidogrel 75 MG tablet Commonly known as:  PLAVIX Take 1 tablet (75 mg total) by mouth daily.   digoxin 0.125 MG tablet Commonly known as:  LANOXIN TAKE 1 TABLET BY MOUTH ONCE DAILY What changed:    how much to take  how to take this  when to take this   ezetimibe 10 MG tablet Commonly known as:  ZETIA Take 10 mg by mouth daily.   furosemide 20 MG tablet Commonly known as:  LASIX Take 40 mg by mouth daily.   KLOR-CON M10 10 MEQ tablet Generic drug:  potassium chloride TAKE ONE TABLET BY MOUTH ONCE DAILY What changed:    how much to take  how to take this  when to take this   losartan 50 MG tablet Commonly known as:  COZAAR Take 50 mg by mouth daily.   metoprolol succinate 50 MG 24 hr tablet Commonly known as:  TOPROL-XL Take 1 tablet (50 mg total) by mouth 2 (two) times daily. What changed:    medication strength  See the new instructions.       Disposition:  Discharge Instructions    Amb Referral to Cardiac Rehabilitation   Complete by:  As directed    Diagnosis:  Coronary Stents   Diet - low sodium heart healthy   Complete by:  As directed    Increase activity slowly   Complete by:  As directed      Follow-up Information    [place] [facility] Follow up on 12/07/2017.   Specialty:  Cardiology Why:  at 3:30PM  Contact information: [address] [place]       Evans Lance, MD Follow up on 03/01/2018.   Specialty:  Cardiology Why:  at 9:15AM Contact  information: [place]  [id number] [phone]           Duration of Discharge Encounter: Greater than 30 minutes including physician time.  Signed, Chanetta Marshall, NP 11/28/2017 9:13 AM  EP Attending  Patient seen and examined. Agree with above. The patient is doing well after PPM insertion for tachy-brady and his CXR looks good and PM interogation done under my direction demonstrates normal device function. His ventricular rate is still increased as is his blood pressure. We will uptitrate his beta blocker both now and as needed as an outpatient. He will hold his oral anti-coagulant for a week or more. The combination of ASA, Plavix, and Eliquis immediately after PPM is contra-indicated.  Mikle Bosworth.D.

## 2017-11-28 NOTE — Progress Notes (Signed)
CARDIAC REHAB PHASE I   PRE:  Rate/Rhythm: 79 POD    BP: sitting 107/78    SaO2: 95 RA  MODE:  Ambulation: 430 ft   POST:  Rate/Rhythm: 130 afib    BP: sitting 128/82     SaO2:   Pt moving well, steady in hall. HR mostly 110-130s walking, no sx. He did jump up to 150-170s after he sat down but this was very brief. Ed completed/reviewed with pt and family. It is quite difficult for them to understand his medical issues. He needs considerable reiteration. Gave them [employer] the [employer] booklet and reminded them that he has diet and ex gl and CPRII information. Will refer to [place]. I reinforced need for Plavix/ASA. His RN has been educating when in the room as well. I reminded him of pacer precautions and that he can use the sling if he can't remember.  [place], ACSM 11/28/2017 11:21 AM

## 2017-11-30 ENCOUNTER — Ambulatory Visit: Payer: Self-pay

## 2017-12-02 DIAGNOSIS — Z950 Presence of cardiac pacemaker: Secondary | ICD-10-CM | POA: Diagnosis not present

## 2017-12-02 DIAGNOSIS — J069 Acute upper respiratory infection, unspecified: Secondary | ICD-10-CM | POA: Diagnosis not present

## 2017-12-02 DIAGNOSIS — J449 Chronic obstructive pulmonary disease, unspecified: Secondary | ICD-10-CM | POA: Diagnosis not present

## 2017-12-07 ENCOUNTER — Ambulatory Visit (INDEPENDENT_AMBULATORY_CARE_PROVIDER_SITE_OTHER): Payer: Medicare HMO | Admitting: *Deleted

## 2017-12-07 ENCOUNTER — Encounter: Payer: Self-pay | Admitting: *Deleted

## 2017-12-07 DIAGNOSIS — I495 Sick sinus syndrome: Secondary | ICD-10-CM

## 2017-12-07 NOTE — Patient Instructions (Addendum)
START Eliquis   Continue Current Metoprolol dosage

## 2017-12-09 LAB — CUP PACEART INCLINIC DEVICE CHECK
Battery Remaining Longevity: 116 mo
Battery Voltage: 3.05 V
Date Time Interrogation Session: 20190207204013
Implantable Lead Implant Date: 20190128
Implantable Lead Location: 753859
Implantable Pulse Generator Implant Date: 20190128
Lead Channel Impedance Value: 625 Ohm
Lead Channel Impedance Value: 662.5 Ohm
Lead Channel Pacing Threshold Amplitude: 0.75 V
Lead Channel Sensing Intrinsic Amplitude: 12 mV
Lead Channel Setting Pacing Amplitude: 3.5 V
Lead Channel Setting Sensing Sensitivity: 2 mV
MDC IDC LEAD IMPLANT DT: 20190128
MDC IDC LEAD LOCATION: 753860
MDC IDC MSMT LEADCHNL RA SENSING INTR AMPL: 2.1 mV
MDC IDC MSMT LEADCHNL RV PACING THRESHOLD PULSEWIDTH: 0.5 ms
MDC IDC PG SERIAL: 8982069
MDC IDC SET LEADCHNL RA PACING AMPLITUDE: 3.5 V
MDC IDC SET LEADCHNL RV PACING PULSEWIDTH: 0.5 ms
MDC IDC STAT BRADY RA PERCENT PACED: 0 %
MDC IDC STAT BRADY RV PERCENT PACED: 32 %

## 2017-12-09 NOTE — Progress Notes (Signed)
Wound check appointment. Steri-strips removed. Wound without redness or edema. Incision edges approximated, wound well healed.  Normal device function. Thresholds, sensing, and impedances consistent with implant measurements. Device programmed at 3.5V for extra safety margin until 3 month visit. Histogram distribution appropriate for patient and level of activity. 100% AF burden, Avg V rates well controlled-- GT assessed wound and recommended to Start Eliquis and continue current metoprolol dose. 1 high ventricular rates noted-- EGM appears AF with RVR, duration 5 minutes 17 seconds, Avg high V rate 172 bpm. Patient educated about wound care, arm mobility, lifting restrictions. ROV with GT 03/01/18

## 2017-12-18 DIAGNOSIS — H903 Sensorineural hearing loss, bilateral: Secondary | ICD-10-CM | POA: Diagnosis not present

## 2017-12-18 DIAGNOSIS — R0981 Nasal congestion: Secondary | ICD-10-CM | POA: Diagnosis not present

## 2017-12-18 DIAGNOSIS — R438 Other disturbances of smell and taste: Secondary | ICD-10-CM | POA: Diagnosis not present

## 2017-12-19 ENCOUNTER — Other Ambulatory Visit: Payer: Self-pay | Admitting: Cardiovascular Disease

## 2017-12-19 DIAGNOSIS — E875 Hyperkalemia: Secondary | ICD-10-CM

## 2017-12-19 DIAGNOSIS — Z79899 Other long term (current) drug therapy: Secondary | ICD-10-CM

## 2017-12-27 DIAGNOSIS — E782 Mixed hyperlipidemia: Secondary | ICD-10-CM | POA: Diagnosis not present

## 2017-12-27 DIAGNOSIS — R7301 Impaired fasting glucose: Secondary | ICD-10-CM | POA: Diagnosis not present

## 2017-12-30 DIAGNOSIS — E782 Mixed hyperlipidemia: Secondary | ICD-10-CM | POA: Diagnosis not present

## 2017-12-30 DIAGNOSIS — N183 Chronic kidney disease, stage 3 (moderate): Secondary | ICD-10-CM | POA: Diagnosis not present

## 2017-12-30 DIAGNOSIS — I5032 Chronic diastolic (congestive) heart failure: Secondary | ICD-10-CM | POA: Diagnosis not present

## 2017-12-30 DIAGNOSIS — R7301 Impaired fasting glucose: Secondary | ICD-10-CM | POA: Diagnosis not present

## 2018-01-04 ENCOUNTER — Ambulatory Visit (INDEPENDENT_AMBULATORY_CARE_PROVIDER_SITE_OTHER): Payer: Medicare HMO | Admitting: Otolaryngology

## 2018-01-04 DIAGNOSIS — H903 Sensorineural hearing loss, bilateral: Secondary | ICD-10-CM

## 2018-01-04 DIAGNOSIS — H9313 Tinnitus, bilateral: Secondary | ICD-10-CM

## 2018-01-13 DIAGNOSIS — E785 Hyperlipidemia, unspecified: Secondary | ICD-10-CM | POA: Diagnosis not present

## 2018-01-13 DIAGNOSIS — I2510 Atherosclerotic heart disease of native coronary artery without angina pectoris: Secondary | ICD-10-CM | POA: Diagnosis not present

## 2018-01-13 DIAGNOSIS — I739 Peripheral vascular disease, unspecified: Secondary | ICD-10-CM | POA: Diagnosis not present

## 2018-01-13 DIAGNOSIS — Z7901 Long term (current) use of anticoagulants: Secondary | ICD-10-CM | POA: Diagnosis not present

## 2018-01-13 DIAGNOSIS — Z7902 Long term (current) use of antithrombotics/antiplatelets: Secondary | ICD-10-CM | POA: Diagnosis not present

## 2018-01-13 DIAGNOSIS — I509 Heart failure, unspecified: Secondary | ICD-10-CM | POA: Diagnosis not present

## 2018-01-13 DIAGNOSIS — G8929 Other chronic pain: Secondary | ICD-10-CM | POA: Diagnosis not present

## 2018-01-13 DIAGNOSIS — I130 Hypertensive heart and chronic kidney disease with heart failure and stage 1 through stage 4 chronic kidney disease, or unspecified chronic kidney disease: Secondary | ICD-10-CM | POA: Diagnosis not present

## 2018-01-13 DIAGNOSIS — N183 Chronic kidney disease, stage 3 (moderate): Secondary | ICD-10-CM | POA: Diagnosis not present

## 2018-01-13 DIAGNOSIS — I252 Old myocardial infarction: Secondary | ICD-10-CM | POA: Diagnosis not present

## 2018-01-14 NOTE — Progress Notes (Signed)
Cardiology Office Note    Date:  01/17/2018   ID:  Ricardo Garrett, DOB [birth year], MRN [id number]  PCP:  [name], MD  Cardiologist: Dr. Johnsie Cancel Dr Lovena Le  No chief complaint on file.   History of Present Illness:    72 y.o. CHF EF 35-40%, PAF, HTN, HLD Had abnormal stress test and set up for cath 11/23/17 with orbital atherectomy and stenting of mid circumflex with DES and PCI small OM branch done by Dr Dinah Beers. Plan for DAT month then Eliquis and Plavix. 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 Wife indicates he got drunk and smoked pot and fell/ passed out Can't afford PT/OT that helped him before  Needs replacement for losartan that has been recalled. Can stop ASA at this point and just continue plavix and eliquis  No chest pain      Past Medical History:  Diagnosis Date  . Arthritis    "finger on right hand" (11/23/2017)  . Broken arm ~ 1952   "no OR; just reset it; not sure which side"  . CAD (coronary artery disease)   . CHF (congestive heart failure) ([place])    a. 12/2015: echo showing reduced EF of 35-40% b. NST: 09/2017: scar with peri-infarct ischemia, intermediate-risk study  . COPD (chronic obstructive pulmonary disease) ([place])    "was on RX when he had CHF; not on anything now" (11/23/2017)  . High cholesterol   . HOH (hard of hearing)   . Hypertension   . Persistent atrial fibrillation ([place])    a. s/p DCCV in 03/2016    Past Surgical History:  Procedure Laterality Date  . CARDIAC CATHETERIZATION  11/23/2017  . CARDIOVERSION N/A 04/05/2016   Procedure: CARDIOVERSION;  Surgeon: Josue Hector, MD;  [facility];  Service: Cardiovascular;  Laterality: N/A;  . COMPRESSION HIP SCREW Right 03/15/2013   Procedure: COMPRESSION HIP;  Surgeon: Sanjuana Kava, MD;  Location: [facility];   Service: Orthopedics;  Laterality: Right;  . CORONARY ATHERECTOMY N/A 11/24/2017   Procedure: CORONARY ATHERECTOMY;  Surgeon: Burnell Blanks, MD;  Location: [place] [facility];  Service: Cardiovascular;  Laterality: N/A;  . CORONARY BALLOON ANGIOPLASTY N/A 11/24/2017   Procedure: CORONARY BALLOON ANGIOPLASTY;  Surgeon: Burnell Blanks, MD;  Location: [place] [facility];  Service: Cardiovascular;  Laterality: N/A;  . CORONARY STENT INTERVENTION N/A 11/24/2017   Procedure: CORONARY STENT INTERVENTION;  Surgeon: Burnell Blanks, MD;  Location: [place] [facility];  Service: Cardiovascular;  Laterality: N/A;  . FRACTURE SURGERY    . PACEMAKER IMPLANT N/A 11/27/2017   Procedure: PACEMAKER IMPLANT;  Surgeon: Evans Lance, MD;  Location: [place] [facility];  Service: Cardiovascular;  Laterality: N/A;  . RIGHT/LEFT HEART CATH AND CORONARY ANGIOGRAPHY N/A 11/23/2017   Procedure: RIGHT/LEFT HEART CATH AND CORONARY ANGIOGRAPHY;  Surgeon: Burnell Blanks, MD;  Location: [facility] [facility];  Service: Cardiovascular;  Laterality: N/A;  . SKIN GRAFT Left    from right thigh  . TONSILLECTOMY      Current Medications: Outpatient Medications Prior to Visit  Medication Sig Dispense Refill  . apixaban (ELIQUIS) 5 MG TABS tablet Take 1 tablet (5 mg total) by mouth 2 (two) times daily. Do not restart until seen in office for wound check appointment 60 tablet 6  .  aspirin EC 81 MG EC tablet Take 1 tablet (81 mg total) by mouth daily.    Marland Kitchen atorvastatin (LIPITOR) 40 MG tablet Take 40 mg by mouth every evening.     . clopidogrel (PLAVIX) 75 MG tablet Take 1 tablet (75 mg total) by mouth daily. 90 tablet 1  . digoxin (LANOXIN) 0.125 MG tablet TAKE 1 TABLET BY MOUTH ONCE DAILY (Patient taking differently: TAKE 0.125 MG BY MOUTH ONCE DAILY) 90 tablet 3  . furosemide (LASIX) 20 MG tablet Take 40 mg by mouth daily.    Marland Kitchen losartan (COZAAR) 50 MG tablet Take 50 mg by mouth daily.     .  metoprolol succinate (TOPROL-XL) 50 MG 24 hr tablet Take 1 tablet (50 mg total) by mouth 2 (two) times daily. (Patient taking differently: Take 50 mg by mouth 2 (two) times daily. Patient is taking 12.5 mg in the morning and 25 mg at night) 180 tablet 1  . naproxen sodium (ALEVE) 220 MG tablet Take 220 mg by mouth.    . benzonatate (TESSALON) 200 MG capsule Take 200 mg by mouth 3 (three) times daily as needed for cough.    . ezetimibe (ZETIA) 10 MG tablet Take 10 mg by mouth daily.     Marland Kitchen KLOR-CON M10 10 MEQ tablet TAKE 1 TABLET BY MOUTH ONCE DAILY 90 tablet 2   No facility-administered medications prior to visit.      Allergies:   Patient has no known allergies.   Social History   Socioeconomic History  . Marital status: Married    Spouse name: None  . Number of children: None  . Years of education: None  . Highest education level: None  Social Needs  . Financial resource strain: None  . Food insecurity - worry: None  . Food insecurity - inability: None  . Transportation needs - medical: None  . Transportation needs - non-medical: None  Occupational History  . None  Tobacco Use  . Smoking status: Former Smoker    Packs/day: 1.00    Years: 20.00    Pack years: 20.00    Types: Cigarettes    Last attempt to quit: 11/01/1979    Years since quitting: 38.2  . Smokeless tobacco: Never Used  Substance and Sexual Activity  . Alcohol use: Yes    Comment: 11/23/2017 "beer once/month maybe"  . Drug use: Yes    Types: Marijuana    Comment: 11/23/2017 "nothing for years"  . Sexual activity: Yes    Birth control/protection: None  Other Topics Concern  . None  Social History Narrative  . None     Family History:  The patient's family history includes Diabetes in his mother; Heart attack in his father.   Review of Systems:   Please see the history of present illness.     General:  No chills, fever, night sweats or weight changes. Positive for fatigue.  Cardiovascular:  No dyspnea  on exertion, edema, orthopnea, palpitations, paroxysmal nocturnal dyspnea. Positive for chest pain.  Dermatological: No rash, lesions/masses Respiratory: No cough, dyspnea Urologic: No hematuria, dysuria Abdominal:   No nausea, vomiting, diarrhea, bright red blood per rectum, melena, or hematemesis Neurologic:  No visual changes, wkns, changes in mental status. All other systems reviewed and are otherwise negative except as noted above.   Physical Exam:    VS:  BP (!) 159/100   Pulse 64   Ht 5\' 7"  (1.702 m)   Wt 153 lb 14.4 oz (69.8 kg)   SpO2  95%   BMI 24.10 kg/m    Affect appropriate Healthy:  appears stated age 56: normal Neck supple with no adenopathy JVP normal no bruits no thyromegaly Lungs clear with no wheezing and good diaphragmatic motion Heart:  S1/S2 no murmur, no rub, gallop or click PMI normal pacer under left clavicle  Abdomen: benighn, BS positve, no tenderness, no AAA no bruit.  No HSM or HJR Distal pulses intact with no bruits No edema Neuro non-focal Skin warm and dry Decreased ROM right hip     Wt Readings from Last 3 Encounters:  01/17/18 153 lb 14.4 oz (69.8 kg)  11/28/17 162 lb 11.2 oz (73.8 kg)  11/14/17 155 lb 6.4 oz (70.5 kg)     Studies/Labs Reviewed:   EKG:  EKG is not ordered today.   Recent Labs: 11/26/2017: BUN 15; Creatinine, Ser 1.36; Hemoglobin 15.1; Platelets 199; Potassium 4.4; Sodium 137   Lipid Panel    Component Value Date/Time   CHOL 193 02/12/2016   TRIG 196 02/12/2016   HDL 29 02/12/2016   CHOLHDL 6.7 02/12/2016   VLDL 39 02/12/2016   LDLCALC 125 02/12/2016    Additional studies/ records that were reviewed today include:   Echocardiogram: 12/2015 Study Conclusions  - Left ventricle: The cavity size was mildly dilated. Wall   thickness was increased in a pattern of moderate LVH. Systolic   function was moderately reduced. The estimated ejection fraction   was in the range of 35% to 40%. Diffuse  hypokinesis. - Mitral valve: There was mild regurgitation. - Left atrium: The atrium was mildly dilated. - Right ventricle: The cavity size was mildly dilated. - Right atrium: The atrium was mildly dilated. - Atrial septum: No defect or patent foramen ovale was identified. - Pulmonary arteries: PA peak pressure: 47 mm Hg (S).   NST: 10/27/2017  No diagnostic ST segment changes to indicate ischemia.  Moderate-sized, moderate intensity, inferior/inferoseptal defect that is partially reversible and suggestive of scar with mild peri-infarct ischemia.  This is an intermediate risk study.  Nuclear stress EF: 45%. Consider echocardiogram for more complete assessment.  Assessment:    No diagnosis found.   Plan:   In order of problems listed above:  1. Chronic Combined Systolic and Diastolic CHF: most recent EF 50-55% echo 11/25/17 improved continue medical Rx   2. Persistent Atrial Fibrillation [facility] 03/2016 continue eliquis .   3. HTN:  Well controlled.  Continue current medications and low sodium Dash type diet.     4. HLD: on statin labs with primary    6. Pacer:  Normal function by PaceArt f/u Dr Lovena Le   7. Ortho:  F/u Dr Luna Glasgow for x-rays and consider referral back to PT/OT  8. CAD: post stenting mid circumflex 11/23/17 Plavix DAT stopped after one month due to eliqus no angina   [name]    .

## 2018-01-17 ENCOUNTER — Ambulatory Visit: Payer: Medicare HMO | Admitting: Cardiovascular Disease

## 2018-01-17 ENCOUNTER — Encounter: Payer: Self-pay | Admitting: Cardiovascular Disease

## 2018-01-17 VITALS — BP 159/100 | HR 64 | Ht 67.0 in | Wt 153.9 lb

## 2018-01-17 DIAGNOSIS — I2510 Atherosclerotic heart disease of native coronary artery without angina pectoris: Secondary | ICD-10-CM | POA: Diagnosis not present

## 2018-01-17 MED ORDER — LISINOPRIL 40 MG PO TABS: 40.0000 mg | ORAL_TABLET | Freq: Every day | ORAL | 3 refills | Status: DC

## 2018-01-17 NOTE — Patient Instructions (Signed)
Medication Instructions:  STOP ASPIRIN  STOP LOSARTAN  START LISINOPRIL 40 MG DAILY  Labwork: NONE  Testing/Procedures: NONE  Follow-Up: Your physician wants you to follow-up in: 6 MONTHS  You will receive a reminder letter in the mail two months in advance. If you don't receive a letter, please call our office to schedule the follow-up appointment.   Any Other Special Instructions Will Be Listed Below (If Applicable).     If you need a refill on your cardiac medications before your next appointment, please call your pharmacy.

## 2018-02-27 DIAGNOSIS — I10 Essential (primary) hypertension: Secondary | ICD-10-CM | POA: Diagnosis not present

## 2018-02-27 DIAGNOSIS — N183 Chronic kidney disease, stage 3 (moderate): Secondary | ICD-10-CM | POA: Diagnosis not present

## 2018-02-27 DIAGNOSIS — Z79899 Other long term (current) drug therapy: Secondary | ICD-10-CM | POA: Diagnosis not present

## 2018-02-27 DIAGNOSIS — E875 Hyperkalemia: Secondary | ICD-10-CM | POA: Diagnosis not present

## 2018-02-27 DIAGNOSIS — J449 Chronic obstructive pulmonary disease, unspecified: Secondary | ICD-10-CM | POA: Diagnosis not present

## 2018-02-27 DIAGNOSIS — R809 Proteinuria, unspecified: Secondary | ICD-10-CM | POA: Diagnosis not present

## 2018-02-27 DIAGNOSIS — I509 Heart failure, unspecified: Secondary | ICD-10-CM | POA: Diagnosis not present

## 2018-02-27 DIAGNOSIS — D649 Anemia, unspecified: Secondary | ICD-10-CM | POA: Diagnosis not present

## 2018-03-01 ENCOUNTER — Encounter: Payer: Self-pay | Admitting: Internal Medicine

## 2018-03-01 ENCOUNTER — Ambulatory Visit: Payer: Medicare HMO | Admitting: Internal Medicine

## 2018-03-01 VITALS — BP 120/80 | HR 49 | Ht 67.0 in | Wt 145.0 lb

## 2018-03-01 DIAGNOSIS — I481 Persistent atrial fibrillation: Secondary | ICD-10-CM

## 2018-03-01 DIAGNOSIS — I495 Sick sinus syndrome: Secondary | ICD-10-CM | POA: Diagnosis not present

## 2018-03-01 DIAGNOSIS — I4819 Other persistent atrial fibrillation: Secondary | ICD-10-CM

## 2018-03-01 NOTE — Patient Instructions (Signed)
Medication Instructions:  Your physician recommends that you continue on your current medications as directed. Please refer to the Current Medication list given to you today.   Labwork: NONE   Testing/Procedures: NONE   Follow-Up: Your physician wants you to follow-up in: 9 Months with Dr. Taylor. You will receive a reminder letter in the mail two months in advance. If you don't receive a letter, please call our office to schedule the follow-up appointment.   Any Other Special Instructions Will Be Listed Below (If Applicable).     If you need a refill on your cardiac medications before your next appointment, please call your pharmacy. Thank you for choosing [facility]!    

## 2018-03-01 NOTE — Progress Notes (Signed)
HPI Ricardo Garrett returns today for ongoing evaluation of atrial fibrillation and his permanent pacemaker.  He is a very pleasant hard of hearing 72 year old man with persistent atrial fibrillation, symptomatic bradycardia, coronary artery disease status post stenting, and a remote history of tobacco abuse.  Despite being on an oral anticoagulant as well as Plavix, he has had no bleeding.  He has dyspnea with exertion but denies anginal symptoms.  He complains of pain in his legs when he walks as well.  He has not had syncope.  His ventricular rate and atrial fibrillation has been reasonably well controlled.  He is pacing the ventricle approximately 45% of the time. No Known Allergies   Current Outpatient Medications  Medication Sig Dispense Refill  . apixaban (ELIQUIS) 5 MG TABS tablet Take 1 tablet (5 mg total) by mouth 2 (two) times daily. Do not restart until seen in office for wound check appointment 60 tablet 6  . atorvastatin (LIPITOR) 40 MG tablet Take 40 mg by mouth every evening.     . clopidogrel (PLAVIX) 75 MG tablet Take 1 tablet (75 mg total) by mouth daily. 90 tablet 1  . digoxin (LANOXIN) 0.125 MG tablet TAKE 1 TABLET BY MOUTH ONCE DAILY (Patient taking differently: TAKE 0.125 MG BY MOUTH ONCE DAILY) 90 tablet 3  . ezetimibe (ZETIA) 10 MG tablet Take 1 tablet by mouth daily.    . furosemide (LASIX) 40 MG tablet Take 40 mg by mouth daily.    Marland Kitchen lisinopril (PRINIVIL,ZESTRIL) 40 MG tablet Take 1 tablet (40 mg total) by mouth daily. 90 tablet 3  . metoprolol succinate (TOPROL-XL) 25 MG 24 hr tablet Take 12.5 mg by mouth every morning. & 50 mg by mouth in the evening     No current facility-administered medications for this visit.      Past Medical History:  Diagnosis Date  . Arthritis    "finger on right hand" (11/23/2017)  . Broken arm ~ 1952   "no OR; just reset it; not sure which side"  . CAD (coronary artery disease)   . CHF (congestive heart failure) ([place])    a.  12/2015: echo showing reduced EF of 35-40% b. NST: 09/2017: scar with peri-infarct ischemia, intermediate-risk study  . COPD (chronic obstructive pulmonary disease) ([place])    "was on RX when he had CHF; not on anything now" (11/23/2017)  . High cholesterol   . HOH (hard of hearing)   . Hypertension   . Persistent atrial fibrillation ([place])    a. s/p DCCV in 03/2016    ROS:   All systems reviewed and negative except as noted in the HPI.   Past Surgical History:  Procedure Laterality Date  . CARDIAC CATHETERIZATION  11/23/2017  . CARDIOVERSION N/A 04/05/2016   Procedure: CARDIOVERSION;  Surgeon: Josue Hector, MD;  [facility];  Service: Cardiovascular;  Laterality: N/A;  . COMPRESSION HIP SCREW Right 03/15/2013   Procedure: COMPRESSION HIP;  Surgeon: Sanjuana Kava, MD;  Location: [facility];  Service: Orthopedics;  Laterality: Right;  . CORONARY ATHERECTOMY N/A 11/24/2017   Procedure: CORONARY ATHERECTOMY;  Surgeon: Burnell Blanks, MD;  Location: [place] [facility];  Service: Cardiovascular;  Laterality: N/A;  . CORONARY BALLOON ANGIOPLASTY N/A 11/24/2017   Procedure: CORONARY BALLOON ANGIOPLASTY;  Surgeon: Burnell Blanks, MD;  Location: [place] [facility];  Service: Cardiovascular;  Laterality: N/A;  . CORONARY STENT INTERVENTION N/A 11/24/2017   Procedure: CORONARY STENT INTERVENTION;  Surgeon: Angelena Form,  Annita Brod, MD;  Location: [place] [facility];  Service: Cardiovascular;  Laterality: N/A;  . FRACTURE SURGERY    . PACEMAKER IMPLANT N/A 11/27/2017   Procedure: PACEMAKER IMPLANT;  Surgeon: Evans Lance, MD;  Location: [place] [facility];  Service: Cardiovascular;  Laterality: N/A;  . RIGHT/LEFT HEART CATH AND CORONARY ANGIOGRAPHY N/A 11/23/2017   Procedure: RIGHT/LEFT HEART CATH AND CORONARY ANGIOGRAPHY;  Surgeon: Burnell Blanks, MD;  Location: [place] [facility];  Service: Cardiovascular;  Laterality: N/A;  . SKIN GRAFT Left    from right thigh  .  TONSILLECTOMY       Family History  Problem Relation Age of Onset  . Diabetes Mother   . Heart attack Father      Social History   Socioeconomic History  . Marital status: Married    Spouse name: Not on file  . Number of children: Not on file  . Years of education: Not on file  . Highest education level: Not on file  Occupational History  . Not on file  Social Needs  . Financial resource strain: Not on file  . Food insecurity:    Worry: Not on file    Inability: Not on file  . Transportation needs:    Medical: Not on file    Non-medical: Not on file  Tobacco Use  . Smoking status: Former Smoker    Packs/day: 1.00    Years: 20.00    Pack years: 20.00    Types: Cigarettes    Last attempt to quit: 11/01/1979    Years since quitting: 38.3  . Smokeless tobacco: Never Used  Substance and Sexual Activity  . Alcohol use: Yes    Comment: 11/23/2017 "beer once/month maybe"  . Drug use: Yes    Types: Marijuana    Comment: 11/23/2017 "nothing for years"  . Sexual activity: Yes    Birth control/protection: None  Lifestyle  . Physical activity:    Days per week: Not on file    Minutes per session: Not on file  . Stress: Not on file  Relationships  . Social connections:    Talks on phone: Not on file    Gets together: Not on file    Attends religious service: Not on file    Active member of club or organization: Not on file    Attends meetings of clubs or organizations: Not on file    Relationship status: Not on file  . Intimate partner violence:    Fear of current or ex partner: Not on file    Emotionally abused: Not on file    Physically abused: Not on file    Forced sexual activity: Not on file  Other Topics Concern  . Not on file  Social History Narrative  . Not on file     BP 120/80   Pulse (!) 49   Ht 5\' 7"  (1.702 m)   Wt 145 lb (65.8 kg)   SpO2 97% Comment: on room air  BMI 22.71 kg/m   Physical Exam:  Well appearing NAD HEENT: Unremarkable Neck:   No JVD, no thyromegally Lymphatics:  No adenopathy Back:  No CVA tenderness Lungs:  Clear, with no wheezes, rales, or rhonchi HEART:  IRegular rate rhythm, no murmurs, no rubs, no clicks Abd:  soft, positive bowel sounds, no organomegally, no rebound, no guarding Ext:  2 plus pulses, no edema, no cyanosis, no clubbing Skin:  No rashes no nodules Neuro:  CN II through XII intact,  motor grossly intact  DEVICE  Normal device function.  See PaceArt for details.   Assess/Plan: 1.  Atrial fibrillation -his ventricular rate is reasonably well controlled.  He is pacing about half the time.  I considered additional AV nodal blocking agents, but for now we will hold off on making any medication changes for rate control. 2.  Coronary artery disease -he is status post percutaneous coronary intervention, and has no anginal symptoms. 3.  Hypertension -his blood pressure is much better controlled today.  He will continue his current medications.  I have encouraged the patient to maintain a low-sodium diet. 4.  Pacemaker -his [place] dual-chamber pacemaker is working normally.  We will recheck in several months.  Mikle Bosworth.D.

## 2018-03-02 DIAGNOSIS — E875 Hyperkalemia: Secondary | ICD-10-CM | POA: Diagnosis not present

## 2018-03-14 ENCOUNTER — Ambulatory Visit: Payer: Self-pay | Admitting: Orthopaedic Surgery

## 2018-03-20 ENCOUNTER — Ambulatory Visit: Payer: Medicare HMO | Admitting: Orthopaedic Surgery

## 2018-03-20 ENCOUNTER — Ambulatory Visit (INDEPENDENT_AMBULATORY_CARE_PROVIDER_SITE_OTHER): Payer: Medicare HMO

## 2018-03-20 ENCOUNTER — Encounter: Payer: Self-pay | Admitting: Orthopaedic Surgery

## 2018-03-20 VITALS — BP 127/71 | HR 95 | Ht 67.0 in | Wt 143.0 lb

## 2018-03-20 DIAGNOSIS — I4891 Unspecified atrial fibrillation: Secondary | ICD-10-CM

## 2018-03-20 DIAGNOSIS — M25561 Pain in right knee: Secondary | ICD-10-CM

## 2018-03-20 DIAGNOSIS — G8929 Other chronic pain: Secondary | ICD-10-CM | POA: Diagnosis not present

## 2018-03-20 DIAGNOSIS — M25551 Pain in right hip: Secondary | ICD-10-CM | POA: Diagnosis not present

## 2018-03-20 DIAGNOSIS — Z7901 Long term (current) use of anticoagulants: Secondary | ICD-10-CM

## 2018-03-20 MED ORDER — HYDROCODONE-ACETAMINOPHEN 5-325 MG PO TABS: ORAL_TABLET | ORAL | 0 refills | Status: DC

## 2018-03-20 MED ORDER — PREDNISONE 5 MG (21) PO TBPK: ORAL_TABLET | ORAL | 0 refills | Status: DC

## 2018-03-20 NOTE — Progress Notes (Signed)
Subjective:  My right hip hurts, my right leg hurts,  I just hurt    Patient ID: Ricardo Garrett, male    DOB: 1946/09/24, 72 y.o.   MRN: [id number]  HPI I have not seen him since 2015.  I did surgery on his right hip several years ago before then.  He had a hip compression screw with medial displacement procedure.  He did well initially.    He tells me he was doing well with the hip until a couple of years ago. He was drinking too much and fell off a picnic table.  He has been limping since then.  He has right hip pain and right knee pain. He has some paresthesias at times.  He has no other trauma he can remember.  He has severe hearing problems and just got hearing aids but forgot them today.  His wife accompanies him and helps me with the history.  He is taking some Tylenol for the pain but it does not help much.  He has no redness, no weakness, no bowel or bladder problems.  He cannot take NSAIDs because of atrial fib and anticoagulate medicines.  Review of Systems  Constitutional: Positive for activity change.  HENT: Positive for hearing loss.   Respiratory: Positive for cough and shortness of breath.   Cardiovascular: Negative for chest pain and leg swelling.  Endocrine: Positive for cold intolerance.  Musculoskeletal: Positive for arthralgias, gait problem and joint swelling.  Allergic/Immunologic: Positive for environmental allergies.  Psychiatric/Behavioral: Positive for sleep disturbance. The patient is nervous/anxious.    Past Medical History:  Diagnosis Date  . Arthritis    "finger on right hand" (11/23/2017)  . Broken arm ~ 1952   "no OR; just reset it; not sure which side"  . CAD (coronary artery disease)   . CHF (congestive heart failure) ([place])    a. 12/2015: echo showing reduced EF of 35-40% b. NST: 09/2017: scar with peri-infarct ischemia, intermediate-risk study  . COPD (chronic obstructive pulmonary disease) ([place])    "was on RX when he had CHF; not on anything  now" (11/23/2017)  . High cholesterol   . HOH (hard of hearing)   . Hypertension   . Persistent atrial fibrillation ([place])    a. s/p DCCV in 03/2016    Past Surgical History:  Procedure Laterality Date  . CARDIAC CATHETERIZATION  11/23/2017  . CARDIOVERSION N/A 04/05/2016   Procedure: CARDIOVERSION;  Surgeon: Josue Hector, MD;  [facility];  Service: Cardiovascular;  Laterality: N/A;  . COMPRESSION HIP SCREW Right 03/15/2013   Procedure: COMPRESSION HIP;  Surgeon: Sanjuana Kava, MD;  Location: [facility];  Service: Orthopedics;  Laterality: Right;  . CORONARY ATHERECTOMY N/A 11/24/2017   Procedure: CORONARY ATHERECTOMY;  Surgeon: Burnell Blanks, MD;  Location: [facility];  Service: Cardiovascular;  Laterality: N/A;  . CORONARY BALLOON ANGIOPLASTY N/A 11/24/2017   Procedure: CORONARY BALLOON ANGIOPLASTY;  Surgeon: Burnell Blanks, MD;  Location: [facility];  Service: Cardiovascular;  Laterality: N/A;  . CORONARY STENT INTERVENTION N/A 11/24/2017   Procedure: CORONARY STENT INTERVENTION;  Surgeon: Burnell Blanks, MD;  Location: [facility];  Service: Cardiovascular;  Laterality: N/A;  . FRACTURE SURGERY    . PACEMAKER IMPLANT N/A 11/27/2017   Procedure: PACEMAKER IMPLANT;  Surgeon: Evans Lance, MD;  Location: [facility];  Service: Cardiovascular;  Laterality: N/A;  . RIGHT/LEFT HEART CATH AND CORONARY ANGIOGRAPHY N/A 11/23/2017  Procedure: RIGHT/LEFT HEART CATH AND CORONARY ANGIOGRAPHY;  Surgeon: Burnell Blanks, MD;  Location: [facility];  Service: Cardiovascular;  Laterality: N/A;  . SKIN GRAFT Left    from right thigh  . TONSILLECTOMY      Current Outpatient Medications on File Prior to Visit  Medication Sig Dispense Refill  . apixaban (ELIQUIS) 5 MG TABS tablet Take 1 tablet (5 mg total) by mouth 2 (two) times daily. Do not restart until seen in office for wound check appointment 60 tablet 6  .  atorvastatin (LIPITOR) 40 MG tablet Take 40 mg by mouth every evening.     . clopidogrel (PLAVIX) 75 MG tablet Take 1 tablet (75 mg total) by mouth daily. 90 tablet 1  . digoxin (LANOXIN) 0.125 MG tablet TAKE 1 TABLET BY MOUTH ONCE DAILY (Patient taking differently: TAKE 0.125 MG BY MOUTH ONCE DAILY) 90 tablet 3  . ezetimibe (ZETIA) 10 MG tablet Take 1 tablet by mouth daily.    . furosemide (LASIX) 40 MG tablet Take 40 mg by mouth daily.    Marland Kitchen lisinopril (PRINIVIL,ZESTRIL) 40 MG tablet Take 1 tablet (40 mg total) by mouth daily. 90 tablet 3  . metoprolol succinate (TOPROL-XL) 25 MG 24 hr tablet Take 12.5 mg by mouth every morning. & 50 mg by mouth in the evening     No current facility-administered medications on file prior to visit.     Social History   Socioeconomic History  . Marital status: Married    Spouse name: Not on file  . Number of children: Not on file  . Years of education: Not on file  . Highest education level: Not on file  Occupational History  . Not on file  Social Needs  . Financial resource strain: Not on file  . Food insecurity:    Worry: Not on file    Inability: Not on file  . Transportation needs:    Medical: Not on file    Non-medical: Not on file  Tobacco Use  . Smoking status: Former Smoker    Packs/day: 1.00    Years: 20.00    Pack years: 20.00    Types: Cigarettes    Last attempt to quit: 11/01/1979    Years since quitting: 38.4  . Smokeless tobacco: Never Used  Substance and Sexual Activity  . Alcohol use: Yes    Comment: 11/23/2017 "beer once/month maybe"  . Drug use: Yes    Types: Marijuana    Comment: 11/23/2017 "nothing for years"  . Sexual activity: Yes    Birth control/protection: None  Lifestyle  . Physical activity:    Days per week: Not on file    Minutes per session: Not on file  . Stress: Not on file  Relationships  . Social connections:    Talks on phone: Not on file    Gets together: Not on file    Attends religious service:  Not on file    Active member of club or organization: Not on file    Attends meetings of clubs or organizations: Not on file    Relationship status: Not on file  . Intimate partner violence:    Fear of current or ex partner: Not on file    Emotionally abused: Not on file    Physically abused: Not on file    Forced sexual activity: Not on file  Other Topics Concern  . Not on file  Social History Narrative  . Not on file  Family History  Problem Relation Age of Onset  . Diabetes Mother   . Heart attack Father     BP 127/71   Pulse 95   Ht 5\' 7"  (1.702 m)   Wt 143 lb (64.9 kg)   BMI 22.40 kg/m      Objective:   Physical Exam  Constitutional: He is oriented to person, place, and time. He appears well-developed and well-nourished.  HENT:  Head: Normocephalic and atraumatic.  Eyes: Pupils are equal, round, and reactive to light. Conjunctivae and EOM are normal.  Neck: Normal range of motion. Neck supple.  Cardiovascular: Normal rate, regular rhythm and intact distal pulses.  Pulmonary/Chest: Effort normal.  Abdominal: Soft.  Musculoskeletal:       Right hip: He exhibits decreased range of motion.       Right knee: He exhibits decreased range of motion, swelling and effusion. Tenderness found. Medial joint line tenderness noted.       Legs: Neurological: He is alert and oriented to person, place, and time. He has normal reflexes. He displays normal reflexes. No cranial nerve deficit. He exhibits normal muscle tone. Coordination normal.  Skin: Skin is warm and dry.  Psychiatric: He has a normal mood and affect. His behavior is normal. Judgment and thought content normal.   X-rays were done of the right hip and right knee, reported separately.       Assessment & Plan:   Encounter Diagnoses  Name Primary?  . Pain of right hip joint Yes  . Chronic pain of right knee   . On continuous oral anticoagulation   . Atrial fibrillation with rapid ventricular response ([place])      I have given Rx for prednisone dose pack and pain medicine.  Precautions discussed.  Return in one week.  He may need x-rays of the lower back.  I had difficulty in getting history and with his poor hearing, having him understand fully.  I have explained to his wife and she appears to fully understand.  I have reviewed the [place] web site prior to prescribing narcotic medicine for this patient.  Call if any problem.  Precautions discussed.   Electronically Signed Sanjuana Kava, MD 5/21/20193:41 PM

## 2018-03-27 ENCOUNTER — Ambulatory Visit (INDEPENDENT_AMBULATORY_CARE_PROVIDER_SITE_OTHER): Payer: Medicare HMO | Admitting: *Deleted

## 2018-03-27 DIAGNOSIS — I495 Sick sinus syndrome: Secondary | ICD-10-CM

## 2018-03-27 NOTE — Progress Notes (Signed)
Remote pacemaker transmission.   

## 2018-03-29 ENCOUNTER — Ambulatory Visit: Payer: Medicare HMO | Admitting: Orthopaedic Surgery

## 2018-03-29 ENCOUNTER — Encounter: Payer: Self-pay | Admitting: Orthopaedic Surgery

## 2018-03-29 ENCOUNTER — Ambulatory Visit (INDEPENDENT_AMBULATORY_CARE_PROVIDER_SITE_OTHER): Payer: Medicare HMO

## 2018-03-29 VITALS — BP 128/83 | HR 72 | Temp 97.2°F | Ht 68.0 in | Wt 142.0 lb

## 2018-03-29 DIAGNOSIS — M5441 Lumbago with sciatica, right side: Secondary | ICD-10-CM

## 2018-03-29 DIAGNOSIS — G8929 Other chronic pain: Secondary | ICD-10-CM | POA: Diagnosis not present

## 2018-03-29 NOTE — Patient Instructions (Signed)
..  Your MRI has been ordered.  We will contact your insurance company for approval. After the authorization is received the MRI and a return appointment will be scheduled with you by phone.  If you do not hear from us within 5 business days please call [phone] and ask for the pre-authorization representative in our office.  

## 2018-03-29 NOTE — Progress Notes (Signed)
MRI

## 2018-03-29 NOTE — Progress Notes (Signed)
Patient Ricardo Garrett, male DOB:1946-05-03, 72 y.o. WFU:[id number]  Chief Complaint  Patient presents with  . Hip Pain    right   . Back Pain    HPI  Ricardo Garrett is a 72 y.o. male who has right hip pain, right lower back pain. He is worse.  I had done x-rays of the right hip last visit.  I gave him prednisone dose pack.  It did not help.  He has pain of the lower back on the right side with right sided sciatica now.  He has no new trauma.  I will get x-rays today of the lumbar spine. HPI  Body mass index is 21.59 kg/m.  ROS  Review of Systems  Constitutional: Positive for activity change.  HENT: Positive for hearing loss.   Respiratory: Positive for cough and shortness of breath.   Cardiovascular: Negative for chest pain and leg swelling.  Endocrine: Positive for cold intolerance.  Musculoskeletal: Positive for arthralgias, gait problem and joint swelling.  Allergic/Immunologic: Positive for environmental allergies.  Psychiatric/Behavioral: Positive for sleep disturbance. The patient is nervous/anxious.   All other systems reviewed and are negative.   Past Medical History:  Diagnosis Date  . Arthritis    "finger on right hand" (11/23/2017)  . Broken arm ~ 1952   "no OR; just reset it; not sure which side"  . CAD (coronary artery disease)   . CHF (congestive heart failure) ([place])    a. 12/2015: echo showing reduced EF of 35-40% b. NST: 09/2017: scar with peri-infarct ischemia, intermediate-risk study  . COPD (chronic obstructive pulmonary disease) ([place])    "was on RX when he had CHF; not on anything now" (11/23/2017)  . High cholesterol   . HOH (hard of hearing)   . Hypertension   . Persistent atrial fibrillation ([place])    a. s/p DCCV in 03/2016    Past Surgical History:  Procedure Laterality Date  . CARDIAC CATHETERIZATION  11/23/2017  . CARDIOVERSION N/A 04/05/2016   Procedure: CARDIOVERSION;  Surgeon: Josue Hector, MD;  [facility];  Service:  Cardiovascular;  Laterality: N/A;  . COMPRESSION HIP SCREW Right 03/15/2013   Procedure: COMPRESSION HIP;  Surgeon: Sanjuana Kava, MD;  Location: [facility];  Service: Orthopedics;  Laterality: Right;  . CORONARY ATHERECTOMY N/A 11/24/2017   Procedure: CORONARY ATHERECTOMY;  Surgeon: Burnell Blanks, MD;  Location: [facility];  Service: Cardiovascular;  Laterality: N/A;  . CORONARY BALLOON ANGIOPLASTY N/A 11/24/2017   Procedure: CORONARY BALLOON ANGIOPLASTY;  Surgeon: Burnell Blanks, MD;  Location: [facility];  Service: Cardiovascular;  Laterality: N/A;  . CORONARY STENT INTERVENTION N/A 11/24/2017   Procedure: CORONARY STENT INTERVENTION;  Surgeon: Burnell Blanks, MD;  Location: [facility];  Service: Cardiovascular;  Laterality: N/A;  . FRACTURE SURGERY    . PACEMAKER IMPLANT N/A 11/27/2017   Procedure: PACEMAKER IMPLANT;  Surgeon: Evans Lance, MD;  Location: [facility];  Service: Cardiovascular;  Laterality: N/A;  . RIGHT/LEFT HEART CATH AND CORONARY ANGIOGRAPHY N/A 11/23/2017   Procedure: RIGHT/LEFT HEART CATH AND CORONARY ANGIOGRAPHY;  Surgeon: Burnell Blanks, MD;  Location: [facility];  Service: Cardiovascular;  Laterality: N/A;  . SKIN GRAFT Left    from right thigh  . TONSILLECTOMY      Family History  Problem Relation Age of Onset  . Diabetes Mother   . Heart attack Father     Social History Social History  Tobacco Use  . Smoking status: Former Smoker    Packs/day: 1.00    Years: 20.00    Pack years: 20.00    Types: Cigarettes    Last attempt to quit: 11/01/1979    Years since quitting: 38.4  . Smokeless tobacco: Never Used  Substance Use Topics  . Alcohol use: Yes    Comment: 11/23/2017 "beer once/month maybe"  . Drug use: Yes    Types: Marijuana    Comment: 11/23/2017 "nothing for years"    No Known Allergies  Current Outpatient Medications  Medication Sig Dispense Refill  . apixaban (ELIQUIS)  5 MG TABS tablet Take 1 tablet (5 mg total) by mouth 2 (two) times daily. Do not restart until seen in office for wound check appointment 60 tablet 6  . atorvastatin (LIPITOR) 40 MG tablet Take 40 mg by mouth every evening.     . clopidogrel (PLAVIX) 75 MG tablet Take 1 tablet (75 mg total) by mouth daily. 90 tablet 1  . digoxin (LANOXIN) 0.125 MG tablet TAKE 1 TABLET BY MOUTH ONCE DAILY (Patient taking differently: TAKE 0.125 MG BY MOUTH ONCE DAILY) 90 tablet 3  . ezetimibe (ZETIA) 10 MG tablet Take 1 tablet by mouth daily.    . furosemide (LASIX) 40 MG tablet Take 40 mg by mouth daily.    Marland Kitchen HYDROcodone-acetaminophen (NORCO/VICODIN) 5-325 MG tablet One tablet every four hours as needed for acute pain.  Limit of five days per [place]. 30 tablet 0  . KLOR-CON M10 10 MEQ tablet Take 10 mEq by mouth daily.  2  . lisinopril (PRINIVIL,ZESTRIL) 40 MG tablet Take 1 tablet (40 mg total) by mouth daily. 90 tablet 3  . losartan (COZAAR) 50 MG tablet   5  . metoprolol succinate (TOPROL-XL) 25 MG 24 hr tablet Take 12.5 mg by mouth every morning. & 50 mg by mouth in the evening     No current facility-administered medications for this visit.      Physical Exam  Blood pressure 128/83, pulse 72, temperature (!) 97.2 F (36.2 C), height 5\' 8"  (1.727 m), weight 142 lb (64.4 kg).  Constitutional: overall normal hygiene, normal nutrition, well developed, normal grooming, normal body habitus. Assistive device:none  Musculoskeletal: gait and station Limp right, muscle tone and strength are normal, no tremors or atrophy is present.  .  Neurological: coordination overall normal.  Deep tendon reflex/nerve stretch intact.  Sensation normal.  Cranial nerves II-XII intact.   Skin:   Normal overall no scars, lesions, ulcers or rashes. No psoriasis.  Psychiatric: Alert and oriented x 3.  Recent memory intact, remote memory unclear.  Normal mood and affect. Well groomed.  Good eye contact.  Cardiovascular:  overall no swelling, no varicosities, no edema bilaterally, normal temperatures of the legs and arms, no clubbing, cyanosis and good capillary refill.  Lymphatic: palpation is normal.  Spine/Pelvis examination:  Inspection:  Overall, sacoiliac joint benign and hips nontender; without crepitus or defects.   Thoracic spine inspection: Alignment normal without kyphosis present   Lumbar spine inspection:  Alignment  with normal lumbar lordosis, with scoliosis apparent. It is to the right apex mid lumbar spine.   Thoracic spine palpation:  without tenderness of spinal processes   Lumbar spine palpation: with tenderness of lumbar area; with tightness of lumbar muscles    Range of Motion:   Lumbar flexion, forward flexion is 25 with pain or tenderness    Lumbar extension is 5 with pain or tenderness  Left lateral bend is Normal  without pain or tenderness   Right lateral bend is Normal without pain or tenderness   Straight leg raising is Normal   Strength & tone: Normal   Stability overall normal stability    All other systems reviewed and are negative   The patient has been educated about the nature of the problem(s) and counseled on treatment options.  The patient appeared to understand what I have discussed and is in agreement with it.  X-rays of the lumbar spine were done, reported separately.  Encounter Diagnosis  Name Primary?  . Chronic midline low back pain with right-sided sciatica Yes    I would like to get a MRI of the lumbar spine.  I have shown him and his wife his plain films and explained need for MRI.  I am concerned about a HNP.  He has a pacemaker inserted about a month ago but I am told the new pacemakers can undergo MRI at the [facility] unit.  We will send him there.  PLAN Call if any problems.  Precautions discussed.  Continue current medications.   Return to clinic after MRI of the lumbar spine.   Electronically Signed Sanjuana Kava, MD 5/30/201910:34  AM

## 2018-03-30 ENCOUNTER — Encounter: Payer: Self-pay | Admitting: Cardiology

## 2018-03-30 ENCOUNTER — Telehealth: Payer: Self-pay | Admitting: Radiology

## 2018-03-30 NOTE — Telephone Encounter (Signed)
MRI was approved I called [facility] to schedule, but the information had to be sent to [facility] MRI tech in order to approve the pacemaker. I asked Ricardo Garrett to please call me back today about this since I will not be back in the office until Tuesday

## 2018-03-30 NOTE — Progress Notes (Signed)
Letter  

## 2018-04-03 ENCOUNTER — Telehealth: Payer: Self-pay | Admitting: Radiology

## 2018-04-03 NOTE — Telephone Encounter (Signed)
June 11th ar 1pm, arrive at 12:30, advised and made follow up appt with Dr Luna Glasgow

## 2018-04-10 ENCOUNTER — Ambulatory Visit (HOSPITAL_COMMUNITY)
Admission: RE | Admit: 2018-04-10 | Discharge: 2018-04-10 | Disposition: A | Discharge status: Home / Self Care | Payer: Medicare HMO | Source: Ambulatory Visit | Attending: Orthopaedic Surgery | Admitting: Orthopaedic Surgery

## 2018-04-10 DIAGNOSIS — G8929 Other chronic pain: Secondary | ICD-10-CM | POA: Diagnosis not present

## 2018-04-10 DIAGNOSIS — M545 Low back pain: Secondary | ICD-10-CM | POA: Diagnosis not present

## 2018-04-10 DIAGNOSIS — M5441 Lumbago with sciatica, right side: Secondary | ICD-10-CM | POA: Insufficient documentation

## 2018-04-11 ENCOUNTER — Encounter: Payer: Self-pay | Admitting: Orthopaedic Surgery

## 2018-04-11 ENCOUNTER — Ambulatory Visit: Payer: Medicare HMO | Admitting: Orthopaedic Surgery

## 2018-04-11 VITALS — BP 94/64 | HR 62 | Ht 68.0 in | Wt 139.0 lb

## 2018-04-11 DIAGNOSIS — G8929 Other chronic pain: Secondary | ICD-10-CM | POA: Diagnosis not present

## 2018-04-11 DIAGNOSIS — M5441 Lumbago with sciatica, right side: Secondary | ICD-10-CM | POA: Diagnosis not present

## 2018-04-11 NOTE — Progress Notes (Signed)
Patient Ricardo Garrett, male DOB:Apr 19, 1946, 72 y.o. OJJ:[id number]  Chief Complaint  Patient presents with  . Back Pain  . Results    review MRI     HPI  Ricardo Garrett is a 72 y.o. male who has pain in the lower back radiating to the right.  He had a MRI yesterday and it showed: IMPRESSION: Motion degraded examination demonstrating mild multilevel disc pathology, but a dominant RIGHT-sided compressive lesion is not clearly identified.  Asymmetric facet arthropathy at L5-S1 on the RIGHT. Correlate clinically for facet mediated lumbago.  It is also possible the L3-4 level is symptomatic, with a shallow central protrusion, and facet arthropathy contributing to mild Stenosis.  I will have neurosurgeon see him.  There is motion on the MRI and the study may need to be repeated.  Patient is agreeable to see neurosurgeon. HPI  Body mass index is 21.13 kg/m.  ROS  Review of Systems  Constitutional: Positive for activity change.  HENT: Positive for hearing loss.   Respiratory: Positive for cough and shortness of breath.   Cardiovascular: Negative for chest pain and leg swelling.  Endocrine: Positive for cold intolerance.  Musculoskeletal: Positive for arthralgias, gait problem and joint swelling.  Allergic/Immunologic: Positive for environmental allergies.  Psychiatric/Behavioral: Positive for sleep disturbance. The patient is nervous/anxious.   All other systems reviewed and are negative.   Past Medical History:  Diagnosis Date  . Arthritis    "finger on right hand" (11/23/2017)  . Broken arm ~ 1952   "no OR; just reset it; not sure which side"  . CAD (coronary artery disease)   . CHF (congestive heart failure) ([place])    a. 12/2015: echo showing reduced EF of 35-40% b. NST: 09/2017: scar with peri-infarct ischemia, intermediate-risk study  . COPD (chronic obstructive pulmonary disease) ([place])    "was on RX when he had CHF; not on anything now" (11/23/2017)  . High  cholesterol   . HOH (hard of hearing)   . Hypertension   . Persistent atrial fibrillation ([place])    a. s/p DCCV in 03/2016    Past Surgical History:  Procedure Laterality Date  . CARDIAC CATHETERIZATION  11/23/2017  . CARDIOVERSION N/A 04/05/2016   Procedure: CARDIOVERSION;  Surgeon: Josue Hector, MD;  [facility];  Service: Cardiovascular;  Laterality: N/A;  . COMPRESSION HIP SCREW Right 03/15/2013   Procedure: COMPRESSION HIP;  Surgeon: Sanjuana Kava, MD;  Location: [facility];  Service: Orthopedics;  Laterality: Right;  . CORONARY ATHERECTOMY N/A 11/24/2017   Procedure: CORONARY ATHERECTOMY;  Surgeon: Burnell Blanks, MD;  Location: [place];  Service: Cardiovascular;  Laterality: N/A;  . CORONARY BALLOON ANGIOPLASTY N/A 11/24/2017   Procedure: CORONARY BALLOON ANGIOPLASTY;  Surgeon: Burnell Blanks, MD;  Location: [place] [facility];  Service: Cardiovascular;  Laterality: N/A;  . CORONARY STENT INTERVENTION N/A 11/24/2017   Procedure: CORONARY STENT INTERVENTION;  Surgeon: Burnell Blanks, MD;  Location: [place] [facility];  Service: Cardiovascular;  Laterality: N/A;  . FRACTURE SURGERY    . PACEMAKER IMPLANT N/A 11/27/2017   Procedure: PACEMAKER IMPLANT;  Surgeon: Evans Lance, MD;  Location: [place] [facility];  Service: Cardiovascular;  Laterality: N/A;  . RIGHT/LEFT HEART CATH AND CORONARY ANGIOGRAPHY N/A 11/23/2017   Procedure: RIGHT/LEFT HEART CATH AND CORONARY ANGIOGRAPHY;  Surgeon: Burnell Blanks, MD;  Location: [place] [facility];  Service: Cardiovascular;  Laterality: N/A;  . SKIN GRAFT Left    from right  thigh  . TONSILLECTOMY      Family History  Problem Relation Age of Onset  . Diabetes Mother   . Heart attack Father     Social History Social History   Tobacco Use  . Smoking status: Former Smoker    Packs/day: 1.00    Years: 20.00    Pack years: 20.00    Types: Cigarettes    Last attempt to quit: 11/01/1979     Years since quitting: 38.4  . Smokeless tobacco: Never Used  Substance Use Topics  . Alcohol use: Yes    Comment: 11/23/2017 "beer once/month maybe"  . Drug use: Yes    Types: Marijuana    Comment: 11/23/2017 "nothing for years"    No Known Allergies  Current Outpatient Medications  Medication Sig Dispense Refill  . apixaban (ELIQUIS) 5 MG TABS tablet Take 1 tablet (5 mg total) by mouth 2 (two) times daily. Do not restart until seen in office for wound check appointment 60 tablet 6  . atorvastatin (LIPITOR) 40 MG tablet Take 40 mg by mouth every evening.     . clopidogrel (PLAVIX) 75 MG tablet Take 1 tablet (75 mg total) by mouth daily. 90 tablet 1  . digoxin (LANOXIN) 0.125 MG tablet TAKE 1 TABLET BY MOUTH ONCE DAILY (Patient taking differently: TAKE 0.125 MG BY MOUTH ONCE DAILY) 90 tablet 3  . ezetimibe (ZETIA) 10 MG tablet Take 1 tablet by mouth daily.    . furosemide (LASIX) 40 MG tablet Take 40 mg by mouth daily.    Marland Kitchen HYDROcodone-acetaminophen (NORCO/VICODIN) 5-325 MG tablet One tablet every four hours as needed for acute pain.  Limit of five days per [place]. 30 tablet 0  . KLOR-CON M10 10 MEQ tablet Take 10 mEq by mouth daily.  2  . lisinopril (PRINIVIL,ZESTRIL) 40 MG tablet Take 1 tablet (40 mg total) by mouth daily. 90 tablet 3  . losartan (COZAAR) 50 MG tablet   5  . metoprolol succinate (TOPROL-XL) 25 MG 24 hr tablet Take 12.5 mg by mouth every morning. & 50 mg by mouth in the evening     No current facility-administered medications for this visit.      Physical Exam  Blood pressure 94/64, pulse 62, height 5\' 8"  (1.727 m), weight 139 lb (63 kg).  Constitutional: overall normal hygiene, normal nutrition, well developed, normal grooming, normal body habitus. Assistive device:none  Musculoskeletal: gait and station Limp none, muscle tone and strength are normal, no tremors or atrophy is present.  .  Neurological: coordination overall normal.  Deep tendon  reflex/nerve stretch intact.  Sensation normal.  Cranial nerves II-XII intact.   Skin:   Normal overall no scars, lesions, ulcers or rashes. No psoriasis.  Psychiatric: Alert and oriented x 3.  Recent memory intact, remote memory unclear.  Normal mood and affect. Well groomed.  Good eye contact.  Cardiovascular: overall no swelling, no varicosities, no edema bilaterally, normal temperatures of the legs and arms, no clubbing, cyanosis and good capillary refill.  Lymphatic: palpation is normal.  Spine/Pelvis examination:  Inspection:  Overall, sacoiliac joint benign and hips nontender; without crepitus or defects.   Thoracic spine inspection: Alignment normal with kyphosis present   Lumbar spine inspection:  Alignment  with normal lumbar lordosis, without scoliosis apparent.   Thoracic spine palpation:  with tenderness of spinal processes   Lumbar spine palpation: with tenderness of lumbar area; with tightness of lumbar muscles    Range of Motion:   Lumbar  flexion, forward flexion is 45 without pain or tenderness    Lumbar extension is 10 without pain or tenderness   Left lateral bend is Normal  without pain or tenderness   Right lateral bend is Normal without pain or tenderness   Straight leg raising is Abnormal- at 30 degrees right   Strength & tone: Normal   Stability overall normal stability    All other systems reviewed and are negative   The patient has been educated about the nature of the problem(s) and counseled on treatment options.  The patient appeared to understand what I have discussed and is in agreement with it.  Encounter Diagnosis  Name Primary?  . Chronic midline low back pain with right-sided sciatica Yes    PLAN Call if any problems.  Precautions discussed.  Continue current medications.   Return to clinic to see neurosurgeon   When getting arrangements for the referral, the patient said he did not want to see anyone at present.  He said he did not  want any possibility of surgery.  No appointment was made per his request.   Electronically Signed Sanjuana Kava, MD 6/12/20191:43 PM

## 2018-04-11 NOTE — Patient Instructions (Signed)
..  A referral has been made for you to [facility].  They will call you to schedule the appointment.  If you do not hear from them within a week, please call [phone] and ask for their new patient coordinator.  She should be able to schedule you at that time. 

## 2018-04-12 LAB — CUP PACEART REMOTE DEVICE CHECK
Battery Remaining Percentage: 95.5 %
Battery Voltage: 3.01 V
Brady Statistic AP VP Percent: 0 %
Brady Statistic AS VS Percent: 0 %
Brady Statistic RA Percent Paced: 0 %
Brady Statistic RV Percent Paced: 25 %
Date Time Interrogation Session: 20190529080018
Implantable Lead Implant Date: 20190128
Implantable Lead Location: 753860
Implantable Pulse Generator Implant Date: 20190128
Lead Channel Impedance Value: 560 Ohm
Lead Channel Impedance Value: 560 Ohm
Lead Channel Pacing Threshold Pulse Width: 0.5 ms
Lead Channel Sensing Intrinsic Amplitude: 1.6 mV
MDC IDC LEAD IMPLANT DT: 20190128
MDC IDC LEAD LOCATION: 753859
MDC IDC MSMT BATTERY REMAINING LONGEVITY: 119 mo
MDC IDC MSMT LEADCHNL RV PACING THRESHOLD AMPLITUDE: 0.75 V
MDC IDC MSMT LEADCHNL RV SENSING INTR AMPL: 12 mV
MDC IDC SET LEADCHNL RA PACING AMPLITUDE: 2.5 V
MDC IDC SET LEADCHNL RV PACING AMPLITUDE: 2 V
MDC IDC SET LEADCHNL RV PACING PULSEWIDTH: 0.5 ms
MDC IDC SET LEADCHNL RV SENSING SENSITIVITY: 2 mV
MDC IDC STAT BRADY AP VS PERCENT: 0 %
MDC IDC STAT BRADY AS VP PERCENT: 0 %
Pulse Gen Model: 2272
Pulse Gen Serial Number: 8982069

## 2018-04-20 ENCOUNTER — Telehealth: Payer: Self-pay | Admitting: Orthopaedic Surgery

## 2018-04-20 NOTE — Telephone Encounter (Signed)
Patient came by office @closing   and stated that [facility] told them that they did not have an order for him. I saw in the referral that it had be canceled/closed. Patient came in last week and got phone number to call [facility].  Please call and advise

## 2018-04-24 NOTE — Telephone Encounter (Signed)
The patient came into the office before I could call him back and I faxed the referral to [facility].per Dr. Luna Glasgow.  He will expect their call for scheduling.

## 2018-05-02 ENCOUNTER — Telehealth: Payer: Self-pay | Admitting: Orthopaedic Surgery

## 2018-05-02 MED ORDER — HYDROCODONE-ACETAMINOPHEN 5-325 MG PO TABS: ORAL_TABLET | ORAL | 0 refills | Status: DC

## 2018-05-02 NOTE — Telephone Encounter (Signed)
Spoke with Dawn @ [facility] and she stated he has an appointment 05/16/18 @ 2 pm with Dr. Starlyn Skeans. I asked her if this was a rescheduled appointment and she said no.

## 2018-05-02 NOTE — Telephone Encounter (Signed)
[name] called stating he was in a lot of pain and was wondering if you would do another prescription of pain medication.  Hydrocodone-Acetaminophen 5/325 mg  Qty  30 Tablets  PATIENT USES  [facility]

## 2018-05-02 NOTE — Telephone Encounter (Signed)
He was supposed to have been seen yesterday by neurosurgeon.  Did he?  If so, they should give meds.

## 2018-05-16 DIAGNOSIS — M545 Low back pain: Secondary | ICD-10-CM | POA: Diagnosis not present

## 2018-05-16 DIAGNOSIS — M4696 Unspecified inflammatory spondylopathy, lumbar region: Secondary | ICD-10-CM | POA: Diagnosis not present

## 2018-05-16 DIAGNOSIS — G8929 Other chronic pain: Secondary | ICD-10-CM | POA: Diagnosis not present

## 2018-05-29 ENCOUNTER — Telehealth: Payer: Self-pay

## 2018-05-29 NOTE — Telephone Encounter (Signed)
Received fax from [facility] that patient was still getting losartan after it was discontinued at office visit on 01/17/18. Called patient patient is not taking losartan. Will remove off patient's list. Patient is aware that he is to be taking lisinopril 40 mg daily and not losartan.

## 2018-06-04 ENCOUNTER — Other Ambulatory Visit: Payer: Self-pay | Admitting: Nurse Practitioner

## 2018-06-08 DIAGNOSIS — I509 Heart failure, unspecified: Secondary | ICD-10-CM | POA: Diagnosis not present

## 2018-06-08 DIAGNOSIS — I482 Chronic atrial fibrillation: Secondary | ICD-10-CM | POA: Diagnosis not present

## 2018-06-08 DIAGNOSIS — J449 Chronic obstructive pulmonary disease, unspecified: Secondary | ICD-10-CM | POA: Diagnosis not present

## 2018-06-08 DIAGNOSIS — E782 Mixed hyperlipidemia: Secondary | ICD-10-CM | POA: Diagnosis not present

## 2018-06-08 DIAGNOSIS — R7301 Impaired fasting glucose: Secondary | ICD-10-CM | POA: Diagnosis not present

## 2018-06-08 DIAGNOSIS — R69 Illness, unspecified: Secondary | ICD-10-CM | POA: Diagnosis not present

## 2018-06-08 DIAGNOSIS — I5032 Chronic diastolic (congestive) heart failure: Secondary | ICD-10-CM | POA: Diagnosis not present

## 2018-06-08 DIAGNOSIS — I959 Hypotension, unspecified: Secondary | ICD-10-CM | POA: Diagnosis not present

## 2018-06-08 DIAGNOSIS — N183 Chronic kidney disease, stage 3 (moderate): Secondary | ICD-10-CM | POA: Diagnosis not present

## 2018-06-08 DIAGNOSIS — M1990 Unspecified osteoarthritis, unspecified site: Secondary | ICD-10-CM | POA: Diagnosis not present

## 2018-06-21 ENCOUNTER — Telehealth: Payer: Self-pay | Admitting: Orthopaedic Surgery

## 2018-06-21 NOTE — Telephone Encounter (Signed)
Patient's son called asking for a refill of pain medication. I asked if he had been to [facility], yet and he stated yes. I told him that Dr. Luna Glasgow referred him to [facility] and if he has been seen there, they are who he needs to contact about pain medication. He stated he understood.

## 2018-06-25 DIAGNOSIS — R69 Illness, unspecified: Secondary | ICD-10-CM | POA: Diagnosis not present

## 2018-06-25 DIAGNOSIS — M1990 Unspecified osteoarthritis, unspecified site: Secondary | ICD-10-CM | POA: Diagnosis not present

## 2018-06-25 DIAGNOSIS — E440 Moderate protein-calorie malnutrition: Secondary | ICD-10-CM | POA: Diagnosis not present

## 2018-06-25 DIAGNOSIS — R7301 Impaired fasting glucose: Secondary | ICD-10-CM | POA: Diagnosis not present

## 2018-06-25 DIAGNOSIS — N289 Disorder of kidney and ureter, unspecified: Secondary | ICD-10-CM | POA: Diagnosis not present

## 2018-06-25 DIAGNOSIS — I959 Hypotension, unspecified: Secondary | ICD-10-CM | POA: Diagnosis not present

## 2018-06-25 DIAGNOSIS — I5032 Chronic diastolic (congestive) heart failure: Secondary | ICD-10-CM | POA: Diagnosis not present

## 2018-06-25 DIAGNOSIS — J069 Acute upper respiratory infection, unspecified: Secondary | ICD-10-CM | POA: Diagnosis not present

## 2018-06-25 DIAGNOSIS — E782 Mixed hyperlipidemia: Secondary | ICD-10-CM | POA: Diagnosis not present

## 2018-06-25 DIAGNOSIS — N183 Chronic kidney disease, stage 3 (moderate): Secondary | ICD-10-CM | POA: Diagnosis not present

## 2018-06-26 ENCOUNTER — Ambulatory Visit (INDEPENDENT_AMBULATORY_CARE_PROVIDER_SITE_OTHER): Payer: Medicare HMO | Admitting: *Deleted

## 2018-06-26 DIAGNOSIS — I495 Sick sinus syndrome: Secondary | ICD-10-CM | POA: Diagnosis not present

## 2018-06-26 NOTE — Progress Notes (Signed)
Remote pacemaker transmission.   

## 2018-06-28 ENCOUNTER — Encounter: Payer: Self-pay | Admitting: Cardiology

## 2018-06-28 DIAGNOSIS — Z6822 Body mass index (BMI) 22.0-22.9, adult: Secondary | ICD-10-CM | POA: Diagnosis not present

## 2018-06-28 DIAGNOSIS — Z950 Presence of cardiac pacemaker: Secondary | ICD-10-CM | POA: Diagnosis not present

## 2018-06-28 DIAGNOSIS — N183 Chronic kidney disease, stage 3 (moderate): Secondary | ICD-10-CM | POA: Diagnosis not present

## 2018-06-28 DIAGNOSIS — R7301 Impaired fasting glucose: Secondary | ICD-10-CM | POA: Diagnosis not present

## 2018-06-28 DIAGNOSIS — E782 Mixed hyperlipidemia: Secondary | ICD-10-CM | POA: Diagnosis not present

## 2018-06-28 DIAGNOSIS — I482 Chronic atrial fibrillation: Secondary | ICD-10-CM | POA: Diagnosis not present

## 2018-06-28 DIAGNOSIS — I5032 Chronic diastolic (congestive) heart failure: Secondary | ICD-10-CM | POA: Diagnosis not present

## 2018-06-28 DIAGNOSIS — E875 Hyperkalemia: Secondary | ICD-10-CM | POA: Diagnosis not present

## 2018-07-03 ENCOUNTER — Encounter: Payer: Self-pay | Admitting: Cardiovascular Disease

## 2018-07-11 DIAGNOSIS — I509 Heart failure, unspecified: Secondary | ICD-10-CM | POA: Diagnosis not present

## 2018-07-11 DIAGNOSIS — M1990 Unspecified osteoarthritis, unspecified site: Secondary | ICD-10-CM | POA: Diagnosis not present

## 2018-07-11 DIAGNOSIS — N183 Chronic kidney disease, stage 3 (moderate): Secondary | ICD-10-CM | POA: Diagnosis not present

## 2018-07-11 DIAGNOSIS — E782 Mixed hyperlipidemia: Secondary | ICD-10-CM | POA: Diagnosis not present

## 2018-07-11 DIAGNOSIS — I5032 Chronic diastolic (congestive) heart failure: Secondary | ICD-10-CM | POA: Diagnosis not present

## 2018-07-11 DIAGNOSIS — R7301 Impaired fasting glucose: Secondary | ICD-10-CM | POA: Diagnosis not present

## 2018-07-11 DIAGNOSIS — J449 Chronic obstructive pulmonary disease, unspecified: Secondary | ICD-10-CM | POA: Diagnosis not present

## 2018-07-11 DIAGNOSIS — I959 Hypotension, unspecified: Secondary | ICD-10-CM | POA: Diagnosis not present

## 2018-07-11 DIAGNOSIS — R69 Illness, unspecified: Secondary | ICD-10-CM | POA: Diagnosis not present

## 2018-07-11 DIAGNOSIS — I482 Chronic atrial fibrillation: Secondary | ICD-10-CM | POA: Diagnosis not present

## 2018-07-12 ENCOUNTER — Other Ambulatory Visit: Payer: Self-pay | Admitting: Cardiovascular Disease

## 2018-07-17 ENCOUNTER — Telehealth: Payer: Self-pay | Admitting: Internal Medicine

## 2018-07-17 NOTE — Telephone Encounter (Signed)
Per pt's wife-- he's sluggish, has no energy and she feels like he needs to be seen and doesn't feel like his pacemaker is working right.

## 2018-07-17 NOTE — Telephone Encounter (Signed)
Returned pt call. No answer, left message for pt to return call. Made appointment for 9/20 @ 11:45 to see Dr. Lovena Le at [facility] office.

## 2018-07-18 NOTE — Telephone Encounter (Signed)
Called pt. No answer. Left message for pt to return call.  

## 2018-07-19 NOTE — Telephone Encounter (Signed)
Called pt. No answer, no voicemail

## 2018-07-20 ENCOUNTER — Ambulatory Visit (INDEPENDENT_AMBULATORY_CARE_PROVIDER_SITE_OTHER): Payer: Medicare HMO | Admitting: Internal Medicine

## 2018-07-20 ENCOUNTER — Encounter: Payer: Self-pay | Admitting: Internal Medicine

## 2018-07-20 VITALS — BP 102/60 | HR 66 | Ht 68.0 in | Wt 140.0 lb

## 2018-07-20 DIAGNOSIS — I495 Sick sinus syndrome: Secondary | ICD-10-CM | POA: Diagnosis not present

## 2018-07-20 DIAGNOSIS — E559 Vitamin D deficiency, unspecified: Secondary | ICD-10-CM | POA: Diagnosis not present

## 2018-07-20 DIAGNOSIS — Z950 Presence of cardiac pacemaker: Secondary | ICD-10-CM | POA: Diagnosis not present

## 2018-07-20 DIAGNOSIS — I10 Essential (primary) hypertension: Secondary | ICD-10-CM | POA: Diagnosis not present

## 2018-07-20 DIAGNOSIS — I4819 Other persistent atrial fibrillation: Secondary | ICD-10-CM

## 2018-07-20 DIAGNOSIS — R809 Proteinuria, unspecified: Secondary | ICD-10-CM | POA: Diagnosis not present

## 2018-07-20 DIAGNOSIS — I481 Persistent atrial fibrillation: Secondary | ICD-10-CM

## 2018-07-20 DIAGNOSIS — D509 Iron deficiency anemia, unspecified: Secondary | ICD-10-CM | POA: Diagnosis not present

## 2018-07-20 DIAGNOSIS — N183 Chronic kidney disease, stage 3 (moderate): Secondary | ICD-10-CM | POA: Diagnosis not present

## 2018-07-20 DIAGNOSIS — Z79899 Other long term (current) drug therapy: Secondary | ICD-10-CM | POA: Diagnosis not present

## 2018-07-20 NOTE — Progress Notes (Signed)
HPI Ricardo Garrett returns today for followup. He is a pleasant 72 yo man with a h/o sinus node dysfunction and heart block, s/p PPM insertion. He is frustrated about his inability to do what he wants to do. He does not feel his atrial fib. He is limited mainly by pain in his leg due to prior procedures.  No Known Allergies   Current Outpatient Medications  Medication Sig Dispense Refill  . apixaban (ELIQUIS) 5 MG TABS tablet Take 1 tablet (5 mg total) by mouth 2 (two) times daily. Do not restart until seen in office for wound check appointment 60 tablet 6  . atorvastatin (LIPITOR) 80 MG tablet Take 1 tablet by mouth daily.  3  . clopidogrel (PLAVIX) 75 MG tablet TAKE 1 TABLET BY MOUTH ONCE DAILY 90 tablet 2  . digoxin (LANOXIN) 0.125 MG tablet TAKE 1 TABLET BY MOUTH ONCE DAILY (Patient taking differently: TAKE 0.125 MG BY MOUTH ONCE DAILY) 90 tablet 3  . ezetimibe (ZETIA) 10 MG tablet Take 1 tablet by mouth daily.    . furosemide (LASIX) 40 MG tablet Take 40 mg by mouth daily.    Marland Kitchen HYDROcodone-acetaminophen (NORCO/VICODIN) 5-325 MG tablet One tablet by mouth every six hours as needed for pain. 56 tablet 0  . KLOR-CON M10 10 MEQ tablet Take 10 mEq by mouth daily.  2  . losartan (COZAAR) 50 MG tablet Take 1 tablet by mouth daily.    . metoprolol succinate (TOPROL-XL) 25 MG 24 hr tablet TAKE 1/2 (ONE-HALF) TABLET BY MOUTH IN THE MORNING AND 1 TABLET IN THE EVENING 135 tablet 1  . lisinopril (PRINIVIL,ZESTRIL) 40 MG tablet Take 1 tablet (40 mg total) by mouth daily. 90 tablet 3   No current facility-administered medications for this visit.      Past Medical History:  Diagnosis Date  . Arthritis    "finger on right hand" (11/23/2017)  . Broken arm ~ 1952   "no OR; just reset it; not sure which side"  . CAD (coronary artery disease)   . CHF (congestive heart failure) ([place])    a. 12/2015: echo showing reduced EF of 35-40% b. NST: 09/2017: scar with peri-infarct ischemia,  intermediate-risk study  . COPD (chronic obstructive pulmonary disease) ([place])    "was on RX when he had CHF; not on anything now" (11/23/2017)  . High cholesterol   . HOH (hard of hearing)   . Hypertension   . Persistent atrial fibrillation ([place])    a. s/p DCCV in 03/2016    ROS:   All systems reviewed and negative except as noted in the HPI.   Past Surgical History:  Procedure Laterality Date  . CARDIAC CATHETERIZATION  11/23/2017  . CARDIOVERSION N/A 04/05/2016   Procedure: CARDIOVERSION;  Surgeon: Josue Hector, MD;  [facility];  Service: Cardiovascular;  Laterality: N/A;  . COMPRESSION HIP SCREW Right 03/15/2013   Procedure: COMPRESSION HIP;  Surgeon: Sanjuana Kava, MD;  Location: [facility];  Service: Orthopedics;  Laterality: Right;  . CORONARY ATHERECTOMY N/A 11/24/2017   Procedure: CORONARY ATHERECTOMY;  Surgeon: Burnell Blanks, MD;  Location: [place] [facility];  Service: Cardiovascular;  Laterality: N/A;  . CORONARY BALLOON ANGIOPLASTY N/A 11/24/2017   Procedure: CORONARY BALLOON ANGIOPLASTY;  Surgeon: Burnell Blanks, MD;  Location: [place] [facility];  Service: Cardiovascular;  Laterality: N/A;  . CORONARY STENT INTERVENTION N/A 11/24/2017   Procedure: CORONARY STENT INTERVENTION;  Surgeon: Burnell Blanks, MD;  Location:  [place] [facility];  Service: Cardiovascular;  Laterality: N/A;  . FRACTURE SURGERY    . PACEMAKER IMPLANT N/A 11/27/2017   Procedure: PACEMAKER IMPLANT;  Surgeon: Evans Lance, MD;  Location: [place] [facility];  Service: Cardiovascular;  Laterality: N/A;  . RIGHT/LEFT HEART CATH AND CORONARY ANGIOGRAPHY N/A 11/23/2017   Procedure: RIGHT/LEFT HEART CATH AND CORONARY ANGIOGRAPHY;  Surgeon: Burnell Blanks, MD;  Location: [place] [facility];  Service: Cardiovascular;  Laterality: N/A;  . SKIN GRAFT Left    from right thigh  . TONSILLECTOMY       Family History  Problem Relation Age of Onset  . Diabetes Mother     . Heart attack Father      Social History   Socioeconomic History  . Marital status: Married    Spouse name: Not on file  . Number of children: Not on file  . Years of education: Not on file  . Highest education level: Not on file  Occupational History  . Not on file  Social Needs  . Financial resource strain: Not on file  . Food insecurity:    Worry: Not on file    Inability: Not on file  . Transportation needs:    Medical: Not on file    Non-medical: Not on file  Tobacco Use  . Smoking status: Former Smoker    Packs/day: 1.00    Years: 20.00    Pack years: 20.00    Types: Cigarettes    Last attempt to quit: 11/01/1979    Years since quitting: 38.7  . Smokeless tobacco: Never Used  Substance and Sexual Activity  . Alcohol use: Yes    Comment: 11/23/2017 "beer once/month maybe"  . Drug use: Yes    Types: Marijuana    Comment: 11/23/2017 "nothing for years"  . Sexual activity: Yes    Birth control/protection: None  Lifestyle  . Physical activity:    Days per week: Not on file    Minutes per session: Not on file  . Stress: Not on file  Relationships  . Social connections:    Talks on phone: Not on file    Gets together: Not on file    Attends religious service: Not on file    Active member of club or organization: Not on file    Attends meetings of clubs or organizations: Not on file    Relationship status: Not on file  . Intimate partner violence:    Fear of current or ex partner: Not on file    Emotionally abused: Not on file    Physically abused: Not on file    Forced sexual activity: Not on file  Other Topics Concern  . Not on file  Social History Narrative  . Not on file     BP 102/60   Pulse 66   Ht 5\' 8"  (1.727 m)   Wt 140 lb (63.5 kg)   SpO2 97%   BMI 21.29 kg/m   Physical Exam:  Well appearing NAD HEENT: Unremarkable Neck:  No JVD, no thyromegally Lymphatics:  No adenopathy Back:  No CVA tenderness Lungs:  Clear with no  wheezes HEART:  Regular rate rhythm, no murmurs, no rubs, no clicks Abd:  soft, positive bowel sounds, no organomegally, no rebound, no guarding Ext:  2 plus pulses, no edema, no cyanosis, no clubbing Skin:  No rashes no nodules Neuro:  CN II through XII intact, motor grossly intact  EKG - atrial fib with a controlled  VR  DEVICE  Normal device function.  See PaceArt for details.   Assess/Plan: 1. Atrial fib - his ventricular rates are well controlled.  2. HTN - His blood pressure is well controlled. We will followed but no change in his meds. 3. Chronic systolic heart failure - his symptoms are class 2. He will continue his current meds. 4. PPM - his [employer] DDD PM is working normally.   Mikle Bosworth.D.

## 2018-07-20 NOTE — Patient Instructions (Signed)
Medication Instructions:  Your physician recommends that you continue on your current medications as directed. Please refer to the Current Medication list given to you today.  Labwork: None ordered.  Testing/Procedures: None ordered.  Follow-Up: Your physician wants you to follow-up in: one year with Dr. Lovena Le.   You will receive a reminder letter in the mail two months in advance. If you don't receive a letter, please call our office to schedule the follow-up appointment.  Remote monitoring is used to monitor your Pacemaker from home. This monitoring reduces the number of office visits required to check your device to one time per year. It allows [place] to keep an eye on the functioning of your device to ensure it is working properly. You are scheduled for a device check from home on 09/25/2018. You may send your transmission at any time that day. If you have a wireless device, the transmission will be sent automatically. After your physician reviews your transmission, you will receive a postcard with your next transmission date.  Any Other Special Instructions Will Be Listed Below (If Applicable).  If you need a refill on your cardiac medications before your next appointment, please call your pharmacy.

## 2018-07-20 NOTE — Telephone Encounter (Signed)
Patient saw Dr.taylor in [place] today

## 2018-07-21 DIAGNOSIS — R69 Illness, unspecified: Secondary | ICD-10-CM | POA: Diagnosis not present

## 2018-07-25 LAB — CUP PACEART REMOTE DEVICE CHECK
Battery Remaining Longevity: 116 mo
Battery Remaining Percentage: 95.5 %
Brady Statistic RA Percent Paced: 0 %
Brady Statistic RV Percent Paced: 29 %
Implantable Lead Implant Date: 20190128
Implantable Pulse Generator Implant Date: 20190128
Lead Channel Pacing Threshold Amplitude: 0.75 V
Lead Channel Sensing Intrinsic Amplitude: 12 mV
Lead Channel Sensing Intrinsic Amplitude: 2.4 mV
Lead Channel Setting Pacing Amplitude: 2 V
Lead Channel Setting Sensing Sensitivity: 2 mV
MDC IDC LEAD IMPLANT DT: 20190128
MDC IDC LEAD LOCATION: 753859
MDC IDC LEAD LOCATION: 753860
MDC IDC MSMT BATTERY VOLTAGE: 3.01 V
MDC IDC MSMT LEADCHNL RA IMPEDANCE VALUE: 580 Ohm
MDC IDC MSMT LEADCHNL RV IMPEDANCE VALUE: 560 Ohm
MDC IDC MSMT LEADCHNL RV PACING THRESHOLD PULSEWIDTH: 0.5 ms
MDC IDC PG SERIAL: 8982069
MDC IDC SESS DTM: 20190826073223
MDC IDC SET LEADCHNL RA PACING AMPLITUDE: 2.5 V
MDC IDC SET LEADCHNL RV PACING PULSEWIDTH: 0.5 ms
MDC IDC STAT BRADY AP VP PERCENT: 0 %
MDC IDC STAT BRADY AP VS PERCENT: 0 %
MDC IDC STAT BRADY AS VP PERCENT: 100 %
MDC IDC STAT BRADY AS VS PERCENT: 0 %
Pulse Gen Model: 2272

## 2018-08-01 DIAGNOSIS — Z6821 Body mass index (BMI) 21.0-21.9, adult: Secondary | ICD-10-CM | POA: Diagnosis not present

## 2018-08-01 DIAGNOSIS — Z7189 Other specified counseling: Secondary | ICD-10-CM | POA: Diagnosis not present

## 2018-08-02 DIAGNOSIS — Z7901 Long term (current) use of anticoagulants: Secondary | ICD-10-CM | POA: Diagnosis not present

## 2018-08-02 DIAGNOSIS — I5032 Chronic diastolic (congestive) heart failure: Secondary | ICD-10-CM | POA: Diagnosis not present

## 2018-08-02 DIAGNOSIS — J449 Chronic obstructive pulmonary disease, unspecified: Secondary | ICD-10-CM | POA: Diagnosis not present

## 2018-08-02 DIAGNOSIS — D692 Other nonthrombocytopenic purpura: Secondary | ICD-10-CM | POA: Diagnosis not present

## 2018-08-02 DIAGNOSIS — E782 Mixed hyperlipidemia: Secondary | ICD-10-CM | POA: Diagnosis not present

## 2018-08-02 DIAGNOSIS — I482 Chronic atrial fibrillation, unspecified: Secondary | ICD-10-CM | POA: Diagnosis not present

## 2018-08-02 DIAGNOSIS — N183 Chronic kidney disease, stage 3 (moderate): Secondary | ICD-10-CM | POA: Diagnosis not present

## 2018-08-02 DIAGNOSIS — I509 Heart failure, unspecified: Secondary | ICD-10-CM | POA: Diagnosis not present

## 2018-08-02 DIAGNOSIS — Z6821 Body mass index (BMI) 21.0-21.9, adult: Secondary | ICD-10-CM | POA: Diagnosis not present

## 2018-08-02 DIAGNOSIS — R69 Illness, unspecified: Secondary | ICD-10-CM | POA: Diagnosis not present

## 2018-08-16 ENCOUNTER — Ambulatory Visit: Payer: Medicare HMO | Admitting: Cardiovascular Disease

## 2018-08-22 DIAGNOSIS — R7301 Impaired fasting glucose: Secondary | ICD-10-CM | POA: Diagnosis not present

## 2018-08-22 DIAGNOSIS — E440 Moderate protein-calorie malnutrition: Secondary | ICD-10-CM | POA: Diagnosis not present

## 2018-08-22 DIAGNOSIS — J449 Chronic obstructive pulmonary disease, unspecified: Secondary | ICD-10-CM | POA: Diagnosis not present

## 2018-08-22 DIAGNOSIS — R5383 Other fatigue: Secondary | ICD-10-CM | POA: Diagnosis not present

## 2018-08-22 DIAGNOSIS — I482 Chronic atrial fibrillation, unspecified: Secondary | ICD-10-CM | POA: Diagnosis not present

## 2018-08-22 DIAGNOSIS — I509 Heart failure, unspecified: Secondary | ICD-10-CM | POA: Diagnosis not present

## 2018-08-22 DIAGNOSIS — Z130 Encounter for screening for diseases of the blood and blood-forming organs and certain disorders involving the immune mechanism: Secondary | ICD-10-CM | POA: Diagnosis not present

## 2018-08-22 DIAGNOSIS — I959 Hypotension, unspecified: Secondary | ICD-10-CM | POA: Diagnosis not present

## 2018-08-22 DIAGNOSIS — N402 Nodular prostate without lower urinary tract symptoms: Secondary | ICD-10-CM | POA: Diagnosis not present

## 2018-08-22 DIAGNOSIS — I5032 Chronic diastolic (congestive) heart failure: Secondary | ICD-10-CM | POA: Diagnosis not present

## 2018-08-22 DIAGNOSIS — E782 Mixed hyperlipidemia: Secondary | ICD-10-CM | POA: Diagnosis not present

## 2018-08-22 DIAGNOSIS — M1990 Unspecified osteoarthritis, unspecified site: Secondary | ICD-10-CM | POA: Diagnosis not present

## 2018-08-22 DIAGNOSIS — N183 Chronic kidney disease, stage 3 (moderate): Secondary | ICD-10-CM | POA: Diagnosis not present

## 2018-08-22 DIAGNOSIS — Z125 Encounter for screening for malignant neoplasm of prostate: Secondary | ICD-10-CM | POA: Diagnosis not present

## 2018-09-06 IMAGING — MR MR LUMBAR SPINE W/O CM
4 of 5 series · 26 of 48 positions shown · non-contrast
Comparison: None.

CLINICAL DATA: Chronic low back pain, now with RIGHT hip pain.

EXAM:
MRI LUMBAR SPINE WITHOUT CONTRAST
TECHNIQUE: Multiplanar, multisequence MR imaging of the lumbar spine was
performed. No intravenous contrast was administered.

[Series 5: T2 · sagittal · 4.0mm · 0.73mm/px · 6 of 16 slices shown (1 of 2)]
[im 1/16]
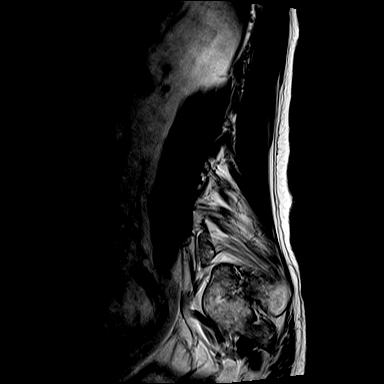
[im 4/16]
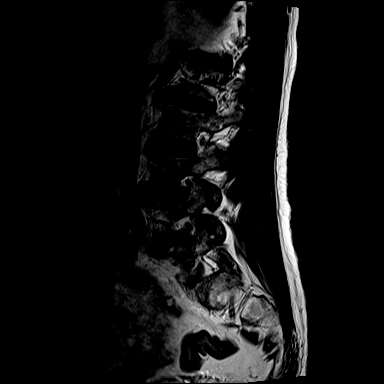
[im 7/16]
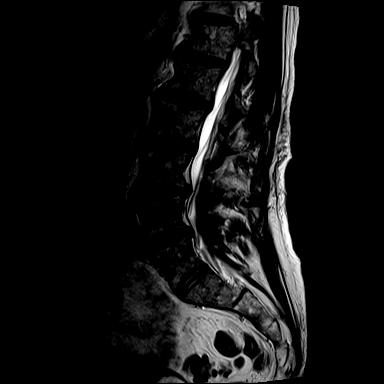
[im 10/16]
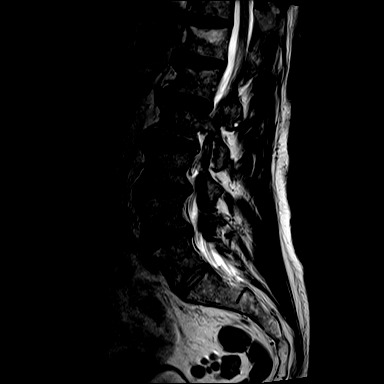
[im 13/16]
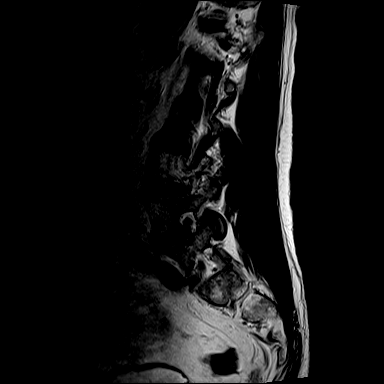
[im 16/16]
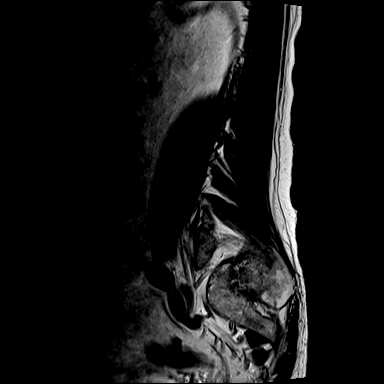

[Series 7: T1 · sagittal · 4.0mm · 0.88mm/px · 6 of 16 slices shown (1 of 2)]
[im 1/16]
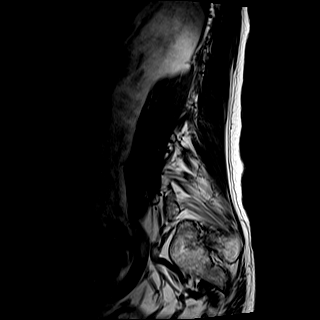
[im 4/16]
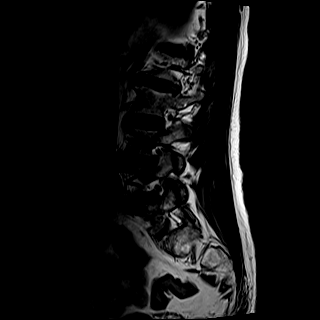
[im 7/16]
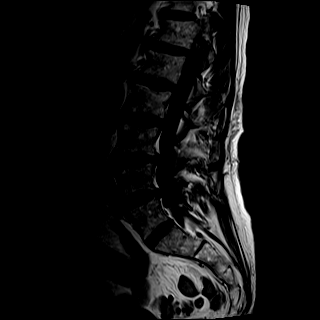
[im 10/16]
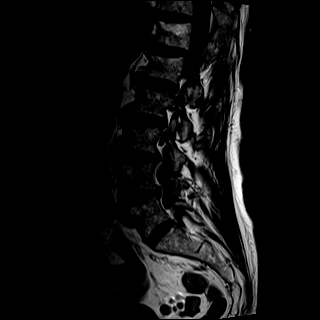
[im 13/16]
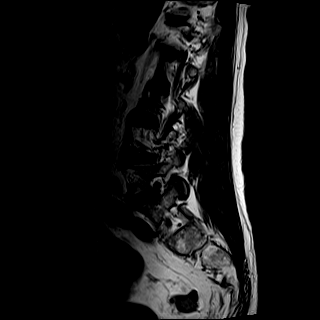
[im 16/16]
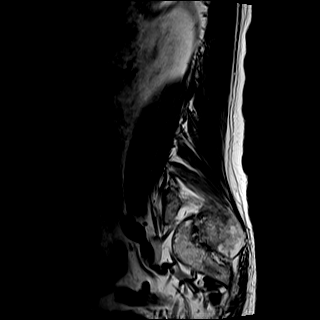

[Series 8: T1 · axial · 4.0mm · 0.34mm/px · z∈[-79,+124]mm · 5 of 37 slices shown (2 of 2)]
[im 1/37]
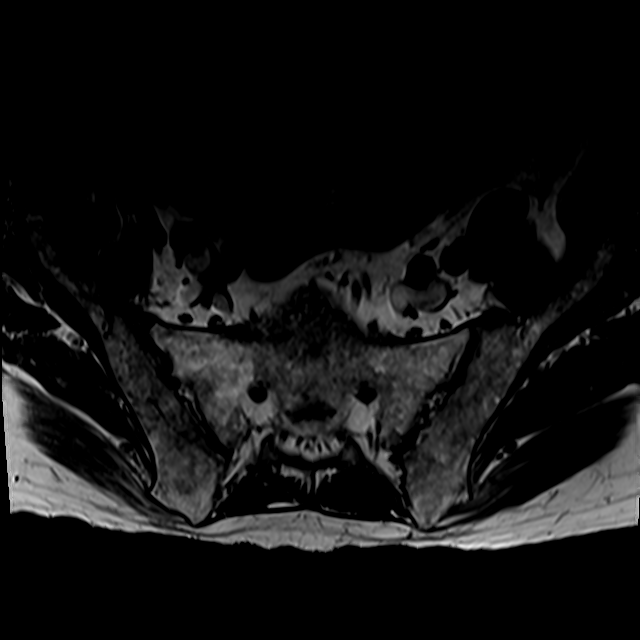
[im 6/37]
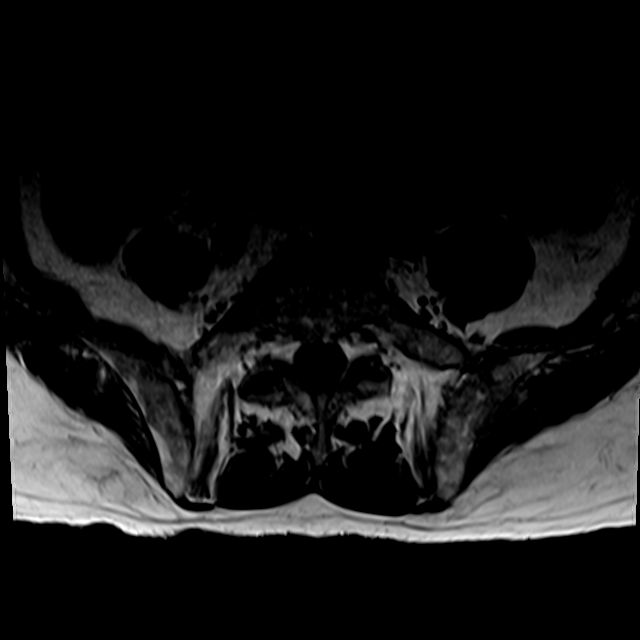
[im 11/37]
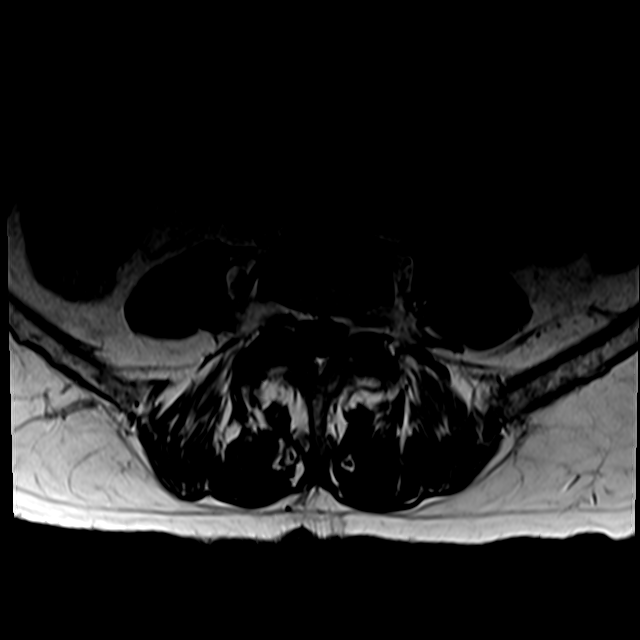
[im 19/37]
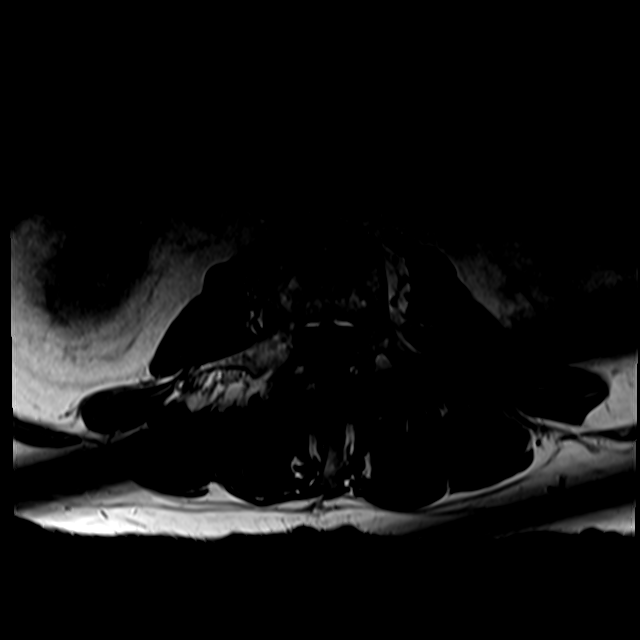
[im 31/37]
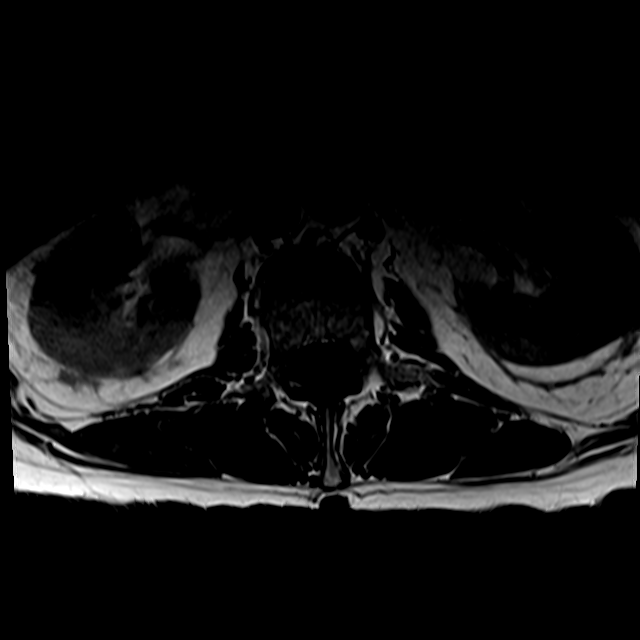

[Series 9: T2 · axial · 4.0mm · 0.57mm/px · z∈[-79,+153]mm · 9 of 37 slices shown (2 of 2)]
[im 1/37]
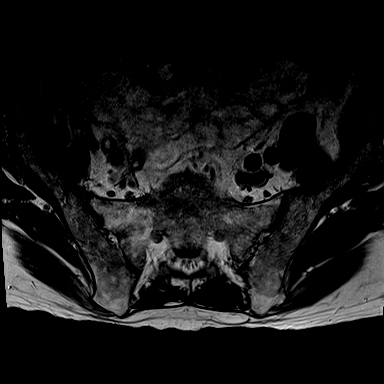
[im 6/37]
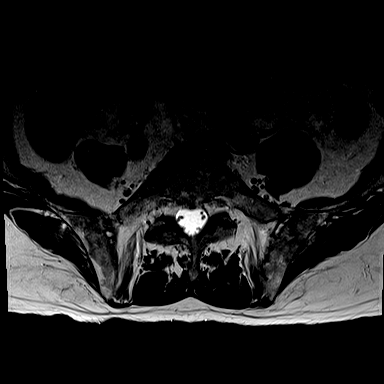
[im 11/37]
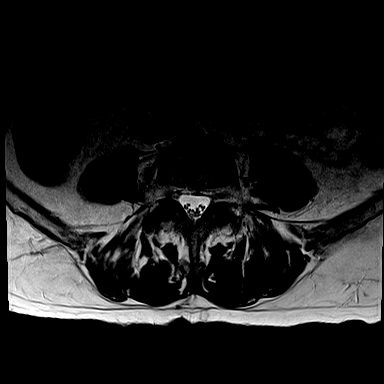
[im 16/37]
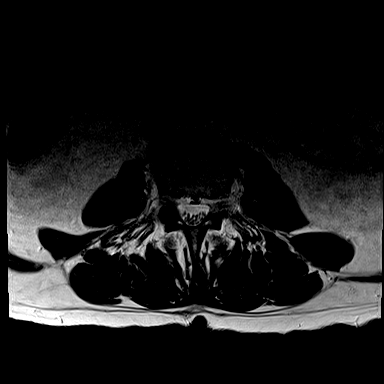
[im 19/37]
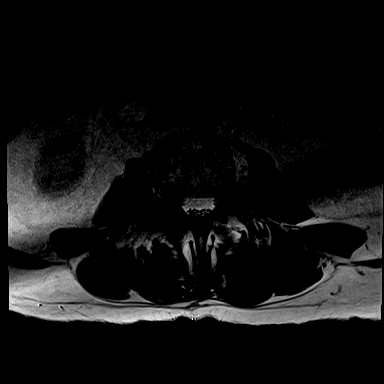
[im 21/37]
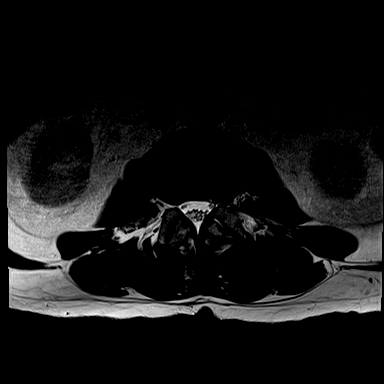
[im 26/37]
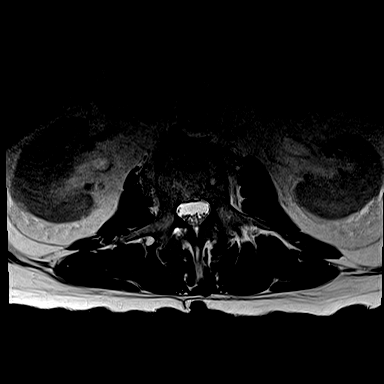
[im 31/37]
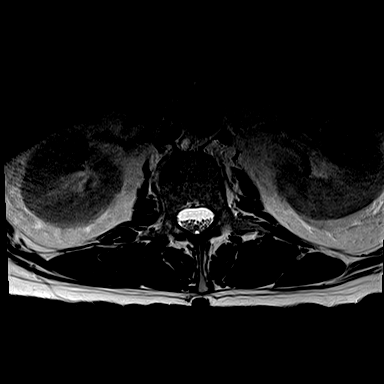
[im 37/37]
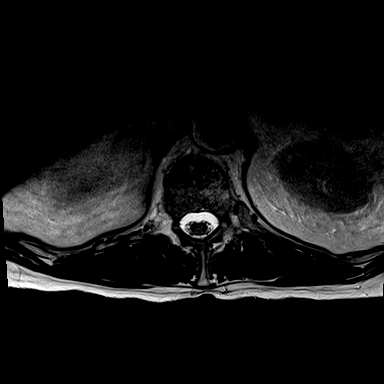

[26 of 48 positions shown; findings below may reference images not displayed]

FINDINGS: The patient was unable to remain motionless for the exam. Small or
subtle lesions could be overlooked.

Segmentation:  Standard.

Alignment: Degenerative scoliosis convex RIGHT in the upper lumbar
region.

Vertebrae:  No worrisome osseous lesion.

Conus medullaris and cauda equina: Conus extends to the L1 level.
Conus and cauda equina appear normal.

Paraspinal and other soft tissues: Unremarkable.

Disc levels:

L1-L2:  Annular rent.  Shallow protrusion.  No impingement.

L2-L3: Central bulge. Annular rent on the LEFT. LEFT L2 and LEFT L3
nerve root irritation or possible.

L3-L4: Fairly good disc height and signal. Annular rent. Shallow
central protrusion. Osseous spurring. Facet arthropathy. Mild
stenosis. Borderline subarticular zone and foraminal zone narrowing
affecting the L3 and L4 nerve roots.

L4-L5: Fairly good disc height and signal. Central protrusion. Facet
arthropathy. Disc material extends more to the RIGHT than LEFT no
definite subarticular zone or foraminal zone narrowing.

L5-S1: Normal-appearing disc space. RIGHT greater than LEFT facet
arthropathy. No impingement.
IMPRESSION: Motion degraded examination demonstrating mild multilevel disc
pathology, but a dominant RIGHT-sided compressive lesion is not
clearly identified.

Asymmetric facet arthropathy at L5-S1 on the RIGHT. Correlate
clinically for facet mediated lumbago.

It is also possible the L3-4 level is symptomatic, with a shallow
central protrusion, and facet arthropathy contributing to mild
stenosis.

## 2018-09-18 NOTE — Progress Notes (Signed)
Cardiology Office Note    Date:  09/21/2018   ID:  Ricardo Garrett, DOB 02/23/1946, MRN [id number]  PCP:  [name], MD  Cardiologist: Dr. Johnsie Cancel Dr Lovena Le  No chief complaint on file.   History of Present Illness:    72 y.o. CHF EF 35-40%, PAF, HTN, HLD Had abnormal stress test and set up for cath 11/23/17 with orbital atherectomy and stenting of mid circumflex with DES and PCI small OM branch done by Dr Veronda Prude. Plan for DAT month then Eliquis and Plavix. 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 Wife indicates he got drunk and smoked pot and fell/ passed out Can't afford PT/OT that helped him before Frustrated with LE pain and inability to do things MRI June 2019 suggesting L5-S1 and L34 disease could cause lumbago  Lost a letter Dr Lovena Le gave him regarding not putting smart meters into his house due to his pacer Seeing [facility] Monday and I will let him know he needs a notarized letter indicating this    Past Medical History:  Diagnosis Date  . Arthritis    "finger on right hand" (11/23/2017)  . Broken arm ~ 1952   "no OR; just reset it; not sure which side"  . CAD (coronary artery disease)   . CHF (congestive heart failure) ([name])    a. 12/2015: echo showing reduced EF of 35-40% b. NST: 09/2017: scar with peri-infarct ischemia, intermediate-risk study  . COPD (chronic obstructive pulmonary disease) ([place])    "was on RX when he had CHF; not on anything now" (11/23/2017)  . High cholesterol   . HOH (hard of hearing)   . Hypertension   . Persistent atrial fibrillation    a. s/p DCCV in 03/2016    Past Surgical History:  Procedure Laterality Date  . CARDIAC CATHETERIZATION  11/23/2017  . CARDIOVERSION N/A 04/05/2016   Procedure: CARDIOVERSION;  Surgeon: Josue Hector, MD;  [facility];  Service:  Cardiovascular;  Laterality: N/A;  . COMPRESSION HIP SCREW Right 03/15/2013   Procedure: COMPRESSION HIP;  Surgeon: Sanjuana Kava, MD;  Location: [facility];  Service: Orthopedics;  Laterality: Right;  . CORONARY ATHERECTOMY N/A 11/24/2017   Procedure: CORONARY ATHERECTOMY;  Surgeon: Burnell Blanks, MD;  Location: [place] [facility];  Service: Cardiovascular;  Laterality: N/A;  . CORONARY BALLOON ANGIOPLASTY N/A 11/24/2017   Procedure: CORONARY BALLOON ANGIOPLASTY;  Surgeon: Burnell Blanks, MD;  Location: [place] [facility];  Service: Cardiovascular;  Laterality: N/A;  . CORONARY STENT INTERVENTION N/A 11/24/2017   Procedure: CORONARY STENT INTERVENTION;  Surgeon: Burnell Blanks, MD;  Location: [place] [facility];  Service: Cardiovascular;  Laterality: N/A;  . FRACTURE SURGERY    . PACEMAKER IMPLANT N/A 11/27/2017   Procedure: PACEMAKER IMPLANT;  Surgeon: Evans Lance, MD;  Location: [place] [facility];  Service: Cardiovascular;  Laterality: N/A;  . RIGHT/LEFT HEART CATH AND CORONARY ANGIOGRAPHY N/A 11/23/2017   Procedure: RIGHT/LEFT HEART CATH AND CORONARY ANGIOGRAPHY;  Surgeon: Burnell Blanks, MD;  Location: [place] [facility];  Service: Cardiovascular;  Laterality: N/A;  . SKIN GRAFT Left    from right thigh  . TONSILLECTOMY      Current Medications: Outpatient Medications Prior to Visit  Medication Sig Dispense Refill  . apixaban (ELIQUIS) 5 MG  TABS tablet Take 1 tablet (5 mg total) by mouth 2 (two) times daily. Do not restart until seen in office for wound check appointment 60 tablet 6  . atorvastatin (LIPITOR) 80 MG tablet Take 1 tablet by mouth daily.  3  . clopidogrel (PLAVIX) 75 MG tablet TAKE 1 TABLET BY MOUTH ONCE DAILY 90 tablet 2  . digoxin (LANOXIN) 0.125 MG tablet TAKE 1 TABLET BY MOUTH ONCE DAILY (Patient taking differently: TAKE 0.125 MG BY MOUTH ONCE DAILY) 90 tablet 3  . ezetimibe (ZETIA) 10 MG tablet Take 1 tablet by mouth daily.    .  furosemide (LASIX) 40 MG tablet Take 40 mg by mouth daily.    Marland Kitchen HYDROcodone-acetaminophen (NORCO/VICODIN) 5-325 MG tablet One tablet by mouth every six hours as needed for pain. 56 tablet 0  . KLOR-CON M10 10 MEQ tablet Take 10 mEq by mouth daily.  2  . lisinopril (PRINIVIL,ZESTRIL) 40 MG tablet Take 1 tablet (40 mg total) by mouth daily. 90 tablet 3  . losartan (COZAAR) 50 MG tablet Take 1 tablet by mouth daily.    . metoprolol succinate (TOPROL-XL) 25 MG 24 hr tablet TAKE 1/2 (ONE-HALF) TABLET BY MOUTH IN THE MORNING AND 1 TABLET IN THE EVENING 135 tablet 1   No facility-administered medications prior to visit.      Allergies:   Patient has no known allergies.   Social History   Socioeconomic History  . Marital status: Married    Spouse name: Not on file  . Number of children: Not on file  . Years of education: Not on file  . Highest education level: Not on file  Occupational History  . Not on file  Social Needs  . Financial resource strain: Not on file  . Food insecurity:    Worry: Not on file    Inability: Not on file  . Transportation needs:    Medical: Not on file    Non-medical: Not on file  Tobacco Use  . Smoking status: Former Smoker    Packs/day: 1.00    Years: 20.00    Pack years: 20.00    Types: Cigarettes    Last attempt to quit: 11/01/1979    Years since quitting: 38.9  . Smokeless tobacco: Never Used  Substance and Sexual Activity  . Alcohol use: Yes    Comment: 11/23/2017 "beer once/month maybe"  . Drug use: Yes    Types: Marijuana    Comment: 11/23/2017 "nothing for years"  . Sexual activity: Yes    Birth control/protection: None  Lifestyle  . Physical activity:    Days per week: Not on file    Minutes per session: Not on file  . Stress: Not on file  Relationships  . Social connections:    Talks on phone: Not on file    Gets together: Not on file    Attends religious service: Not on file    Active member of club or organization: Not on file     Attends meetings of clubs or organizations: Not on file    Relationship status: Not on file  Other Topics Concern  . Not on file  Social History Narrative  . Not on file     Family History:  The patient's family history includes Diabetes in his mother; Heart attack in his father.   Review of Systems:   Please see the history of present illness.     General:  No chills, fever, night sweats or weight changes. Positive for fatigue.  Cardiovascular:  No dyspnea on exertion, edema, orthopnea, palpitations, paroxysmal nocturnal dyspnea. Positive for chest pain.  Dermatological: No rash, lesions/masses Respiratory: No cough, dyspnea Urologic: No hematuria, dysuria Abdominal:   No nausea, vomiting, diarrhea, bright red blood per rectum, melena, or hematemesis Neurologic:  No visual changes, wkns, changes in mental status. All other systems reviewed and are otherwise negative except as noted above.   Physical Exam:    VS:  BP 108/60   Pulse 62   Ht 5\' 8"  (1.727 m)   Wt 145 lb (65.8 kg)   SpO2 98%   BMI 22.05 kg/m    Affect appropriate Healthy:  appears stated age 87: normal Neck supple with no adenopathy JVP normal no bruits no thyromegaly Lungs clear with no wheezing and good diaphragmatic motion Heart:  S1/S2 no murmur, no rub, gallop or click PMI normal pacer under left clavicle  Abdomen: benighn, BS positve, no tenderness, no AAA no bruit.  No HSM or HJR Distal pulses intact with no bruits No edema Neuro non-focal Skin warm and dry Decreased ROM right hip     Wt Readings from Last 3 Encounters:  09/21/18 145 lb (65.8 kg)  07/20/18 140 lb (63.5 kg)  04/11/18 139 lb (63 kg)     Studies/Labs Reviewed:   EKG:  EKG is not ordered today.   Recent Labs: 11/26/2017: BUN 15; Creatinine, Ser 1.36; Hemoglobin 15.1; Platelets 199; Potassium 4.4; Sodium 137   Lipid Panel    Component Value Date/Time   CHOL 193 02/12/2016   TRIG 196 02/12/2016   HDL 29 02/12/2016    CHOLHDL 6.7 02/12/2016   VLDL 39 02/12/2016   LDLCALC 125 02/12/2016    Additional studies/ records that were reviewed today include:   Echocardiogram: 12/2015 Study Conclusions  - Left ventricle: The cavity size was mildly dilated. Wall   thickness was increased in a pattern of moderate LVH. Systolic   function was moderately reduced. The estimated ejection fraction   was in the range of 35% to 40%. Diffuse hypokinesis. - Mitral valve: There was mild regurgitation. - Left atrium: The atrium was mildly dilated. - Right ventricle: The cavity size was mildly dilated. - Right atrium: The atrium was mildly dilated. - Atrial septum: No defect or patent foramen ovale was identified. - Pulmonary arteries: PA peak pressure: 47 mm Hg (S).   NST: 10/27/2017  No diagnostic ST segment changes to indicate ischemia.  Moderate-sized, moderate intensity, inferior/inferoseptal defect that is partially reversible and suggestive of scar with mild peri-infarct ischemia.  This is an intermediate risk study.  Nuclear stress EF: 45%. Consider echocardiogram for more complete assessment.  Assessment:    No diagnosis found.   Plan:   In order of problems listed above:  1. Chronic Combined Systolic and Diastolic CHF: most recent EF 11/25/17 TTE  50-55% echo 11/25/17 improved continue medical Rx  2. Persistent Atrial Fibrillation [facility] 03/2016 continue eliquis .   3. HTN:  Well controlled.  Continue current medications and low sodium Dash type diet.    4. HLD: on statin labs with primary    6. Pacer:  Normal function by PaceArt f/u Dr Lovena Le   7. Ortho:  F/u Dr Luna Glasgow for x-rays and consider referral back to PT/OT  8. CAD: post stenting mid circumflex 11/23/17 Plavix DAT stopped after one month due to eliqus no angina   [name]    .

## 2018-09-21 ENCOUNTER — Encounter: Payer: Self-pay | Admitting: Cardiovascular Disease

## 2018-09-21 ENCOUNTER — Ambulatory Visit: Payer: Medicare HMO | Admitting: Cardiovascular Disease

## 2018-09-21 VITALS — BP 108/60 | HR 62 | Ht 68.0 in | Wt 145.0 lb

## 2018-09-21 DIAGNOSIS — Z950 Presence of cardiac pacemaker: Secondary | ICD-10-CM | POA: Diagnosis not present

## 2018-09-21 NOTE — Patient Instructions (Signed)
Medication Instructions:  Your physician recommends that you continue on your current medications as directed. Please refer to the Current Medication list given to you today.  If you need a refill on your cardiac medications before your next appointment, please call your pharmacy.   Lab work: none If you have labs (blood work) drawn today and your tests are completely normal, you will receive your results only by: Marland Kitchen MyChart Message (if you have MyChart) OR . A paper copy in the mail If you have any lab test that is abnormal or we need to change your treatment, we will call you to review the results.  Testing/Procedures: none  Follow-Up: At [facility], you and your health needs are our priority.  As part of our continuing mission to provide you with exceptional heart care, we have created designated Provider Care Teams.  These Care Teams include your primary Cardiologist (physician) and Advanced Practice Providers (APPs -  Physician Assistants and Nurse Practitioners) who all work together to provide you with the care you need, when you need it. You will need a follow up appointment in 6 months.  Please call our office 2 months in advance to schedule this appointment.  You may see [name], MD or one of the following Advanced Practice Providers on your designated Care Team:   Bernerd Pho, PA-C [facility]) . Ermalinda Barrios, PA-C ([place])  Any Other Special Instructions Will Be Listed Below (If Applicable). See Dr.Taylor in 1 year

## 2018-09-25 ENCOUNTER — Ambulatory Visit (INDEPENDENT_AMBULATORY_CARE_PROVIDER_SITE_OTHER): Payer: Medicare HMO

## 2018-09-25 DIAGNOSIS — I5042 Chronic combined systolic (congestive) and diastolic (congestive) heart failure: Secondary | ICD-10-CM

## 2018-09-25 DIAGNOSIS — I495 Sick sinus syndrome: Secondary | ICD-10-CM

## 2018-09-25 LAB — CUP PACEART REMOTE DEVICE CHECK
Battery Remaining Longevity: 112 mo
Battery Voltage: 3.01 V
Brady Statistic RA Percent Paced: 0 %
Brady Statistic RV Percent Paced: 34 %
Date Time Interrogation Session: 20191126081352
Implantable Lead Implant Date: 20190128
Implantable Lead Location: 753859
Lead Channel Impedance Value: 510 Ohm
Lead Channel Impedance Value: 540 Ohm
Lead Channel Sensing Intrinsic Amplitude: 11.2 mV
Lead Channel Setting Pacing Amplitude: 2 V
Lead Channel Setting Pacing Amplitude: 2.5 V
Lead Channel Setting Pacing Pulse Width: 0.5 ms
MDC IDC LEAD IMPLANT DT: 20190128
MDC IDC LEAD LOCATION: 753860
MDC IDC MSMT BATTERY REMAINING PERCENTAGE: 95.5 %
MDC IDC MSMT LEADCHNL RA SENSING INTR AMPL: 1.4 mV
MDC IDC MSMT LEADCHNL RV PACING THRESHOLD AMPLITUDE: 0.75 V
MDC IDC MSMT LEADCHNL RV PACING THRESHOLD PULSEWIDTH: 0.5 ms
MDC IDC PG IMPLANT DT: 20190128
MDC IDC SET LEADCHNL RV SENSING SENSITIVITY: 2 mV
MDC IDC STAT BRADY AP VP PERCENT: 0 %
MDC IDC STAT BRADY AP VS PERCENT: 0 %
MDC IDC STAT BRADY AS VP PERCENT: 0 %
MDC IDC STAT BRADY AS VS PERCENT: 0 %
Pulse Gen Model: 2272
Pulse Gen Serial Number: 8982069

## 2018-09-25 NOTE — Progress Notes (Signed)
Remote pacemaker transmission.   

## 2018-10-04 DIAGNOSIS — I959 Hypotension, unspecified: Secondary | ICD-10-CM | POA: Diagnosis not present

## 2018-10-04 DIAGNOSIS — N183 Chronic kidney disease, stage 3 (moderate): Secondary | ICD-10-CM | POA: Diagnosis not present

## 2018-10-04 DIAGNOSIS — E782 Mixed hyperlipidemia: Secondary | ICD-10-CM | POA: Diagnosis not present

## 2018-10-04 DIAGNOSIS — R7301 Impaired fasting glucose: Secondary | ICD-10-CM | POA: Diagnosis not present

## 2018-10-04 DIAGNOSIS — E875 Hyperkalemia: Secondary | ICD-10-CM | POA: Diagnosis not present

## 2018-10-04 DIAGNOSIS — N289 Disorder of kidney and ureter, unspecified: Secondary | ICD-10-CM | POA: Diagnosis not present

## 2018-10-04 DIAGNOSIS — Z7901 Long term (current) use of anticoagulants: Secondary | ICD-10-CM | POA: Diagnosis not present

## 2018-10-08 DIAGNOSIS — E875 Hyperkalemia: Secondary | ICD-10-CM | POA: Diagnosis not present

## 2018-10-08 DIAGNOSIS — I10 Essential (primary) hypertension: Secondary | ICD-10-CM | POA: Diagnosis not present

## 2018-10-08 DIAGNOSIS — N183 Chronic kidney disease, stage 3 (moderate): Secondary | ICD-10-CM | POA: Diagnosis not present

## 2018-10-08 DIAGNOSIS — I5022 Chronic systolic (congestive) heart failure: Secondary | ICD-10-CM | POA: Diagnosis not present

## 2018-10-08 DIAGNOSIS — I2510 Atherosclerotic heart disease of native coronary artery without angina pectoris: Secondary | ICD-10-CM | POA: Insufficient documentation

## 2018-10-09 DIAGNOSIS — I10 Essential (primary) hypertension: Secondary | ICD-10-CM | POA: Diagnosis not present

## 2018-10-09 DIAGNOSIS — M25552 Pain in left hip: Secondary | ICD-10-CM | POA: Diagnosis not present

## 2018-10-09 DIAGNOSIS — I2510 Atherosclerotic heart disease of native coronary artery without angina pectoris: Secondary | ICD-10-CM | POA: Diagnosis not present

## 2018-10-09 DIAGNOSIS — E782 Mixed hyperlipidemia: Secondary | ICD-10-CM | POA: Diagnosis not present

## 2018-10-09 DIAGNOSIS — I4891 Unspecified atrial fibrillation: Secondary | ICD-10-CM | POA: Diagnosis not present

## 2018-10-09 DIAGNOSIS — R7301 Impaired fasting glucose: Secondary | ICD-10-CM | POA: Diagnosis not present

## 2018-10-09 DIAGNOSIS — I5020 Unspecified systolic (congestive) heart failure: Secondary | ICD-10-CM | POA: Diagnosis not present

## 2018-10-09 DIAGNOSIS — N183 Chronic kidney disease, stage 3 (moderate): Secondary | ICD-10-CM | POA: Diagnosis not present

## 2018-10-09 DIAGNOSIS — G894 Chronic pain syndrome: Secondary | ICD-10-CM | POA: Diagnosis not present

## 2018-10-11 DIAGNOSIS — E782 Mixed hyperlipidemia: Secondary | ICD-10-CM | POA: Diagnosis not present

## 2018-10-11 DIAGNOSIS — N183 Chronic kidney disease, stage 3 (moderate): Secondary | ICD-10-CM | POA: Diagnosis not present

## 2018-10-11 DIAGNOSIS — I5032 Chronic diastolic (congestive) heart failure: Secondary | ICD-10-CM | POA: Diagnosis not present

## 2018-10-11 DIAGNOSIS — R7301 Impaired fasting glucose: Secondary | ICD-10-CM | POA: Diagnosis not present

## 2018-10-11 DIAGNOSIS — I959 Hypotension, unspecified: Secondary | ICD-10-CM | POA: Diagnosis not present

## 2018-10-11 DIAGNOSIS — J449 Chronic obstructive pulmonary disease, unspecified: Secondary | ICD-10-CM | POA: Diagnosis not present

## 2018-10-11 DIAGNOSIS — I482 Chronic atrial fibrillation, unspecified: Secondary | ICD-10-CM | POA: Diagnosis not present

## 2018-11-12 ENCOUNTER — Other Ambulatory Visit: Payer: Self-pay

## 2018-11-12 MED ORDER — APIXABAN 5 MG PO TABS: 5.0000 mg | ORAL_TABLET | Freq: Two times a day (BID) | ORAL | 5 refills | Status: DC

## 2018-11-12 NOTE — Telephone Encounter (Signed)
Pt's wife called in requesting a refill on his Eliquis. He wants the medication sent to the [facility] in [place]. Please address. Thank you.

## 2018-11-12 NOTE — Telephone Encounter (Signed)
Pt last saw Dr Johnsie Cancel 09/21/18, last labs 11/26/17 Creat 1.36, age 73, weight 65.8kg, based on specified criteria pt is on appropriate dosage of Eliquis 5mg  BID.  Will refill rx.

## 2018-12-08 DIAGNOSIS — M1990 Unspecified osteoarthritis, unspecified site: Secondary | ICD-10-CM | POA: Diagnosis not present

## 2018-12-08 DIAGNOSIS — I2510 Atherosclerotic heart disease of native coronary artery without angina pectoris: Secondary | ICD-10-CM | POA: Diagnosis not present

## 2018-12-08 DIAGNOSIS — N400 Enlarged prostate without lower urinary tract symptoms: Secondary | ICD-10-CM | POA: Diagnosis not present

## 2018-12-08 DIAGNOSIS — G8929 Other chronic pain: Secondary | ICD-10-CM | POA: Diagnosis not present

## 2018-12-08 DIAGNOSIS — J449 Chronic obstructive pulmonary disease, unspecified: Secondary | ICD-10-CM | POA: Diagnosis not present

## 2018-12-08 DIAGNOSIS — I509 Heart failure, unspecified: Secondary | ICD-10-CM | POA: Diagnosis not present

## 2018-12-08 DIAGNOSIS — I4891 Unspecified atrial fibrillation: Secondary | ICD-10-CM | POA: Diagnosis not present

## 2018-12-08 DIAGNOSIS — I739 Peripheral vascular disease, unspecified: Secondary | ICD-10-CM | POA: Diagnosis not present

## 2018-12-08 DIAGNOSIS — I110 Hypertensive heart disease with heart failure: Secondary | ICD-10-CM | POA: Diagnosis not present

## 2018-12-08 DIAGNOSIS — E785 Hyperlipidemia, unspecified: Secondary | ICD-10-CM | POA: Diagnosis not present

## 2018-12-11 ENCOUNTER — Other Ambulatory Visit: Payer: Self-pay | Admitting: Cardiovascular Disease

## 2018-12-12 DIAGNOSIS — E782 Mixed hyperlipidemia: Secondary | ICD-10-CM | POA: Diagnosis not present

## 2018-12-12 DIAGNOSIS — J449 Chronic obstructive pulmonary disease, unspecified: Secondary | ICD-10-CM | POA: Diagnosis not present

## 2018-12-12 DIAGNOSIS — I482 Chronic atrial fibrillation, unspecified: Secondary | ICD-10-CM | POA: Diagnosis not present

## 2018-12-12 DIAGNOSIS — N183 Chronic kidney disease, stage 3 (moderate): Secondary | ICD-10-CM | POA: Diagnosis not present

## 2018-12-12 DIAGNOSIS — I959 Hypotension, unspecified: Secondary | ICD-10-CM | POA: Diagnosis not present

## 2018-12-12 DIAGNOSIS — I5032 Chronic diastolic (congestive) heart failure: Secondary | ICD-10-CM | POA: Diagnosis not present

## 2018-12-12 DIAGNOSIS — R7301 Impaired fasting glucose: Secondary | ICD-10-CM | POA: Diagnosis not present

## 2018-12-13 ENCOUNTER — Other Ambulatory Visit: Payer: Self-pay | Admitting: Cardiovascular Disease

## 2018-12-25 ENCOUNTER — Ambulatory Visit (INDEPENDENT_AMBULATORY_CARE_PROVIDER_SITE_OTHER): Payer: Medicare HMO | Admitting: *Deleted

## 2018-12-25 DIAGNOSIS — I495 Sick sinus syndrome: Secondary | ICD-10-CM | POA: Diagnosis not present

## 2018-12-26 LAB — CUP PACEART REMOTE DEVICE CHECK
Battery Remaining Percentage: 95.5 %
Battery Voltage: 3.01 V
Brady Statistic AP VP Percent: 0 %
Brady Statistic AP VS Percent: 0 %
Brady Statistic AS VP Percent: 0 %
Brady Statistic AS VS Percent: 0 %
Implantable Lead Implant Date: 20190128
Implantable Lead Implant Date: 20190128
Implantable Lead Location: 753860
Implantable Pulse Generator Implant Date: 20190128
Lead Channel Impedance Value: 560 Ohm
Lead Channel Pacing Threshold Amplitude: 0.75 V
Lead Channel Pacing Threshold Pulse Width: 0.5 ms
Lead Channel Sensing Intrinsic Amplitude: 11.9 mV
Lead Channel Setting Pacing Amplitude: 2.5 V
Lead Channel Setting Sensing Sensitivity: 2 mV
MDC IDC LEAD LOCATION: 753859
MDC IDC MSMT BATTERY REMAINING LONGEVITY: 121 mo
MDC IDC MSMT LEADCHNL RA IMPEDANCE VALUE: 530 Ohm
MDC IDC MSMT LEADCHNL RA SENSING INTR AMPL: 1.4 mV
MDC IDC SESS DTM: 20200225070014
MDC IDC SET LEADCHNL RV PACING AMPLITUDE: 2 V
MDC IDC SET LEADCHNL RV PACING PULSEWIDTH: 0.5 ms
MDC IDC STAT BRADY RA PERCENT PACED: 0 %
MDC IDC STAT BRADY RV PERCENT PACED: 30 %
Pulse Gen Model: 2272
Pulse Gen Serial Number: 8982069

## 2019-01-01 ENCOUNTER — Encounter: Payer: Self-pay | Admitting: Cardiology

## 2019-01-01 NOTE — Progress Notes (Signed)
Remote pacemaker transmission.   

## 2019-01-07 ENCOUNTER — Ambulatory Visit (INDEPENDENT_AMBULATORY_CARE_PROVIDER_SITE_OTHER): Payer: Medicare HMO | Admitting: Otolaryngology

## 2019-01-29 DIAGNOSIS — W57XXXA Bitten or stung by nonvenomous insect and other nonvenomous arthropods, initial encounter: Secondary | ICD-10-CM | POA: Diagnosis not present

## 2019-01-29 DIAGNOSIS — L0390 Cellulitis, unspecified: Secondary | ICD-10-CM | POA: Diagnosis not present

## 2019-01-29 DIAGNOSIS — G894 Chronic pain syndrome: Secondary | ICD-10-CM | POA: Diagnosis not present

## 2019-02-15 DIAGNOSIS — Z0000 Encounter for general adult medical examination without abnormal findings: Secondary | ICD-10-CM | POA: Diagnosis not present

## 2019-03-14 ENCOUNTER — Telehealth: Payer: Self-pay | Admitting: Cardiovascular Disease

## 2019-03-14 NOTE — Telephone Encounter (Signed)
Virtual Visit Pre-Appointment Phone Call  "(Name), I am calling you today to discuss your upcoming appointment. We are currently trying to limit exposure to the virus that causes COVID-19 by seeing patients at home rather than in the office."  1. "What is the BEST phone number to call the day of the visit?" - include this in appointment notes  2. Do you have or have access to (through a family member/friend) a smartphone with video capability that we can use for your visit?" a. If yes - list this number in appt notes as cell (if different from BEST phone #) and list the appointment type as a VIDEO visit in appointment notes b. If no - list the appointment type as a PHONE visit in appointment notes  3. Confirm consent - "In the setting of the current Covid19 crisis, you are scheduled for a (phone or video) visit with your provider on (date) at (time).  Just as we do with many in-office visits, in order for you to participate in this visit, we must obtain consent.  If you'd like, I can send this to your mychart (if signed up) or email for you to review.  Otherwise, I can obtain your verbal consent now.  All virtual visits are billed to your insurance company just like a normal visit would be.  By agreeing to a virtual visit, we'd like you to understand that the technology does not allow for your provider to perform an examination, and thus may limit your provider's ability to fully assess your condition. If your provider identifies any concerns that need to be evaluated in person, we will make arrangements to do so.  Finally, though the technology is pretty good, we cannot assure that it will always work on either your or our end, and in the setting of a video visit, we may have to convert it to a phone-only visit.  In either situation, we cannot ensure that we have a secure connection.  Are you willing to proceed?" STAFF: Did the patient verbally acknowledge consent to telehealth visit? Document  YES/NO here: Yes  4. Advise patient to be prepared - "Two hours prior to your appointment, go ahead and check your blood pressure, pulse, oxygen saturation, and your weight (if you have the equipment to check those) and write them all down. When your visit starts, your provider will ask you for this information. If you have an Apple Watch or Kardia device, please plan to have heart rate information ready on the day of your appointment. Please have a pen and paper handy nearby the day of the visit as well."  5. Give patient instructions for MyChart download to smartphone OR [email or web address] as below if video visit (depending on what platform provider is using)  6. Inform patient they will receive a phone call 15 minutes prior to their appointment time (may be from unknown caller ID) so they should be prepared to answer    TELEPHONE CALL NOTE  GENIE MIRABAL has been deemed a candidate for a follow-up tele-health visit to limit community exposure during the Covid-19 pandemic. I spoke with the patient via phone to ensure availability of phone/video source, confirm preferred email & phone number, and discuss instructions and expectations.  I reminded SOHAIL CAPRARO to be prepared with any vital sign and/or heart rhythm information that could potentially be obtained via home monitoring, at the time of his visit. I reminded DALE RIBEIRO to expect a phone call prior to  his visit.  Bertram Gala Goins 03/14/2019 1:49 PM

## 2019-03-20 NOTE — Progress Notes (Signed)
Virtual Visit via Telephone Note   This visit type was conducted due to national recommendations for restrictions regarding the COVID-19 Pandemic (e.g. social distancing) in an effort to limit this patient's exposure and mitigate transmission in our community.  Due to his co-morbid illnesses, this patient is at least at moderate risk for complications without adequate follow up.  This format is felt to be most appropriate for this patient at this time.  The patient did not have access to video technology/had technical difficulties with video requiring transitioning to audio format only (telephone).  All issues noted in this document were discussed and addressed.  No physical exam could be performed with this format.  Please refer to the patient's chart for his  consent to telehealth for [facility].   Date:  03/22/2019   ID:  Ricardo Garrett, DOB 1976-01-28, MRN [id number]  Patient Location: Home Provider Location: Office  PCP:  [name], MD  Cardiologist:  [name], MD   Electrophysiologist:  [name], MD   Evaluation Performed:  Follow-Up Visit  Chief Complaint:  Pacer/ Afib  History of Present Illness:    73 y.o. CHF EF 35-40%, PAF, HTN, HLD Had abnormal stress test and set up for cath 11/23/17 with orbital atherectomy and stenting of mid circumflex with DES and PCI small OM branch done by Dr Veronda Prude. Plan for DAT month then Eliquis and Plavix. 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 Wife indicates ETOH and Marijuana use Able to walk his dogs in am. Pain in legs "Bone in my leg popped into place" and feels better   No chest pain   The patient does not have symptoms concerning for COVID-19 infection (fever, chills, cough, or new shortness of breath).    Past Medical History:   Diagnosis Date  . Arthritis    "finger on right hand" (11/23/2017)  . Broken arm ~ 1952   "no OR; just reset it; not sure which side"  . CAD (coronary artery disease)   . CHF (congestive heart failure) ([place])    a. 12/2015: echo showing reduced EF of 35-40% b. NST: 09/2017: scar with peri-infarct ischemia, intermediate-risk study  . COPD (chronic obstructive pulmonary disease) ([place])    "was on RX when he had CHF; not on anything now" (11/23/2017)  . High cholesterol   . HOH (hard of hearing)   . Hypertension   . Persistent atrial fibrillation    a. s/p DCCV in 03/2016   Past Surgical History:  Procedure Laterality Date  . CARDIAC CATHETERIZATION  11/23/2017  . CARDIOVERSION N/A 04/05/2016   Procedure: CARDIOVERSION;  Surgeon: Josue Hector, MD;  [facility];  Service: Cardiovascular;  Laterality: N/A;  . COMPRESSION HIP SCREW Right 03/15/2013   Procedure: COMPRESSION HIP;  Surgeon: Sanjuana Kava, MD;  Location: [facility];  Service: Orthopedics;  Laterality: Right;  . CORONARY ATHERECTOMY N/A 11/24/2017   Procedure: CORONARY ATHERECTOMY;  Surgeon: Burnell Blanks, MD;  Location: [place] [facility];  Service: Cardiovascular;  Laterality: N/A;  . CORONARY BALLOON ANGIOPLASTY N/A 11/24/2017   Procedure: CORONARY BALLOON ANGIOPLASTY;  Surgeon: Burnell Blanks, MD;  Location: [place] [facility];  Service: Cardiovascular;  Laterality: N/A;  . CORONARY STENT INTERVENTION N/A 11/24/2017   Procedure: CORONARY STENT INTERVENTION;  Surgeon: Burnell Blanks,  MD;  Location: [place] [facility];  Service: Cardiovascular;  Laterality: N/A;  . FRACTURE SURGERY    . PACEMAKER IMPLANT N/A 11/27/2017   Procedure: PACEMAKER IMPLANT;  Surgeon: Evans Lance, MD;  Location: [place] [facility];  Service: Cardiovascular;  Laterality: N/A;  . RIGHT/LEFT HEART CATH AND CORONARY ANGIOGRAPHY N/A 11/23/2017   Procedure: RIGHT/LEFT HEART CATH AND CORONARY ANGIOGRAPHY;  Surgeon: Burnell Blanks, MD;  Location: [place] [facility];  Service: Cardiovascular;  Laterality: N/A;  . SKIN GRAFT Left    from right thigh  . TONSILLECTOMY       Current Meds  Medication Sig  . apixaban (ELIQUIS) 5 MG TABS tablet Take 1 tablet (5 mg total) by mouth 2 (two) times daily. Do not restart until seen in office for wound check appointment  . atorvastatin (LIPITOR) 80 MG tablet Take 1 tablet by mouth daily.  . clopidogrel (PLAVIX) 75 MG tablet TAKE 1 TABLET BY MOUTH ONCE DAILY  . digoxin (LANOXIN) 0.125 MG tablet TAKE 1 TABLET BY MOUTH ONCE DAILY  . ezetimibe (ZETIA) 10 MG tablet Take 1 tablet by mouth daily.  . furosemide (LASIX) 40 MG tablet Take 40 mg by mouth daily.  Marland Kitchen HYDROcodone-acetaminophen (NORCO/VICODIN) 5-325 MG tablet One tablet by mouth every six hours as needed for pain.  Marland Kitchen KLOR-CON M10 10 MEQ tablet Take 10 mEq by mouth daily.  Marland Kitchen lisinopril (PRINIVIL,ZESTRIL) 40 MG tablet TAKE 1 TABLET BY MOUTH ONCE DAILY  . losartan (COZAAR) 50 MG tablet Take 1 tablet by mouth daily.  . metoprolol succinate (TOPROL-XL) 25 MG 24 hr tablet TAKE 1/2 (ONE-HALF) TABLET BY MOUTH IN THE MORNING AND 1 TABLET IN THE EVENING     Allergies:   Patient has no known allergies.   Social History   Tobacco Use  . Smoking status: Former Smoker    Packs/day: 1.00    Years: 20.00    Pack years: 20.00    Types: Cigarettes    Last attempt to quit: 11/01/1979    Years since quitting: 39.4  . Smokeless tobacco: Never Used  Substance Use Topics  . Alcohol use: Yes    Comment: 11/23/2017 "beer once/month maybe"  . Drug use: Yes    Types: Marijuana    Comment: 11/23/2017 "nothing for years"     Family Hx: The patient's family history includes Diabetes in his mother; Heart attack in his father.  ROS:   Please see the history of present illness.     All other systems reviewed and are negative.   Prior CV studies:   The following studies were reviewed today:  PACEART 12/25/18 Echo 11/25/17 EF  50-55%  Labs/Other Tests and Data Reviewed:    EKG:   07/20/18  afib rate 68 intermittent V pacing   Recent Labs: No results found for requested labs within last 8760 hours.   Recent Lipid Panel Lab Results  Component Value Date/Time   CHOL 193 02/12/2016   TRIG 196 02/12/2016   HDL 29 02/12/2016   CHOLHDL 6.7 02/12/2016   LDLCALC 125 02/12/2016    Wt Readings from Last 3 Encounters:  03/22/19 55.1 kg  09/21/18 65.8 kg  07/20/18 63.5 kg     Objective:    Vital Signs:  BP 104/64   Pulse 64   Ht 5\' 8"  (1.727 m)   Wt 55.1 kg   BMI 18.46 kg/m    Phone visit no exam   ASSESSMENT & PLAN:    1. Chronic  Combined Systolic and Diastolic CHF: most recent EF 11/25/17 TTE  50-55% echo 11/25/17 improved continue medical Rx  2. Persistent Atrial Fibrillation  Failed [facility] 03/2016 continue eliquis .   3. HTN:  Well controlled.  Continue current medications and low sodium Dash type diet.    4. HLD: on statin labs with primary    6. Pacer:  Normal function by PaceArt f/u Dr Lovena Le   7. Ortho:  F/u Dr Luna Glasgow for x-rays and consider referral back to PT/OT  8. CAD: post stenting mid circumflex 11/23/17 On plavix and eliquis no ASA No angina stable   Needs blood work for dig level and lytes with CBC in 4 weeks   COVID-19 Education: The signs and symptoms of COVID-19 were discussed with the patient and how to seek care for testing (follow up with PCP or arrange E-visit).  The importance of social distancing was discussed today.  Time:   Today, I have spent 30 minutes with the patient with telehealth technology discussing the above problems.     Medication Adjustments/Labs and Tests Ordered: Current medicines are reviewed at length with the patient today.  Concerns regarding medicines are outlined above.   Tests Ordered:  Lytes, digoxin level and CBC 4 weeks   Medication Changes: No orders of the defined types were placed in this encounter.   Disposition:  Follow up  in 6 months   Signed, [name], MD  03/22/2019 10:17 AM    [place]

## 2019-03-22 ENCOUNTER — Telehealth (INDEPENDENT_AMBULATORY_CARE_PROVIDER_SITE_OTHER): Payer: Medicare HMO | Admitting: Cardiovascular Disease

## 2019-03-22 ENCOUNTER — Encounter: Payer: Self-pay | Admitting: Cardiovascular Disease

## 2019-03-22 VITALS — BP 104/64 | HR 64 | Ht 68.0 in | Wt 121.4 lb

## 2019-03-22 DIAGNOSIS — Z79899 Other long term (current) drug therapy: Secondary | ICD-10-CM

## 2019-03-22 DIAGNOSIS — I2510 Atherosclerotic heart disease of native coronary artery without angina pectoris: Secondary | ICD-10-CM

## 2019-03-22 DIAGNOSIS — I5022 Chronic systolic (congestive) heart failure: Secondary | ICD-10-CM | POA: Diagnosis not present

## 2019-03-22 NOTE — Progress Notes (Signed)
Medication Instructions:  Your physician recommends that you continue on your current medications as directed. Please refer to the Current Medication list given to you today.  Labwork: 4 WEEKS  DIGOXIN LEVEL BMET CBC   Testing/Procedures: NONE  Follow-Up: Your physician wants you to follow-up in: 6 MONTHS. You will receive a reminder letter in the mail two months in advance. If you don't receive a letter, please call our office to schedule the follow-up appointment.   Any Other Special Instructions Will Be Listed Below (If Applicable).     If you need a refill on your cardiac medications before your next appointment, please call your pharmacy.

## 2019-03-22 NOTE — Addendum Note (Signed)
Addended by: Debbora Lacrosse R on: 03/22/2019 10:36 AM   Modules accepted: Orders

## 2019-03-24 ENCOUNTER — Other Ambulatory Visit: Payer: Self-pay | Admitting: Nurse Practitioner

## 2019-03-26 ENCOUNTER — Ambulatory Visit (INDEPENDENT_AMBULATORY_CARE_PROVIDER_SITE_OTHER): Payer: Medicare HMO | Admitting: *Deleted

## 2019-03-26 DIAGNOSIS — I5022 Chronic systolic (congestive) heart failure: Secondary | ICD-10-CM | POA: Diagnosis not present

## 2019-03-26 DIAGNOSIS — I495 Sick sinus syndrome: Secondary | ICD-10-CM

## 2019-03-26 LAB — CUP PACEART REMOTE DEVICE CHECK
Date Time Interrogation Session: 20200526120933
Implantable Lead Implant Date: 20190128
Implantable Lead Implant Date: 20190128
Implantable Lead Location: 753859
Implantable Lead Location: 753860
Implantable Pulse Generator Implant Date: 20190128
Pulse Gen Model: 2272
Pulse Gen Serial Number: 8982069

## 2019-04-03 NOTE — Progress Notes (Signed)
Remote pacemaker transmission.   

## 2019-04-05 DIAGNOSIS — I10 Essential (primary) hypertension: Secondary | ICD-10-CM | POA: Diagnosis not present

## 2019-04-05 DIAGNOSIS — E782 Mixed hyperlipidemia: Secondary | ICD-10-CM | POA: Diagnosis not present

## 2019-04-05 DIAGNOSIS — N183 Chronic kidney disease, stage 3 (moderate): Secondary | ICD-10-CM | POA: Diagnosis not present

## 2019-04-05 DIAGNOSIS — R7301 Impaired fasting glucose: Secondary | ICD-10-CM | POA: Diagnosis not present

## 2019-04-12 DIAGNOSIS — I482 Chronic atrial fibrillation, unspecified: Secondary | ICD-10-CM | POA: Diagnosis not present

## 2019-04-12 DIAGNOSIS — I5022 Chronic systolic (congestive) heart failure: Secondary | ICD-10-CM | POA: Diagnosis not present

## 2019-04-12 DIAGNOSIS — Z0001 Encounter for general adult medical examination with abnormal findings: Secondary | ICD-10-CM | POA: Diagnosis not present

## 2019-04-12 DIAGNOSIS — I2510 Atherosclerotic heart disease of native coronary artery without angina pectoris: Secondary | ICD-10-CM | POA: Diagnosis not present

## 2019-04-12 DIAGNOSIS — Z950 Presence of cardiac pacemaker: Secondary | ICD-10-CM | POA: Diagnosis not present

## 2019-04-12 DIAGNOSIS — E782 Mixed hyperlipidemia: Secondary | ICD-10-CM | POA: Diagnosis not present

## 2019-04-12 DIAGNOSIS — N183 Chronic kidney disease, stage 3 (moderate): Secondary | ICD-10-CM | POA: Diagnosis not present

## 2019-04-12 DIAGNOSIS — I10 Essential (primary) hypertension: Secondary | ICD-10-CM | POA: Diagnosis not present

## 2019-04-12 DIAGNOSIS — E875 Hyperkalemia: Secondary | ICD-10-CM | POA: Diagnosis not present

## 2019-04-12 DIAGNOSIS — G894 Chronic pain syndrome: Secondary | ICD-10-CM | POA: Diagnosis not present

## 2019-04-21 ENCOUNTER — Other Ambulatory Visit: Payer: Self-pay | Admitting: Cardiovascular Disease

## 2019-04-28 ENCOUNTER — Other Ambulatory Visit: Payer: Self-pay | Admitting: Cardiovascular Disease

## 2019-05-01 DIAGNOSIS — R7301 Impaired fasting glucose: Secondary | ICD-10-CM | POA: Diagnosis not present

## 2019-05-01 DIAGNOSIS — J449 Chronic obstructive pulmonary disease, unspecified: Secondary | ICD-10-CM | POA: Diagnosis not present

## 2019-05-01 DIAGNOSIS — I482 Chronic atrial fibrillation, unspecified: Secondary | ICD-10-CM | POA: Diagnosis not present

## 2019-05-01 DIAGNOSIS — I959 Hypotension, unspecified: Secondary | ICD-10-CM | POA: Diagnosis not present

## 2019-05-01 DIAGNOSIS — E782 Mixed hyperlipidemia: Secondary | ICD-10-CM | POA: Diagnosis not present

## 2019-05-01 DIAGNOSIS — I5032 Chronic diastolic (congestive) heart failure: Secondary | ICD-10-CM | POA: Diagnosis not present

## 2019-05-01 DIAGNOSIS — N183 Chronic kidney disease, stage 3 (moderate): Secondary | ICD-10-CM | POA: Diagnosis not present

## 2019-05-02 ENCOUNTER — Telehealth: Payer: Self-pay | Admitting: Cardiovascular Disease

## 2019-05-02 MED ORDER — DIGOXIN 125 MCG PO TABS: 125.0000 ug | ORAL_TABLET | Freq: Every day | ORAL | 3 refills | Status: DC

## 2019-05-02 MED ORDER — CLOPIDOGREL BISULFATE 75 MG PO TABS: 75.0000 mg | ORAL_TABLET | Freq: Every day | ORAL | 3 refills | Status: DC

## 2019-05-02 MED ORDER — APIXABAN 5 MG PO TABS: 5.0000 mg | ORAL_TABLET | Freq: Two times a day (BID) | ORAL | 5 refills | Status: DC

## 2019-05-02 MED ORDER — KLOR-CON M10 10 MEQ PO TBCR: 10.0000 meq | EXTENDED_RELEASE_TABLET | Freq: Every day | ORAL | 2 refills | Status: DC

## 2019-05-02 MED ORDER — LISINOPRIL 40 MG PO TABS: 40.0000 mg | ORAL_TABLET | Freq: Every day | ORAL | 0 refills | Status: DC

## 2019-05-02 MED ORDER — METOPROLOL SUCCINATE ER 25 MG PO TB24: ORAL_TABLET | ORAL | 3 refills | Status: DC

## 2019-05-02 MED ORDER — FUROSEMIDE 40 MG PO TABS: 40.0000 mg | ORAL_TABLET | Freq: Every day | ORAL | 6 refills | Status: DC

## 2019-05-02 MED ORDER — ATORVASTATIN CALCIUM 80 MG PO TABS: 80.0000 mg | ORAL_TABLET | Freq: Every day | ORAL | 3 refills | Status: DC

## 2019-05-02 NOTE — Telephone Encounter (Signed)
DONE

## 2019-05-02 NOTE — Telephone Encounter (Signed)
Pt has changed his pharmacy and is now w/ [facility] and will need all his Rx's sent there per [facility] @ Dr. Durene Cal office

## 2019-05-19 ENCOUNTER — Other Ambulatory Visit: Payer: Self-pay | Admitting: Cardiovascular Disease

## 2019-05-20 ENCOUNTER — Encounter: Payer: Self-pay | Admitting: Nurse Practitioner

## 2019-05-20 MED ORDER — APIXABAN 2.5 MG PO TABS: 2.5000 mg | ORAL_TABLET | Freq: Two times a day (BID) | ORAL | 5 refills | Status: DC

## 2019-05-20 NOTE — Telephone Encounter (Signed)
Age 73, weight 55kg, SCr 1.86 on 04/05/19 at [facility], last OV 03/2019, afib indication. Pt qualifies for lower dose of 2.5mg  BID due to weight < 60kg and SCr > 1.5. Called pt to make him aware of the dose change.

## 2019-05-21 ENCOUNTER — Telehealth: Payer: Self-pay | Admitting: Nurse Practitioner

## 2019-05-21 NOTE — Progress Notes (Deleted)
CARDIOLOGY OFFICE NOTE  Date:  05/21/2019    Ricardo Garrett Date of Birth: 12-May-1946 Medical Record #[id number]  PCP:  Ricardo Squibb, MD  Cardiologist:  Ricardo Garrett   No chief complaint on file.   History of Present Illness: Ricardo Garrett is a 73 y.o. male who presents today for a follow up visit. Seen for Dr. Johnsie Garrett.   He has a history of chronic systolic HF with EF of 35 to 40%, PAF, HTN, HLD and prior abnormal stress test that led to cath in 2019 with orbital atherectomy and stenting of the mid LCX with DES and PCI to small OM branch in 10/2017. 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 Lovena Garrett. 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 Last echo from 10/2017 with EF of 50 to 55%.   Last seen here by telehealth visit in May by Dr. Johnsie Garrett.   The patient {does/does not:200015} have symptoms concerning for COVID-19 infection (fever, chills, cough, or new shortness of breath).   Comes in today. Here with   Past Medical History:  Diagnosis Date  . Arthritis    "finger on right hand" (11/23/2017)  . Broken arm ~ 1952   "no OR; just reset it; not sure which side"  . CAD (coronary artery disease)   . CHF (congestive heart failure) ([place])    a. 12/2015: echo showing reduced EF of 35-40% b. NST: 09/2017: scar with peri-infarct ischemia, intermediate-risk study  . COPD (chronic obstructive pulmonary disease) ([place])    "was on RX when he had CHF; not on anything now" (11/23/2017)  . High cholesterol   . HOH (hard of hearing)   . Hypertension   . Persistent atrial fibrillation    a. s/p DCCV in 03/2016    Past Surgical History:  Procedure Laterality Date  . CARDIAC CATHETERIZATION  11/23/2017  . CARDIOVERSION N/A 04/05/2016   Procedure: CARDIOVERSION;  Surgeon: Ricardo Hector, MD;  [facility];  Service: Cardiovascular;  Laterality: N/A;  . COMPRESSION HIP SCREW Right 03/15/2013   Procedure: COMPRESSION HIP;  Surgeon: Ricardo Kava, MD;   Location: [facility];  Service: Orthopedics;  Laterality: Right;  . CORONARY ATHERECTOMY N/A 11/24/2017   Procedure: CORONARY ATHERECTOMY;  Surgeon: Ricardo Blanks, MD;  Location: [place] [facility];  Service: Cardiovascular;  Laterality: N/A;  . CORONARY BALLOON ANGIOPLASTY N/A 11/24/2017   Procedure: CORONARY BALLOON ANGIOPLASTY;  Surgeon: Ricardo Blanks, MD;  Location: [place] [facility];  Service: Cardiovascular;  Laterality: N/A;  . CORONARY STENT INTERVENTION N/A 11/24/2017   Procedure: CORONARY STENT INTERVENTION;  Surgeon: Ricardo Blanks, MD;  Location: [place] [facility];  Service: Cardiovascular;  Laterality: N/A;  . FRACTURE SURGERY    . PACEMAKER IMPLANT N/A 11/27/2017   Procedure: PACEMAKER IMPLANT;  Surgeon: Ricardo Lance, MD;  Location: [place] [facility];  Service: Cardiovascular;  Laterality: N/A;  . RIGHT/LEFT HEART CATH AND CORONARY ANGIOGRAPHY N/A 11/23/2017   Procedure: RIGHT/LEFT HEART CATH AND CORONARY ANGIOGRAPHY;  Surgeon: Ricardo Blanks, MD;  Location: [place] [facility];  Service: Cardiovascular;  Laterality: N/A;  . SKIN GRAFT Left    from right thigh  . TONSILLECTOMY       Medications: No outpatient medications have been marked as taking for the 05/22/19 encounter (Appointment) with Ricardo Junes, NP.     Allergies: No Known Allergies  Social History: The patient  reports that he quit smoking about 39 years  ago. His smoking use included cigarettes. He has a 20.00 pack-year smoking history. He has never used smokeless tobacco. He reports current alcohol use. He reports current drug use. Drug: Marijuana.   Family History: The patient's ***family history includes Diabetes in his mother; Heart attack in his father.   Review of Systems: Please see the history of present illness.   All other systems are reviewed and negative.   Physical Exam: VS:  There were no vitals taken for this visit. Marland Kitchen  BMI There is no height or weight on  file to calculate BMI.  Wt Readings from Last 3 Encounters:  03/22/19 121 lb 6.4 oz (55.1 kg)  09/21/18 145 lb (65.8 kg)  07/20/18 140 lb (63.5 kg)    General: Pleasant. Well developed, well nourished and in no acute distress.   HEENT: Normal.  Neck: Supple, no JVD, carotid bruits, or masses noted.  Cardiac: ***Regular rate and rhythm. No murmurs, rubs, or gallops. No edema.  Respiratory:  Lungs are clear to auscultation bilaterally with normal work of breathing.  GI: Soft and nontender.  MS: No deformity or atrophy. Gait and ROM intact.  Skin: Warm and dry. Color is normal.  Neuro:  Strength and sensation are intact and no gross focal deficits noted.  Psych: Alert, appropriate and with normal affect.   LABORATORY DATA:  EKG:  EKG {ACTION; IS/IS AVW:[id number]} ordered today. This demonstrates ***.  Lab Results  Component Value Date   WBC 8.2 11/26/2017   HGB 15.1 11/26/2017   HCT 43.9 11/26/2017   PLT 199 11/26/2017   GLUCOSE 107 (H) 11/26/2017   CHOL 193 02/12/2016   TRIG 196 02/12/2016   HDL 29 02/12/2016   LDLCALC 125 02/12/2016   ALT 41 02/12/2016   AST 27 02/12/2016   NA 137 11/26/2017   K 4.4 11/26/2017   CL 108 11/26/2017   CREATININE 1.36 (H) 11/26/2017   BUN 15 11/26/2017   CO2 22 11/26/2017   TSH 3.998 01/20/2016   INR 1.22 03/09/2016   HGBA1C 6.2 02/12/2016       BNP (last 3 results) No results for input(s): BNP in the last 8760 hours.  ProBNP (last 3 results) No results for input(s): PROBNP in the last 8760 hours.   Other Studies Reviewed Today:  ECHO Study Conclusions 10/2017  - Left ventricle: The cavity size was normal. Wall thickness was   increased in a pattern of moderate LVH. Systolic function was   normal. The estimated ejection fraction was in the range of 50%   to 55%. The study is not technically sufficient to allow   evaluation of LV diastolic function. - Aortic valve: Mildly calcified annulus. Trileaflet; mildly   thickened  leaflets. Valve area (VTI): 2.74 cm^2. Valve area   (Vmax): 2.42 cm^2. Valve area (Vmean): 2.2 cm^2. - Atrial septum: A patent foramen ovale cannot be excluded. - Pulmonary arteries: Systolic pressure was moderately increased.   PA peak pressure: 45 mm Hg (S).    CORONARY BALLOON ANGIOPLASTY 10/2017  CORONARY STENT INTERVENTION  CORONARY ATHERECTOMY  Conclusion  1. Severe stenosis mid Circumflex artery involving the small caliber OM branch 2. Successful orbital atherectomy/PTCA/DES x 1 mid Circumflex.  3. Successful balloon angioplasty of the small OM branch ostium  Recommendations: Continue beta blocker, statin. Continue DAPT with ASA and Plavix for one month along with Eliquis. In one month, stop ASA and just continue Plavix with Eliquis.       Assessment/Plan:  1. CAD with prior PCI  and PTCA from January 2019 - only on Plavix and Eliquis.   2. Persistent AF  3. Chronic Combined Systolic and Diastolic CHF:   4. HTN  5. HLD  6. Underlying PPM  7. COVID-19 Education: The signs and symptoms of COVID-19 were discussed with the patient and how to seek care for testing (follow up with PCP or arrange E-visit).  The importance of social distancing, staying at home, hand hygiene and wearing a mask when out in public were discussed today.  Current medicines are reviewed with the patient today.  The patient does not have concerns regarding medicines other than what has been noted above.  The following changes have been made:  See above.  Labs/ tests ordered today include:   No orders of the defined types were placed in this encounter.    Disposition:   FU with *** in {gen number 8-67:544920} {Days to years:10300}.   Patient is agreeable to this plan and will call if any problems develop in the interim.   SignedTruitt Merle, NP  05/21/2019 7:24 AM  [facility] [address] [place], [place]  [address] Phone: [phone]  Fax: [phone]

## 2019-05-21 NOTE — Telephone Encounter (Signed)
New Message          COVID-19 Pre-Screening Questions:   In the past 7 to 10 days have you had a cough,  shortness of breath, headache, congestion, fever (100 or greater) body aches, chills, sore throat, or sudden loss of taste or sense of smell? NO  Have you been around anyone with known Covid 19. NO  Have you been around anyone who is awaiting Covid 19 test results in the past 7 to 10 days? NO  Have you been around anyone who has been exposed to Covid 19, or has mentioned symptoms of Covid 19 within the past 7 to 10 days? NO  Pts son says the pt is unable to wear a mask due to a medical condition   If you have any concerns/questions about symptoms patients report during screening (either on the phone or at threshold). Contact the provider seeing the patient or DOD for further guidance.  If neither are available contact a member of the leadership team.

## 2019-05-21 NOTE — Telephone Encounter (Signed)
Returned call to son.  Advised we are not seeing any patient's that are not wearing masks.  Son states that is against the law.  Advised it was not.  And this is policy.  And if Pt does not want to wear a mask his appointment can be rescheduled.  Son asked if our office was in a public building.  Advised I did not know.  Will make management aware.

## 2019-05-22 ENCOUNTER — Ambulatory Visit: Payer: Medicare HMO | Admitting: Nurse Practitioner

## 2019-06-11 DIAGNOSIS — N183 Chronic kidney disease, stage 3 (moderate): Secondary | ICD-10-CM | POA: Diagnosis not present

## 2019-06-11 DIAGNOSIS — R7301 Impaired fasting glucose: Secondary | ICD-10-CM | POA: Diagnosis not present

## 2019-06-11 DIAGNOSIS — I5032 Chronic diastolic (congestive) heart failure: Secondary | ICD-10-CM | POA: Diagnosis not present

## 2019-06-11 DIAGNOSIS — I482 Chronic atrial fibrillation, unspecified: Secondary | ICD-10-CM | POA: Diagnosis not present

## 2019-06-11 DIAGNOSIS — I959 Hypotension, unspecified: Secondary | ICD-10-CM | POA: Diagnosis not present

## 2019-06-11 DIAGNOSIS — J449 Chronic obstructive pulmonary disease, unspecified: Secondary | ICD-10-CM | POA: Diagnosis not present

## 2019-06-11 DIAGNOSIS — E782 Mixed hyperlipidemia: Secondary | ICD-10-CM | POA: Diagnosis not present

## 2019-06-26 ENCOUNTER — Ambulatory Visit (INDEPENDENT_AMBULATORY_CARE_PROVIDER_SITE_OTHER): Payer: Medicare HMO | Admitting: *Deleted

## 2019-06-26 DIAGNOSIS — I495 Sick sinus syndrome: Secondary | ICD-10-CM | POA: Diagnosis not present

## 2019-06-26 LAB — CUP PACEART REMOTE DEVICE CHECK
Battery Remaining Longevity: 121 mo
Battery Remaining Percentage: 95.5 %
Battery Voltage: 3.01 V
Brady Statistic AP VP Percent: 0 %
Brady Statistic AP VS Percent: 0 %
Brady Statistic AS VP Percent: 0 %
Brady Statistic AS VS Percent: 0 %
Brady Statistic RA Percent Paced: 0 %
Brady Statistic RV Percent Paced: 28 %
Date Time Interrogation Session: 20200825060902
Implantable Lead Implant Date: 20190128
Implantable Lead Implant Date: 20190128
Implantable Lead Location: 753859
Implantable Lead Location: 753860
Implantable Pulse Generator Implant Date: 20190128
Lead Channel Impedance Value: 530 Ohm
Lead Channel Impedance Value: 560 Ohm
Lead Channel Pacing Threshold Amplitude: 0.75 V
Lead Channel Pacing Threshold Pulse Width: 0.5 ms
Lead Channel Sensing Intrinsic Amplitude: 1.4 mV
Lead Channel Sensing Intrinsic Amplitude: 12 mV
Lead Channel Setting Pacing Amplitude: 2 V
Lead Channel Setting Pacing Amplitude: 2.5 V
Lead Channel Setting Pacing Pulse Width: 0.5 ms
Lead Channel Setting Sensing Sensitivity: 2 mV
Pulse Gen Model: 2272
Pulse Gen Serial Number: 8982069

## 2019-07-04 ENCOUNTER — Encounter: Payer: Self-pay | Admitting: Cardiology

## 2019-07-04 NOTE — Progress Notes (Signed)
Remote pacemaker transmission.   

## 2019-07-10 DIAGNOSIS — I959 Hypotension, unspecified: Secondary | ICD-10-CM | POA: Diagnosis not present

## 2019-07-10 DIAGNOSIS — N183 Chronic kidney disease, stage 3 (moderate): Secondary | ICD-10-CM | POA: Diagnosis not present

## 2019-07-10 DIAGNOSIS — E782 Mixed hyperlipidemia: Secondary | ICD-10-CM | POA: Diagnosis not present

## 2019-07-10 DIAGNOSIS — I482 Chronic atrial fibrillation, unspecified: Secondary | ICD-10-CM | POA: Diagnosis not present

## 2019-07-10 DIAGNOSIS — J449 Chronic obstructive pulmonary disease, unspecified: Secondary | ICD-10-CM | POA: Diagnosis not present

## 2019-07-10 DIAGNOSIS — I5032 Chronic diastolic (congestive) heart failure: Secondary | ICD-10-CM | POA: Diagnosis not present

## 2019-07-10 DIAGNOSIS — R7301 Impaired fasting glucose: Secondary | ICD-10-CM | POA: Diagnosis not present

## 2019-08-12 DIAGNOSIS — I959 Hypotension, unspecified: Secondary | ICD-10-CM | POA: Diagnosis not present

## 2019-08-12 DIAGNOSIS — J449 Chronic obstructive pulmonary disease, unspecified: Secondary | ICD-10-CM | POA: Diagnosis not present

## 2019-08-12 DIAGNOSIS — R7301 Impaired fasting glucose: Secondary | ICD-10-CM | POA: Diagnosis not present

## 2019-08-12 DIAGNOSIS — E782 Mixed hyperlipidemia: Secondary | ICD-10-CM | POA: Diagnosis not present

## 2019-08-12 DIAGNOSIS — I5032 Chronic diastolic (congestive) heart failure: Secondary | ICD-10-CM | POA: Diagnosis not present

## 2019-08-12 DIAGNOSIS — I482 Chronic atrial fibrillation, unspecified: Secondary | ICD-10-CM | POA: Diagnosis not present

## 2019-08-26 ENCOUNTER — Other Ambulatory Visit: Payer: Self-pay | Admitting: Cardiovascular Disease

## 2019-08-26 NOTE — Progress Notes (Signed)
Date:  09/02/2019   ID:  Ricardo Garrett, DOB [birth year], MRN [id number]  Provider Location: Office  PCP:  [name], MD  Cardiologist:  [name], MD   Electrophysiologist:  [name], MD   Evaluation Performed:  Follow-Up Visit  Chief Complaint:  Pacer/ Afib  History of Present Illness:    73 y.o. CHF EF 35-40%, PAF, HTN, HLD Had abnormal stress test and set up for cath 11/23/17 with orbital atherectomy and stenting of mid circumflex with DES and PCI small OM branch done by Dr Veronda Prude. Plan for DAT month then Eliquis and Plavix. 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 Wife indicates ETOH and Marijuana use Able to walk his dogs in am   No chest pain   Still seems to be having ETOH/Marijuana from time to time   The patient does not have symptoms concerning for COVID-19 infection (fever, chills, cough, or new shortness of breath).    Past Medical History:  Diagnosis Date  . Arthritis    "finger on right hand" (11/23/2017)  . Broken arm ~ 1952   "no OR; just reset it; not sure which side"  . CAD (coronary artery disease)   . CHF (congestive heart failure) ([place])    a. 12/2015: echo showing reduced EF of 35-40% b. NST: 09/2017: scar with peri-infarct ischemia, intermediate-risk study  . COPD (chronic obstructive pulmonary disease) ([place])    "was on RX when he had CHF; not on anything now" (11/23/2017)  . High cholesterol   . HOH (hard of hearing)   . Hypertension   . Persistent atrial fibrillation ([place])    a. s/p DCCV in 03/2016   Past Surgical History:  Procedure Laterality Date  . CARDIAC CATHETERIZATION  11/23/2017  . CARDIOVERSION N/A 04/05/2016   Procedure: CARDIOVERSION;  Surgeon: Josue Hector, MD;  [facility];  Service: Cardiovascular;  Laterality: N/A;  .  COMPRESSION HIP SCREW Right 03/15/2013   Procedure: COMPRESSION HIP;  Surgeon: Sanjuana Kava, MD;  Location: [facility];  Service: Orthopedics;  Laterality: Right;  . CORONARY ATHERECTOMY N/A 11/24/2017   Procedure: CORONARY ATHERECTOMY;  Surgeon: Burnell Blanks, MD;  Location: [place] [facility];  Service: Cardiovascular;  Laterality: N/A;  . CORONARY BALLOON ANGIOPLASTY N/A 11/24/2017   Procedure: CORONARY BALLOON ANGIOPLASTY;  Surgeon: Burnell Blanks, MD;  Location: [place] [facility];  Service: Cardiovascular;  Laterality: N/A;  . CORONARY STENT INTERVENTION N/A 11/24/2017   Procedure: CORONARY STENT INTERVENTION;  Surgeon: Burnell Blanks, MD;  Location: [place] [facility];  Service: Cardiovascular;  Laterality: N/A;  . FRACTURE SURGERY    . PACEMAKER IMPLANT N/A 11/27/2017   Procedure: PACEMAKER IMPLANT;  Surgeon: Evans Lance, MD;  Location: [place] [facility];  Service: Cardiovascular;  Laterality: N/A;  . RIGHT/LEFT HEART CATH AND CORONARY ANGIOGRAPHY N/A 11/23/2017   Procedure: RIGHT/LEFT HEART CATH AND CORONARY ANGIOGRAPHY;  Surgeon: Burnell Blanks, MD;  Location: [place] [facility];  Service: Cardiovascular;  Laterality: N/A;  . SKIN GRAFT Left    from right thigh  . TONSILLECTOMY       Current Meds  Medication Sig  . apixaban (ELIQUIS) 2.5 MG TABS tablet Take 1 tablet (2.5 mg total) by mouth 2 (two) times daily.  Marland Kitchen atorvastatin (LIPITOR) 80 MG tablet  Take 1 tablet (80 mg total) by mouth daily.  . clopidogrel (PLAVIX) 75 MG tablet Take 1 tablet (75 mg total) by mouth daily.  . digoxin (LANOXIN) 0.125 MG tablet Take 1 tablet (125 mcg total) by mouth daily.  Marland Kitchen ezetimibe (ZETIA) 10 MG tablet Take 1 tablet by mouth daily.  . furosemide (LASIX) 40 MG tablet Take 1 tablet (40 mg total) by mouth daily.  Marland Kitchen HYDROcodone-acetaminophen (NORCO/VICODIN) 5-325 MG tablet One tablet by mouth every six hours as needed for pain.  Marland Kitchen KLOR-CON M10 10 MEQ tablet Take 1  tablet (10 mEq total) by mouth daily.  Marland Kitchen lisinopril (ZESTRIL) 40 MG tablet Take 1 tablet by mouth once daily  . losartan (COZAAR) 50 MG tablet Take 1 tablet by mouth daily.  . metoprolol succinate (TOPROL-XL) 25 MG 24 hr tablet TAKE 1/2 (ONE-HALF) TABLET BY MOUTH IN THE MORNING AND 1 TABLET IN THE EVENING     Allergies:   Patient has no known allergies.   Social History   Tobacco Use  . Smoking status: Former Smoker    Packs/day: 1.00    Years: 20.00    Pack years: 20.00    Types: Cigarettes    Quit date: 11/01/1979    Years since quitting: 39.8  . Smokeless tobacco: Never Used  Substance Use Topics  . Alcohol use: Yes    Comment: 11/23/2017 "beer once/month maybe"  . Drug use: Yes    Types: Marijuana    Comment: 11/23/2017 "nothing for years"     Family Hx: The patient's family history includes Diabetes in his mother; Heart attack in his father.  ROS:   Please see the history of present illness.     All other systems reviewed and are negative.   Prior CV studies:   The following studies were reviewed today:  PACEART 12/25/18 Echo 11/25/17 EF 50-55%  Labs/Other Tests and Data Reviewed:    EKG:   07/20/18  afib rate 68 intermittent V pacing   Recent Labs: No results found for requested labs within last 8760 hours.   Recent Lipid Panel Lab Results  Component Value Date/Time   CHOL 193 02/12/2016   TRIG 196 02/12/2016   HDL 29 02/12/2016   CHOLHDL 6.7 02/12/2016   LDLCALC 125 02/12/2016    Wt Readings from Last 3 Encounters:  09/02/19 145 lb (65.8 kg)  03/22/19 121 lb 6.4 oz (55.1 kg)  09/21/18 145 lb (65.8 kg)     Objective:    Vital Signs:  BP (!) 163/87   Pulse (!) 58   Temp (!) 96.6 F (35.9 C) (Temporal)   Ht 5\' 8"  (1.727 m)   Wt 145 lb (65.8 kg)   BMI 22.05 kg/m    Affect appropriate Chronically ill elderly white male  HEENT: normal Neck supple with no adenopathy JVP normal no bruits no thyromegaly Lungs clear with no wheezing and good  diaphragmatic motion Heart:  S1/S2 no murmur, no rub, gallop or click PMI normal Abdomen: benighn, BS positve, no tenderness, no AAA no bruit.  No HSM or HJR Distal pulses intact with no bruits No edema Neuro non-focal Skin warm and dry No muscular weakness   ASSESSMENT & PLAN:    1. Chronic Combined Systolic and Diastolic CHF: most recent EF 11/25/17 TTE  50-55% echo 11/25/17 improved continue medical Rx  2. Persistent Atrial Fibrillation  Failed [facility] 03/2016 continue eliquis .   3. HTN:  Well controlled.  Continue current medications and low sodium Dash type  diet.    4. HLD: on statin labs with primary    6. Pacer:  Normal function by PaceArt f/u Dr Lovena Le   7. Ortho:  F/u Dr Luna Glasgow for x-rays and consider referral back to PT/OT  8. CAD: post stenting mid circumflex 11/23/17 On plavix and eliquis no ASA No angina stable    COVID-19 Education: The signs and symptoms of COVID-19 were discussed with the patient and how to seek care for testing (follow up with PCP or arrange E-visit).  The importance of social distancing was discussed today.  Time:   Today, I have spent 30 minutes with the patient     Medication Adjustments/Labs and Tests Ordered: Current medicines are reviewed at length with the patient today.  Concerns regarding medicines are outlined above.   Tests Ordered:  None   Medication Changes: No orders of the defined types were placed in this encounter.   Disposition:  Follow up in 6 months   Signed, [name], MD  09/02/2019 11:32 AM    [place]

## 2019-09-02 ENCOUNTER — Other Ambulatory Visit: Payer: Self-pay

## 2019-09-02 ENCOUNTER — Ambulatory Visit: Payer: Medicare HMO | Admitting: Cardiovascular Disease

## 2019-09-02 ENCOUNTER — Encounter: Payer: Self-pay | Admitting: Cardiovascular Disease

## 2019-09-02 VITALS — BP 163/87 | HR 58 | Temp 96.6°F | Ht 68.0 in | Wt 145.0 lb

## 2019-09-02 DIAGNOSIS — I2510 Atherosclerotic heart disease of native coronary artery without angina pectoris: Secondary | ICD-10-CM

## 2019-09-02 NOTE — Patient Instructions (Signed)
Medication Instructions:  Your physician recommends that you continue on your current medications as directed. Please refer to the Current Medication list given to you today.   Labwork: none  Testing/Procedures: none  Follow-Up: Your physician recommends that you schedule a follow-up appointment in: December 1 @10 :37  Your physician wants you to follow-up in: [place]. Johnsie Cancel.  You will receive a reminder letter in the mail two months in advance. If you don't receive a letter, please call our office to schedule the follow-up appointment.    Any Other Special Instructions Will Be Listed Below (If Applicable).     If you need a refill on your cardiac medications before your next appointment, please call your pharmacy.

## 2019-09-06 DIAGNOSIS — I959 Hypotension, unspecified: Secondary | ICD-10-CM | POA: Diagnosis not present

## 2019-09-06 DIAGNOSIS — J449 Chronic obstructive pulmonary disease, unspecified: Secondary | ICD-10-CM | POA: Diagnosis not present

## 2019-09-06 DIAGNOSIS — I482 Chronic atrial fibrillation, unspecified: Secondary | ICD-10-CM | POA: Diagnosis not present

## 2019-09-06 DIAGNOSIS — R7301 Impaired fasting glucose: Secondary | ICD-10-CM | POA: Diagnosis not present

## 2019-09-06 DIAGNOSIS — I5032 Chronic diastolic (congestive) heart failure: Secondary | ICD-10-CM | POA: Diagnosis not present

## 2019-09-06 DIAGNOSIS — E782 Mixed hyperlipidemia: Secondary | ICD-10-CM | POA: Diagnosis not present

## 2019-09-24 LAB — CUP PACEART REMOTE DEVICE CHECK
Battery Remaining Longevity: 121 mo
Battery Remaining Percentage: 95.5 %
Battery Voltage: 2.99 V
Brady Statistic AP VP Percent: 0 %
Brady Statistic AP VS Percent: 0 %
Brady Statistic AS VP Percent: 0 %
Brady Statistic AS VS Percent: 0 %
Brady Statistic RA Percent Paced: 0 %
Brady Statistic RV Percent Paced: 29 %
Date Time Interrogation Session: 20201124020013
Implantable Lead Implant Date: 20190128
Implantable Lead Implant Date: 20190128
Implantable Lead Location: 753859
Implantable Lead Location: 753860
Implantable Pulse Generator Implant Date: 20190128
Lead Channel Impedance Value: 540 Ohm
Lead Channel Impedance Value: 560 Ohm
Lead Channel Pacing Threshold Amplitude: 0.75 V
Lead Channel Pacing Threshold Pulse Width: 0.5 ms
Lead Channel Sensing Intrinsic Amplitude: 1.4 mV
Lead Channel Sensing Intrinsic Amplitude: 10.1 mV
Lead Channel Setting Pacing Amplitude: 2 V
Lead Channel Setting Pacing Amplitude: 2.5 V
Lead Channel Setting Pacing Pulse Width: 0.5 ms
Lead Channel Setting Sensing Sensitivity: 2 mV
Pulse Gen Model: 2272
Pulse Gen Serial Number: 8982069

## 2019-09-25 ENCOUNTER — Ambulatory Visit (INDEPENDENT_AMBULATORY_CARE_PROVIDER_SITE_OTHER): Payer: Medicare HMO | Admitting: *Deleted

## 2019-09-25 DIAGNOSIS — I495 Sick sinus syndrome: Secondary | ICD-10-CM

## 2019-10-01 ENCOUNTER — Encounter: Payer: Medicare HMO | Admitting: Internal Medicine

## 2019-10-08 ENCOUNTER — Encounter: Payer: Self-pay | Admitting: Internal Medicine

## 2019-10-08 ENCOUNTER — Ambulatory Visit: Payer: Medicare HMO | Admitting: Internal Medicine

## 2019-10-08 ENCOUNTER — Other Ambulatory Visit: Payer: Self-pay

## 2019-10-08 VITALS — BP 124/84 | HR 62 | Temp 96.9°F | Ht 68.0 in | Wt 141.0 lb

## 2019-10-08 DIAGNOSIS — I4819 Other persistent atrial fibrillation: Secondary | ICD-10-CM

## 2019-10-08 DIAGNOSIS — I10 Essential (primary) hypertension: Secondary | ICD-10-CM | POA: Diagnosis not present

## 2019-10-08 DIAGNOSIS — I5042 Chronic combined systolic (congestive) and diastolic (congestive) heart failure: Secondary | ICD-10-CM | POA: Diagnosis not present

## 2019-10-08 NOTE — Progress Notes (Signed)
HPI [name] returns today for followup. He is a 73 yo man with a h/o sinus node dysfunction and heart block, s/p PPM insertion. He has broken his right leg. He had surgery. He is limited by his right leg with ambulation. He has not had palpitations. He denies anginal symptoms. He notes that his hearing has gotten worse but he does not like to wear his hearing aid.  No Known Allergies   Current Outpatient Medications  Medication Sig Dispense Refill  . apixaban (ELIQUIS) 2.5 MG TABS tablet Take 1 tablet (2.5 mg total) by mouth 2 (two) times daily. 60 tablet 5  . atorvastatin (LIPITOR) 80 MG tablet Take 1 tablet (80 mg total) by mouth daily. 90 tablet 3  . clopidogrel (PLAVIX) 75 MG tablet Take 1 tablet (75 mg total) by mouth daily. 90 tablet 3  . digoxin (LANOXIN) 0.125 MG tablet Take 1 tablet (125 mcg total) by mouth daily. 90 tablet 3  . ezetimibe (ZETIA) 10 MG tablet Take 1 tablet by mouth daily.    . furosemide (LASIX) 40 MG tablet Take 1 tablet (40 mg total) by mouth daily. 30 tablet 6  . HYDROcodone-acetaminophen (NORCO/VICODIN) 5-325 MG tablet One tablet by mouth every six hours as needed for pain. 56 tablet 0  . KLOR-CON M10 10 MEQ tablet Take 1 tablet (10 mEq total) by mouth daily. 90 tablet 2  . lisinopril (ZESTRIL) 40 MG tablet Take 1 tablet by mouth once daily 90 tablet 2  . losartan (COZAAR) 50 MG tablet Take 1 tablet by mouth daily.    . metoprolol succinate (TOPROL-XL) 25 MG 24 hr tablet TAKE 1/2 (ONE-HALF) TABLET BY MOUTH IN THE MORNING AND 1 TABLET IN THE EVENING 135 tablet 3   No current facility-administered medications for this visit.      Past Medical History:  Diagnosis Date  . Arthritis    "finger on right hand" (11/23/2017)  . Broken arm ~ 1952   "no OR; just reset it; not sure which side"  . CAD (coronary artery disease)   . CHF (congestive heart failure) ([place])    a. 12/2015: echo showing reduced EF of 35-40% b. NST: 09/2017: scar with peri-infarct  ischemia, intermediate-risk study  . COPD (chronic obstructive pulmonary disease) ([employer] [place])    "was on RX when he had CHF; not on anything now" (11/23/2017)  . High cholesterol   . HOH (hard of hearing)   . Hypertension   . Persistent atrial fibrillation ([place])    a. s/p DCCV in 03/2016    ROS:   All systems reviewed and negative except as noted in the HPI.   Past Surgical History:  Procedure Laterality Date  . CARDIAC CATHETERIZATION  11/23/2017  . CARDIOVERSION N/A 04/05/2016   Procedure: CARDIOVERSION;  Surgeon: Josue Hector, MD;  [facility];  Service: Cardiovascular;  Laterality: N/A;  . COMPRESSION HIP SCREW Right 03/15/2013   Procedure: COMPRESSION HIP;  Surgeon: Sanjuana Kava, MD;  Location: [facility];  Service: Orthopedics;  Laterality: Right;  . CORONARY ATHERECTOMY N/A 11/24/2017   Procedure: CORONARY ATHERECTOMY;  Surgeon: Burnell Blanks, MD;  Location: [place] [facility];  Service: Cardiovascular;  Laterality: N/A;  . CORONARY BALLOON ANGIOPLASTY N/A 11/24/2017   Procedure: CORONARY BALLOON ANGIOPLASTY;  Surgeon: Burnell Blanks, MD;  Location: [place] [facility];  Service: Cardiovascular;  Laterality: N/A;  . CORONARY STENT INTERVENTION N/A 11/24/2017   Procedure: CORONARY STENT INTERVENTION;  Surgeon: Lauree Chandler  D, MD;  Location: [place] [facility];  Service: Cardiovascular;  Laterality: N/A;  . FRACTURE SURGERY    . PACEMAKER IMPLANT N/A 11/27/2017   Procedure: PACEMAKER IMPLANT;  Surgeon: Evans Lance, MD;  Location: [place] [facility];  Service: Cardiovascular;  Laterality: N/A;  . RIGHT/LEFT HEART CATH AND CORONARY ANGIOGRAPHY N/A 11/23/2017   Procedure: RIGHT/LEFT HEART CATH AND CORONARY ANGIOGRAPHY;  Surgeon: Burnell Blanks, MD;  Location: [facility] [facility];  Service: Cardiovascular;  Laterality: N/A;  . SKIN GRAFT Left    from right thigh  . TONSILLECTOMY       Family History  Problem Relation Age of Onset  .  Diabetes Mother   . Heart attack Father      Social History   Socioeconomic History  . Marital status: Married    Spouse name: Not on file  . Number of children: Not on file  . Years of education: Not on file  . Highest education level: Not on file  Occupational History  . Not on file  Social Needs  . Financial resource strain: Not on file  . Food insecurity    Worry: Not on file    Inability: Not on file  . Transportation needs    Medical: Not on file    Non-medical: Not on file  Tobacco Use  . Smoking status: Former Smoker    Packs/day: 1.00    Years: 20.00    Pack years: 20.00    Types: Cigarettes    Quit date: 11/01/1979    Years since quitting: 39.9  . Smokeless tobacco: Never Used  Substance and Sexual Activity  . Alcohol use: Yes    Comment: 11/23/2017 "beer once/month maybe"  . Drug use: Yes    Types: Marijuana    Comment: 11/23/2017 "nothing for years"  . Sexual activity: Yes    Birth control/protection: None  Lifestyle  . Physical activity    Days per week: Not on file    Minutes per session: Not on file  . Stress: Not on file  Relationships  . Social Herbalist on phone: Not on file    Gets together: Not on file    Attends religious service: Not on file    Active member of club or organization: Not on file    Attends meetings of clubs or organizations: Not on file    Relationship status: Not on file  . Intimate partner violence    Fear of current or ex partner: Not on file    Emotionally abused: Not on file    Physically abused: Not on file    Forced sexual activity: Not on file  Other Topics Concern  . Not on file  Social History Narrative  . Not on file     BP 124/84   Pulse 62   Temp (!) 96.9 F (36.1 C) (Temporal)   Ht 5\' 8"  (1.727 m)   Wt 141 lb (64 kg)   BMI 21.44 kg/m   Physical Exam:  Well appearing NAD HEENT: Unremarkable Neck:  No JVD, no thyromegally Lymphatics:  No adenopathy Back:  No CVA tenderness Lungs:   Clear with no wheezes HEART:  Regular rate rhythm, no murmurs, no rubs, no clicks Abd:  soft, positive bowel sounds, no organomegally, no rebound, no guarding Ext:  2 plus pulses, no edema, no cyanosis, no clubbing Skin:  No rashes no nodules Neuro:  CN II through XII intact, motor grossly intact  DEVICE  Normal device function.  See PaceArt for details. Periods of atrial fib are present  Assess/Plan: 1. PAF - he continues to have episodes. He is minimally symptomatic. 2. Sinus node dysfunction - he is asymptomatic 3. HTN - his bp is well controlled today.  4. Chronic systolic heart failure - His symptoms are class 2. He will continue his current meds.   Mikle Bosworth.D.

## 2019-10-08 NOTE — Patient Instructions (Signed)
Medication Instructions:  Your physician recommends that you continue on your current medications as directed. Please refer to the Current Medication list given to you today.  *If you need a refill on your cardiac medications before your next appointment, please call your pharmacy*  Lab Work: NONE  If you have labs (blood work) drawn today and your tests are completely normal, you will receive your results only by: . MyChart Message (if you have MyChart) OR . A paper copy in the mail If you have any lab test that is abnormal or we need to change your treatment, we will call you to review the results.  Testing/Procedures: NONE   Follow-Up: At [facility], you and your health needs are our priority.  As part of our continuing mission to provide you with exceptional heart care, we have created designated Provider Care Teams.  These Care Teams include your primary Cardiologist (physician) and Advanced Practice Providers (APPs -  Physician Assistants and Nurse Practitioners) who all work together to provide you with the care you need, when you need it.  Your next appointment:   1 year(s)  The format for your next appointment:   In Person  Provider:   Gregg Taylor, MD  Other Instructions Thank you for choosing [facility]!    

## 2019-10-10 DIAGNOSIS — I130 Hypertensive heart and chronic kidney disease with heart failure and stage 1 through stage 4 chronic kidney disease, or unspecified chronic kidney disease: Secondary | ICD-10-CM | POA: Diagnosis not present

## 2019-10-10 DIAGNOSIS — I959 Hypotension, unspecified: Secondary | ICD-10-CM | POA: Diagnosis not present

## 2019-10-10 DIAGNOSIS — R7301 Impaired fasting glucose: Secondary | ICD-10-CM | POA: Diagnosis not present

## 2019-10-10 DIAGNOSIS — J449 Chronic obstructive pulmonary disease, unspecified: Secondary | ICD-10-CM | POA: Diagnosis not present

## 2019-10-10 DIAGNOSIS — E782 Mixed hyperlipidemia: Secondary | ICD-10-CM | POA: Diagnosis not present

## 2019-10-10 DIAGNOSIS — I482 Chronic atrial fibrillation, unspecified: Secondary | ICD-10-CM | POA: Diagnosis not present

## 2019-10-10 DIAGNOSIS — N183 Chronic kidney disease, stage 3 unspecified: Secondary | ICD-10-CM | POA: Diagnosis not present

## 2019-10-10 DIAGNOSIS — I5032 Chronic diastolic (congestive) heart failure: Secondary | ICD-10-CM | POA: Diagnosis not present

## 2019-10-11 DIAGNOSIS — N183 Chronic kidney disease, stage 3 unspecified: Secondary | ICD-10-CM | POA: Diagnosis not present

## 2019-10-11 DIAGNOSIS — R7301 Impaired fasting glucose: Secondary | ICD-10-CM | POA: Diagnosis not present

## 2019-10-11 DIAGNOSIS — E782 Mixed hyperlipidemia: Secondary | ICD-10-CM | POA: Diagnosis not present

## 2019-10-11 DIAGNOSIS — I10 Essential (primary) hypertension: Secondary | ICD-10-CM | POA: Diagnosis not present

## 2019-10-22 NOTE — Progress Notes (Signed)
PPM remote 

## 2019-10-23 DIAGNOSIS — I129 Hypertensive chronic kidney disease with stage 1 through stage 4 chronic kidney disease, or unspecified chronic kidney disease: Secondary | ICD-10-CM | POA: Diagnosis not present

## 2019-10-23 DIAGNOSIS — Z950 Presence of cardiac pacemaker: Secondary | ICD-10-CM | POA: Diagnosis not present

## 2019-10-23 DIAGNOSIS — E875 Hyperkalemia: Secondary | ICD-10-CM | POA: Diagnosis not present

## 2019-10-23 DIAGNOSIS — I482 Chronic atrial fibrillation, unspecified: Secondary | ICD-10-CM | POA: Diagnosis not present

## 2019-10-23 DIAGNOSIS — I2510 Atherosclerotic heart disease of native coronary artery without angina pectoris: Secondary | ICD-10-CM | POA: Diagnosis not present

## 2019-10-23 DIAGNOSIS — I5022 Chronic systolic (congestive) heart failure: Secondary | ICD-10-CM | POA: Diagnosis not present

## 2019-10-23 DIAGNOSIS — I10 Essential (primary) hypertension: Secondary | ICD-10-CM | POA: Diagnosis not present

## 2019-10-23 DIAGNOSIS — E782 Mixed hyperlipidemia: Secondary | ICD-10-CM | POA: Diagnosis not present

## 2019-10-23 DIAGNOSIS — N1832 Chronic kidney disease, stage 3b: Secondary | ICD-10-CM | POA: Diagnosis not present

## 2019-10-23 DIAGNOSIS — G894 Chronic pain syndrome: Secondary | ICD-10-CM | POA: Diagnosis not present

## 2019-11-06 DIAGNOSIS — J449 Chronic obstructive pulmonary disease, unspecified: Secondary | ICD-10-CM | POA: Diagnosis not present

## 2019-11-06 DIAGNOSIS — E782 Mixed hyperlipidemia: Secondary | ICD-10-CM | POA: Diagnosis not present

## 2019-11-06 DIAGNOSIS — N183 Chronic kidney disease, stage 3 unspecified: Secondary | ICD-10-CM | POA: Diagnosis not present

## 2019-11-06 DIAGNOSIS — I959 Hypotension, unspecified: Secondary | ICD-10-CM | POA: Diagnosis not present

## 2019-11-06 DIAGNOSIS — E440 Moderate protein-calorie malnutrition: Secondary | ICD-10-CM | POA: Diagnosis not present

## 2019-11-06 DIAGNOSIS — M1990 Unspecified osteoarthritis, unspecified site: Secondary | ICD-10-CM | POA: Diagnosis not present

## 2019-11-06 DIAGNOSIS — I5032 Chronic diastolic (congestive) heart failure: Secondary | ICD-10-CM | POA: Diagnosis not present

## 2019-11-06 DIAGNOSIS — I482 Chronic atrial fibrillation, unspecified: Secondary | ICD-10-CM | POA: Diagnosis not present

## 2019-11-06 DIAGNOSIS — R7301 Impaired fasting glucose: Secondary | ICD-10-CM | POA: Diagnosis not present

## 2019-11-06 DIAGNOSIS — I509 Heart failure, unspecified: Secondary | ICD-10-CM | POA: Diagnosis not present

## 2019-11-11 DIAGNOSIS — E875 Hyperkalemia: Secondary | ICD-10-CM | POA: Diagnosis not present

## 2019-11-11 DIAGNOSIS — I129 Hypertensive chronic kidney disease with stage 1 through stage 4 chronic kidney disease, or unspecified chronic kidney disease: Secondary | ICD-10-CM | POA: Diagnosis not present

## 2019-11-21 ENCOUNTER — Other Ambulatory Visit: Payer: Self-pay | Admitting: Nephrology

## 2019-11-21 ENCOUNTER — Other Ambulatory Visit (HOSPITAL_COMMUNITY): Payer: Self-pay | Admitting: Nephrology

## 2019-11-21 DIAGNOSIS — I129 Hypertensive chronic kidney disease with stage 1 through stage 4 chronic kidney disease, or unspecified chronic kidney disease: Secondary | ICD-10-CM | POA: Diagnosis not present

## 2019-11-21 DIAGNOSIS — D472 Monoclonal gammopathy: Secondary | ICD-10-CM

## 2019-11-21 DIAGNOSIS — E875 Hyperkalemia: Secondary | ICD-10-CM | POA: Diagnosis not present

## 2019-11-21 DIAGNOSIS — R809 Proteinuria, unspecified: Secondary | ICD-10-CM | POA: Diagnosis not present

## 2019-11-21 DIAGNOSIS — E211 Secondary hyperparathyroidism, not elsewhere classified: Secondary | ICD-10-CM | POA: Diagnosis not present

## 2019-11-21 DIAGNOSIS — I5022 Chronic systolic (congestive) heart failure: Secondary | ICD-10-CM | POA: Diagnosis not present

## 2019-11-28 DIAGNOSIS — E559 Vitamin D deficiency, unspecified: Secondary | ICD-10-CM | POA: Diagnosis not present

## 2019-11-28 DIAGNOSIS — Z79899 Other long term (current) drug therapy: Secondary | ICD-10-CM | POA: Diagnosis not present

## 2019-11-28 DIAGNOSIS — N1832 Chronic kidney disease, stage 3b: Secondary | ICD-10-CM | POA: Diagnosis not present

## 2019-11-28 DIAGNOSIS — E875 Hyperkalemia: Secondary | ICD-10-CM | POA: Diagnosis not present

## 2019-11-28 DIAGNOSIS — E211 Secondary hyperparathyroidism, not elsewhere classified: Secondary | ICD-10-CM | POA: Diagnosis not present

## 2019-11-28 DIAGNOSIS — I129 Hypertensive chronic kidney disease with stage 1 through stage 4 chronic kidney disease, or unspecified chronic kidney disease: Secondary | ICD-10-CM | POA: Diagnosis not present

## 2019-11-28 DIAGNOSIS — R809 Proteinuria, unspecified: Secondary | ICD-10-CM | POA: Diagnosis not present

## 2019-11-28 DIAGNOSIS — I5022 Chronic systolic (congestive) heart failure: Secondary | ICD-10-CM | POA: Diagnosis not present

## 2019-11-28 DIAGNOSIS — D472 Monoclonal gammopathy: Secondary | ICD-10-CM | POA: Diagnosis not present

## 2019-11-29 ENCOUNTER — Ambulatory Visit (HOSPITAL_COMMUNITY)
Admission: RE | Admit: 2019-11-29 | Discharge: 2019-11-29 | Disposition: A | Discharge status: Home / Self Care | Payer: Medicare HMO | Source: Ambulatory Visit | Attending: Nephrology | Admitting: Nephrology

## 2019-11-29 ENCOUNTER — Other Ambulatory Visit: Payer: Self-pay

## 2019-11-29 DIAGNOSIS — I739 Peripheral vascular disease, unspecified: Secondary | ICD-10-CM | POA: Diagnosis not present

## 2019-11-29 DIAGNOSIS — I110 Hypertensive heart disease with heart failure: Secondary | ICD-10-CM | POA: Diagnosis not present

## 2019-11-29 DIAGNOSIS — I4891 Unspecified atrial fibrillation: Secondary | ICD-10-CM | POA: Diagnosis not present

## 2019-11-29 DIAGNOSIS — Z008 Encounter for other general examination: Secondary | ICD-10-CM | POA: Diagnosis not present

## 2019-11-29 DIAGNOSIS — G3184 Mild cognitive impairment, so stated: Secondary | ICD-10-CM | POA: Diagnosis not present

## 2019-11-29 DIAGNOSIS — R809 Proteinuria, unspecified: Secondary | ICD-10-CM | POA: Diagnosis not present

## 2019-11-29 DIAGNOSIS — D472 Monoclonal gammopathy: Secondary | ICD-10-CM | POA: Diagnosis not present

## 2019-11-29 DIAGNOSIS — I2510 Atherosclerotic heart disease of native coronary artery without angina pectoris: Secondary | ICD-10-CM | POA: Diagnosis not present

## 2019-11-29 DIAGNOSIS — I509 Heart failure, unspecified: Secondary | ICD-10-CM | POA: Diagnosis not present

## 2019-11-29 DIAGNOSIS — G8929 Other chronic pain: Secondary | ICD-10-CM | POA: Diagnosis not present

## 2019-11-29 DIAGNOSIS — D6869 Other thrombophilia: Secondary | ICD-10-CM | POA: Diagnosis not present

## 2019-11-29 DIAGNOSIS — I129 Hypertensive chronic kidney disease with stage 1 through stage 4 chronic kidney disease, or unspecified chronic kidney disease: Secondary | ICD-10-CM

## 2019-11-29 DIAGNOSIS — E875 Hyperkalemia: Secondary | ICD-10-CM | POA: Diagnosis not present

## 2019-11-29 DIAGNOSIS — J449 Chronic obstructive pulmonary disease, unspecified: Secondary | ICD-10-CM | POA: Diagnosis not present

## 2019-11-29 DIAGNOSIS — E785 Hyperlipidemia, unspecified: Secondary | ICD-10-CM | POA: Diagnosis not present

## 2019-12-12 DIAGNOSIS — J449 Chronic obstructive pulmonary disease, unspecified: Secondary | ICD-10-CM | POA: Diagnosis not present

## 2019-12-12 DIAGNOSIS — E7849 Other hyperlipidemia: Secondary | ICD-10-CM | POA: Diagnosis not present

## 2019-12-12 DIAGNOSIS — N183 Chronic kidney disease, stage 3 unspecified: Secondary | ICD-10-CM | POA: Diagnosis not present

## 2019-12-12 DIAGNOSIS — I5032 Chronic diastolic (congestive) heart failure: Secondary | ICD-10-CM | POA: Diagnosis not present

## 2019-12-12 DIAGNOSIS — R7301 Impaired fasting glucose: Secondary | ICD-10-CM | POA: Diagnosis not present

## 2019-12-12 DIAGNOSIS — I482 Chronic atrial fibrillation, unspecified: Secondary | ICD-10-CM | POA: Diagnosis not present

## 2019-12-12 DIAGNOSIS — I959 Hypotension, unspecified: Secondary | ICD-10-CM | POA: Diagnosis not present

## 2019-12-25 ENCOUNTER — Ambulatory Visit (INDEPENDENT_AMBULATORY_CARE_PROVIDER_SITE_OTHER): Payer: Medicare HMO | Admitting: *Deleted

## 2019-12-25 DIAGNOSIS — I495 Sick sinus syndrome: Secondary | ICD-10-CM | POA: Diagnosis not present

## 2020-01-03 DIAGNOSIS — I482 Chronic atrial fibrillation, unspecified: Secondary | ICD-10-CM | POA: Diagnosis not present

## 2020-01-03 DIAGNOSIS — J449 Chronic obstructive pulmonary disease, unspecified: Secondary | ICD-10-CM | POA: Diagnosis not present

## 2020-01-03 DIAGNOSIS — E782 Mixed hyperlipidemia: Secondary | ICD-10-CM | POA: Diagnosis not present

## 2020-01-03 DIAGNOSIS — D472 Monoclonal gammopathy: Secondary | ICD-10-CM | POA: Diagnosis not present

## 2020-01-03 DIAGNOSIS — E211 Secondary hyperparathyroidism, not elsewhere classified: Secondary | ICD-10-CM | POA: Diagnosis not present

## 2020-01-03 DIAGNOSIS — N183 Chronic kidney disease, stage 3 unspecified: Secondary | ICD-10-CM | POA: Diagnosis not present

## 2020-01-03 DIAGNOSIS — Z79899 Other long term (current) drug therapy: Secondary | ICD-10-CM | POA: Diagnosis not present

## 2020-01-03 DIAGNOSIS — I959 Hypotension, unspecified: Secondary | ICD-10-CM | POA: Diagnosis not present

## 2020-01-03 DIAGNOSIS — I129 Hypertensive chronic kidney disease with stage 1 through stage 4 chronic kidney disease, or unspecified chronic kidney disease: Secondary | ICD-10-CM | POA: Diagnosis not present

## 2020-01-03 DIAGNOSIS — R7301 Impaired fasting glucose: Secondary | ICD-10-CM | POA: Diagnosis not present

## 2020-01-03 DIAGNOSIS — E875 Hyperkalemia: Secondary | ICD-10-CM | POA: Diagnosis not present

## 2020-01-03 DIAGNOSIS — I5022 Chronic systolic (congestive) heart failure: Secondary | ICD-10-CM | POA: Diagnosis not present

## 2020-01-03 DIAGNOSIS — R809 Proteinuria, unspecified: Secondary | ICD-10-CM | POA: Diagnosis not present

## 2020-01-03 DIAGNOSIS — R7303 Prediabetes: Secondary | ICD-10-CM | POA: Diagnosis not present

## 2020-01-03 DIAGNOSIS — I5032 Chronic diastolic (congestive) heart failure: Secondary | ICD-10-CM | POA: Diagnosis not present

## 2020-01-06 LAB — CUP PACEART REMOTE DEVICE CHECK
Battery Remaining Longevity: 127 mo
Battery Remaining Percentage: 95.5 %
Battery Voltage: 3.01 V
Brady Statistic RV Percent Paced: 43 %
Date Time Interrogation Session: 20210225042630
Implantable Lead Implant Date: 20190128
Implantable Lead Implant Date: 20190128
Implantable Lead Location: 753859
Implantable Lead Location: 753860
Implantable Pulse Generator Implant Date: 20190128
Lead Channel Impedance Value: 540 Ohm
Lead Channel Pacing Threshold Amplitude: 0.75 V
Lead Channel Pacing Threshold Pulse Width: 0.5 ms
Lead Channel Sensing Intrinsic Amplitude: 9.1 mV
Lead Channel Setting Pacing Amplitude: 2.5 V
Lead Channel Setting Pacing Pulse Width: 0.5 ms
Lead Channel Setting Sensing Sensitivity: 2 mV
Pulse Gen Model: 2272
Pulse Gen Serial Number: 8982069

## 2020-01-06 NOTE — Progress Notes (Signed)
PPM Remote  

## 2020-01-14 ENCOUNTER — Inpatient Hospital Stay (HOSPITAL_COMMUNITY): Payer: Medicare HMO

## 2020-01-14 ENCOUNTER — Encounter (HOSPITAL_COMMUNITY): Payer: Self-pay | Admitting: Hematology

## 2020-01-14 ENCOUNTER — Encounter (HOSPITAL_COMMUNITY): Payer: Self-pay

## 2020-01-14 ENCOUNTER — Ambulatory Visit (HOSPITAL_COMMUNITY)
Admission: RE | Admit: 2020-01-14 | Discharge: 2020-01-14 | Disposition: A | Discharge status: Home / Self Care | Payer: Medicare HMO | Source: Ambulatory Visit | Attending: Hematology | Admitting: Hematology

## 2020-01-14 ENCOUNTER — Other Ambulatory Visit: Payer: Self-pay

## 2020-01-14 ENCOUNTER — Inpatient Hospital Stay (HOSPITAL_COMMUNITY): Payer: Medicare HMO | Attending: Hematology | Admitting: Hematology

## 2020-01-14 DIAGNOSIS — I130 Hypertensive heart and chronic kidney disease with heart failure and stage 1 through stage 4 chronic kidney disease, or unspecified chronic kidney disease: Secondary | ICD-10-CM

## 2020-01-14 DIAGNOSIS — Z79899 Other long term (current) drug therapy: Secondary | ICD-10-CM | POA: Diagnosis not present

## 2020-01-14 DIAGNOSIS — D472 Monoclonal gammopathy: Secondary | ICD-10-CM

## 2020-01-14 DIAGNOSIS — Z7901 Long term (current) use of anticoagulants: Secondary | ICD-10-CM | POA: Insufficient documentation

## 2020-01-14 DIAGNOSIS — Z87891 Personal history of nicotine dependence: Secondary | ICD-10-CM

## 2020-01-14 DIAGNOSIS — I509 Heart failure, unspecified: Secondary | ICD-10-CM

## 2020-01-14 DIAGNOSIS — N189 Chronic kidney disease, unspecified: Secondary | ICD-10-CM | POA: Diagnosis not present

## 2020-01-14 HISTORY — DX: Monoclonal gammopathy: D47.2

## 2020-01-14 LAB — BASIC METABOLIC PANEL
Anion gap: 10 (ref 5–15)
BUN: 41 mg/dL — ABNORMAL HIGH (ref 8–23)
CO2: 24 mmol/L (ref 22–32)
Calcium: 8.9 mg/dL (ref 8.9–10.3)
Chloride: 102 mmol/L (ref 98–111)
Creatinine, Ser: 1.88 mg/dL — ABNORMAL HIGH (ref 0.61–1.24)
GFR calc Af Amer: 40 mL/min — ABNORMAL LOW (ref >60)
GFR calc non Af Amer: 34 mL/min — ABNORMAL LOW (ref >60)
Glucose, Bld: 115 mg/dL — ABNORMAL HIGH (ref 70–99)
Potassium: 4.5 mmol/L (ref 3.5–5.1)
Sodium: 136 mmol/L (ref 135–145)

## 2020-01-14 LAB — LACTATE DEHYDROGENASE: LDH: 135 U/L (ref 98–192)

## 2020-01-14 NOTE — Progress Notes (Signed)
CONSULT NOTE  Patient Care Team: [name], MD as PCP - General (Internal Medicine) Evans Lance, MD as PCP - Electrophysiology (Cardiology) Josue Hector, MD as PCP - Cardiology (Cardiology) Donetta Potts, RN as Oncology Nurse Navigator (Oncology) Derek Jack, MD as Medical Oncologist (Oncology)  CHIEF COMPLAINTS/PURPOSE OF CONSULTATION:  Monoclonal gammopathy.  HISTORY OF PRESENTING ILLNESS:  Ricardo Garrett 74 y.o. male is seen in consultation today for further work-up and management of monoclonal gammopathy at the request of Dr. Theador Hawthorne.  CKD work-up showed M spike of 0.4 g on 11/28/2019.  Kappa light chains were elevated at 45.  Lambda light chains were 14.9.  Ratio was elevated at 3.02.  Creatinine was 1.78 and hemoglobin was 14.4.  White count and platelets were normal.  Calcium was 9.2 and albumin 3.6.  Ferritin was 197 and percent saturation of 25.  24-hour urine showed total protein of 2.4 g.  M spike was positive with 50 mg of monoclonal protein.  He reports chronic right leg pain from prior fracture.  Reported about 5 pound weight loss in the last 6 months.  Patient is hard of hearing but does not like to wear his hearing aid.  Some of the history is obtained from the wife.  No personal history of malignancy.  He worked as a Nature conservation officer prior to retirement.  He quit smoking about 30 to 35 years ago.  He smoked 1 pack/day for 20 to 30 years.  No family history of malignancies.  He reports appetite of 100% and energy levels of 25%.  Pain in the right hip and leg rated as 1 out of 10.  Chronic numbness in the fingers also reported.  MEDICAL HISTORY:  Past Medical History:  Diagnosis Date  . Arthritis    "finger on right hand" (11/23/2017)  . Broken arm ~ 1952   "no OR; just reset it; not sure which side"  . CAD (coronary artery disease)   . CHF (congestive heart failure) ([place])    a. 12/2015: echo showing reduced EF of 35-40% b. NST: 09/2017: scar with  peri-infarct ischemia, intermediate-risk study  . COPD (chronic obstructive pulmonary disease) ([place])    "was on RX when he had CHF; not on anything now" (11/23/2017)  . High cholesterol   . HOH (hard of hearing)   . Hypertension   . Persistent atrial fibrillation ([place])    a. s/p DCCV in 03/2016    SURGICAL HISTORY: Past Surgical History:  Procedure Laterality Date  . CARDIAC CATHETERIZATION  11/23/2017  . CARDIOVERSION N/A 04/05/2016   Procedure: CARDIOVERSION;  Surgeon: Josue Hector, MD;  [facility];  Service: Cardiovascular;  Laterality: N/A;  . COMPRESSION HIP SCREW Right 03/15/2013   Procedure: COMPRESSION HIP;  Surgeon: Sanjuana Kava, MD;  Location: [facility] ORS;  Service: Orthopedics;  Laterality: Right;  . CORONARY ATHERECTOMY N/A 11/24/2017   Procedure: CORONARY ATHERECTOMY;  Surgeon: Burnell Blanks, MD;  Location: [place] CV LAB;  Service: Cardiovascular;  Laterality: N/A;  . CORONARY BALLOON ANGIOPLASTY N/A 11/24/2017   Procedure: CORONARY BALLOON ANGIOPLASTY;  Surgeon: Burnell Blanks, MD;  Location: [place] CV LAB;  Service: Cardiovascular;  Laterality: N/A;  . CORONARY STENT INTERVENTION N/A 11/24/2017   Procedure: CORONARY STENT INTERVENTION;  Surgeon: Burnell Blanks, MD;  Location: [place] CV LAB;  Service: Cardiovascular;  Laterality: N/A;  . FRACTURE SURGERY    . PACEMAKER IMPLANT N/A 11/27/2017   Procedure: PACEMAKER IMPLANT;  Surgeon: Evans Lance, MD;  Location: [place] CV LAB;  Service: Cardiovascular;  Laterality: N/A;  . RIGHT/LEFT HEART CATH AND CORONARY ANGIOGRAPHY N/A 11/23/2017   Procedure: RIGHT/LEFT HEART CATH AND CORONARY ANGIOGRAPHY;  Surgeon: Burnell Blanks, MD;  Location: [place] [facility] LAB;  Service: Cardiovascular;  Laterality: N/A;  . SKIN GRAFT Left    from right thigh  . TONSILLECTOMY      SOCIAL HISTORY: Social History   Socioeconomic History  . Marital status: Married    Spouse name:  Not on file  . Number of children: Not on file  . Years of education: Not on file  . Highest education level: Not on file  Occupational History  . Not on file  Tobacco Use  . Smoking status: Former Smoker    Packs/day: 1.00    Years: 20.00    Pack years: 20.00    Types: Cigarettes    Quit date: 11/01/1979    Years since quitting: 40.2  . Smokeless tobacco: Never Used  Substance and Sexual Activity  . Alcohol use: Yes    Comment: 11/23/2017 "beer once/month maybe"  . Drug use: Yes    Types: Marijuana    Comment: 11/23/2017 "nothing for years"  . Sexual activity: Yes    Birth control/protection: None  Other Topics Concern  . Not on file  Social History Narrative  . Not on file   Social Determinants of Health   Financial Resource Strain: Low Risk   . Difficulty of Paying Living Expenses: Not very hard  Food Insecurity: No Food Insecurity  . Worried About Charity fundraiser in the Last Year: Never true  . Ran Out of Food in the Last Year: Never true  Transportation Needs: No Transportation Needs  . Lack of Transportation (Medical): No  . Lack of Transportation (Non-Medical): No  Physical Activity: Inactive  . Days of Exercise per Week: 0 days  . Minutes of Exercise per Session: 0 min  Stress: No Stress Concern Present  . Feeling of Stress : Not at all  Social Connections: Somewhat Isolated  . Frequency of Communication with Friends and Family: Twice a week  . Frequency of Social Gatherings with Friends and Family: Twice a week  . Attends Religious Services: Never  . Active Member of Clubs or Organizations: No  . Attends Archivist Meetings: Never  . Marital Status: Married  Human resources officer Violence: Unknown  . Fear of Current or Ex-Partner: Patient refused  . Emotionally Abused: Patient refused  . Physically Abused: Patient refused  . Sexually Abused: Patient refused    FAMILY HISTORY: Family History  Problem Relation Age of Onset  . Diabetes Mother    . Heart attack Father     ALLERGIES:  has No Known Allergies.  MEDICATIONS:  Current Outpatient Medications  Medication Sig Dispense Refill  . alfuzosin (UROXATRAL) 10 MG 24 hr tablet Take by mouth.    Marland Kitchen apixaban (ELIQUIS) 2.5 MG TABS tablet Take 1 tablet (2.5 mg total) by mouth 2 (two) times daily. 60 tablet 5  . atorvastatin (LIPITOR) 80 MG tablet Take 1 tablet (80 mg total) by mouth daily. 90 tablet 3  . digoxin (LANOXIN) 0.125 MG tablet Take 1 tablet (125 mcg total) by mouth daily. 90 tablet 3  . ezetimibe (ZETIA) 10 MG tablet Take 1 tablet by mouth daily.    . furosemide (LASIX) 40 MG tablet Take 1 tablet (40 mg total) by mouth daily. 30 tablet 6  .  HYDROcodone-acetaminophen (NORCO/VICODIN) 5-325 MG tablet One tablet by mouth every six hours as needed for pain. 56 tablet 0  . KLOR-CON M10 10 MEQ tablet Take 1 tablet (10 mEq total) by mouth daily. 90 tablet 2  . lisinopril (ZESTRIL) 40 MG tablet Take 1 tablet by mouth once daily 90 tablet 2  . losartan (COZAAR) 50 MG tablet Take 1 tablet by mouth daily.    . metoprolol succinate (TOPROL-XL) 25 MG 24 hr tablet TAKE 1/2 (ONE-HALF) TABLET BY MOUTH IN THE MORNING AND 1 TABLET IN THE EVENING 135 tablet 3  . rosuvastatin (CRESTOR) 40 MG tablet Take by mouth.    . sodium zirconium cyclosilicate (LOKELMA) 5 g packet Take by mouth.    . tamsulosin (FLOMAX) 0.4 MG CAPS capsule Take by mouth.    . clopidogrel (PLAVIX) 75 MG tablet Take by mouth.     No current facility-administered medications for this visit.    REVIEW OF SYSTEMS:   Constitutional: Denies fevers, chills or abnormal night sweats Eyes: Denies blurriness of vision, double vision or watery eyes Ears, nose, mouth, throat, and face: Denies mucositis or sore throat Respiratory: Denies cough, dyspnea or wheezes Cardiovascular: Denies palpitation, chest discomfort or lower extremity swelling Gastrointestinal:  Denies nausea, heartburn or change in bowel habits Skin: Denies  abnormal skin rashes Lymphatics: Denies new lymphadenopathy or easy bruising Neurological: Positive for numbness in the fingers. Behavioral/Psych: Mood is stable, no new changes  All other systems were reviewed with the patient and are negative.  PHYSICAL EXAMINATION: ECOG PERFORMANCE STATUS: 1 - Symptomatic but completely ambulatory  Vitals:   01/14/20 1300  BP: 112/69  Pulse: 87  Resp: 18  Temp: (!) 97.3 F (36.3 C)  SpO2: 96%   Filed Weights   01/14/20 1300  Weight: 138 lb 1 oz (62.6 kg)    GENERAL:alert, no distress and comfortable SKIN: skin color, texture, turgor are normal, no rashes or significant lesions EYES: normal, conjunctiva are pink and non-injected, sclera clear OROPHARYNX:no exudate, no erythema and lips, buccal mucosa, and tongue normal  NECK: supple, thyroid normal size, non-tender, without nodularity LYMPH:  no palpable lymphadenopathy in the cervical, axillary or inguinal LUNGS: clear to auscultation and percussion with normal breathing effort HEART: regular rate & rhythm and no murmurs and no lower extremity edema ABDOMEN:abdomen soft, non-tender and normal bowel sounds Musculoskeletal:no cyanosis of digits and no clubbing  PSYCH: alert & oriented x 3 with fluent speech NEURO: no focal motor/sensory deficits  LABORATORY DATA:  I have reviewed the data as listed Recent Results (from the past 2160 hour(s))  CUP PACEART REMOTE DEVICE CHECK     Status: None   Collection Time: 12/26/19  4:26 AM  Result Value Ref Range   Date Time Interrogation Session [id number]    Pulse Generator Manufacturer [employer]    Pulse Gen Model 2272 Assurity MRI    Pulse Gen Serial Number [id number]    Clinic [facility]    Implantable Pulse Generator Type Implantable Pulse Generator    Implantable Pulse Generator Implant Date HN:[id number]    Implantable Lead Manufacturer [facility]    Implantable Lead Model LPA1200M Tendril MRI    Implantable Lead Serial Number [id number]     Implantable Lead Implant Date HN:[id number]    Implantable Lead Location Detail 1 UNKNOWN    Implantable Lead Location [id number]    Implantable Lead Manufacturer [facility]    Implantable Lead Model LPA1200M Tendril MRI    Implantable Lead Serial Number [id number]  Implantable Lead Implant Date HN:[id number]    Implantable Lead Location Detail 1 UNKNOWN    Implantable Lead Location [phone]    Lead Channel Setting Sensing Sensitivity 2.0 mV   Lead Channel Setting Sensing Adaptation Mode Fixed Pacing    Lead Channel Setting Pacing Pulse Width 0.5 ms   Lead Channel Setting Pacing Amplitude 2.5 V   Lead Channel Status NULL    Lead Channel Impedance Value 540 ohm   Lead Channel Sensing Intrinsic Amplitude 9.1 mV   Lead Channel Pacing Threshold Amplitude 0.75 V   Lead Channel Pacing Threshold Pulse Width 0.5 ms   Battery Status MOS    Battery Remaining Longevity 127 mo   Battery Remaining Percentage 95.5 %   Battery Voltage 3.01 V   Brady Statistic RV Percent Paced 43.0 %    RADIOGRAPHIC STUDIES: I have personally reviewed the radiological images as listed and agreed with the findings in the report.   ASSESSMENT & PLAN:  Monoclonal gammopathy 1.  Monoclonal gammopathy: -Patient evaluated at the request of Dr. Theador Hawthorne for monoclonal gammopathy. -Labs on 11/28/2019 showed M spike of 0.48 g.  Kappa light chains were 45, lambda light chains 14.9 and ratio of 3.02. -Creatinine was 1.78.  Hemoglobin was 14.4.  Calcium was 9.2. -24-hour urine showed protein 2.4 g.  M spike was positive.  50 mg of monoclonal protein was seen. -Ultrasound of the kidneys on 11/29/2019 shows normal kidneys, normal-appearing bladder and partially visualized prostate is prominent in size. -Denies any B symptoms.  Has right leg pain from fracture.  He is hard of hearing.  Wife reports that he lost about 5 pounds in the last 6 months. -We talked about monoclonal gammopathy in detail.  I have recommended repeating SPEP, free light chains  and check serum immunofixation.  We will also send LDH and beta-2 microglobulin.  We will also check his basic metabolic panel.  I have recommended checking a skeletal survey to evaluate for any lytic lesions. -I will see him back in 2 to 3 weeks for follow-up.  2.  Tobacco abuse: -He quit smoking about 30 years ago.  He smoked 1 pack/day for 20 to 30 years. -He worked as a Nature conservation officer. -CT scan of the chest without contrast in January 2017 showed bilateral small pulmonary nodules, largest measuring 5 mm. -We will consider follow-up of these findings.     All questions were answered. The patient knows to call the clinic with any problems, questions or concerns.      Derek Jack, MD 01/14/20 2:42 PM

## 2020-01-14 NOTE — Patient Instructions (Signed)
[place] at [facility] Discharge Instructions  You were seen today by Dr. Delton Coombes. He went over your history, family history and how you've been feeling lately. You are being seeing today due to an abnormal amount of protein found in your blood. You will have blood drawn today before you leave the hospital. He will schedule for a skeletal survey. He will see you back in 3 weeks for follow up.   Thank you for choosing [place] at [facility] to provide your oncology and hematology care.  To afford each patient quality time with our provider, please arrive at least 15 minutes before your scheduled appointment time.   If you have a lab appointment with the [facility] please come in thru the  Main Entrance and check in at the main information desk  You need to re-schedule your appointment should you arrive 10 or more minutes late.  We strive to give you quality time with our providers, and arriving late affects you and other patients whose appointments are after yours.  Also, if you no show three or more times for appointments you may be dismissed from the clinic at the providers discretion.     Again, thank you for choosing [facility].  Our hope is that these requests will decrease the amount of time that you wait before being seen by our physicians.       _____________________________________________________________  Should you have questions after your visit to [facility], please contact our office at [phone] between the hours of 8:00 a.m. and 4:30 p.m.  Voicemails left after 4:00 p.m. will not be returned until the following business day.  For prescription refill requests, have your pharmacy contact our office and allow 72 hours.    Cancer Center Support Programs:   > Cancer Support Group  2nd Tuesday of the month 1pm-2pm, Journey Room

## 2020-01-14 NOTE — Assessment & Plan Note (Signed)
1.  Monoclonal gammopathy: -Patient evaluated at the request of Dr. Theador Hawthorne for monoclonal gammopathy. -Labs on 11/28/2019 showed M spike of 0.48 g.  Kappa light chains were 45, lambda light chains 14.9 and ratio of 3.02. -Creatinine was 1.78.  Hemoglobin was 14.4.  Calcium was 9.2. -24-hour urine showed protein 2.4 g.  M spike was positive.  50 mg of monoclonal protein was seen. -Ultrasound of the kidneys on 11/29/2019 shows normal kidneys, normal-appearing bladder and partially visualized prostate is prominent in size. -Denies any B symptoms.  Has right leg pain from fracture.  He is hard of hearing.  Wife reports that he lost about 5 pounds in the last 6 months. -We talked about monoclonal gammopathy in detail.  I have recommended repeating SPEP, free light chains and check serum immunofixation.  We will also send LDH and beta-2 microglobulin.  We will also check his basic metabolic panel.  I have recommended checking a skeletal survey to evaluate for any lytic lesions. -I will see him back in 2 to 3 weeks for follow-up.  2.  Tobacco abuse: -He quit smoking about 30 years ago.  He smoked 1 pack/day for 20 to 30 years. -He worked as a Nature conservation officer. -CT scan of the chest without contrast in January 2017 showed bilateral small pulmonary nodules, largest measuring 5 mm. -We will consider follow-up of these findings.

## 2020-01-15 LAB — KAPPA/LAMBDA LIGHT CHAINS
Kappa free light chain: 44.8 mg/L — ABNORMAL HIGH (ref 3.3–19.4)
Kappa, lambda light chain ratio: 2.8 — ABNORMAL HIGH (ref 0.26–1.65)
Lambda free light chains: 16 mg/L (ref 5.7–26.3)

## 2020-01-15 LAB — BETA 2 MICROGLOBULIN, SERUM: Beta-2 Microglobulin: 5.4 mg/L — ABNORMAL HIGH (ref 0.6–2.4)

## 2020-01-16 DIAGNOSIS — M1990 Unspecified osteoarthritis, unspecified site: Secondary | ICD-10-CM | POA: Diagnosis not present

## 2020-01-16 DIAGNOSIS — N183 Chronic kidney disease, stage 3 unspecified: Secondary | ICD-10-CM | POA: Diagnosis not present

## 2020-01-16 DIAGNOSIS — I482 Chronic atrial fibrillation, unspecified: Secondary | ICD-10-CM | POA: Diagnosis not present

## 2020-01-16 DIAGNOSIS — I959 Hypotension, unspecified: Secondary | ICD-10-CM | POA: Diagnosis not present

## 2020-01-16 DIAGNOSIS — E440 Moderate protein-calorie malnutrition: Secondary | ICD-10-CM | POA: Diagnosis not present

## 2020-01-16 DIAGNOSIS — I5032 Chronic diastolic (congestive) heart failure: Secondary | ICD-10-CM | POA: Diagnosis not present

## 2020-01-16 DIAGNOSIS — R7301 Impaired fasting glucose: Secondary | ICD-10-CM | POA: Diagnosis not present

## 2020-01-16 DIAGNOSIS — E782 Mixed hyperlipidemia: Secondary | ICD-10-CM | POA: Diagnosis not present

## 2020-01-16 DIAGNOSIS — J449 Chronic obstructive pulmonary disease, unspecified: Secondary | ICD-10-CM | POA: Diagnosis not present

## 2020-01-16 DIAGNOSIS — I509 Heart failure, unspecified: Secondary | ICD-10-CM | POA: Diagnosis not present

## 2020-01-16 LAB — PROTEIN ELECTROPHORESIS, SERUM
A/G Ratio: 1.3 (ref 0.7–1.7)
Albumin ELP: 3.6 g/dL (ref 2.9–4.4)
Alpha-1-Globulin: 0.2 g/dL (ref 0.0–0.4)
Alpha-2-Globulin: 0.8 g/dL (ref 0.4–1.0)
Beta Globulin: 0.8 g/dL (ref 0.7–1.3)
Gamma Globulin: 0.9 g/dL (ref 0.4–1.8)
Globulin, Total: 2.7 g/dL (ref 2.2–3.9)
M-Spike, %: 0.5 g/dL — ABNORMAL HIGH
Total Protein ELP: 6.3 g/dL (ref 6.0–8.5)

## 2020-01-16 LAB — IMMUNOFIXATION ELECTROPHORESIS
IgA: 46 mg/dL — ABNORMAL LOW (ref 61–437)
IgG (Immunoglobin G), Serum: 1033 mg/dL (ref 603–1613)
IgM (Immunoglobulin M), Srm: 32 mg/dL (ref 15–143)
Total Protein ELP: 6.4 g/dL (ref 6.0–8.5)

## 2020-01-20 DIAGNOSIS — I482 Chronic atrial fibrillation, unspecified: Secondary | ICD-10-CM | POA: Diagnosis not present

## 2020-01-20 DIAGNOSIS — E875 Hyperkalemia: Secondary | ICD-10-CM | POA: Diagnosis not present

## 2020-01-20 DIAGNOSIS — I129 Hypertensive chronic kidney disease with stage 1 through stage 4 chronic kidney disease, or unspecified chronic kidney disease: Secondary | ICD-10-CM | POA: Diagnosis not present

## 2020-01-20 DIAGNOSIS — N1832 Chronic kidney disease, stage 3b: Secondary | ICD-10-CM | POA: Diagnosis not present

## 2020-01-25 DIAGNOSIS — J302 Other seasonal allergic rhinitis: Secondary | ICD-10-CM | POA: Diagnosis not present

## 2020-01-25 DIAGNOSIS — G894 Chronic pain syndrome: Secondary | ICD-10-CM | POA: Diagnosis not present

## 2020-01-25 DIAGNOSIS — Z20828 Contact with and (suspected) exposure to other viral communicable diseases: Secondary | ICD-10-CM | POA: Diagnosis not present

## 2020-01-27 ENCOUNTER — Other Ambulatory Visit (HOSPITAL_COMMUNITY): Payer: Self-pay | Admitting: *Deleted

## 2020-01-27 DIAGNOSIS — D472 Monoclonal gammopathy: Secondary | ICD-10-CM

## 2020-01-30 ENCOUNTER — Telehealth: Payer: Self-pay

## 2020-01-30 NOTE — Telephone Encounter (Signed)
Left message for Ricardo Garrett that patient has PAF and is on Eliquis for that reason

## 2020-01-30 NOTE — Telephone Encounter (Signed)
Per [place] Constable,  Why is pt on eliquis  Please call [place] office [phone]  Thanks renee

## 2020-01-30 NOTE — Telephone Encounter (Signed)
Returned call to [place] with Dr. Juel Burrow office. No answer. Left msg to call back.

## 2020-02-04 ENCOUNTER — Ambulatory Visit (HOSPITAL_COMMUNITY): Payer: Medicare HMO | Admitting: Hematology

## 2020-02-06 DIAGNOSIS — Z79899 Other long term (current) drug therapy: Secondary | ICD-10-CM | POA: Diagnosis not present

## 2020-02-06 DIAGNOSIS — I129 Hypertensive chronic kidney disease with stage 1 through stage 4 chronic kidney disease, or unspecified chronic kidney disease: Secondary | ICD-10-CM | POA: Diagnosis not present

## 2020-02-06 DIAGNOSIS — I5022 Chronic systolic (congestive) heart failure: Secondary | ICD-10-CM | POA: Diagnosis not present

## 2020-02-06 DIAGNOSIS — R809 Proteinuria, unspecified: Secondary | ICD-10-CM | POA: Diagnosis not present

## 2020-02-06 DIAGNOSIS — E875 Hyperkalemia: Secondary | ICD-10-CM | POA: Diagnosis not present

## 2020-02-06 DIAGNOSIS — D472 Monoclonal gammopathy: Secondary | ICD-10-CM | POA: Diagnosis not present

## 2020-02-06 DIAGNOSIS — E211 Secondary hyperparathyroidism, not elsewhere classified: Secondary | ICD-10-CM | POA: Diagnosis not present

## 2020-02-07 DIAGNOSIS — I129 Hypertensive chronic kidney disease with stage 1 through stage 4 chronic kidney disease, or unspecified chronic kidney disease: Secondary | ICD-10-CM | POA: Diagnosis not present

## 2020-02-07 DIAGNOSIS — D472 Monoclonal gammopathy: Secondary | ICD-10-CM | POA: Diagnosis not present

## 2020-02-07 DIAGNOSIS — I5022 Chronic systolic (congestive) heart failure: Secondary | ICD-10-CM | POA: Diagnosis not present

## 2020-02-07 DIAGNOSIS — E875 Hyperkalemia: Secondary | ICD-10-CM | POA: Diagnosis not present

## 2020-02-07 DIAGNOSIS — E211 Secondary hyperparathyroidism, not elsewhere classified: Secondary | ICD-10-CM | POA: Diagnosis not present

## 2020-02-07 DIAGNOSIS — R809 Proteinuria, unspecified: Secondary | ICD-10-CM | POA: Diagnosis not present

## 2020-03-04 DIAGNOSIS — K4030 Unilateral inguinal hernia, with obstruction, without gangrene, not specified as recurrent: Secondary | ICD-10-CM | POA: Diagnosis not present

## 2020-03-04 DIAGNOSIS — L723 Sebaceous cyst: Secondary | ICD-10-CM | POA: Diagnosis not present

## 2020-03-04 DIAGNOSIS — E785 Hyperlipidemia, unspecified: Secondary | ICD-10-CM | POA: Diagnosis not present

## 2020-03-18 DIAGNOSIS — E7849 Other hyperlipidemia: Secondary | ICD-10-CM | POA: Diagnosis not present

## 2020-03-18 DIAGNOSIS — I10 Essential (primary) hypertension: Secondary | ICD-10-CM | POA: Diagnosis not present

## 2020-03-18 DIAGNOSIS — E875 Hyperkalemia: Secondary | ICD-10-CM | POA: Diagnosis not present

## 2020-03-18 DIAGNOSIS — I130 Hypertensive heart and chronic kidney disease with heart failure and stage 1 through stage 4 chronic kidney disease, or unspecified chronic kidney disease: Secondary | ICD-10-CM | POA: Diagnosis not present

## 2020-03-18 DIAGNOSIS — G894 Chronic pain syndrome: Secondary | ICD-10-CM | POA: Diagnosis not present

## 2020-03-18 DIAGNOSIS — E782 Mixed hyperlipidemia: Secondary | ICD-10-CM | POA: Diagnosis not present

## 2020-03-18 DIAGNOSIS — R69 Illness, unspecified: Secondary | ICD-10-CM | POA: Diagnosis not present

## 2020-03-18 DIAGNOSIS — D692 Other nonthrombocytopenic purpura: Secondary | ICD-10-CM | POA: Diagnosis not present

## 2020-03-18 DIAGNOSIS — R7301 Impaired fasting glucose: Secondary | ICD-10-CM | POA: Diagnosis not present

## 2020-03-18 DIAGNOSIS — E440 Moderate protein-calorie malnutrition: Secondary | ICD-10-CM | POA: Diagnosis not present

## 2020-03-23 DIAGNOSIS — I5032 Chronic diastolic (congestive) heart failure: Secondary | ICD-10-CM | POA: Diagnosis not present

## 2020-03-23 DIAGNOSIS — N183 Chronic kidney disease, stage 3 unspecified: Secondary | ICD-10-CM | POA: Diagnosis not present

## 2020-03-23 DIAGNOSIS — R7301 Impaired fasting glucose: Secondary | ICD-10-CM | POA: Diagnosis not present

## 2020-03-23 DIAGNOSIS — M79604 Pain in right leg: Secondary | ICD-10-CM | POA: Diagnosis not present

## 2020-03-23 DIAGNOSIS — J449 Chronic obstructive pulmonary disease, unspecified: Secondary | ICD-10-CM | POA: Diagnosis not present

## 2020-03-23 DIAGNOSIS — I482 Chronic atrial fibrillation, unspecified: Secondary | ICD-10-CM | POA: Diagnosis not present

## 2020-03-23 DIAGNOSIS — I959 Hypotension, unspecified: Secondary | ICD-10-CM | POA: Diagnosis not present

## 2020-03-23 DIAGNOSIS — N1832 Chronic kidney disease, stage 3b: Secondary | ICD-10-CM | POA: Diagnosis not present

## 2020-03-23 DIAGNOSIS — Z0001 Encounter for general adult medical examination with abnormal findings: Secondary | ICD-10-CM | POA: Diagnosis not present

## 2020-03-23 DIAGNOSIS — E875 Hyperkalemia: Secondary | ICD-10-CM | POA: Diagnosis not present

## 2020-03-23 DIAGNOSIS — I129 Hypertensive chronic kidney disease with stage 1 through stage 4 chronic kidney disease, or unspecified chronic kidney disease: Secondary | ICD-10-CM | POA: Diagnosis not present

## 2020-03-23 DIAGNOSIS — E782 Mixed hyperlipidemia: Secondary | ICD-10-CM | POA: Diagnosis not present

## 2020-03-25 ENCOUNTER — Ambulatory Visit (INDEPENDENT_AMBULATORY_CARE_PROVIDER_SITE_OTHER): Payer: Medicare HMO | Admitting: *Deleted

## 2020-03-25 DIAGNOSIS — I495 Sick sinus syndrome: Secondary | ICD-10-CM

## 2020-03-25 DIAGNOSIS — I4819 Other persistent atrial fibrillation: Secondary | ICD-10-CM

## 2020-03-25 LAB — CUP PACEART REMOTE DEVICE CHECK
Battery Remaining Longevity: 126 mo
Battery Remaining Percentage: 95.5 %
Battery Voltage: 3.01 V
Brady Statistic RV Percent Paced: 38 %
Date Time Interrogation Session: 20210526040013
Implantable Lead Implant Date: 20190128
Implantable Lead Implant Date: 20190128
Implantable Lead Location: 753859
Implantable Lead Location: 753860
Implantable Pulse Generator Implant Date: 20190128
Lead Channel Impedance Value: 490 Ohm
Lead Channel Pacing Threshold Amplitude: 0.75 V
Lead Channel Pacing Threshold Pulse Width: 0.5 ms
Lead Channel Sensing Intrinsic Amplitude: 11.2 mV
Lead Channel Setting Pacing Amplitude: 2.5 V
Lead Channel Setting Pacing Pulse Width: 0.5 ms
Lead Channel Setting Sensing Sensitivity: 2 mV
Pulse Gen Model: 2272
Pulse Gen Serial Number: 8982069

## 2020-03-25 NOTE — Progress Notes (Signed)
Remote pacemaker transmission.   

## 2020-03-26 ENCOUNTER — Encounter: Payer: Self-pay | Admitting: General Surgery

## 2020-03-26 ENCOUNTER — Ambulatory Visit: Payer: Medicare HMO | Admitting: General Surgery

## 2020-03-26 ENCOUNTER — Other Ambulatory Visit: Payer: Self-pay

## 2020-03-26 VITALS — BP 119/70 | HR 68 | Temp 96.9°F | Resp 14 | Ht 68.0 in | Wt 134.0 lb

## 2020-03-26 DIAGNOSIS — K4090 Unilateral inguinal hernia, without obstruction or gangrene, not specified as recurrent: Secondary | ICD-10-CM

## 2020-03-26 NOTE — Progress Notes (Signed)
Ricardo Garrett; [id number]; 1946/06/26   HPI 74 year old white male who was referred to my care by Dr. Nevada Crane for evaluation and treatment of a right inguinal hernia.  Patient states he noted the hernia several months ago.  He does have chronic right hip pain due to previous surgery.  He denies any radiation of the pain into the right inner thigh or scrotum.  No nausea or vomiting noted.  Not made worse with straining.  Has 0 out of 10 pain. Past Medical History:  Diagnosis Date  . Arthritis    "finger on right hand" (11/23/2017)  . Broken arm ~ 1952   "no OR; just reset it; not sure which side"  . CAD (coronary artery disease)   . CHF (congestive heart failure) ([place])    a. 12/2015: echo showing reduced EF of 35-40% b. NST: 09/2017: scar with peri-infarct ischemia, intermediate-risk study  . COPD (chronic obstructive pulmonary disease) ([place])    "was on RX when he had CHF; not on anything now" (11/23/2017)  . High cholesterol   . HOH (hard of hearing)   . Hypertension   . Persistent atrial fibrillation ([place])    a. s/p DCCV in 03/2016    Past Surgical History:  Procedure Laterality Date  . CARDIAC CATHETERIZATION  11/23/2017  . CARDIOVERSION N/A 04/05/2016   Procedure: CARDIOVERSION;  Surgeon: Josue Hector, MD;  [facility];  Service: Cardiovascular;  Laterality: N/A;  . COMPRESSION HIP SCREW Right 03/15/2013   Procedure: COMPRESSION HIP;  Surgeon: Sanjuana Kava, MD;  Location: [facility];  Service: Orthopedics;  Laterality: Right;  . CORONARY ATHERECTOMY N/A 11/24/2017   Procedure: CORONARY ATHERECTOMY;  Surgeon: Burnell Blanks, MD;  Location: [place] CV LAB;  Service: Cardiovascular;  Laterality: N/A;  . CORONARY BALLOON ANGIOPLASTY N/A 11/24/2017   Procedure: CORONARY BALLOON ANGIOPLASTY;  Surgeon: Burnell Blanks, MD;  Location: [place] CV LAB;  Service: Cardiovascular;  Laterality: N/A;  . CORONARY STENT INTERVENTION N/A 11/24/2017   Procedure: CORONARY  STENT INTERVENTION;  Surgeon: Burnell Blanks, MD;  Location: [place] CV LAB;  Service: Cardiovascular;  Laterality: N/A;  . FRACTURE SURGERY    . PACEMAKER IMPLANT N/A 11/27/2017   Procedure: PACEMAKER IMPLANT;  Surgeon: Evans Lance, MD;  Location: [place] CV LAB;  Service: Cardiovascular;  Laterality: N/A;  . RIGHT/LEFT HEART CATH AND CORONARY ANGIOGRAPHY N/A 11/23/2017   Procedure: RIGHT/LEFT HEART CATH AND CORONARY ANGIOGRAPHY;  Surgeon: Burnell Blanks, MD;  Location: [place] CV LAB;  Service: Cardiovascular;  Laterality: N/A;  . SKIN GRAFT Left    from right thigh  . TONSILLECTOMY      Family History  Problem Relation Age of Onset  . Diabetes Mother   . Heart attack Father     Current Outpatient Medications on File Prior to Visit  Medication Sig Dispense Refill  . apixaban (ELIQUIS) 2.5 MG TABS tablet Take 1 tablet (2.5 mg total) by mouth 2 (two) times daily. 60 tablet 5  . atorvastatin (LIPITOR) 80 MG tablet Take 1 tablet (80 mg total) by mouth daily. 90 tablet 3  . clopidogrel (PLAVIX) 75 MG tablet Take by mouth.    . digoxin (LANOXIN) 0.125 MG tablet Take 1 tablet (125 mcg total) by mouth daily. 90 tablet 3  . furosemide (LASIX) 40 MG tablet Take 1 tablet (40 mg total) by mouth daily. 30 tablet 6  . HYDROcodone-acetaminophen (NORCO/VICODIN) 5-325 MG tablet One tablet by mouth every six hours as  needed for pain. 56 tablet 0  . losartan (COZAAR) 50 MG tablet Take 1 tablet by mouth daily.    . metoprolol succinate (TOPROL-XL) 25 MG 24 hr tablet TAKE 1/2 (ONE-HALF) TABLET BY MOUTH IN THE MORNING AND 1 TABLET IN THE EVENING 135 tablet 3   No current facility-administered medications on file prior to visit.    No Known Allergies  Social History   Substance and Sexual Activity  Alcohol Use Yes   Comment: 11/23/2017 "beer once/month maybe"    Social History   Tobacco Use  Smoking Status Former Smoker  . Packs/day: 1.00  . Years: 20.00  .  Pack years: 20.00  . Types: Cigarettes  . Quit date: 11/01/1979  . Years since quitting: 40.4  Smokeless Tobacco Never Used    Review of Systems  Constitutional: Negative.   HENT: Positive for hearing loss.   Eyes: Negative.   Respiratory: Negative.   Cardiovascular: Negative.   Gastrointestinal: Negative.   Genitourinary: Negative.   Musculoskeletal: Positive for joint pain.  Skin: Negative.   Neurological: Negative.   Endo/Heme/Allergies: Bruises/bleeds easily.  Psychiatric/Behavioral: Negative.     Objective   Vitals:   03/26/20 1021  BP: 119/70  Pulse: 68  Resp: 14  Temp: (!) 96.9 F (36.1 C)  SpO2: 96%    Physical Exam Vitals reviewed.  Constitutional:      Appearance: Normal appearance. He is normal weight. He is not ill-appearing.  HENT:     Head: Normocephalic and atraumatic.  Cardiovascular:     Rate and Rhythm: Rhythm irregular.     Heart sounds: Murmur present. No friction rub. No gallop.      Comments: 2 out of 6 systolic ejection murmur.  Pacemaker in place left upper chest. Pulmonary:     Effort: Pulmonary effort is normal. No respiratory distress.     Breath sounds: Normal breath sounds. No stridor. No wheezing, rhonchi or rales.  Abdominal:     General: Abdomen is flat. Bowel sounds are normal. There is no distension.     Palpations: Abdomen is soft. There is no mass.     Tenderness: There is no abdominal tenderness. There is no guarding or rebound.     Hernia: A hernia is present.     Comments: Easily reducible right inguinal hernia.  No left inguinal hernia present.  Genitourinary:    Comments: Genitourinary examination is within normal limits. Skin:    General: Skin is warm and dry.  Neurological:     Mental Status: He is alert and oriented to person, place, and time.    Primary care notes reviewed Assessment  Right inguinal hernia, atrial fibrillation with chronic anticoagulation Plan   I told the patient that he does not have to have  his hernia fixed right now as he is asymptomatic.  He is at increased risk for complication due to his chronic anticoagulation.  He would like to try a truss.  This was prescribed for him.  Should he develop more symptoms, he was instructed to return to my care.  Follow-up as needed.  Literature was given.

## 2020-03-26 NOTE — Patient Instructions (Signed)

## 2020-03-30 ENCOUNTER — Other Ambulatory Visit: Payer: Self-pay | Admitting: Nurse Practitioner

## 2020-04-06 ENCOUNTER — Other Ambulatory Visit: Payer: Self-pay | Admitting: Cardiovascular Disease

## 2020-04-06 NOTE — Progress Notes (Deleted)
Date:  04/06/2020   ID:  Ricardo Garrett, DOB 04-04-1946, MRN [id number]  Provider Location: Office  PCP:  [name], MD  Cardiologist:  [name], MD   Electrophysiologist:  [name], MD   Evaluation Performed:  Follow-Up Visit  Chief Complaint:  Pacer/ Afib  History of Present Illness:    74 y.o. CHF EF 35-40%, PAF, HTN, HLD Had abnormal stress test and set up for cath 11/23/17 with orbital atherectomy and stenting of mid circumflex with DES and PCI small OM branch done by Dr Veronda Prude. Plan for DAT month then Eliquis and Plavix. 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 Wife indicates ETOH and Marijuana use Able to walk his dogs in am   No chest pain   Still seems to be having ETOH/Marijuana from time to time  Compliant with meds   PACART review normal function follows with Dr Lovena Le   The patient does not have symptoms concerning for COVID-19 infection (fever, chills, cough, or new shortness of breath).    Past Medical History:  Diagnosis Date  . Arthritis    "finger on right hand" (11/23/2017)  . Broken arm ~ 1952   "no OR; just reset it; not sure which side"  . CAD (coronary artery disease)   . CHF (congestive heart failure) ([place])    a. 12/2015: echo showing reduced EF of 35-40% b. NST: 09/2017: scar with peri-infarct ischemia, intermediate-risk study  . COPD (chronic obstructive pulmonary disease) ([place])    "was on RX when he had CHF; not on anything now" (11/23/2017)  . High cholesterol   . HOH (hard of hearing)   . Hypertension   . Persistent atrial fibrillation ([place])    a. s/p DCCV in 03/2016   Past Surgical History:  Procedure Laterality Date  . CARDIAC CATHETERIZATION  11/23/2017  . CARDIOVERSION N/A 04/05/2016   Procedure: CARDIOVERSION;  Surgeon: Josue Hector, MD;   [facility];  Service: Cardiovascular;  Laterality: N/A;  . COMPRESSION HIP SCREW Right 03/15/2013   Procedure: COMPRESSION HIP;  Surgeon: Sanjuana Kava, MD;  Location: [facility];  Service: Orthopedics;  Laterality: Right;  . CORONARY ATHERECTOMY N/A 11/24/2017   Procedure: CORONARY ATHERECTOMY;  Surgeon: Burnell Blanks, MD;  Location: [place] [facility];  Service: Cardiovascular;  Laterality: N/A;  . CORONARY BALLOON ANGIOPLASTY N/A 11/24/2017   Procedure: CORONARY BALLOON ANGIOPLASTY;  Surgeon: Burnell Blanks, MD;  Location: [place] [facility];  Service: Cardiovascular;  Laterality: N/A;  . CORONARY STENT INTERVENTION N/A 11/24/2017   Procedure: CORONARY STENT INTERVENTION;  Surgeon: Burnell Blanks, MD;  Location: [place] [facility];  Service: Cardiovascular;  Laterality: N/A;  . FRACTURE SURGERY    . PACEMAKER IMPLANT N/A 11/27/2017   Procedure: PACEMAKER IMPLANT;  Surgeon: Evans Lance, MD;  Location: [place] [facility];  Service: Cardiovascular;  Laterality: N/A;  . RIGHT/LEFT HEART CATH AND CORONARY ANGIOGRAPHY N/A 11/23/2017   Procedure: RIGHT/LEFT HEART CATH AND CORONARY ANGIOGRAPHY;  Surgeon: Burnell Blanks, MD;  Location: [place] [facility];  Service: Cardiovascular;  Laterality: N/A;  . SKIN GRAFT Left    from right thigh  . TONSILLECTOMY       No outpatient medications have been marked as taking for the 04/10/20 encounter (Appointment) with Josue Hector, MD.  Allergies:   Patient has no known allergies.   Social History   Tobacco Use  . Smoking status: Former Smoker    Packs/day: 1.00    Years: 20.00    Pack years: 20.00    Types: Cigarettes    Quit date: 11/01/1979    Years since quitting: 40.4  . Smokeless tobacco: Never Used  Substance Use Topics  . Alcohol use: Yes    Comment: 11/23/2017 "beer once/month maybe"  . Drug use: Yes    Types: Marijuana    Comment: 11/23/2017 "nothing for years"     Family Hx: The  patient's family history includes Diabetes in his mother; Heart attack in his father.  ROS:   Please see the history of present illness.     All other systems reviewed and are negative.   Prior CV studies:   The following studies were reviewed today:  PACEART 12/25/18 Echo 11/25/17 EF 50-55%  Labs/Other Tests and Data Reviewed:    EKG:   07/20/18  afib rate 68 intermittent V pacing   Recent Labs: 01/14/2020: BUN 41; Creatinine, Ser 1.88; Potassium 4.5; Sodium 136   Recent Lipid Panel Lab Results  Component Value Date/Time   CHOL 193 02/12/2016 12:00 AM   TRIG 196 02/12/2016 12:00 AM   HDL 29 02/12/2016 12:00 AM   CHOLHDL 6.7 02/12/2016 12:00 AM   LDLCALC 125 02/12/2016 12:00 AM    Wt Readings from Last 3 Encounters:  03/26/20 134 lb (60.8 kg)  01/14/20 138 lb 1 oz (62.6 kg)  10/08/19 141 lb (64 kg)     Objective:    Vital Signs:  There were no vitals taken for this visit.   Affect appropriate Chronically ill elderly white male  HEENT: normal Neck supple with no adenopathy JVP normal no bruits no thyromegaly Lungs clear with no wheezing and good diaphragmatic motion Heart:  S1/S2 no murmur, no rub, gallop or click PMI normal pacer under left clavicle  Abdomen: benighn, BS positve, no tenderness, no AAA no bruit.  No HSM or HJR Distal pulses intact with no bruits No edema Neuro non-focal Skin warm and dry No muscular weakness   ASSESSMENT & PLAN:    1. Chronic Combined Systolic and Diastolic CHF: most recent EF 11/25/17 TTE  50-55% echo 11/25/17 improved continue medical Rx will update echo   2. Persistent Atrial Fibrillation  Failed [facility] 03/2016 continue eliquis .   3. HTN:  Well controlled.  Continue current medications and low sodium Dash type diet.    4. HLD: on statin labs with primary    6. Pacer:  Normal function by PaceArt f/u Dr Lovena Le   7. Ortho:  F/u Dr Luna Glasgow for x-rays and consider referral back to PT/OT  8. CAD: post stenting mid  circumflex 11/23/17 On plavix and eliquis no ASA No angina stable    COVID-19 Education: The signs and symptoms of COVID-19 were discussed with the patient and how to seek care for testing (follow up with PCP or arrange E-visit).  The importance of social distancing was discussed today.    Medication Adjustments/Labs and Tests Ordered: Current medicines are reviewed at length with the patient today.  Concerns regarding medicines are outlined above.   Tests Ordered:  Echo for ischemic DCM   Medication Changes: No orders of the defined types were placed in this encounter.   Disposition:  Follow up in 6 months   Signed, [name], MD  04/06/2020 5:05 PM    [facility] 

## 2020-04-07 ENCOUNTER — Encounter (HOSPITAL_COMMUNITY): Payer: Self-pay | Admitting: Hematology

## 2020-04-07 ENCOUNTER — Inpatient Hospital Stay (HOSPITAL_COMMUNITY): Payer: Medicare HMO | Attending: Hematology | Admitting: Hematology

## 2020-04-07 VITALS — BP 146/111 | HR 91 | Temp 96.6°F | Resp 19 | Wt 136.5 lb

## 2020-04-07 DIAGNOSIS — D472 Monoclonal gammopathy: Secondary | ICD-10-CM | POA: Insufficient documentation

## 2020-04-07 DIAGNOSIS — R911 Solitary pulmonary nodule: Secondary | ICD-10-CM | POA: Diagnosis not present

## 2020-04-07 DIAGNOSIS — Z79899 Other long term (current) drug therapy: Secondary | ICD-10-CM | POA: Insufficient documentation

## 2020-04-07 DIAGNOSIS — I2510 Atherosclerotic heart disease of native coronary artery without angina pectoris: Secondary | ICD-10-CM | POA: Diagnosis not present

## 2020-04-07 DIAGNOSIS — I10 Essential (primary) hypertension: Secondary | ICD-10-CM | POA: Insufficient documentation

## 2020-04-07 NOTE — Progress Notes (Signed)
[name], Ricardo Garrett [id number]   CLINIC:  Medical Oncology/Hematology  PCP:  [name], MD [address] [place] [address]  [phone]  REASON FOR VISIT:  Follow-up for monoclonal gammopathy  PRIOR THERAPY: None  CURRENT THERAPY: Under work-up  INTERVAL HISTORY:  [name], a 74 y.o. male, returns for routine follow-up for his monoclonal gammopathy. Decklin was last seen on 01/14/2020.  He denies any fevers, night sweats or weight loss.  Reports some pain in the right ankle.  He reports that he has been taking care of his wife who has dementia.   REVIEW OF SYSTEMS:  Review of Systems  Constitutional: Positive for unexpected weight change. Negative for appetite change and fatigue.  Musculoskeletal: Positive for arthralgias (R ankle 3/10).  Psychiatric/Behavioral: Positive for depression.  All other systems reviewed and are negative.   PAST MEDICAL/SURGICAL HISTORY:  Past Medical History:  Diagnosis Date  . Arthritis    "finger on right hand" (11/23/2017)  . Broken arm ~ 1952   "no OR; just reset it; not sure which side"  . CAD (coronary artery disease)   . CHF (congestive heart failure) ([place])    a. 12/2015: echo showing reduced EF of 35-40% b. NST: 09/2017: scar with peri-infarct ischemia, intermediate-risk study  . COPD (chronic obstructive pulmonary disease) ([place])    "was on RX when he had CHF; not on anything now" (11/23/2017)  . High cholesterol   . HOH (hard of hearing)   . Hypertension   . Persistent atrial fibrillation ([place])    a. s/p DCCV in 03/2016   Past Surgical History:  Procedure Laterality Date  . CARDIAC CATHETERIZATION  11/23/2017  . CARDIOVERSION N/A 04/05/2016   Procedure: CARDIOVERSION;  Surgeon: Josue Hector, MD;  [facility];  Service: Cardiovascular;  Laterality: N/A;  . COMPRESSION HIP SCREW Right 03/15/2013   Procedure: COMPRESSION HIP;  Surgeon: Sanjuana Kava, MD;   Location: [facility];  Service: Orthopedics;  Laterality: Right;  . CORONARY ATHERECTOMY N/A 11/24/2017   Procedure: CORONARY ATHERECTOMY;  Surgeon: Burnell Blanks, MD;  Location: [place] [facility] LAB;  Service: Cardiovascular;  Laterality: N/A;  . CORONARY BALLOON ANGIOPLASTY N/A 11/24/2017   Procedure: CORONARY BALLOON ANGIOPLASTY;  Surgeon: Burnell Blanks, MD;  Location: [place] CV LAB;  Service: Cardiovascular;  Laterality: N/A;  . CORONARY STENT INTERVENTION N/A 11/24/2017   Procedure: CORONARY STENT INTERVENTION;  Surgeon: Burnell Blanks, MD;  Location: [place] CV LAB;  Service: Cardiovascular;  Laterality: N/A;  . FRACTURE SURGERY    . PACEMAKER IMPLANT N/A 11/27/2017   Procedure: PACEMAKER IMPLANT;  Surgeon: Evans Lance, MD;  Location: [place] CV LAB;  Service: Cardiovascular;  Laterality: N/A;  . RIGHT/LEFT HEART CATH AND CORONARY ANGIOGRAPHY N/A 11/23/2017   Procedure: RIGHT/LEFT HEART CATH AND CORONARY ANGIOGRAPHY;  Surgeon: Burnell Blanks, MD;  Location: [place] CV LAB;  Service: Cardiovascular;  Laterality: N/A;  . SKIN GRAFT Left    from right thigh  . TONSILLECTOMY      SOCIAL HISTORY:  Social History   Socioeconomic History  . Marital status: Married    Spouse name: Not on file  . Number of children: Not on file  . Years of education: Not on file  . Highest education level: Not on file  Occupational History  . Not on file  Tobacco Use  . Smoking status: Former Smoker  Packs/day: 1.00    Years: 20.00    Pack years: 20.00    Types: Cigarettes    Quit date: 11/01/1979    Years since quitting: 40.4  . Smokeless tobacco: Never Used  Substance and Sexual Activity  . Alcohol use: Yes    Comment: 11/23/2017 "beer once/month maybe"  . Drug use: Yes    Types: Marijuana    Comment: 11/23/2017 "nothing for years"  . Sexual activity: Yes    Birth control/protection: None  Other Topics Concern  . Not on file  Social History  Narrative  . Not on file   Social Determinants of Health   Financial Resource Strain: Low Risk   . Difficulty of Paying Living Expenses: Not very hard  Food Insecurity: No Food Insecurity  . Worried About Charity fundraiser in the Last Year: Never true  . Ran Out of Food in the Last Year: Never true  Transportation Needs: No Transportation Needs  . Lack of Transportation (Medical): No  . Lack of Transportation (Non-Medical): No  Physical Activity: Inactive  . Days of Exercise per Week: 0 days  . Minutes of Exercise per Session: 0 min  Stress: No Stress Concern Present  . Feeling of Stress : Not at all  Social Connections: Somewhat Isolated  . Frequency of Communication with Friends and Family: Twice a week  . Frequency of Social Gatherings with Friends and Family: Twice a week  . Attends Religious Services: Never  . Active Member of Clubs or Organizations: No  . Attends Archivist Meetings: Never  . Marital Status: Married  Human resources officer Violence: Unknown  . Fear of Current or Ex-Partner: Patient refused  . Emotionally Abused: Patient refused  . Physically Abused: Patient refused  . Sexually Abused: Patient refused    FAMILY HISTORY:  Family History  Problem Relation Age of Onset  . Diabetes Mother   . Heart attack Father     CURRENT MEDICATIONS:  Current Outpatient Medications  Medication Sig Dispense Refill  . apixaban (ELIQUIS) 2.5 MG TABS tablet Take 1 tablet (2.5 mg total) by mouth 2 (two) times daily. 60 tablet 5  . atorvastatin (LIPITOR) 80 MG tablet Take 1 tablet (80 mg total) by mouth daily. 90 tablet 3  . clopidogrel (PLAVIX) 75 MG tablet Take 1 tablet by mouth once daily 90 tablet 3  . digoxin (LANOXIN) 0.125 MG tablet Take 1 tablet (125 mcg total) by mouth daily. 90 tablet 3  . furosemide (LASIX) 40 MG tablet Take 1 tablet (40 mg total) by mouth daily. 30 tablet 6  . HYDROcodone-acetaminophen (NORCO/VICODIN) 5-325 MG tablet One tablet by mouth  every six hours as needed for pain. 56 tablet 0  . losartan (COZAAR) 50 MG tablet Take 1 tablet by mouth daily.    . metoprolol succinate (TOPROL-XL) 25 MG 24 hr tablet TAKE 1/2 (ONE-HALF) TABLET BY MOUTH IN THE MORNING AND 1 TABLET IN THE EVENING 135 tablet 3   No current facility-administered medications for this visit.    ALLERGIES:  No Known Allergies  PHYSICAL EXAM:  Performance status (ECOG): 1 - Symptomatic but completely ambulatory  There were no vitals filed for this visit. Wt Readings from Last 3 Encounters:  03/26/20 134 lb (60.8 kg)  01/14/20 138 lb 1 oz (62.6 kg)  10/08/19 141 lb (64 kg)   Physical Exam Vitals reviewed.  Constitutional:      Appearance: Normal appearance.  Cardiovascular:     Rate and Rhythm: Normal rate and  regular rhythm.     Pulses: Normal pulses.     Heart sounds: Normal heart sounds.  Pulmonary:     Effort: Pulmonary effort is normal.     Breath sounds: Normal breath sounds.  Abdominal:     Palpations: Abdomen is soft.     Tenderness: There is no abdominal tenderness.     Hernia: A hernia (R inguinal) is present.  Neurological:     General: No focal deficit present.     Mental Status: He is alert and oriented to person, place, and time.  Psychiatric:        Mood and Affect: Mood normal.        Behavior: Behavior normal.     LABORATORY DATA:  I have reviewed the labs as listed.  CBC Latest Ref Rng & Units 11/26/2017 11/25/2017 11/24/2017  WBC 4.0 - 10.5 K/uL 8.2 8.9 7.3  Hemoglobin 13.0 - 17.0 g/dL 15.1 15.5 15.2  Hematocrit 39.0 - 52.0 % 43.9 45.9 44.2  Platelets 150 - 400 K/uL 199 194 180   CMP Latest Ref Rng & Units 01/14/2020 11/26/2017 11/25/2017  Glucose 70 - 99 mg/dL 115(H) 107(H) 116(H)  BUN 8 - 23 mg/dL 41(H) 15 17  Creatinine 0.61 - 1.24 mg/dL 1.88(H) 1.36(H) 1.24  Sodium 135 - 145 mmol/L 136 137 137  Potassium 3.5 - 5.1 mmol/L 4.5 4.4 4.7  Chloride 98 - 111 mmol/L 102 108 107  CO2 22 - 32 mmol/L 24 22 22   Calcium 8.9 -  10.3 mg/dL 8.9 8.8(L) 8.9  Total Protein g/dL - - -  Total Bilirubin mg/dL - - -  Alkaline Phos U/L - - -  AST U/L - - -  ALT - - - -      Component Value Date/Time   RBC 4.65 11/26/2017 0424   MCV 94.4 11/26/2017 0424   MCV 89.2 02/12/2016 1100   MCH 32.5 11/26/2017 0424   MCHC 34.4 11/26/2017 0424   RDW 13.1 11/26/2017 0424   RDW 15.1 02/12/2016 1100   LYMPHSABS 1.9 11/14/2017 1539   MONOABS 1.3 (H) 11/14/2017 1539   EOSABS 0.3 11/14/2017 1539   BASOSABS 0.0 11/14/2017 1539   Lab Results  Component Value Date   KPAFRELGTCHN 44.8 (H) 01/14/2020   LAMBDASER 16.0 01/14/2020   KAPLAMBRATIO 2.80 (H) 01/14/2020   KAPLAMBRATIO 3.04 01/21/2016    DIAGNOSTIC IMAGING:  I have independently reviewed the scans and discussed with the patient. CUP PACEART REMOTE DEVICE CHECK  Result Date: 03/25/2020 Scheduled remote reviewed. Normal device function.  Permanent AF controlled VR. Next remote 91 days. [employer]    ASSESSMENT:  1.  IgG kappa Monoclonal gammopathy: -Evaluated at the request of Dr. Theador Hawthorne for monoclonal gammopathy.  Labs on 11/28/2019 showed M spike 0.48 g.  Free light chain ratio was 3.02.  24-hour urine showed protein 2.4 g.  M spike was positive.  50 mg of monoclonal protein was seen. -Blood work on 01/14/2020 shows 0.5 g of IgG kappa M spike. -Kappa light chains are 44.8, lambda light chain 16, ratio of 2.80. -Creatinine is 1.88 and stable.  Calcium is 8.9. -Skeletal survey on 01/14/2020 was negative for lytic lesions.  2.  CKD: -Ultrasound of the kidneys on 11/29/2019 showed normal kidneys, normal-appearing bladder and partially visualized prostate prominent in size.   PLAN:  1.    IgG kappa MGUS: -I have reviewed normal pathophysiology of monoclonal gammopathy of undetermined significance.  This requires continued monitoring. -We will plan to repeat his labs in  4 to 6 months.  2.  Tobacco abuse: -He quit smoking about 30 years ago.  Smoked 1 pack/day for 20 to 30  years.  He worked as a Nature conservation officer. -Marine scientist showed bibasilar atelectasis greater on right with question developing nodular density at left lung base. -Based on this I have recommended CT scan of the chest.  Because of his renal function, we will do without contrast.   Orders placed this encounter:  No orders of the defined types were placed in this encounter.    Derek Jack, MD [place] [phone]   I, Milinda Antis, am acting as a scribe for Dr. Sanda Linger.  I, Derek Jack MD, have reviewed the above documentation for accuracy and completeness, and I agree with the above.

## 2020-04-07 NOTE — Patient Instructions (Signed)
[place] at [facility] Discharge Instructions  You were seen today by Dr. Delton Coombes. He went over your recent results and possible diagnoses. You will be scheduled for a CT scan of your chest. Dr. Delton Coombes will see you back after your CT scan for labs and follow up.   Thank you for choosing [place] at [facility] to provide your oncology and hematology care.  To afford each patient quality time with our provider, please arrive at least 15 minutes before your scheduled appointment time.   If you have a lab appointment with the [facility] please come in thru the Main Entrance and check in at the main information desk  You need to re-schedule your appointment should you arrive 10 or more minutes late.  We strive to give you quality time with our providers, and arriving late affects you and other patients whose appointments are after yours.  Also, if you no show three or more times for appointments you may be dismissed from the clinic at the providers discretion.     Again, thank you for choosing [facility].  Our hope is that these requests will decrease the amount of time that you wait before being seen by our physicians.       _____________________________________________________________  Should you have questions after your visit to [facility], please contact our office at [phone] between the hours of 8:00 a.m. and 4:30 p.m.  Voicemails left after 4:00 p.m. will not be returned until the following business day.  For prescription refill requests, have your pharmacy contact our office and allow 72 hours.    Cancer Center Support Programs:   > Cancer Support Group  2nd Tuesday of the month 1pm-2pm, Journey Room

## 2020-04-10 ENCOUNTER — Ambulatory Visit: Payer: Medicare HMO | Admitting: Cardiovascular Disease

## 2020-04-10 DIAGNOSIS — I129 Hypertensive chronic kidney disease with stage 1 through stage 4 chronic kidney disease, or unspecified chronic kidney disease: Secondary | ICD-10-CM | POA: Diagnosis not present

## 2020-04-10 DIAGNOSIS — I5022 Chronic systolic (congestive) heart failure: Secondary | ICD-10-CM | POA: Diagnosis not present

## 2020-04-10 DIAGNOSIS — E211 Secondary hyperparathyroidism, not elsewhere classified: Secondary | ICD-10-CM | POA: Diagnosis not present

## 2020-04-10 DIAGNOSIS — E875 Hyperkalemia: Secondary | ICD-10-CM | POA: Diagnosis not present

## 2020-04-10 DIAGNOSIS — R809 Proteinuria, unspecified: Secondary | ICD-10-CM | POA: Diagnosis not present

## 2020-04-10 DIAGNOSIS — D472 Monoclonal gammopathy: Secondary | ICD-10-CM | POA: Diagnosis not present

## 2020-04-14 NOTE — Progress Notes (Signed)
Date:  04/14/2020   ID:  Ricardo Garrett, DOB Oct 22, 1946, MRN [id number]  Provider Location: Office  PCP:  [name], MD  Cardiologist:  [name], MD   Electrophysiologist:  [name], MD   Evaluation Performed:  Follow-Up Visit  Chief Complaint:  Pacer/ Afib  History of Present Illness:    74 y.o. CHF EF 35-40%, PAF, HTN, HLD Had abnormal stress test and set up for cath 11/23/17 with orbital atherectomy and stenting of mid circumflex with DES and PCI small OM branch done by Dr Veronda Prude. Plan for DAT month then Eliquis and Plavix. 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 Wife indicates ETOH and Marijuana use Able to walk his dogs in am   No chest pain   Still seems to be having ETOH/Marijuana from time to time  Compliant with meds   PACART review normal function follows with Dr Lovena Le   Has had Covid vaccine   The patient does not have symptoms concerning for COVID-19 infection (fever, chills, cough, or new shortness of breath).    Past Medical History:  Diagnosis Date  . Arthritis    "finger on right hand" (11/23/2017)  . Broken arm ~ 1952   "no OR; just reset it; not sure which side"  . CAD (coronary artery disease)   . CHF (congestive heart failure) ([place])    a. 12/2015: echo showing reduced EF of 35-40% b. NST: 09/2017: scar with peri-infarct ischemia, intermediate-risk study  . COPD (chronic obstructive pulmonary disease) ([place])    "was on RX when he had CHF; not on anything now" (11/23/2017)  . High cholesterol   . HOH (hard of hearing)   . Hypertension   . Persistent atrial fibrillation ([place])    a. s/p DCCV in 03/2016   Past Surgical History:  Procedure Laterality Date  . CARDIAC CATHETERIZATION  11/23/2017  . CARDIOVERSION N/A 04/05/2016   Procedure: CARDIOVERSION;   Surgeon: Josue Hector, MD;  [facility];  Service: Cardiovascular;  Laterality: N/A;  . COMPRESSION HIP SCREW Right 03/15/2013   Procedure: COMPRESSION HIP;  Surgeon: Sanjuana Kava, MD;  Location: [facility];  Service: Orthopedics;  Laterality: Right;  . CORONARY ATHERECTOMY N/A 11/24/2017   Procedure: CORONARY ATHERECTOMY;  Surgeon: Burnell Blanks, MD;  Location: [place] [facility] LAB;  Service: Cardiovascular;  Laterality: N/A;  . CORONARY BALLOON ANGIOPLASTY N/A 11/24/2017   Procedure: CORONARY BALLOON ANGIOPLASTY;  Surgeon: Burnell Blanks, MD;  Location: [place] [facility];  Service: Cardiovascular;  Laterality: N/A;  . CORONARY STENT INTERVENTION N/A 11/24/2017   Procedure: CORONARY STENT INTERVENTION;  Surgeon: Burnell Blanks, MD;  Location: [place] [facility] LAB;  Service: Cardiovascular;  Laterality: N/A;  . FRACTURE SURGERY    . PACEMAKER IMPLANT N/A 11/27/2017   Procedure: PACEMAKER IMPLANT;  Surgeon: Evans Lance, MD;  Location: [place] [facility] LAB;  Service: Cardiovascular;  Laterality: N/A;  . RIGHT/LEFT HEART CATH AND CORONARY ANGIOGRAPHY N/A 11/23/2017   Procedure: RIGHT/LEFT HEART CATH AND CORONARY ANGIOGRAPHY;  Surgeon: Burnell Blanks, MD;  Location: [place] CV LAB;  Service: Cardiovascular;  Laterality: N/A;  . SKIN GRAFT Left    from right thigh  . TONSILLECTOMY       No outpatient medications have been marked as taking for the 04/15/20 encounter (  Appointment) with Josue Hector, MD.     Allergies:   Patient has no known allergies.   Social History   Tobacco Use  . Smoking status: Former Smoker    Packs/day: 1.00    Years: 20.00    Pack years: 20.00    Types: Cigarettes    Quit date: 11/01/1979    Years since quitting: 40.4  . Smokeless tobacco: Never Used  Vaping Use  . Vaping Use: Never used  Substance Use Topics  . Alcohol use: Yes    Comment: 11/23/2017 "beer once/month maybe"  . Drug use: Yes    Types: Marijuana     Comment: 11/23/2017 "nothing for years"     Family Hx: The patient's family history includes Diabetes in his mother; Heart attack in his father.  ROS:   Please see the history of present illness.     All other systems reviewed and are negative.   Prior CV studies:   The following studies were reviewed today:  PACEART 12/25/18 Echo 11/25/17 EF 50-55%  Labs/Other Tests and Data Reviewed:    EKG:   07/20/18  afib rate 68 intermittent V pacing   Recent Labs: 01/14/2020: BUN 41; Creatinine, Ser 1.88; Potassium 4.5; Sodium 136   Recent Lipid Panel Lab Results  Component Value Date/Time   CHOL 193 02/12/2016 12:00 AM   TRIG 196 02/12/2016 12:00 AM   HDL 29 02/12/2016 12:00 AM   CHOLHDL 6.7 02/12/2016 12:00 AM   LDLCALC 125 02/12/2016 12:00 AM    Wt Readings from Last 3 Encounters:  04/07/20 136 lb 8 oz (61.9 kg)  03/26/20 134 lb (60.8 kg)  01/14/20 138 lb 1 oz (62.6 kg)     Objective:    Vital Signs:  There were no vitals taken for this visit.   Affect appropriate Chronically ill elderly white male  HEENT: very hard of hearing  Neck supple with no adenopathy JVP normal no bruits no thyromegaly Lungs rhonchi / exp wheezing  Heart:  S1/S2 no murmur, no rub, gallop or click PMI normal pacer under left clavicle  Abdomen: benighn, BS positve, no tenderness, no AAA no bruit.  No HSM or HJR Distal pulses intact with no bruits No edema Neuro non-focal Skin warm and dry No muscular weakness   ASSESSMENT & PLAN:    1. Chronic Combined Systolic and Diastolic CHF: most recent EF 11/25/17 TTE  50-55% echo  improved continue medical Rx will update echo   2. Persistent Atrial Fibrillation  Failed [facility] 03/2016 continue eliquis .   3. HTN:  Well controlled.  Continue current medications and low sodium Dash type diet.    4. HLD: on statin labs with primary    6. Pacer:  Normal function by PaceArt f/u Dr Lovena Le   7. Ortho:  F/u Dr Luna Glasgow for x-rays and consider  referral back to PT/OT  8. CAD: post stenting mid circumflex 11/23/17 On plavix and eliquis no ASA No angina stable   9. COPD:  History of smoking and ongoing marijuana use lung cancer screening CT ordered due  To abnormal lung exam as well    COVID-19 Education: The signs and symptoms of COVID-19 were discussed with the patient and how to seek care for testing (follow up with PCP or arrange E-visit).  The importance of social distancing was discussed today.    Medication Adjustments/Labs and Tests Ordered: Current medicines are reviewed at length with the patient today.  Concerns regarding medicines are outlined above.  Tests Ordered:  Lung cancer screening CT   Medication Changes: No orders of the defined types were placed in this encounter.   Disposition:  Follow up in 6 months   Signed, [name], MD  04/14/2020 3:42 PM    [facility]

## 2020-04-15 ENCOUNTER — Ambulatory Visit: Payer: Medicare HMO | Admitting: Cardiovascular Disease

## 2020-04-15 ENCOUNTER — Other Ambulatory Visit: Payer: Self-pay

## 2020-04-15 ENCOUNTER — Encounter: Payer: Self-pay | Admitting: Cardiovascular Disease

## 2020-04-15 VITALS — BP 100/60 | HR 60 | Ht 68.0 in | Wt 130.4 lb

## 2020-04-15 DIAGNOSIS — I5022 Chronic systolic (congestive) heart failure: Secondary | ICD-10-CM

## 2020-04-15 DIAGNOSIS — I5042 Chronic combined systolic (congestive) and diastolic (congestive) heart failure: Secondary | ICD-10-CM

## 2020-04-15 DIAGNOSIS — R69 Illness, unspecified: Secondary | ICD-10-CM | POA: Diagnosis not present

## 2020-04-15 DIAGNOSIS — I4819 Other persistent atrial fibrillation: Secondary | ICD-10-CM

## 2020-04-15 DIAGNOSIS — R062 Wheezing: Secondary | ICD-10-CM

## 2020-04-15 DIAGNOSIS — I495 Sick sinus syndrome: Secondary | ICD-10-CM | POA: Diagnosis not present

## 2020-04-15 DIAGNOSIS — I10 Essential (primary) hypertension: Secondary | ICD-10-CM

## 2020-04-15 DIAGNOSIS — Z950 Presence of cardiac pacemaker: Secondary | ICD-10-CM

## 2020-04-15 DIAGNOSIS — F17200 Nicotine dependence, unspecified, uncomplicated: Secondary | ICD-10-CM

## 2020-04-15 NOTE — Patient Instructions (Addendum)
Medication Instructions:  Your physician recommends that you continue on your current medications as directed. Please refer to the Current Medication list given to you today.  *If you need a refill on your cardiac medications before your next appointment, please call your pharmacy*   Lab Work: None ordeed  If you have labs (blood work) drawn today and your tests are completely normal, you will receive your results only by: Marland Kitchen MyChart Message (if you have MyChart) OR . A paper copy in the mail If you have any lab test that is abnormal or we need to change your treatment, we will call you to review the results.   Testing/Procedures: Your physician recommends that you have a Lung Cancer Screening CT Scan.     Follow-Up: At [facility], you and your health needs are our priority.  As part of our continuing mission to provide you with exceptional heart care, we have created designated Provider Care Teams.  These Care Teams include your primary Cardiologist (physician) and Advanced Practice Providers (APPs -  Physician Assistants and Nurse Practitioners) who all work together to provide you with the care you need, when you need it.  We recommend signing up for the patient portal called "MyChart".  Sign up information is provided on this After Visit Summary.  MyChart is used to connect with patients for Virtual Visits (Telemedicine).  Patients are able to view lab/test results, encounter notes, upcoming appointments, etc.  Non-urgent messages can be sent to your provider as well.   To learn more about what you can do with MyChart, go to [email or web address].    Your next appointment:   6 month(s)  The format for your next appointment:   In Person  Provider:   [name], MD   Other Instructions

## 2020-04-21 ENCOUNTER — Ambulatory Visit (HOSPITAL_COMMUNITY): Payer: Medicare HMO

## 2020-04-22 NOTE — Telephone Encounter (Signed)
This encounter was created in error - please disregard.

## 2020-05-05 ENCOUNTER — Ambulatory Visit (HOSPITAL_COMMUNITY)
Admission: RE | Admit: 2020-05-05 | Discharge: 2020-05-05 | Disposition: A | Discharge status: Home / Self Care | Payer: Medicare HMO | Source: Ambulatory Visit | Attending: Hematology | Admitting: Hematology

## 2020-05-05 ENCOUNTER — Inpatient Hospital Stay (HOSPITAL_COMMUNITY): Payer: Medicare HMO | Attending: Hematology

## 2020-05-05 ENCOUNTER — Ambulatory Visit (HOSPITAL_COMMUNITY): Payer: Medicare HMO

## 2020-05-05 ENCOUNTER — Other Ambulatory Visit: Payer: Self-pay

## 2020-05-05 DIAGNOSIS — Z87891 Personal history of nicotine dependence: Secondary | ICD-10-CM | POA: Diagnosis not present

## 2020-05-05 DIAGNOSIS — R911 Solitary pulmonary nodule: Secondary | ICD-10-CM

## 2020-05-05 DIAGNOSIS — N189 Chronic kidney disease, unspecified: Secondary | ICD-10-CM | POA: Insufficient documentation

## 2020-05-05 DIAGNOSIS — J40 Bronchitis, not specified as acute or chronic: Secondary | ICD-10-CM | POA: Diagnosis not present

## 2020-05-05 DIAGNOSIS — J432 Centrilobular emphysema: Secondary | ICD-10-CM | POA: Diagnosis not present

## 2020-05-05 DIAGNOSIS — J9811 Atelectasis: Secondary | ICD-10-CM | POA: Diagnosis not present

## 2020-05-05 DIAGNOSIS — E875 Hyperkalemia: Secondary | ICD-10-CM | POA: Insufficient documentation

## 2020-05-05 DIAGNOSIS — D472 Monoclonal gammopathy: Secondary | ICD-10-CM | POA: Diagnosis not present

## 2020-05-05 DIAGNOSIS — I700 Atherosclerosis of aorta: Secondary | ICD-10-CM | POA: Diagnosis not present

## 2020-05-05 LAB — CBC WITH DIFFERENTIAL/PLATELET
Abs Immature Granulocytes: 0.07 10*3/uL (ref 0.00–0.07)
Basophils Absolute: 0.1 10*3/uL (ref 0.0–0.1)
Basophils Relative: 1 %
Eosinophils Absolute: 0.1 10*3/uL (ref 0.0–0.5)
Eosinophils Relative: 2 %
HCT: 43.8 % (ref 39.0–52.0)
Hemoglobin: 14 g/dL (ref 13.0–17.0)
Immature Granulocytes: 1 %
Lymphocytes Relative: 18 %
Lymphs Abs: 1.4 10*3/uL (ref 0.7–4.0)
MCH: 31.7 pg (ref 26.0–34.0)
MCHC: 32 g/dL (ref 30.0–36.0)
MCV: 99.3 fL (ref 80.0–100.0)
Monocytes Absolute: 0.9 10*3/uL (ref 0.1–1.0)
Monocytes Relative: 11 %
Neutro Abs: 5.3 10*3/uL (ref 1.7–7.7)
Neutrophils Relative %: 67 %
Platelets: 314 10*3/uL (ref 150–400)
RBC: 4.41 MIL/uL (ref 4.22–5.81)
RDW: 13.8 % (ref 11.5–15.5)
WBC: 7.9 10*3/uL (ref 4.0–10.5)
nRBC: 0 % (ref 0.0–0.2)

## 2020-05-05 LAB — COMPREHENSIVE METABOLIC PANEL
ALT: 19 U/L (ref 0–44)
AST: 22 U/L (ref 15–41)
Albumin: 3.9 g/dL (ref 3.5–5.0)
Alkaline Phosphatase: 80 U/L (ref 38–126)
Anion gap: 9 (ref 5–15)
BUN: 48 mg/dL — ABNORMAL HIGH (ref 8–23)
CO2: 20 mmol/L — ABNORMAL LOW (ref 22–32)
Calcium: 8.9 mg/dL (ref 8.9–10.3)
Chloride: 106 mmol/L (ref 98–111)
Creatinine, Ser: 2.41 mg/dL — ABNORMAL HIGH (ref 0.61–1.24)
GFR calc Af Amer: 29 mL/min — ABNORMAL LOW (ref >60)
GFR calc non Af Amer: 25 mL/min — ABNORMAL LOW (ref >60)
Glucose, Bld: 118 mg/dL — ABNORMAL HIGH (ref 70–99)
Potassium: 6 mmol/L — ABNORMAL HIGH (ref 3.5–5.1)
Sodium: 135 mmol/L (ref 135–145)
Total Bilirubin: 1.1 mg/dL (ref 0.3–1.2)
Total Protein: 6.6 g/dL (ref 6.5–8.1)

## 2020-05-05 LAB — LACTATE DEHYDROGENASE: LDH: 138 U/L (ref 98–192)

## 2020-05-06 LAB — PROTEIN ELECTROPHORESIS, SERUM
A/G Ratio: 1.3 (ref 0.7–1.7)
Albumin ELP: 3.6 g/dL (ref 2.9–4.4)
Alpha-1-Globulin: 0.2 g/dL (ref 0.0–0.4)
Alpha-2-Globulin: 0.7 g/dL (ref 0.4–1.0)
Beta Globulin: 0.7 g/dL (ref 0.7–1.3)
Gamma Globulin: 1.1 g/dL (ref 0.4–1.8)
Globulin, Total: 2.7 g/dL (ref 2.2–3.9)
M-Spike, %: 0.4 g/dL — ABNORMAL HIGH
Total Protein ELP: 6.3 g/dL (ref 6.0–8.5)

## 2020-05-06 LAB — KAPPA/LAMBDA LIGHT CHAINS
Kappa free light chain: 61.9 mg/L — ABNORMAL HIGH (ref 3.3–19.4)
Kappa, lambda light chain ratio: 3.94 — ABNORMAL HIGH (ref 0.26–1.65)
Lambda free light chains: 15.7 mg/L (ref 5.7–26.3)

## 2020-05-07 ENCOUNTER — Inpatient Hospital Stay (HOSPITAL_BASED_OUTPATIENT_CLINIC_OR_DEPARTMENT_OTHER): Payer: Medicare HMO | Admitting: Hematology

## 2020-05-07 DIAGNOSIS — D472 Monoclonal gammopathy: Secondary | ICD-10-CM

## 2020-05-07 NOTE — Progress Notes (Signed)
Virtual Visit via Telephone Note  I connected with Ricardo Garrett on 05/07/20 at  4:00 PM EDT by telephone and verified that I am speaking with the correct person using two identifiers.   I discussed the limitations, risks, security and privacy concerns of performing an evaluation and management service by telephone and the availability of in person appointments. I also discussed with the patient that there may be a patient responsible charge related to this service. The patient expressed understanding and agreed to proceed.   History of Present Illness: Patient seen in our clinic for IgG kappa monoclonal gammopathy.  He was initially evaluated at the request of Dr. Theador Hawthorne for MGUS.  Last blood work on 01/14/2020 shows M spike of 0.5 g.  Free light chain ratio was 2.8.  Skeletal survey on 01/14/2020 was negative for lytic lesions.   Observations/Objective: He denies having any worsening of shortness of breath on exertion.  Baseline shortness of breath on exertion is stable.  Denies any hemoptysis.  No new onset bone pains.  Assessment and Plan:  1.  IgG kappa MGUS: -Labs from 05/05/2020 was reviewed.  SPEP showed 0.4 g/dL which is stable. -Free light chain ratio increased to 3.94.  Kappa light chains increased to 61.  Creatinine has also increased to 2.41 from 1.8 in March. -The elevated free light chain ratio is probably due to worsening kidney function.  He is on medications which could affect his creatinine. -I plan to repeat myeloma panel in 3 months.  2.  Right lower lobe lung nodule: -I reviewed results of CT chest without contrast on 05/05/2020 which showed right lower lobe 8 mm solid pulmonary nodule, obscured by atelectasis on 11/23/2015 chest CT.  No thoracic adenopathy.  Moderate centrilobular emphysema with mild diffuse bronchial wall thickening suggesting COPD.  Nonspecific patchy subpleural reticulation at the lung bases.  Interstitial lung disease including early UIP is not  excluded. -He quit smoking 30 years ago.  Smoked 1 pack/day for 20 to 30 years.  He also worked as a Nature conservation officer. -Hence I recommended repeating CT scan of the chest without contrast in 6 to 12 months.  3.  Hyperkalemia: -Latest labs show his potassium is 6.0. -He is taking potassium 10 mEq daily.  I have told him to discontinue it.  4.  CKD: -His creatinine has worsened to 2.41 from 1.8 previously. -I have asked him to follow-up with Dr. Theador Hawthorne.   Follow Up Instructions: RTC 3 months with labs 1 week prior.   I discussed the assessment and treatment plan with the patient. The patient was provided an opportunity to ask questions and all were answered. The patient agreed with the plan and demonstrated an understanding of the instructions.   The patient was advised to call back or seek an in-person evaluation if the symptoms worsen or if the condition fails to improve as anticipated.  I provided 22 minutes of non-face-to-face time during this encounter.   Derek Jack, MD

## 2020-05-09 LAB — IMMUNOFIXATION ELECTROPHORESIS
IgA: 46 mg/dL — ABNORMAL LOW (ref 61–437)
IgG (Immunoglobin G), Serum: 1078 mg/dL (ref 603–1613)
IgM (Immunoglobulin M), Srm: 35 mg/dL (ref 15–143)
Total Protein ELP: 6.2 g/dL (ref 6.0–8.5)

## 2020-05-11 ENCOUNTER — Ambulatory Visit (HOSPITAL_COMMUNITY): Payer: Medicare HMO

## 2020-05-11 ENCOUNTER — Encounter (HOSPITAL_COMMUNITY): Payer: Self-pay

## 2020-05-13 DIAGNOSIS — I5022 Chronic systolic (congestive) heart failure: Secondary | ICD-10-CM | POA: Diagnosis not present

## 2020-05-13 DIAGNOSIS — I129 Hypertensive chronic kidney disease with stage 1 through stage 4 chronic kidney disease, or unspecified chronic kidney disease: Secondary | ICD-10-CM | POA: Diagnosis not present

## 2020-05-13 DIAGNOSIS — D472 Monoclonal gammopathy: Secondary | ICD-10-CM | POA: Diagnosis not present

## 2020-05-13 DIAGNOSIS — N170 Acute kidney failure with tubular necrosis: Secondary | ICD-10-CM | POA: Diagnosis not present

## 2020-05-13 DIAGNOSIS — E211 Secondary hyperparathyroidism, not elsewhere classified: Secondary | ICD-10-CM | POA: Diagnosis not present

## 2020-05-13 DIAGNOSIS — E875 Hyperkalemia: Secondary | ICD-10-CM | POA: Diagnosis not present

## 2020-05-13 DIAGNOSIS — R809 Proteinuria, unspecified: Secondary | ICD-10-CM | POA: Diagnosis not present

## 2020-06-02 DIAGNOSIS — I5032 Chronic diastolic (congestive) heart failure: Secondary | ICD-10-CM | POA: Diagnosis not present

## 2020-06-02 DIAGNOSIS — I482 Chronic atrial fibrillation, unspecified: Secondary | ICD-10-CM | POA: Diagnosis not present

## 2020-06-02 DIAGNOSIS — R7301 Impaired fasting glucose: Secondary | ICD-10-CM | POA: Diagnosis not present

## 2020-06-02 DIAGNOSIS — J449 Chronic obstructive pulmonary disease, unspecified: Secondary | ICD-10-CM | POA: Diagnosis not present

## 2020-06-02 DIAGNOSIS — I959 Hypotension, unspecified: Secondary | ICD-10-CM | POA: Diagnosis not present

## 2020-06-02 DIAGNOSIS — N183 Chronic kidney disease, stage 3 unspecified: Secondary | ICD-10-CM | POA: Diagnosis not present

## 2020-06-02 DIAGNOSIS — I130 Hypertensive heart and chronic kidney disease with heart failure and stage 1 through stage 4 chronic kidney disease, or unspecified chronic kidney disease: Secondary | ICD-10-CM | POA: Diagnosis not present

## 2020-06-02 DIAGNOSIS — E782 Mixed hyperlipidemia: Secondary | ICD-10-CM | POA: Diagnosis not present

## 2020-06-04 ENCOUNTER — Telehealth: Payer: Self-pay | Admitting: Cardiovascular Disease

## 2020-06-04 ENCOUNTER — Other Ambulatory Visit (HOSPITAL_COMMUNITY): Payer: Self-pay

## 2020-06-04 NOTE — Telephone Encounter (Signed)
Cassie/Dr.Hall's office would like to discuss Pt's medications   Please call Cassie [phone]    Thanks renee

## 2020-06-04 NOTE — Telephone Encounter (Signed)
I spoke with [facility] and clarified Eliquis dose as 2.5 mg BID, dose reduced on 05/02/2019 by M.Supple, PharmD

## 2020-06-15 DIAGNOSIS — E211 Secondary hyperparathyroidism, not elsewhere classified: Secondary | ICD-10-CM | POA: Diagnosis not present

## 2020-06-15 DIAGNOSIS — I129 Hypertensive chronic kidney disease with stage 1 through stage 4 chronic kidney disease, or unspecified chronic kidney disease: Secondary | ICD-10-CM | POA: Diagnosis not present

## 2020-06-15 DIAGNOSIS — D472 Monoclonal gammopathy: Secondary | ICD-10-CM | POA: Diagnosis not present

## 2020-06-15 DIAGNOSIS — I5022 Chronic systolic (congestive) heart failure: Secondary | ICD-10-CM | POA: Diagnosis not present

## 2020-06-15 DIAGNOSIS — R809 Proteinuria, unspecified: Secondary | ICD-10-CM | POA: Diagnosis not present

## 2020-06-18 DIAGNOSIS — I129 Hypertensive chronic kidney disease with stage 1 through stage 4 chronic kidney disease, or unspecified chronic kidney disease: Secondary | ICD-10-CM | POA: Diagnosis not present

## 2020-06-18 DIAGNOSIS — E211 Secondary hyperparathyroidism, not elsewhere classified: Secondary | ICD-10-CM | POA: Diagnosis not present

## 2020-06-18 DIAGNOSIS — E559 Vitamin D deficiency, unspecified: Secondary | ICD-10-CM | POA: Diagnosis not present

## 2020-06-18 DIAGNOSIS — E875 Hyperkalemia: Secondary | ICD-10-CM | POA: Diagnosis not present

## 2020-06-18 DIAGNOSIS — I5022 Chronic systolic (congestive) heart failure: Secondary | ICD-10-CM | POA: Diagnosis not present

## 2020-06-18 DIAGNOSIS — N1832 Chronic kidney disease, stage 3b: Secondary | ICD-10-CM | POA: Diagnosis not present

## 2020-06-18 DIAGNOSIS — N170 Acute kidney failure with tubular necrosis: Secondary | ICD-10-CM | POA: Diagnosis not present

## 2020-06-18 DIAGNOSIS — R809 Proteinuria, unspecified: Secondary | ICD-10-CM | POA: Diagnosis not present

## 2020-06-24 ENCOUNTER — Ambulatory Visit (INDEPENDENT_AMBULATORY_CARE_PROVIDER_SITE_OTHER): Payer: Medicare HMO | Admitting: *Deleted

## 2020-06-24 DIAGNOSIS — I495 Sick sinus syndrome: Secondary | ICD-10-CM | POA: Diagnosis not present

## 2020-06-25 LAB — CUP PACEART REMOTE DEVICE CHECK
Battery Remaining Longevity: 127 mo
Battery Remaining Percentage: 95.5 %
Battery Voltage: 3.02 V
Brady Statistic RV Percent Paced: 35 %
Date Time Interrogation Session: 20210825040015
Implantable Lead Implant Date: 20190128
Implantable Lead Implant Date: 20190128
Implantable Lead Location: 753859
Implantable Lead Location: 753860
Implantable Pulse Generator Implant Date: 20190128
Lead Channel Impedance Value: 530 Ohm
Lead Channel Pacing Threshold Amplitude: 0.75 V
Lead Channel Pacing Threshold Pulse Width: 0.5 ms
Lead Channel Sensing Intrinsic Amplitude: 12 mV
Lead Channel Setting Pacing Amplitude: 2.5 V
Lead Channel Setting Pacing Pulse Width: 0.5 ms
Lead Channel Setting Sensing Sensitivity: 2 mV
Pulse Gen Model: 2272
Pulse Gen Serial Number: 8982069

## 2020-06-29 DIAGNOSIS — R69 Illness, unspecified: Secondary | ICD-10-CM | POA: Diagnosis not present

## 2020-06-29 DIAGNOSIS — I5032 Chronic diastolic (congestive) heart failure: Secondary | ICD-10-CM | POA: Diagnosis not present

## 2020-06-29 DIAGNOSIS — R7301 Impaired fasting glucose: Secondary | ICD-10-CM | POA: Diagnosis not present

## 2020-06-29 DIAGNOSIS — M1990 Unspecified osteoarthritis, unspecified site: Secondary | ICD-10-CM | POA: Diagnosis not present

## 2020-06-29 DIAGNOSIS — E440 Moderate protein-calorie malnutrition: Secondary | ICD-10-CM | POA: Diagnosis not present

## 2020-06-29 DIAGNOSIS — N182 Chronic kidney disease, stage 2 (mild): Secondary | ICD-10-CM | POA: Diagnosis not present

## 2020-06-29 DIAGNOSIS — L239 Allergic contact dermatitis, unspecified cause: Secondary | ICD-10-CM | POA: Diagnosis not present

## 2020-06-29 DIAGNOSIS — I959 Hypotension, unspecified: Secondary | ICD-10-CM | POA: Diagnosis not present

## 2020-06-29 DIAGNOSIS — E782 Mixed hyperlipidemia: Secondary | ICD-10-CM | POA: Diagnosis not present

## 2020-06-29 DIAGNOSIS — J449 Chronic obstructive pulmonary disease, unspecified: Secondary | ICD-10-CM | POA: Diagnosis not present

## 2020-06-29 DIAGNOSIS — N289 Disorder of kidney and ureter, unspecified: Secondary | ICD-10-CM | POA: Diagnosis not present

## 2020-06-29 DIAGNOSIS — I509 Heart failure, unspecified: Secondary | ICD-10-CM | POA: Diagnosis not present

## 2020-06-29 NOTE — Progress Notes (Signed)
Remote pacemaker transmission.   

## 2020-07-02 DIAGNOSIS — R7301 Impaired fasting glucose: Secondary | ICD-10-CM | POA: Diagnosis not present

## 2020-07-02 DIAGNOSIS — J449 Chronic obstructive pulmonary disease, unspecified: Secondary | ICD-10-CM | POA: Diagnosis not present

## 2020-07-02 DIAGNOSIS — E785 Hyperlipidemia, unspecified: Secondary | ICD-10-CM | POA: Diagnosis not present

## 2020-07-02 DIAGNOSIS — N183 Chronic kidney disease, stage 3 unspecified: Secondary | ICD-10-CM | POA: Diagnosis not present

## 2020-07-02 DIAGNOSIS — D1430 Benign neoplasm of unspecified bronchus and lung: Secondary | ICD-10-CM | POA: Diagnosis not present

## 2020-07-02 DIAGNOSIS — I5032 Chronic diastolic (congestive) heart failure: Secondary | ICD-10-CM | POA: Diagnosis not present

## 2020-07-02 DIAGNOSIS — N1832 Chronic kidney disease, stage 3b: Secondary | ICD-10-CM | POA: Diagnosis not present

## 2020-07-02 DIAGNOSIS — D472 Monoclonal gammopathy: Secondary | ICD-10-CM | POA: Diagnosis not present

## 2020-07-02 DIAGNOSIS — E782 Mixed hyperlipidemia: Secondary | ICD-10-CM | POA: Diagnosis not present

## 2020-07-02 DIAGNOSIS — M79604 Pain in right leg: Secondary | ICD-10-CM | POA: Diagnosis not present

## 2020-07-02 DIAGNOSIS — I129 Hypertensive chronic kidney disease with stage 1 through stage 4 chronic kidney disease, or unspecified chronic kidney disease: Secondary | ICD-10-CM | POA: Diagnosis not present

## 2020-07-02 DIAGNOSIS — E875 Hyperkalemia: Secondary | ICD-10-CM | POA: Diagnosis not present

## 2020-07-02 DIAGNOSIS — I959 Hypotension, unspecified: Secondary | ICD-10-CM | POA: Diagnosis not present

## 2020-07-02 DIAGNOSIS — I482 Chronic atrial fibrillation, unspecified: Secondary | ICD-10-CM | POA: Diagnosis not present

## 2020-07-17 DIAGNOSIS — I5022 Chronic systolic (congestive) heart failure: Secondary | ICD-10-CM | POA: Diagnosis not present

## 2020-07-17 DIAGNOSIS — R809 Proteinuria, unspecified: Secondary | ICD-10-CM | POA: Diagnosis not present

## 2020-07-17 DIAGNOSIS — I129 Hypertensive chronic kidney disease with stage 1 through stage 4 chronic kidney disease, or unspecified chronic kidney disease: Secondary | ICD-10-CM | POA: Diagnosis not present

## 2020-07-17 DIAGNOSIS — E211 Secondary hyperparathyroidism, not elsewhere classified: Secondary | ICD-10-CM | POA: Diagnosis not present

## 2020-07-17 DIAGNOSIS — N170 Acute kidney failure with tubular necrosis: Secondary | ICD-10-CM | POA: Diagnosis not present

## 2020-07-17 DIAGNOSIS — N1832 Chronic kidney disease, stage 3b: Secondary | ICD-10-CM | POA: Diagnosis not present

## 2020-07-17 DIAGNOSIS — E559 Vitamin D deficiency, unspecified: Secondary | ICD-10-CM | POA: Diagnosis not present

## 2020-07-23 DIAGNOSIS — I129 Hypertensive chronic kidney disease with stage 1 through stage 4 chronic kidney disease, or unspecified chronic kidney disease: Secondary | ICD-10-CM | POA: Diagnosis not present

## 2020-07-23 DIAGNOSIS — R809 Proteinuria, unspecified: Secondary | ICD-10-CM | POA: Diagnosis not present

## 2020-07-23 DIAGNOSIS — N1832 Chronic kidney disease, stage 3b: Secondary | ICD-10-CM | POA: Diagnosis not present

## 2020-07-23 DIAGNOSIS — N170 Acute kidney failure with tubular necrosis: Secondary | ICD-10-CM | POA: Diagnosis not present

## 2020-07-23 DIAGNOSIS — I5022 Chronic systolic (congestive) heart failure: Secondary | ICD-10-CM | POA: Diagnosis not present

## 2020-08-13 ENCOUNTER — Inpatient Hospital Stay (HOSPITAL_COMMUNITY): Payer: Medicare HMO

## 2020-08-13 DIAGNOSIS — R7301 Impaired fasting glucose: Secondary | ICD-10-CM | POA: Diagnosis not present

## 2020-08-13 DIAGNOSIS — I959 Hypotension, unspecified: Secondary | ICD-10-CM | POA: Diagnosis not present

## 2020-08-13 DIAGNOSIS — J449 Chronic obstructive pulmonary disease, unspecified: Secondary | ICD-10-CM | POA: Diagnosis not present

## 2020-08-13 DIAGNOSIS — I482 Chronic atrial fibrillation, unspecified: Secondary | ICD-10-CM | POA: Diagnosis not present

## 2020-08-13 DIAGNOSIS — E782 Mixed hyperlipidemia: Secondary | ICD-10-CM | POA: Diagnosis not present

## 2020-08-13 DIAGNOSIS — I5032 Chronic diastolic (congestive) heart failure: Secondary | ICD-10-CM | POA: Diagnosis not present

## 2020-08-13 DIAGNOSIS — N183 Chronic kidney disease, stage 3 unspecified: Secondary | ICD-10-CM | POA: Diagnosis not present

## 2020-08-20 ENCOUNTER — Ambulatory Visit (HOSPITAL_COMMUNITY): Payer: Medicare HMO | Admitting: Hematology

## 2020-09-16 DIAGNOSIS — Z23 Encounter for immunization: Secondary | ICD-10-CM | POA: Diagnosis not present

## 2020-09-16 DIAGNOSIS — M25551 Pain in right hip: Secondary | ICD-10-CM | POA: Diagnosis not present

## 2020-09-17 DIAGNOSIS — I5032 Chronic diastolic (congestive) heart failure: Secondary | ICD-10-CM | POA: Diagnosis not present

## 2020-09-17 DIAGNOSIS — I959 Hypotension, unspecified: Secondary | ICD-10-CM | POA: Diagnosis not present

## 2020-09-17 DIAGNOSIS — I482 Chronic atrial fibrillation, unspecified: Secondary | ICD-10-CM | POA: Diagnosis not present

## 2020-09-17 DIAGNOSIS — R7301 Impaired fasting glucose: Secondary | ICD-10-CM | POA: Diagnosis not present

## 2020-09-17 DIAGNOSIS — N183 Chronic kidney disease, stage 3 unspecified: Secondary | ICD-10-CM | POA: Diagnosis not present

## 2020-09-17 DIAGNOSIS — E782 Mixed hyperlipidemia: Secondary | ICD-10-CM | POA: Diagnosis not present

## 2020-09-17 DIAGNOSIS — J449 Chronic obstructive pulmonary disease, unspecified: Secondary | ICD-10-CM | POA: Diagnosis not present

## 2020-09-23 ENCOUNTER — Ambulatory Visit (INDEPENDENT_AMBULATORY_CARE_PROVIDER_SITE_OTHER): Payer: Medicare HMO

## 2020-09-23 DIAGNOSIS — I495 Sick sinus syndrome: Secondary | ICD-10-CM

## 2020-09-23 LAB — CUP PACEART REMOTE DEVICE CHECK
Battery Remaining Longevity: 127 mo
Battery Remaining Percentage: 95.5 %
Battery Voltage: 3.01 V
Brady Statistic RV Percent Paced: 31 %
Date Time Interrogation Session: 20211124062122
Implantable Lead Implant Date: 20190128
Implantable Lead Implant Date: 20190128
Implantable Lead Location: 753859
Implantable Lead Location: 753860
Implantable Pulse Generator Implant Date: 20190128
Lead Channel Impedance Value: 530 Ohm
Lead Channel Pacing Threshold Amplitude: 0.75 V
Lead Channel Pacing Threshold Pulse Width: 0.5 ms
Lead Channel Sensing Intrinsic Amplitude: 11.3 mV
Lead Channel Setting Pacing Amplitude: 2.5 V
Lead Channel Setting Pacing Pulse Width: 0.5 ms
Lead Channel Setting Sensing Sensitivity: 2 mV
Pulse Gen Model: 2272
Pulse Gen Serial Number: 8982069

## 2020-10-02 NOTE — Progress Notes (Signed)
Remote pacemaker transmission.   

## 2020-10-08 DIAGNOSIS — R69 Illness, unspecified: Secondary | ICD-10-CM | POA: Diagnosis not present

## 2020-10-08 DIAGNOSIS — M1990 Unspecified osteoarthritis, unspecified site: Secondary | ICD-10-CM | POA: Diagnosis not present

## 2020-10-08 DIAGNOSIS — I5032 Chronic diastolic (congestive) heart failure: Secondary | ICD-10-CM | POA: Diagnosis not present

## 2020-10-08 DIAGNOSIS — E782 Mixed hyperlipidemia: Secondary | ICD-10-CM | POA: Diagnosis not present

## 2020-10-08 DIAGNOSIS — N289 Disorder of kidney and ureter, unspecified: Secondary | ICD-10-CM | POA: Diagnosis not present

## 2020-10-08 DIAGNOSIS — I959 Hypotension, unspecified: Secondary | ICD-10-CM | POA: Diagnosis not present

## 2020-10-08 DIAGNOSIS — E559 Vitamin D deficiency, unspecified: Secondary | ICD-10-CM | POA: Diagnosis not present

## 2020-10-08 DIAGNOSIS — I509 Heart failure, unspecified: Secondary | ICD-10-CM | POA: Diagnosis not present

## 2020-10-08 DIAGNOSIS — E440 Moderate protein-calorie malnutrition: Secondary | ICD-10-CM | POA: Diagnosis not present

## 2020-10-08 DIAGNOSIS — J449 Chronic obstructive pulmonary disease, unspecified: Secondary | ICD-10-CM | POA: Diagnosis not present

## 2020-10-08 DIAGNOSIS — R7301 Impaired fasting glucose: Secondary | ICD-10-CM | POA: Diagnosis not present

## 2020-10-08 DIAGNOSIS — N1832 Chronic kidney disease, stage 3b: Secondary | ICD-10-CM | POA: Diagnosis not present

## 2020-10-08 DIAGNOSIS — Z0001 Encounter for general adult medical examination with abnormal findings: Secondary | ICD-10-CM | POA: Diagnosis not present

## 2020-10-12 NOTE — Progress Notes (Signed)
Date:  10/19/2020   ID:  [name], DOB Dec 08, 1945, MRN [id number]  Provider Location: Office  PCP:  [name], MD  Cardiologist:  [name], MD   Electrophysiologist:  [name], MD   Evaluation Performed:  Follow-Up Visit  Chief Complaint:  Pacer/ Afib  History of Present Illness:    74 y.o. CHF EF 35-40%, PAF, HTN, HLD Had abnormal stress test and set up for cath 11/23/17 with orbital atherectomy and stenting of mid circumflex with DES and PCI small OM branch done by Dr Veronda Prude. Plan for DAT month then Eliquis and Plavix. 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    No chest pain   Still seems to be having ETOH/Marijuana from time to time but not much Compliant with meds Walks dogs in am   PACART review normal function follows with Dr Lovena Le   Has had Covid vaccine   Primary issue is left leg and hip pain   The patient does not have symptoms concerning for COVID-19 infection (fever, chills, cough, or new shortness of breath).    Past Medical History:  Diagnosis Date  . Arthritis    "finger on right hand" (11/23/2017)  . Broken arm ~ 1952   "no OR; just reset it; not sure which side"  . CAD (coronary artery disease)   . CHF (congestive heart failure) ([place])    a. 12/2015: echo showing reduced EF of 35-40% b. NST: 09/2017: scar with peri-infarct ischemia, intermediate-risk study  . COPD (chronic obstructive pulmonary disease) ([place])    "was on RX when he had CHF; not on anything now" (11/23/2017)  . High cholesterol   . HOH (hard of hearing)   . Hypertension   . Persistent atrial fibrillation ([place])    a. s/p DCCV in 03/2016   Past Surgical History:  Procedure Laterality Date  . CARDIAC CATHETERIZATION  11/23/2017  . CARDIOVERSION N/A 04/05/2016   Procedure:  CARDIOVERSION;  Surgeon: Josue Hector, MD;  [facility];  Service: Cardiovascular;  Laterality: N/A;  . COMPRESSION HIP SCREW Right 03/15/2013   Procedure: COMPRESSION HIP;  Surgeon: Sanjuana Kava, MD;  Location: [facility] ORS;  Service: Orthopedics;  Laterality: Right;  . CORONARY ATHERECTOMY N/A 11/24/2017   Procedure: CORONARY ATHERECTOMY;  Surgeon: Burnell Blanks, MD;  Location: [place] CV LAB;  Service: Cardiovascular;  Laterality: N/A;  . CORONARY BALLOON ANGIOPLASTY N/A 11/24/2017   Procedure: CORONARY BALLOON ANGIOPLASTY;  Surgeon: Burnell Blanks, MD;  Location: [place] CV LAB;  Service: Cardiovascular;  Laterality: N/A;  . CORONARY STENT INTERVENTION N/A 11/24/2017   Procedure: CORONARY STENT INTERVENTION;  Surgeon: Burnell Blanks, MD;  Location: [place] CV LAB;  Service: Cardiovascular;  Laterality: N/A;  . FRACTURE SURGERY    . PACEMAKER IMPLANT N/A 11/27/2017   Procedure: PACEMAKER IMPLANT;  Surgeon: Evans Lance, MD;  Location: [place] CV LAB;  Service: Cardiovascular;  Laterality: N/A;  . RIGHT/LEFT HEART CATH AND CORONARY ANGIOGRAPHY N/A 11/23/2017   Procedure: RIGHT/LEFT HEART CATH AND CORONARY ANGIOGRAPHY;  Surgeon: Burnell Blanks, MD;  Location: [place] CV LAB;  Service: Cardiovascular;  Laterality: N/A;  . SKIN GRAFT Left    from right thigh  . TONSILLECTOMY       Current Meds  Medication Sig  . apixaban (  ELIQUIS) 2.5 MG TABS tablet Take 1 tablet (2.5 mg total) by mouth 2 (two) times daily.  Marland Kitchen atorvastatin (LIPITOR) 80 MG tablet Take 1 tablet (80 mg total) by mouth daily.  . clopidogrel (PLAVIX) 75 MG tablet Take 1 tablet by mouth once daily  . digoxin (LANOXIN) 0.125 MG tablet Take 1 tablet (125 mcg total) by mouth daily.  . furosemide (LASIX) 40 MG tablet Take 1 tablet (40 mg total) by mouth daily.  Marland Kitchen HYDROcodone-acetaminophen (NORCO/VICODIN) 5-325 MG tablet One tablet by mouth every six hours as needed for  pain.  Marland Kitchen lisinopril (ZESTRIL) 40 MG tablet Take 40 mg by mouth daily.  Marland Kitchen LOKELMA 5 g packet Take 1 packet by mouth daily.  Marland Kitchen losartan (COZAAR) 50 MG tablet Take 1 tablet by mouth daily.  . metoprolol succinate (TOPROL-XL) 25 MG 24 hr tablet TAKE 1/2 (ONE-HALF) TABLET BY MOUTH IN THE MORNING AND 1 TABLET IN THE EVENING  . potassium chloride (KLOR-CON) 10 MEQ tablet Take 10 mEq by mouth daily.  . tamsulosin (FLOMAX) 0.4 MG CAPS capsule Take 0.4 mg by mouth daily.     Allergies:   Patient has no known allergies.   Social History   Tobacco Use  . Smoking status: Former Smoker    Packs/day: 1.00    Years: 20.00    Pack years: 20.00    Types: Cigarettes    Quit date: 11/01/1979    Years since quitting: 40.9  . Smokeless tobacco: Never Used  Vaping Use  . Vaping Use: Never used  Substance Use Topics  . Alcohol use: Yes    Comment: 11/23/2017 "beer once/month maybe"  . Drug use: Yes    Types: Marijuana    Comment: 11/23/2017 "nothing for years"     Family Hx: The patient's family history includes Diabetes in his mother; Heart attack in his father.  ROS:   Please see the history of present illness.     All other systems reviewed and are negative.   Prior CV studies:   The following studies were reviewed today:  PACEART 12/25/18 Echo 11/25/17 EF 50-55%  Labs/Other Tests and Data Reviewed:    EKG:   07/20/18  afib rate 68 intermittent V pacing   Recent Labs: 05/05/2020: ALT 19; BUN 48; Creatinine, Ser 2.41; Hemoglobin 14.0; Platelets 314; Potassium 6.0; Sodium 135   Recent Lipid Panel Lab Results  Component Value Date/Time   CHOL 193 02/12/2016 12:00 AM   TRIG 196 02/12/2016 12:00 AM   HDL 29 02/12/2016 12:00 AM   CHOLHDL 6.7 02/12/2016 12:00 AM   LDLCALC 125 02/12/2016 12:00 AM    Wt Readings from Last 3 Encounters:  10/19/20 64.5 kg  04/15/20 59.1 kg  04/07/20 61.9 kg     Objective:    Vital Signs:  BP (!) 168/88   Pulse 79   Resp 16   Ht 5\' 8"  (1.727 m)   Wt  64.5 kg   BMI 21.62 kg/m    Affect appropriate Chronically ill elderly white male  HEENT: very hard of hearing  Neck supple with no adenopathy JVP normal no bruits no thyromegaly Lungs rhonchi / exp wheezing  Heart:  S1/S2 no murmur, no rub, gallop or click PMI normal pacer under left clavicle  Abdomen: benighn, BS positve, no tenderness, no AAA no bruit.  No HSM or HJR Distal pulses intact with no bruits No edema Neuro non-focal Skin warm and dry No muscular weakness   ASSESSMENT & PLAN:    1.  Chronic Combined Systolic and Diastolic CHF: most recent EF 11/25/17 TTE  50-55% echo  improved continue medical Rx will update echo   2. Persistent Atrial Fibrillation  Failed [facility] 03/2016 continue eliquis .   3. HTN:  Well controlled.  Continue current medications and low sodium Dash type diet.    4. HLD: on statin labs with primary   5. Pacer:  Normal function by PaceArt f/u Dr Lovena Le   6. Ortho:  F/u Dr Luna Glasgow for x-rays and consider referral back to PT/OT  7. CAD: post stenting mid circumflex 11/23/17 On plavix and eliquis no ASA No angina stable   8. COPD:  History of smoking and ongoing marijuana use lung cancer screening CT 05/05/20 with 8 mm RLL nodule F/u in a year   9. CRF:  Cr 2.4 f/u Dr Theador Hawthorne   10. Oncology:  IgG kappa MGUS negative bone survey myeloma panel per DR Delton Coombes  COVID-19 Education: The signs and symptoms of COVID-19 were discussed with the patient and how to seek care for testing (follow up with PCP or arrange E-visit).  The importance of social distancing was discussed today.    Medication Adjustments/Labs and Tests Ordered: Current medicines are reviewed at length with the patient today.  Concerns regarding medicines are outlined above.   Tests Ordered:  Lung cancer screening CT July 2022  TTE for ischemic DCM   Medication Changes: No orders of the defined types were placed in this encounter.   Disposition:  Follow up in 6 months    Signed, [name], MD  10/19/2020 11:02 AM    [place]

## 2020-10-19 ENCOUNTER — Other Ambulatory Visit: Payer: Self-pay

## 2020-10-19 ENCOUNTER — Ambulatory Visit: Payer: Medicare HMO | Admitting: Cardiovascular Disease

## 2020-10-19 ENCOUNTER — Encounter: Payer: Self-pay | Admitting: Cardiovascular Disease

## 2020-10-19 VITALS — BP 168/88 | HR 79 | Resp 16 | Ht 68.0 in | Wt 142.2 lb

## 2020-10-19 DIAGNOSIS — F17200 Nicotine dependence, unspecified, uncomplicated: Secondary | ICD-10-CM

## 2020-10-19 DIAGNOSIS — I5021 Acute systolic (congestive) heart failure: Secondary | ICD-10-CM | POA: Diagnosis not present

## 2020-10-19 DIAGNOSIS — R69 Illness, unspecified: Secondary | ICD-10-CM | POA: Diagnosis not present

## 2020-10-19 NOTE — Patient Instructions (Signed)
Medication Instructions:  Your physician recommends that you continue on your current medications as directed. Please refer to the Current Medication list given to you today.  *If you need a refill on your cardiac medications before your next appointment, please call your pharmacy*   Lab Work: NONE   If you have labs (blood work) drawn today and your tests are completely normal, you will receive your results only by: Marland Kitchen MyChart Message (if you have MyChart) OR . A paper copy in the mail If you have any lab test that is abnormal or we need to change your treatment, we will call you to review the results.   Testing/Procedures: Your physician has requested that you have an echocardiogram. Echocardiography is a painless test that uses sound waves to create images of your heart. It provides your doctor with information about the size and shape of your heart and how well your heart's chambers and valves are working. This procedure takes approximately one hour. There are no restrictions for this procedure.  Non-Cardiac CT scanning, (CAT scanning), is a noninvasive, special x-ray that produces cross-sectional images of the body using x-rays and a computer. CT scans help physicians diagnose and treat medical conditions. For some CT exams, a contrast material is used to enhance visibility in the area of the body being studied. CT scans provide greater clarity and reveal more details than regular x-ray exams.   Follow-Up: At [facility], you and your health needs are our priority.  As part of our continuing mission to provide you with exceptional heart care, we have created designated Provider Care Teams.  These Care Teams include your primary Cardiologist (physician) and Advanced Practice Providers (APPs -  Physician Assistants and Nurse Practitioners) who all work together to provide you with the care you need, when you need it.  We recommend signing up for the patient portal called "MyChart".  Sign  up information is provided on this After Visit Summary.  MyChart is used to connect with patients for Virtual Visits (Telemedicine).  Patients are able to view lab/test results, encounter notes, upcoming appointments, etc.  Non-urgent messages can be sent to your provider as well.   To learn more about what you can do with MyChart, go to [email or web address].    Your next appointment:   6 month(s)  The format for your next appointment:   In Person  Provider:   [name], MD   Other Instructions Thank you for choosing [facility]!

## 2020-10-28 ENCOUNTER — Encounter: Payer: Self-pay | Admitting: Internal Medicine

## 2020-10-28 ENCOUNTER — Ambulatory Visit: Payer: Medicare HMO | Admitting: Internal Medicine

## 2020-10-28 ENCOUNTER — Other Ambulatory Visit: Payer: Self-pay

## 2020-10-28 VITALS — BP 130/78 | HR 66 | Ht 68.0 in | Wt 137.0 lb

## 2020-10-28 DIAGNOSIS — I4819 Other persistent atrial fibrillation: Secondary | ICD-10-CM

## 2020-10-28 DIAGNOSIS — I5042 Chronic combined systolic (congestive) and diastolic (congestive) heart failure: Secondary | ICD-10-CM | POA: Diagnosis not present

## 2020-10-28 NOTE — Patient Instructions (Signed)
Medication Instructions:  Your physician recommends that you continue on your current medications as directed. Please refer to the Current Medication list given to you today.  *If you need a refill on your cardiac medications before your next appointment, please call your pharmacy*   Lab Work: NONE   If you have labs (blood work) drawn today and your tests are completely normal, you will receive your results only by: . MyChart Message (if you have MyChart) OR . A paper copy in the mail If you have any lab test that is abnormal or we need to change your treatment, we will call you to review the results.   Testing/Procedures: NONE    Follow-Up: At [facility], you and your health needs are our priority.  As part of our continuing mission to provide you with exceptional heart care, we have created designated Provider Care Teams.  These Care Teams include your primary Cardiologist (physician) and Advanced Practice Providers (APPs -  Physician Assistants and Nurse Practitioners) who all work together to provide you with the care you need, when you need it.  We recommend signing up for the patient portal called "MyChart".  Sign up information is provided on this After Visit Summary.  MyChart is used to connect with patients for Virtual Visits (Telemedicine).  Patients are able to view lab/test results, encounter notes, upcoming appointments, etc.  Non-urgent messages can be sent to your provider as well.   To learn more about what you can do with MyChart, go to [email or web address].    Your next appointment:   1 year(s)  The format for your next appointment:   In Person  Provider:   Gregg Taylor, MD   Other Instructions Thank you for choosing [facility]!    

## 2020-10-28 NOTE — Progress Notes (Signed)
HPI Mr. Maffei returns today for followup. He is a 74 yo man with a h/o sinus node dysfunction and heart block, s/p PPM insertion. He has broken his right leg. He had surgery. He is limited by his right leg with ambulation. He has not had palpitations. He denies anginal symptoms. He notes that his hearing has gotten worse but he does not like to wear his hearing aid. No Known Allergies   Current Outpatient Medications  Medication Sig Dispense Refill  . apixaban (ELIQUIS) 2.5 MG TABS tablet Take 1 tablet (2.5 mg total) by mouth 2 (two) times daily. 60 tablet 5  . atorvastatin (LIPITOR) 80 MG tablet Take 1 tablet (80 mg total) by mouth daily. 90 tablet 3  . clopidogrel (PLAVIX) 75 MG tablet Take 1 tablet by mouth once daily 90 tablet 3  . digoxin (LANOXIN) 0.125 MG tablet Take 1 tablet (125 mcg total) by mouth daily. 90 tablet 3  . furosemide (LASIX) 40 MG tablet Take 1 tablet (40 mg total) by mouth daily. 30 tablet 6  . HYDROcodone-acetaminophen (NORCO/VICODIN) 5-325 MG tablet One tablet by mouth every six hours as needed for pain. 56 tablet 0  . lisinopril (ZESTRIL) 40 MG tablet Take 40 mg by mouth daily.    Marland Kitchen LOKELMA 5 g packet Take 1 packet by mouth daily.    Marland Kitchen losartan (COZAAR) 50 MG tablet Take 1 tablet by mouth daily.    . metoprolol succinate (TOPROL-XL) 25 MG 24 hr tablet TAKE 1/2 (ONE-HALF) TABLET BY MOUTH IN THE MORNING AND 1 TABLET IN THE EVENING 135 tablet 3  . potassium chloride (KLOR-CON) 10 MEQ tablet Take 10 mEq by mouth daily.    . tamsulosin (FLOMAX) 0.4 MG CAPS capsule Take 0.4 mg by mouth daily.     No current facility-administered medications for this visit.     Past Medical History:  Diagnosis Date  . Arthritis    "finger on right hand" (11/23/2017)  . Broken arm ~ 1952   "no OR; just reset it; not sure which side"  . CAD (coronary artery disease)   . CHF (congestive heart failure) (HCC)    a. 12/2015: echo showing reduced EF of 35-40% b. NST: 09/2017:  scar with peri-infarct ischemia, intermediate-risk study  . COPD (chronic obstructive pulmonary disease) (HCC)    "was on RX when he had CHF; not on anything now" (11/23/2017)  . High cholesterol   . HOH (hard of hearing)   . Hypertension   . Persistent atrial fibrillation (HCC)    a. s/p DCCV in 03/2016    ROS:   All systems reviewed and negative except as noted in the HPI.   Past Surgical History:  Procedure Laterality Date  . CARDIAC CATHETERIZATION  11/23/2017  . CARDIOVERSION N/A 04/05/2016   Procedure: CARDIOVERSION;  Surgeon: Wendall Stade, MD;  [facility];  Service: Cardiovascular;  Laterality: N/A;  . COMPRESSION HIP SCREW Right 03/15/2013   Procedure: COMPRESSION HIP;  Surgeon: Darreld Mclean, MD;  Location: [facility] ORS;  Service: Orthopedics;  Laterality: Right;  . CORONARY ATHERECTOMY N/A 11/24/2017   Procedure: CORONARY ATHERECTOMY;  Surgeon: Kathleene Hazel, MD;  [facility];  Service: Cardiovascular;  Laterality: N/A;  . CORONARY BALLOON ANGIOPLASTY N/A 11/24/2017   Procedure: CORONARY BALLOON ANGIOPLASTY;  Surgeon: Kathleene Hazel, MD;  [facility]: [facility];  Service: Cardiovascular;  Laterality: N/A;  . CORONARY STENT INTERVENTION N/A 11/24/2017   Procedure: CORONARY  STENT INTERVENTION;  Surgeon: Burnell Blanks, MD;  Location: [place] CV LAB;  Service: Cardiovascular;  Laterality: N/A;  . FRACTURE SURGERY    . PACEMAKER IMPLANT N/A 11/27/2017   Procedure: PACEMAKER IMPLANT;  Surgeon: Evans Lance, MD;  Location: [place] CV LAB;  Service: Cardiovascular;  Laterality: N/A;  . RIGHT/LEFT HEART CATH AND CORONARY ANGIOGRAPHY N/A 11/23/2017   Procedure: RIGHT/LEFT HEART CATH AND CORONARY ANGIOGRAPHY;  Surgeon: Burnell Blanks, MD;  Location: [place] CV LAB;  Service: Cardiovascular;  Laterality: N/A;  . SKIN GRAFT Left    from right thigh  . TONSILLECTOMY       Family History  Problem Relation  Age of Onset  . Diabetes Mother   . Heart attack Father      Social History   Socioeconomic History  . Marital status: Married    Spouse name: Not on file  . Number of children: Not on file  . Years of education: Not on file  . Highest education level: Not on file  Occupational History  . Not on file  Tobacco Use  . Smoking status: Former Smoker    Packs/day: 1.00    Years: 20.00    Pack years: 20.00    Types: Cigarettes    Quit date: 11/01/1979    Years since quitting: 41.0  . Smokeless tobacco: Never Used  Vaping Use  . Vaping Use: Never used  Substance and Sexual Activity  . Alcohol use: Yes    Comment: 11/23/2017 "beer once/month maybe"  . Drug use: Yes    Types: Marijuana    Comment: 11/23/2017 "nothing for years"  . Sexual activity: Yes    Birth control/protection: None  Other Topics Concern  . Not on file  Social History Narrative  . Not on file   Social Determinants of Health   Financial Resource Strain: Low Risk   . Difficulty of Paying Living Expenses: Not very hard  Food Insecurity: No Food Insecurity  . Worried About Charity fundraiser in the Last Year: Never true  . Ran Out of Food in the Last Year: Never true  Transportation Needs: No Transportation Needs  . Lack of Transportation (Medical): No  . Lack of Transportation (Non-Medical): No  Physical Activity: Inactive  . Days of Exercise per Week: 0 days  . Minutes of Exercise per Session: 0 min  Stress: No Stress Concern Present  . Feeling of Stress : Not at all  Social Connections: Moderately Isolated  . Frequency of Communication with Friends and Family: Twice a week  . Frequency of Social Gatherings with Friends and Family: Twice a week  . Attends Religious Services: Never  . Active Member of Clubs or Organizations: No  . Attends Archivist Meetings: Never  . Marital Status: Married  Human resources officer Violence: Unknown  . Fear of Current or Ex-Partner: Patient refused  .  Emotionally Abused: Patient refused  . Physically Abused: Patient refused  . Sexually Abused: Patient refused     BP 130/78   Pulse 66   Ht 5\' 8"  (1.727 m)   Wt 137 lb (62.1 kg)   SpO2 98%   BMI 20.83 kg/m   Physical Exam:  Well appearing NAD HEENT: Unremarkable Neck:  No JVD, no thyromegally Lymphatics:  No adenopathy Back:  No CVA tenderness Lungs:  Clear with no wheezes HEART:  Regular rate rhythm, no murmurs, no rubs, no clicks Abd:  soft, positive bowel sounds, no organomegally, no rebound, no  guarding Ext:  2 plus pulses, no edema, no cyanosis, no clubbing Skin:  No rashes no nodules Neuro:  CN II through XII intact, motor grossly intact   DEVICE  Normal device function.  See PaceArt for details.   Assess/Plan: 1. Atrial fib - his VR is well controlled. No change in his meds. 2. Heart block - he is pacing about a 1/3 of the time.  3. HTN - his bp is well controlled. 4. PPM -his St. Jude PPM is working normally. He has over 10 years of battery longevity.  Salome Spotted.

## 2020-10-29 DIAGNOSIS — E211 Secondary hyperparathyroidism, not elsewhere classified: Secondary | ICD-10-CM | POA: Diagnosis not present

## 2020-10-29 DIAGNOSIS — N1832 Chronic kidney disease, stage 3b: Secondary | ICD-10-CM | POA: Diagnosis not present

## 2020-10-29 DIAGNOSIS — R809 Proteinuria, unspecified: Secondary | ICD-10-CM | POA: Diagnosis not present

## 2020-10-29 DIAGNOSIS — N170 Acute kidney failure with tubular necrosis: Secondary | ICD-10-CM | POA: Diagnosis not present

## 2020-10-29 DIAGNOSIS — I129 Hypertensive chronic kidney disease with stage 1 through stage 4 chronic kidney disease, or unspecified chronic kidney disease: Secondary | ICD-10-CM | POA: Diagnosis not present

## 2020-10-29 DIAGNOSIS — I5022 Chronic systolic (congestive) heart failure: Secondary | ICD-10-CM | POA: Diagnosis not present

## 2020-10-29 DIAGNOSIS — E559 Vitamin D deficiency, unspecified: Secondary | ICD-10-CM | POA: Diagnosis not present

## 2020-10-30 DIAGNOSIS — I482 Chronic atrial fibrillation, unspecified: Secondary | ICD-10-CM | POA: Diagnosis not present

## 2020-10-30 DIAGNOSIS — I5032 Chronic diastolic (congestive) heart failure: Secondary | ICD-10-CM | POA: Diagnosis not present

## 2020-10-30 DIAGNOSIS — R7301 Impaired fasting glucose: Secondary | ICD-10-CM | POA: Diagnosis not present

## 2020-10-30 DIAGNOSIS — I959 Hypotension, unspecified: Secondary | ICD-10-CM | POA: Diagnosis not present

## 2020-10-30 DIAGNOSIS — J449 Chronic obstructive pulmonary disease, unspecified: Secondary | ICD-10-CM | POA: Diagnosis not present

## 2020-10-30 DIAGNOSIS — E782 Mixed hyperlipidemia: Secondary | ICD-10-CM | POA: Diagnosis not present

## 2020-11-02 DIAGNOSIS — E875 Hyperkalemia: Secondary | ICD-10-CM | POA: Diagnosis not present

## 2020-11-02 DIAGNOSIS — E785 Hyperlipidemia, unspecified: Secondary | ICD-10-CM | POA: Diagnosis not present

## 2020-11-02 DIAGNOSIS — J449 Chronic obstructive pulmonary disease, unspecified: Secondary | ICD-10-CM | POA: Diagnosis not present

## 2020-11-02 DIAGNOSIS — I129 Hypertensive chronic kidney disease with stage 1 through stage 4 chronic kidney disease, or unspecified chronic kidney disease: Secondary | ICD-10-CM | POA: Diagnosis not present

## 2020-11-02 DIAGNOSIS — D1430 Benign neoplasm of unspecified bronchus and lung: Secondary | ICD-10-CM | POA: Diagnosis not present

## 2020-11-02 DIAGNOSIS — I5042 Chronic combined systolic (congestive) and diastolic (congestive) heart failure: Secondary | ICD-10-CM | POA: Diagnosis not present

## 2020-11-02 DIAGNOSIS — M79604 Pain in right leg: Secondary | ICD-10-CM | POA: Diagnosis not present

## 2020-11-02 DIAGNOSIS — D472 Monoclonal gammopathy: Secondary | ICD-10-CM | POA: Diagnosis not present

## 2020-11-02 DIAGNOSIS — N1832 Chronic kidney disease, stage 3b: Secondary | ICD-10-CM | POA: Diagnosis not present

## 2020-11-02 DIAGNOSIS — I482 Chronic atrial fibrillation, unspecified: Secondary | ICD-10-CM | POA: Diagnosis not present

## 2020-11-03 ENCOUNTER — Other Ambulatory Visit: Payer: Self-pay

## 2020-11-03 ENCOUNTER — Ambulatory Visit (HOSPITAL_COMMUNITY)
Admission: RE | Admit: 2020-11-03 | Discharge: 2020-11-03 | Disposition: A | Discharge status: Home / Self Care | Payer: Medicare HMO | Source: Ambulatory Visit | Attending: Cardiovascular Disease | Admitting: Cardiovascular Disease

## 2020-11-03 ENCOUNTER — Telehealth: Payer: Self-pay

## 2020-11-03 DIAGNOSIS — I5021 Acute systolic (congestive) heart failure: Secondary | ICD-10-CM | POA: Insufficient documentation

## 2020-11-03 LAB — ECHOCARDIOGRAM COMPLETE
Area-P 1/2: 5.66 cm2
S' Lateral: 2.4 cm

## 2020-11-03 NOTE — Telephone Encounter (Signed)
Contacted patient/spouse, relayed to results to them. Both verbalized understanding

## 2020-11-03 NOTE — Telephone Encounter (Signed)
-----   Message from Ethelda Chick, RN sent at 11/03/2020  2:11 PM EST ----- Will send to [facility] Triage

## 2020-11-03 NOTE — Progress Notes (Signed)
*  PRELIMINARY RESULTS* Echocardiogram 2D Echocardiogram has been performed.  Stacey Drain 11/03/2020, 12:25 PM

## 2020-11-28 DIAGNOSIS — I129 Hypertensive chronic kidney disease with stage 1 through stage 4 chronic kidney disease, or unspecified chronic kidney disease: Secondary | ICD-10-CM | POA: Diagnosis not present

## 2020-11-28 DIAGNOSIS — N1832 Chronic kidney disease, stage 3b: Secondary | ICD-10-CM | POA: Diagnosis not present

## 2020-11-28 DIAGNOSIS — E785 Hyperlipidemia, unspecified: Secondary | ICD-10-CM | POA: Diagnosis not present

## 2020-11-28 DIAGNOSIS — I5042 Chronic combined systolic (congestive) and diastolic (congestive) heart failure: Secondary | ICD-10-CM | POA: Diagnosis not present

## 2020-11-28 DIAGNOSIS — D472 Monoclonal gammopathy: Secondary | ICD-10-CM | POA: Diagnosis not present

## 2020-11-28 DIAGNOSIS — J449 Chronic obstructive pulmonary disease, unspecified: Secondary | ICD-10-CM | POA: Diagnosis not present

## 2020-11-28 DIAGNOSIS — I482 Chronic atrial fibrillation, unspecified: Secondary | ICD-10-CM | POA: Diagnosis not present

## 2020-11-28 DIAGNOSIS — M79604 Pain in right leg: Secondary | ICD-10-CM | POA: Diagnosis not present

## 2020-11-28 DIAGNOSIS — D1430 Benign neoplasm of unspecified bronchus and lung: Secondary | ICD-10-CM | POA: Diagnosis not present

## 2020-11-28 DIAGNOSIS — E875 Hyperkalemia: Secondary | ICD-10-CM | POA: Diagnosis not present

## 2020-12-14 ENCOUNTER — Inpatient Hospital Stay (HOSPITAL_COMMUNITY): Payer: Medicare HMO | Attending: Hematology

## 2020-12-14 ENCOUNTER — Other Ambulatory Visit: Payer: Self-pay

## 2020-12-14 ENCOUNTER — Telehealth: Payer: Self-pay

## 2020-12-14 DIAGNOSIS — I130 Hypertensive heart and chronic kidney disease with heart failure and stage 1 through stage 4 chronic kidney disease, or unspecified chronic kidney disease: Secondary | ICD-10-CM | POA: Insufficient documentation

## 2020-12-14 DIAGNOSIS — N189 Chronic kidney disease, unspecified: Secondary | ICD-10-CM | POA: Insufficient documentation

## 2020-12-14 DIAGNOSIS — D472 Monoclonal gammopathy: Secondary | ICD-10-CM

## 2020-12-14 DIAGNOSIS — Z87891 Personal history of nicotine dependence: Secondary | ICD-10-CM | POA: Insufficient documentation

## 2020-12-14 DIAGNOSIS — E875 Hyperkalemia: Secondary | ICD-10-CM | POA: Insufficient documentation

## 2020-12-14 DIAGNOSIS — R911 Solitary pulmonary nodule: Secondary | ICD-10-CM | POA: Diagnosis not present

## 2020-12-14 DIAGNOSIS — C9000 Multiple myeloma not having achieved remission: Secondary | ICD-10-CM | POA: Diagnosis not present

## 2020-12-14 LAB — CBC WITH DIFFERENTIAL/PLATELET
Abs Immature Granulocytes: 0.1 10*3/uL — ABNORMAL HIGH (ref 0.00–0.07)
Basophils Absolute: 0 10*3/uL (ref 0.0–0.1)
Basophils Relative: 0 %
Eosinophils Absolute: 0.1 10*3/uL (ref 0.0–0.5)
Eosinophils Relative: 1 %
HCT: 43.8 % (ref 39.0–52.0)
Hemoglobin: 14.3 g/dL (ref 13.0–17.0)
Immature Granulocytes: 1 %
Lymphocytes Relative: 10 %
Lymphs Abs: 1.2 10*3/uL (ref 0.7–4.0)
MCH: 32.8 pg (ref 26.0–34.0)
MCHC: 32.6 g/dL (ref 30.0–36.0)
MCV: 100.5 fL — ABNORMAL HIGH (ref 80.0–100.0)
Monocytes Absolute: 1.2 10*3/uL — ABNORMAL HIGH (ref 0.1–1.0)
Monocytes Relative: 9 %
Neutro Abs: 10 10*3/uL — ABNORMAL HIGH (ref 1.7–7.7)
Neutrophils Relative %: 79 %
Platelets: 211 10*3/uL (ref 150–400)
RBC: 4.36 MIL/uL (ref 4.22–5.81)
RDW: 13.2 % (ref 11.5–15.5)
WBC: 12.6 10*3/uL — ABNORMAL HIGH (ref 4.0–10.5)
nRBC: 0 % (ref 0.0–0.2)

## 2020-12-14 LAB — COMPREHENSIVE METABOLIC PANEL
ALT: 14 U/L (ref 0–44)
AST: 16 U/L (ref 15–41)
Albumin: 3.6 g/dL (ref 3.5–5.0)
Alkaline Phosphatase: 92 U/L (ref 38–126)
Anion gap: 7 (ref 5–15)
BUN: 26 mg/dL — ABNORMAL HIGH (ref 8–23)
CO2: 27 mmol/L (ref 22–32)
Calcium: 9 mg/dL (ref 8.9–10.3)
Chloride: 102 mmol/L (ref 98–111)
Creatinine, Ser: 1.47 mg/dL — ABNORMAL HIGH (ref 0.61–1.24)
GFR, Estimated: 49 mL/min — ABNORMAL LOW (ref >60)
Glucose, Bld: 117 mg/dL — ABNORMAL HIGH (ref 70–99)
Potassium: 3.9 mmol/L (ref 3.5–5.1)
Sodium: 136 mmol/L (ref 135–145)
Total Bilirubin: 1.5 mg/dL — ABNORMAL HIGH (ref 0.3–1.2)
Total Protein: 6.4 g/dL — ABNORMAL LOW (ref 6.5–8.1)

## 2020-12-14 NOTE — Telephone Encounter (Signed)
Merlin alert received for increased VPacing %. Was 31% 08/2020 and now 56%. Device was implanted for intermittent CHB. Could we adjust any alerts for VPacing? Routing to Dr. Lovena Le for review.

## 2020-12-15 LAB — KAPPA/LAMBDA LIGHT CHAINS
Kappa free light chain: 39.3 mg/L — ABNORMAL HIGH (ref 3.3–19.4)
Kappa, lambda light chain ratio: 2.11 — ABNORMAL HIGH (ref 0.26–1.65)
Lambda free light chains: 18.6 mg/L (ref 5.7–26.3)

## 2020-12-15 NOTE — Telephone Encounter (Signed)
No change in programming.

## 2020-12-16 LAB — PROTEIN ELECTROPHORESIS, SERUM
A/G Ratio: 1.2 (ref 0.7–1.7)
Albumin ELP: 3.3 g/dL (ref 2.9–4.4)
Alpha-1-Globulin: 0.2 g/dL (ref 0.0–0.4)
Alpha-2-Globulin: 0.7 g/dL (ref 0.4–1.0)
Beta Globulin: 0.8 g/dL (ref 0.7–1.3)
Gamma Globulin: 1 g/dL (ref 0.4–1.8)
Globulin, Total: 2.8 g/dL (ref 2.2–3.9)
M-Spike, %: 0.6 g/dL — ABNORMAL HIGH
Total Protein ELP: 6.1 g/dL (ref 6.0–8.5)

## 2020-12-17 NOTE — Progress Notes (Signed)
Ricardo Garrett, [place] [id number]   CLINIC:  Medical Oncology/Hematology  PCP:  [name], MD [address] [place] [address]  [phone]  REASON FOR VISIT:  Follow-up for MGUS and right lung nodule  PRIOR THERAPY: None  CURRENT THERAPY: Surveillance  INTERVAL HISTORY:  [name], a 75 y.o. male, returns for routine follow-up for his MGUS and right lung nodule. Ricardo Garrett was last contacted via telephone on 05/07/2020.  Ricardo Garrett does not report any infections.  No ER visits or hospitalizations reported.  No new onset bone pains.  Appetite is 100%.  Energy levels are 90%.    REVIEW OF SYSTEMS:  Review of Systems  All other systems reviewed and are negative.   PAST MEDICAL/SURGICAL HISTORY:  Past Medical History:  Diagnosis Date  . Arthritis    "finger on right hand" (11/23/2017)  . Broken arm ~ 1952   "no OR; just reset it; not sure which side"  . CAD (coronary artery disease)   . CHF (congestive heart failure) ([place])    a. 12/2015: echo showing reduced EF of 35-40% b. NST: 09/2017: scar with peri-infarct ischemia, intermediate-risk study  . COPD (chronic obstructive pulmonary disease) ([place])    "was on RX when Ricardo Garrett had CHF; not on anything now" (11/23/2017)  . High cholesterol   . HOH (hard of hearing)   . Hypertension   . Persistent atrial fibrillation ()    a. s/p DCCV in 03/2016   Past Surgical History:  Procedure Laterality Date  . CARDIAC CATHETERIZATION  11/23/2017  . CARDIOVERSION N/A 04/05/2016   Procedure: CARDIOVERSION;  Surgeon: Josue Hector, MD;  [facility];  Service: Cardiovascular;  Laterality: N/A;  . COMPRESSION HIP SCREW Right 03/15/2013   Procedure: COMPRESSION HIP;  Surgeon: Sanjuana Kava, MD;  Location: [facility] ORS;  Service: Orthopedics;  Laterality: Right;  . CORONARY ATHERECTOMY N/A 11/24/2017   Procedure: CORONARY ATHERECTOMY;  Surgeon: Burnell Blanks, MD;  Location: [place]  CV LAB;  Service: Cardiovascular;  Laterality: N/A;  . CORONARY BALLOON ANGIOPLASTY N/A 11/24/2017   Procedure: CORONARY BALLOON ANGIOPLASTY;  Surgeon: Burnell Blanks, MD;  Location: [place] CV LAB;  Service: Cardiovascular;  Laterality: N/A;  . CORONARY STENT INTERVENTION N/A 11/24/2017   Procedure: CORONARY STENT INTERVENTION;  Surgeon: Burnell Blanks, MD;  Location: [place] CV LAB;  Service: Cardiovascular;  Laterality: N/A;  . FRACTURE SURGERY    . PACEMAKER IMPLANT N/A 11/27/2017   Procedure: PACEMAKER IMPLANT;  Surgeon: Evans Lance, MD;  Location: [place] CV LAB;  Service: Cardiovascular;  Laterality: N/A;  . RIGHT/LEFT HEART CATH AND CORONARY ANGIOGRAPHY N/A 11/23/2017   Procedure: RIGHT/LEFT HEART CATH AND CORONARY ANGIOGRAPHY;  Surgeon: Burnell Blanks, MD;  Location: [place] CV LAB;  Service: Cardiovascular;  Laterality: N/A;  . SKIN GRAFT Left    from right thigh  . TONSILLECTOMY      SOCIAL HISTORY:  Social History   Socioeconomic History  . Marital status: Married    Spouse name: Not on file  . Number of children: Not on file  . Years of education: Not on file  . Highest education level: Not on file  Occupational History  . Not on file  Tobacco Use  . Smoking status: Former Smoker    Packs/day: 1.00    Years: 20.00    Pack years: 20.00    Types: Cigarettes    Quit  date: 11/01/1979    Years since quitting: 41.1  . Smokeless tobacco: Never Used  Vaping Use  . Vaping Use: Never used  Substance and Sexual Activity  . Alcohol use: Yes    Comment: 11/23/2017 "beer once/month maybe"  . Drug use: Yes    Types: Marijuana    Comment: 11/23/2017 "nothing for years"  . Sexual activity: Yes    Birth control/protection: None  Other Topics Concern  . Not on file  Social History Narrative  . Not on file   Social Determinants of Health   Financial Resource Strain: Low Risk   . Difficulty of Paying Living Expenses: Not very hard   Food Insecurity: No Food Insecurity  . Worried About Charity fundraiser in the Last Year: Never true  . Ran Out of Food in the Last Year: Never true  Transportation Needs: No Transportation Needs  . Lack of Transportation (Medical): No  . Lack of Transportation (Non-Medical): No  Physical Activity: Inactive  . Days of Exercise per Week: 0 days  . Minutes of Exercise per Session: 0 min  Stress: No Stress Concern Present  . Feeling of Stress : Not at all  Social Connections: Moderately Isolated  . Frequency of Communication with Friends and Family: Twice a week  . Frequency of Social Gatherings with Friends and Family: Twice a week  . Attends Religious Services: Never  . Active Member of Clubs or Organizations: No  . Attends Archivist Meetings: Never  . Marital Status: Married  Human resources officer Violence: Unknown  . Fear of Current or Ex-Partner: Patient refused  . Emotionally Abused: Patient refused  . Physically Abused: Patient refused  . Sexually Abused: Patient refused    FAMILY HISTORY:  Family History  Problem Relation Age of Onset  . Diabetes Mother   . Heart attack Father     CURRENT MEDICATIONS:  Current Outpatient Medications  Medication Sig Dispense Refill  . apixaban (ELIQUIS) 2.5 MG TABS tablet Take 1 tablet (2.5 mg total) by mouth 2 (two) times daily. 60 tablet 5  . atorvastatin (LIPITOR) 80 MG tablet Take 1 tablet (80 mg total) by mouth daily. 90 tablet 3  . clopidogrel (PLAVIX) 75 MG tablet Take 1 tablet by mouth once daily 90 tablet 3  . digoxin (LANOXIN) 0.125 MG tablet Take 1 tablet (125 mcg total) by mouth daily. 90 tablet 3  . furosemide (LASIX) 40 MG tablet Take 1 tablet (40 mg total) by mouth daily. 30 tablet 6  . HYDROcodone-acetaminophen (NORCO/VICODIN) 5-325 MG tablet One tablet by mouth every six hours as needed for pain. 56 tablet 0  . lisinopril (ZESTRIL) 40 MG tablet Take 40 mg by mouth daily.    Marland Kitchen LOKELMA 5 g packet Take 1 packet by  mouth daily.    Marland Kitchen losartan (COZAAR) 50 MG tablet Take 1 tablet by mouth daily.    . metoprolol succinate (TOPROL-XL) 25 MG 24 hr tablet TAKE 1/2 (ONE-HALF) TABLET BY MOUTH IN THE MORNING AND 1 TABLET IN THE EVENING 135 tablet 3  . potassium chloride (KLOR-CON) 10 MEQ tablet Take 10 mEq by mouth daily.    . tamsulosin (FLOMAX) 0.4 MG CAPS capsule Take 0.4 mg by mouth daily.     No current facility-administered medications for this visit.    ALLERGIES:  No Known Allergies  PHYSICAL EXAM:  Performance status (ECOG): 1 - Symptomatic but completely ambulatory  There were no vitals filed for this visit. Wt Readings from Last 3 Encounters:  10/28/20 137 lb (62.1 kg)  10/19/20 142 lb 3.2 oz (64.5 kg)  04/15/20 130 lb 6.4 oz (59.1 kg)   Physical Exam Vitals reviewed.  Constitutional:      Appearance: Normal appearance.  Cardiovascular:     Rate and Rhythm: Normal rate and regular rhythm.     Heart sounds: Normal heart sounds.  Pulmonary:     Effort: Pulmonary effort is normal.     Breath sounds: Normal breath sounds.  Abdominal:     Palpations: Abdomen is soft.  Skin:    General: Skin is warm.  Neurological:     General: No focal deficit present.     Mental Status: Ricardo Garrett is alert and oriented to person, place, and time.  Psychiatric:        Mood and Affect: Mood normal.        Behavior: Behavior normal.     LABORATORY DATA:  I have reviewed the labs as listed.  CBC Latest Ref Rng & Units 12/14/2020 05/05/2020 11/26/2017  WBC 4.0 - 10.5 K/uL 12.6(H) 7.9 8.2  Hemoglobin 13.0 - 17.0 g/dL 14.3 14.0 15.1  Hematocrit 39.0 - 52.0 % 43.8 43.8 43.9  Platelets 150 - 400 K/uL 211 314 199   CMP Latest Ref Rng & Units 12/14/2020 05/05/2020 01/14/2020  Glucose 70 - 99 mg/dL 117(H) 118(H) 115(H)  BUN 8 - 23 mg/dL 26(H) 48(H) 41(H)  Creatinine 0.61 - 1.24 mg/dL 1.47(H) 2.41(H) 1.88(H)  Sodium 135 - 145 mmol/L 136 135 136  Potassium 3.5 - 5.1 mmol/L 3.9 6.0(H) 4.5  Chloride 98 - 111 mmol/L 102  106 102  CO2 22 - 32 mmol/L 27 20(L) 24  Calcium 8.9 - 10.3 mg/dL 9.0 8.9 8.9  Total Protein 6.5 - 8.1 g/dL 6.4(L) 6.6 -  Total Bilirubin 0.3 - 1.2 mg/dL 1.5(H) 1.1 -  Alkaline Phos 38 - 126 U/L 92 80 -  AST 15 - 41 U/L 16 22 -  ALT 0 - 44 U/L 14 19 -      Component Value Date/Time   RBC 4.36 12/14/2020 0947   MCV 100.5 (H) 12/14/2020 0947   MCV 89.2 02/12/2016 1100   MCH 32.8 12/14/2020 0947   MCHC 32.6 12/14/2020 0947   RDW 13.2 12/14/2020 0947   RDW 15.1 02/12/2016 1100   LYMPHSABS 1.2 12/14/2020 0947   MONOABS 1.2 (H) 12/14/2020 0947   EOSABS 0.1 12/14/2020 0947   BASOSABS 0.0 12/14/2020 0947   Lab Results  Component Value Date   TOTALPROTELP 6.1 12/14/2020   ALBUMINELP 3.3 12/14/2020   A1GS 0.2 12/14/2020   A2GS 0.7 12/14/2020   BETS 0.8 12/14/2020   GAMS 1.0 12/14/2020   MSPIKE 0.6 (H) 12/14/2020   SPEI Comment 12/14/2020    Lab Results  Component Value Date   KPAFRELGTCHN 39.3 (H) 12/14/2020   LAMBDASER 18.6 12/14/2020   KAPLAMBRATIO 2.11 (H) 12/14/2020    DIAGNOSTIC IMAGING:  I have independently reviewed the scans and discussed with the patient. No results found.   ASSESSMENT:  1.  IgG kappa MGUS: -Labs from 05/05/2020 was reviewed.  SPEP showed 0.4 g/dL which is stable. -Free light chain ratio increased to 3.94.  Kappa light chains increased to 61.  Creatinine has also increased to 2.41 from 1.8 in March. -The elevated free light chain ratio is probably due to worsening kidney function.  Ricardo Garrett is on medications which could affect his creatinine.   2.  Right lower lobe lung nodule: -I reviewed results of CT chest without contrast  on 05/05/2020 which showed right lower lobe 8 mm solid pulmonary nodule, obscured by atelectasis on 11/23/2015 chest CT.  No thoracic adenopathy.  Moderate centrilobular emphysema with mild diffuse bronchial wall thickening suggesting COPD.  Nonspecific patchy subpleural reticulation at the lung bases.  Interstitial lung disease  including early UIP is not excluded. -Ricardo Garrett quit smoking 30 years ago.  Smoked 1 pack/day for 20 to 30 years.  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   PLAN:  1.  IgG kappa MGUS: -We reviewed myeloma labs from 12/14/2020.  M spike is 0.6 g.  Kappa light chains are 39.3 with ratio of 2.11. -Ricardo Garrett does not have any "crab" features.  Continue follow-ups at this time. -RTC 6 months with repeat labs.  We will also repeat skeletal survey.  2.  Right lower lobe lung nodule: -Previous CT scan showed right lower lobe lung nodule. -I have recommended a follow-up CT scan of the chest in 6 months.  3.  Hyperkalemia: -Potassium today is 3.9.  Continue potassium 10 mEq daily.  4.  CKD: -Creatinine improved to 1.47.  Continue follow-up with Dr. Theador Hawthorne.  Orders placed this encounter:  No orders of the defined types were placed in this encounter.    Derek Jack, MD [place] [phone]   I, Milinda Antis, am acting as a scribe for Dr. Sanda Linger.  I, Derek Jack MD, have reviewed the above documentation for accuracy and completeness, and I agree with the above.

## 2020-12-21 ENCOUNTER — Other Ambulatory Visit: Payer: Self-pay

## 2020-12-21 ENCOUNTER — Inpatient Hospital Stay (HOSPITAL_BASED_OUTPATIENT_CLINIC_OR_DEPARTMENT_OTHER): Payer: Medicare HMO | Admitting: Hematology

## 2020-12-21 VITALS — BP 154/74 | HR 64 | Temp 97.2°F | Resp 18 | Wt 141.7 lb

## 2020-12-21 DIAGNOSIS — R911 Solitary pulmonary nodule: Secondary | ICD-10-CM | POA: Diagnosis not present

## 2020-12-21 DIAGNOSIS — E875 Hyperkalemia: Secondary | ICD-10-CM | POA: Diagnosis not present

## 2020-12-21 DIAGNOSIS — I130 Hypertensive heart and chronic kidney disease with heart failure and stage 1 through stage 4 chronic kidney disease, or unspecified chronic kidney disease: Secondary | ICD-10-CM | POA: Diagnosis not present

## 2020-12-21 DIAGNOSIS — C9000 Multiple myeloma not having achieved remission: Secondary | ICD-10-CM | POA: Diagnosis not present

## 2020-12-21 DIAGNOSIS — D472 Monoclonal gammopathy: Secondary | ICD-10-CM

## 2020-12-21 DIAGNOSIS — Z87891 Personal history of nicotine dependence: Secondary | ICD-10-CM | POA: Diagnosis not present

## 2020-12-21 DIAGNOSIS — N189 Chronic kidney disease, unspecified: Secondary | ICD-10-CM | POA: Diagnosis not present

## 2020-12-21 NOTE — Patient Instructions (Signed)
[facility] at [facility] Discharge Instructions  You were seen and examined today by Dr. Katragadda. Please follow up as scheduled.    Thank you for choosing [facility] at [facility] to provide your oncology and hematology care.  To afford each patient quality time with our provider, please arrive at least 15 minutes before your scheduled appointment time.   If you have a lab appointment with the Cancer Center please come in thru the Main Entrance and check in at the main information desk.  You need to re-schedule your appointment should you arrive 10 or more minutes late.  We strive to give you quality time with our providers, and arriving late affects you and other patients whose appointments are after yours.  Also, if you no show three or more times for appointments you may be dismissed from the clinic at the providers discretion.     Again, thank you for choosing [facility].  Our hope is that these requests will decrease the amount of time that you wait before being seen by our physicians.       _____________________________________________________________  Should you have questions after your visit to [facility], please contact our office at [phone] and follow the prompts.  Our office hours are 8:00 a.m. and 4:30 p.m. Monday - Friday.  Please note that voicemails left after 4:00 p.m. may not be returned until the following business day.  We are closed weekends and major holidays.  You do have access to a nurse 24-7, just call the main number to the clinic [phone] and do not press any options, hold on the line and a nurse will answer the phone.    For prescription refill requests, have your pharmacy contact our office and allow 72 hours.    Due to Covid, you will need to wear a mask upon entering the hospital. If you do not have a mask, a mask will be given to you at the Main Entrance upon arrival. For  doctor visits, patients may have 1 support person age 18 or older with them. For treatment visits, patients can not have anyone with them due to social distancing guidelines and our immunocompromised population.      

## 2020-12-23 ENCOUNTER — Ambulatory Visit (INDEPENDENT_AMBULATORY_CARE_PROVIDER_SITE_OTHER): Payer: Medicare HMO

## 2020-12-23 DIAGNOSIS — I495 Sick sinus syndrome: Secondary | ICD-10-CM | POA: Diagnosis not present

## 2020-12-23 LAB — CUP PACEART REMOTE DEVICE CHECK
Battery Remaining Longevity: 125 mo
Battery Remaining Percentage: 95.5 %
Battery Voltage: 3.01 V
Brady Statistic RV Percent Paced: 54 %
Date Time Interrogation Session: 20220223040013
Implantable Lead Implant Date: 20190128
Implantable Lead Implant Date: 20190128
Implantable Lead Location: 753859
Implantable Lead Location: 753860
Implantable Pulse Generator Implant Date: 20190128
Lead Channel Impedance Value: 490 Ohm
Lead Channel Pacing Threshold Amplitude: 0.75 V
Lead Channel Pacing Threshold Pulse Width: 0.5 ms
Lead Channel Sensing Intrinsic Amplitude: 10.4 mV
Lead Channel Setting Pacing Amplitude: 2.5 V
Lead Channel Setting Pacing Pulse Width: 0.5 ms
Lead Channel Setting Sensing Sensitivity: 2 mV
Pulse Gen Model: 2272
Pulse Gen Serial Number: 8982069

## 2020-12-28 DIAGNOSIS — E785 Hyperlipidemia, unspecified: Secondary | ICD-10-CM | POA: Diagnosis not present

## 2020-12-28 DIAGNOSIS — I482 Chronic atrial fibrillation, unspecified: Secondary | ICD-10-CM | POA: Diagnosis not present

## 2020-12-28 DIAGNOSIS — N2581 Secondary hyperparathyroidism of renal origin: Secondary | ICD-10-CM | POA: Diagnosis not present

## 2020-12-28 DIAGNOSIS — I5042 Chronic combined systolic (congestive) and diastolic (congestive) heart failure: Secondary | ICD-10-CM | POA: Diagnosis not present

## 2020-12-28 DIAGNOSIS — M79604 Pain in right leg: Secondary | ICD-10-CM | POA: Diagnosis not present

## 2020-12-28 DIAGNOSIS — N1832 Chronic kidney disease, stage 3b: Secondary | ICD-10-CM | POA: Diagnosis not present

## 2020-12-28 DIAGNOSIS — D1430 Benign neoplasm of unspecified bronchus and lung: Secondary | ICD-10-CM | POA: Diagnosis not present

## 2020-12-28 DIAGNOSIS — E875 Hyperkalemia: Secondary | ICD-10-CM | POA: Diagnosis not present

## 2020-12-28 DIAGNOSIS — D472 Monoclonal gammopathy: Secondary | ICD-10-CM | POA: Diagnosis not present

## 2020-12-28 DIAGNOSIS — J449 Chronic obstructive pulmonary disease, unspecified: Secondary | ICD-10-CM | POA: Diagnosis not present

## 2020-12-30 ENCOUNTER — Other Ambulatory Visit: Payer: Self-pay

## 2020-12-30 ENCOUNTER — Other Ambulatory Visit (HOSPITAL_COMMUNITY)
Admission: RE | Admit: 2020-12-30 | Discharge: 2020-12-30 | Disposition: A | Discharge status: Home / Self Care | Payer: Medicare HMO | Source: Ambulatory Visit | Attending: Nephrology | Admitting: Nephrology

## 2020-12-30 DIAGNOSIS — I5032 Chronic diastolic (congestive) heart failure: Secondary | ICD-10-CM | POA: Diagnosis not present

## 2020-12-30 DIAGNOSIS — E211 Secondary hyperparathyroidism, not elsewhere classified: Secondary | ICD-10-CM | POA: Diagnosis not present

## 2020-12-30 DIAGNOSIS — I129 Hypertensive chronic kidney disease with stage 1 through stage 4 chronic kidney disease, or unspecified chronic kidney disease: Secondary | ICD-10-CM | POA: Insufficient documentation

## 2020-12-30 DIAGNOSIS — E559 Vitamin D deficiency, unspecified: Secondary | ICD-10-CM | POA: Diagnosis not present

## 2020-12-30 DIAGNOSIS — R809 Proteinuria, unspecified: Secondary | ICD-10-CM | POA: Insufficient documentation

## 2020-12-30 DIAGNOSIS — N1832 Chronic kidney disease, stage 3b: Secondary | ICD-10-CM | POA: Insufficient documentation

## 2020-12-30 LAB — RENAL FUNCTION PANEL
Albumin: 3.1 g/dL — ABNORMAL LOW (ref 3.5–5.0)
Anion gap: 7 (ref 5–15)
BUN: 41 mg/dL — ABNORMAL HIGH (ref 8–23)
CO2: 25 mmol/L (ref 22–32)
Calcium: 8.2 mg/dL — ABNORMAL LOW (ref 8.9–10.3)
Chloride: 103 mmol/L (ref 98–111)
Creatinine, Ser: 1.85 mg/dL — ABNORMAL HIGH (ref 0.61–1.24)
GFR, Estimated: 37 mL/min — ABNORMAL LOW (ref >60)
Glucose, Bld: 109 mg/dL — ABNORMAL HIGH (ref 70–99)
Phosphorus: 4.4 mg/dL (ref 2.5–4.6)
Potassium: 4.5 mmol/L (ref 3.5–5.1)
Sodium: 135 mmol/L (ref 135–145)

## 2020-12-30 LAB — PROTEIN / CREATININE RATIO, URINE
Creatinine, Urine: 164.27 mg/dL
Protein Creatinine Ratio: 1.21 mg/mg{Cre} — ABNORMAL HIGH (ref 0.00–0.15)
Total Protein, Urine: 198 mg/dL

## 2020-12-30 LAB — CBC
HCT: 43.7 % (ref 39.0–52.0)
Hemoglobin: 14.1 g/dL (ref 13.0–17.0)
MCH: 32.8 pg (ref 26.0–34.0)
MCHC: 32.3 g/dL (ref 30.0–36.0)
MCV: 101.6 fL — ABNORMAL HIGH (ref 80.0–100.0)
Platelets: 279 10*3/uL (ref 150–400)
RBC: 4.3 MIL/uL (ref 4.22–5.81)
RDW: 13.3 % (ref 11.5–15.5)
WBC: 7.3 10*3/uL (ref 4.0–10.5)
nRBC: 0 % (ref 0.0–0.2)

## 2020-12-30 NOTE — Progress Notes (Signed)
Remote pacemaker transmission.   

## 2021-01-27 DIAGNOSIS — J449 Chronic obstructive pulmonary disease, unspecified: Secondary | ICD-10-CM | POA: Diagnosis not present

## 2021-01-27 DIAGNOSIS — I5042 Chronic combined systolic (congestive) and diastolic (congestive) heart failure: Secondary | ICD-10-CM | POA: Diagnosis not present

## 2021-01-27 DIAGNOSIS — E875 Hyperkalemia: Secondary | ICD-10-CM | POA: Diagnosis not present

## 2021-01-27 DIAGNOSIS — N2581 Secondary hyperparathyroidism of renal origin: Secondary | ICD-10-CM | POA: Diagnosis not present

## 2021-01-27 DIAGNOSIS — R7303 Prediabetes: Secondary | ICD-10-CM | POA: Diagnosis not present

## 2021-01-27 DIAGNOSIS — I482 Chronic atrial fibrillation, unspecified: Secondary | ICD-10-CM | POA: Diagnosis not present

## 2021-01-27 DIAGNOSIS — D1430 Benign neoplasm of unspecified bronchus and lung: Secondary | ICD-10-CM | POA: Diagnosis not present

## 2021-01-27 DIAGNOSIS — D472 Monoclonal gammopathy: Secondary | ICD-10-CM | POA: Diagnosis not present

## 2021-01-27 DIAGNOSIS — E785 Hyperlipidemia, unspecified: Secondary | ICD-10-CM | POA: Diagnosis not present

## 2021-01-27 DIAGNOSIS — I129 Hypertensive chronic kidney disease with stage 1 through stage 4 chronic kidney disease, or unspecified chronic kidney disease: Secondary | ICD-10-CM | POA: Diagnosis not present

## 2021-03-05 DIAGNOSIS — E785 Hyperlipidemia, unspecified: Secondary | ICD-10-CM | POA: Diagnosis not present

## 2021-03-05 DIAGNOSIS — I10 Essential (primary) hypertension: Secondary | ICD-10-CM | POA: Diagnosis not present

## 2021-03-05 DIAGNOSIS — R7303 Prediabetes: Secondary | ICD-10-CM | POA: Diagnosis not present

## 2021-03-10 DIAGNOSIS — I129 Hypertensive chronic kidney disease with stage 1 through stage 4 chronic kidney disease, or unspecified chronic kidney disease: Secondary | ICD-10-CM | POA: Diagnosis not present

## 2021-03-10 DIAGNOSIS — I482 Chronic atrial fibrillation, unspecified: Secondary | ICD-10-CM | POA: Diagnosis not present

## 2021-03-10 DIAGNOSIS — J449 Chronic obstructive pulmonary disease, unspecified: Secondary | ICD-10-CM | POA: Diagnosis not present

## 2021-03-10 DIAGNOSIS — D1430 Benign neoplasm of unspecified bronchus and lung: Secondary | ICD-10-CM | POA: Diagnosis not present

## 2021-03-10 DIAGNOSIS — M79604 Pain in right leg: Secondary | ICD-10-CM | POA: Diagnosis not present

## 2021-03-10 DIAGNOSIS — N1832 Chronic kidney disease, stage 3b: Secondary | ICD-10-CM | POA: Diagnosis not present

## 2021-03-10 DIAGNOSIS — D472 Monoclonal gammopathy: Secondary | ICD-10-CM | POA: Diagnosis not present

## 2021-03-10 DIAGNOSIS — E208 Other hypoparathyroidism: Secondary | ICD-10-CM | POA: Diagnosis not present

## 2021-03-10 DIAGNOSIS — I5042 Chronic combined systolic (congestive) and diastolic (congestive) heart failure: Secondary | ICD-10-CM | POA: Insufficient documentation

## 2021-03-10 DIAGNOSIS — E782 Mixed hyperlipidemia: Secondary | ICD-10-CM | POA: Diagnosis not present

## 2021-03-15 ENCOUNTER — Telehealth: Payer: Self-pay

## 2021-03-15 NOTE — Telephone Encounter (Signed)
-----   Message from Josue Hector, MD sent at 03/15/2021  9:19 AM EDT ----- Needs to f/u with oncology and primary K too high and azotemic Med list shows ACE/ARB should not Be on both stop K supplement  He has had Lokelma before   Chole at goal

## 2021-03-15 NOTE — Telephone Encounter (Signed)
I spoke with wife,Stephanie. She states Mr.Salce is not on potassium. I will forward lab results to Dr.Hall. Wife states they will follow up with oncology.

## 2021-03-17 DIAGNOSIS — E785 Hyperlipidemia, unspecified: Secondary | ICD-10-CM | POA: Diagnosis not present

## 2021-03-17 DIAGNOSIS — I10 Essential (primary) hypertension: Secondary | ICD-10-CM | POA: Diagnosis not present

## 2021-03-24 ENCOUNTER — Ambulatory Visit (INDEPENDENT_AMBULATORY_CARE_PROVIDER_SITE_OTHER): Payer: Medicare HMO

## 2021-03-24 DIAGNOSIS — I495 Sick sinus syndrome: Secondary | ICD-10-CM | POA: Diagnosis not present

## 2021-03-25 LAB — CUP PACEART REMOTE DEVICE CHECK
Battery Remaining Longevity: 126 mo
Battery Remaining Percentage: 95.5 %
Battery Voltage: 3.01 V
Brady Statistic RV Percent Paced: 51 %
Date Time Interrogation Session: 20220525040022
Implantable Lead Implant Date: 20190128
Implantable Lead Implant Date: 20190128
Implantable Lead Location: 753859
Implantable Lead Location: 753860
Implantable Pulse Generator Implant Date: 20190128
Lead Channel Impedance Value: 490 Ohm
Lead Channel Pacing Threshold Amplitude: 0.75 V
Lead Channel Pacing Threshold Pulse Width: 0.5 ms
Lead Channel Sensing Intrinsic Amplitude: 9.6 mV
Lead Channel Setting Pacing Amplitude: 2.5 V
Lead Channel Setting Pacing Pulse Width: 0.5 ms
Lead Channel Setting Sensing Sensitivity: 2 mV
Pulse Gen Model: 2272
Pulse Gen Serial Number: 8982069

## 2021-04-15 NOTE — Progress Notes (Signed)
Remote pacemaker transmission.   

## 2021-04-19 NOTE — Progress Notes (Signed)
Date:  04/21/2021   ID:  Ricardo Garrett, DOB 09-14-1946, MRN [id number]  Provider Location: Office  PCP:  [name], MD  Cardiologist:  [name], MD   Electrophysiologist:  [name], MD   Evaluation Performed:  Follow-Up Visit  Chief Complaint:  Pacer/ Afib  History of Present Illness:    75 y.o. with ischemic DCM, HTN, HLD, PPM. Had abnormal stress test and set up for cath 11/23/17 with orbital atherectomy and stenting of mid circumflex with DES and PCI small OM branch done by Dr Veronda Prude. 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 St Jude dual chamber by Dr Lovena Le   TTE 11/03/20 EF improved 55% with mild MR PACEART reviewed 03/24/21 normal function   Still seems to be having ETOH/Marijuana from time to time but not much Compliant with meds Walks dogs in am   Lung cancer CT 05/05/20 with 8 mm RLL nodule   Has had Covid vaccine   Primary issue is left leg and hip pain   Not smoking anymore  Drinking at least 6 beers / day    Past Medical History:  Diagnosis Date   Arthritis    "finger on right hand" (11/23/2017)   Broken arm ~ 1952   "no OR; just reset it; not sure which side"   CAD (coronary artery disease)    CHF (congestive heart failure) ([place])    a. 12/2015: echo showing reduced EF of 35-40% b. NST: 09/2017: scar with peri-infarct ischemia, intermediate-risk study   COPD (chronic obstructive pulmonary disease) ([place])    "was on RX when he had CHF; not on anything now" (11/23/2017)   High cholesterol    HOH (hard of hearing)    Hypertension    Persistent atrial fibrillation ([place])    a. s/p DCCV in 03/2016   Past Surgical History:  Procedure Laterality Date   CARDIAC CATHETERIZATION  11/23/2017   CARDIOVERSION N/A 04/05/2016   Procedure: CARDIOVERSION;  Surgeon: Josue Hector, MD;  [facility];  Service: Cardiovascular;  Laterality: N/A;   COMPRESSION HIP SCREW Right 03/15/2013   Procedure: COMPRESSION HIP;  Surgeon: Sanjuana Kava,  MD;  Location: [facility];  Service: Orthopedics;  Laterality: Right;   CORONARY ATHERECTOMY N/A 11/24/2017   Procedure: CORONARY ATHERECTOMY;  Surgeon: Burnell Blanks, MD;  Location: [place] [facility];  Service: Cardiovascular;  Laterality: N/A;   CORONARY BALLOON ANGIOPLASTY N/A 11/24/2017   Procedure: CORONARY BALLOON ANGIOPLASTY;  Surgeon: Burnell Blanks, MD;  Location: [place] [facility];  Service: Cardiovascular;  Laterality: N/A;   CORONARY STENT INTERVENTION N/A 11/24/2017   Procedure: CORONARY STENT INTERVENTION;  Surgeon: Burnell Blanks, MD;  Location: [place] [facility];  Service: Cardiovascular;  Laterality: N/A;   FRACTURE SURGERY     PACEMAKER IMPLANT N/A 11/27/2017   Procedure: PACEMAKER IMPLANT;  Surgeon: Evans Lance, MD;  Location: [place] [facility];  Service: Cardiovascular;  Laterality: N/A;   RIGHT/LEFT HEART CATH AND CORONARY ANGIOGRAPHY N/A 11/23/2017   Procedure: RIGHT/LEFT HEART CATH AND CORONARY ANGIOGRAPHY;  Surgeon: Burnell Blanks, MD;  Location: [place] [facility];  Service: Cardiovascular;  Laterality: N/A;   SKIN GRAFT Left    from right thigh   TONSILLECTOMY       Current Meds  Medication Sig   apixaban (ELIQUIS) 2.5 MG TABS tablet Take 1 tablet (2.5 mg total) by mouth 2 (two) times daily.   atorvastatin (LIPITOR) 80 MG tablet Take 1 tablet (80  mg total) by mouth daily.   clopidogrel (PLAVIX) 75 MG tablet Take 1 tablet by mouth once daily   digoxin (LANOXIN) 0.125 MG tablet Take 1 tablet (125 mcg total) by mouth daily.   furosemide (LASIX) 40 MG tablet Take 1 tablet (40 mg total) by mouth daily.   HYDROcodone-acetaminophen (NORCO/VICODIN) 5-325 MG tablet One tablet by mouth every six hours as needed for pain.   lisinopril (ZESTRIL) 40 MG tablet Take 40 mg by mouth daily.   LOKELMA 5 g packet Take 1 packet by mouth daily.   losartan (COZAAR) 50 MG tablet Take 1 tablet by mouth daily.   metoprolol succinate (TOPROL-XL) 25 MG 24  hr tablet TAKE 1/2 (ONE-HALF) TABLET BY MOUTH IN THE MORNING AND 1 TABLET IN THE EVENING   tamsulosin (FLOMAX) 0.4 MG CAPS capsule Take 0.4 mg by mouth daily.     Allergies:   Patient has no known allergies.   Social History   Tobacco Use   Smoking status: Former    Packs/day: 1.00    Years: 20.00    Pack years: 20.00    Types: Cigarettes    Quit date: 11/01/1979    Years since quitting: 41.4   Smokeless tobacco: Never  Vaping Use   Vaping Use: Never used  Substance Use Topics   Alcohol use: Yes    Comment: 11/23/2017 "beer once/month maybe"   Drug use: Yes    Types: Marijuana    Comment: 11/23/2017 "nothing for years"     Family Hx: The patient's family history includes Diabetes in his mother; Heart attack in his father.  ROS:   Please see the history of present illness.     All other systems reviewed and are negative.   Prior CV studies:   The following studies were reviewed today:  PACEART 12/25/18 Echo 11/25/17 EF 50-55%  Labs/Other Tests and Data Reviewed:    EKG:   07/20/18  afib rate 68 intermittent V pacing   Recent Labs: 12/14/2020: ALT 14 12/30/2020: BUN 41; Creatinine, Ser 1.85; Hemoglobin 14.1; Platelets 279; Potassium 4.5; Sodium 135   Recent Lipid Panel Lab Results  Component Value Date/Time   CHOL 193 02/12/2016 12:00 AM   TRIG 196 02/12/2016 12:00 AM   HDL 29 02/12/2016 12:00 AM   CHOLHDL 6.7 02/12/2016 12:00 AM   LDLCALC 125 02/12/2016 12:00 AM    Wt Readings from Last 3 Encounters:  04/21/21 60.7 kg  12/21/20 64.3 kg  10/28/20 62.1 kg     Objective:    Vital Signs:  BP 112/68   Pulse 73   Ht 5\' 8"  (1.727 m)   Wt 60.7 kg   SpO2 97%   BMI 20.34 kg/m    Affect appropriate Chronically ill elderly white male  HEENT: very hard of hearing  Neck supple with no adenopathy JVP normal no bruits no thyromegaly Lungs rhonchi / exp wheezing  Heart:  S1/S2 no murmur, no rub, gallop or click PMI normal pacer under left clavicle  Abdomen:  benighn, BS positve, no tenderness, no AAA no bruit.  No HSM or HJR Distal pulses intact with no bruits No edema Neuro non-focal Skin warm and dry No muscular weakness   ASSESSMENT & PLAN:    1. Chronic Combined Systolic and Diastolic CHF: most recent EF k1/4/22 TTE  55%  improved continue medical Rx    2. Persistent Atrial Fibrillation  Failed [facility] 03/2016 continue eliquis . Rate control good    3. HTN:  Well controlled.  Continue current medications and low sodium Dash type diet.     4. HLD: on statin labs with primary LDL 62    5. Pacer:  Normal function by PaceArt f/u Dr Lovena Le    6. Ortho:  F/u Dr Luna Glasgow for x-rays and consider referral back to PT/OT   7. CAD: post stenting mid circumflex 11/23/17 On plavix and eliquis no ASA No angina stable   8. COPD:  History of smoking lung cancer screening CT 05/05/20 with 8 mm RLL nodule F/u CT at discretion of primary has quit   9. CRF:  Cr 1.96  f/u Dr Theador Hawthorne   10. Oncology:  IgG kappa MGUS negative bone survey myeloma panel per DR Delton Coombes light chain and M spike are stable No "crab" features   11.  ETOH:  counseled on moderation and risk of cirrhosis F/U primary for LFTls   COVID-19 Education: The signs and symptoms of COVID-19 were discussed with the patient and how to seek care for testing (follow up with PCP or arrange E-visit).  The importance of social distancing was discussed today.    Medication Adjustments/Labs and Tests Ordered: Current medicines are reviewed at length with the patient today.  Concerns regarding medicines are outlined above.   Tests Ordered:  None   Medication Changes: No orders of the defined types were placed in this encounter.   Disposition:  Follow up Lovena Le 6 months for PPM and me in a year   Signed, [name], MD  04/21/2021 11:09 AM    [place]

## 2021-04-21 ENCOUNTER — Other Ambulatory Visit: Payer: Self-pay

## 2021-04-21 ENCOUNTER — Ambulatory Visit (INDEPENDENT_AMBULATORY_CARE_PROVIDER_SITE_OTHER): Payer: Medicare HMO | Admitting: Cardiovascular Disease

## 2021-04-21 ENCOUNTER — Encounter: Payer: Self-pay | Admitting: Cardiovascular Disease

## 2021-04-21 VITALS — BP 112/68 | HR 73 | Ht 68.0 in | Wt 133.8 lb

## 2021-04-21 DIAGNOSIS — I2510 Atherosclerotic heart disease of native coronary artery without angina pectoris: Secondary | ICD-10-CM

## 2021-04-21 DIAGNOSIS — R69 Illness, unspecified: Secondary | ICD-10-CM | POA: Diagnosis not present

## 2021-04-21 DIAGNOSIS — Z950 Presence of cardiac pacemaker: Secondary | ICD-10-CM

## 2021-04-21 DIAGNOSIS — I4819 Other persistent atrial fibrillation: Secondary | ICD-10-CM

## 2021-04-21 DIAGNOSIS — F1010 Alcohol abuse, uncomplicated: Secondary | ICD-10-CM | POA: Diagnosis not present

## 2021-04-21 NOTE — Patient Instructions (Signed)
Medication Instructions:  Your physician recommends that you continue on your current medications as directed. Please refer to the Current Medication list given to you today.  *If you need a refill on your cardiac medications before your next appointment, please call your pharmacy*   Lab Work: NONE   If you have labs (blood work) drawn today and your tests are completely normal, you will receive your results only by: . MyChart Message (if you have MyChart) OR . A paper copy in the mail If you have any lab test that is abnormal or we need to change your treatment, we will call you to review the results.   Testing/Procedures: NONE    Follow-Up: At [facility], you and your health needs are our priority.  As part of our continuing mission to provide you with exceptional heart care, we have created designated Provider Care Teams.  These Care Teams include your primary Cardiologist (physician) and Advanced Practice Providers (APPs -  Physician Assistants and Nurse Practitioners) who all work together to provide you with the care you need, when you need it.  We recommend signing up for the patient portal called "MyChart".  Sign up information is provided on this After Visit Summary.  MyChart is used to connect with patients for Virtual Visits (Telemedicine).  Patients are able to view lab/test results, encounter notes, upcoming appointments, etc.  Non-urgent messages can be sent to your provider as well.   To learn more about what you can do with MyChart, go to [email or web address].    Your next appointment:   1 year(s)  The format for your next appointment:   In Person  Provider:   Peter Nishan, MD   Other Instructions Thank you for choosing [facility]!    

## 2021-04-29 DIAGNOSIS — I10 Essential (primary) hypertension: Secondary | ICD-10-CM | POA: Diagnosis not present

## 2021-04-29 DIAGNOSIS — E1165 Type 2 diabetes mellitus with hyperglycemia: Secondary | ICD-10-CM | POA: Diagnosis not present

## 2021-05-30 DIAGNOSIS — E1165 Type 2 diabetes mellitus with hyperglycemia: Secondary | ICD-10-CM | POA: Diagnosis not present

## 2021-05-30 DIAGNOSIS — I10 Essential (primary) hypertension: Secondary | ICD-10-CM | POA: Diagnosis not present

## 2021-06-08 DIAGNOSIS — L259 Unspecified contact dermatitis, unspecified cause: Secondary | ICD-10-CM | POA: Diagnosis not present

## 2021-06-08 DIAGNOSIS — L282 Other prurigo: Secondary | ICD-10-CM | POA: Diagnosis not present

## 2021-06-17 ENCOUNTER — Ambulatory Visit (HOSPITAL_COMMUNITY)
Admission: RE | Admit: 2021-06-17 | Discharge: 2021-06-17 | Disposition: A | Discharge status: Home / Self Care | Payer: Medicare HMO | Source: Ambulatory Visit | Attending: Hematology | Admitting: Hematology

## 2021-06-17 ENCOUNTER — Other Ambulatory Visit: Payer: Self-pay

## 2021-06-17 ENCOUNTER — Inpatient Hospital Stay (HOSPITAL_COMMUNITY): Payer: Medicare HMO | Attending: Hematology

## 2021-06-17 DIAGNOSIS — I700 Atherosclerosis of aorta: Secondary | ICD-10-CM | POA: Insufficient documentation

## 2021-06-17 DIAGNOSIS — D472 Monoclonal gammopathy: Secondary | ICD-10-CM

## 2021-06-17 DIAGNOSIS — R911 Solitary pulmonary nodule: Secondary | ICD-10-CM | POA: Diagnosis not present

## 2021-06-17 DIAGNOSIS — J439 Emphysema, unspecified: Secondary | ICD-10-CM | POA: Diagnosis not present

## 2021-06-17 DIAGNOSIS — R918 Other nonspecific abnormal finding of lung field: Secondary | ICD-10-CM | POA: Insufficient documentation

## 2021-06-17 LAB — COMPREHENSIVE METABOLIC PANEL
ALT: 17 U/L (ref 0–44)
AST: 19 U/L (ref 15–41)
Albumin: 3.5 g/dL (ref 3.5–5.0)
Alkaline Phosphatase: 80 U/L (ref 38–126)
Anion gap: 6 (ref 5–15)
BUN: 40 mg/dL — ABNORMAL HIGH (ref 8–23)
CO2: 25 mmol/L (ref 22–32)
Calcium: 8.6 mg/dL — ABNORMAL LOW (ref 8.9–10.3)
Chloride: 104 mmol/L (ref 98–111)
Creatinine, Ser: 1.72 mg/dL — ABNORMAL HIGH (ref 0.61–1.24)
GFR, Estimated: 41 mL/min — ABNORMAL LOW (ref >60)
Glucose, Bld: 125 mg/dL — ABNORMAL HIGH (ref 70–99)
Potassium: 5 mmol/L (ref 3.5–5.1)
Sodium: 135 mmol/L (ref 135–145)
Total Bilirubin: 1.3 mg/dL — ABNORMAL HIGH (ref 0.3–1.2)
Total Protein: 6.2 g/dL — ABNORMAL LOW (ref 6.5–8.1)

## 2021-06-17 LAB — CBC WITH DIFFERENTIAL/PLATELET
Abs Immature Granulocytes: 0.06 10*3/uL (ref 0.00–0.07)
Basophils Absolute: 0.1 10*3/uL (ref 0.0–0.1)
Basophils Relative: 1 %
Eosinophils Absolute: 0.2 10*3/uL (ref 0.0–0.5)
Eosinophils Relative: 2 %
HCT: 41.7 % (ref 39.0–52.0)
Hemoglobin: 13.9 g/dL (ref 13.0–17.0)
Immature Granulocytes: 1 %
Lymphocytes Relative: 18 %
Lymphs Abs: 1.3 10*3/uL (ref 0.7–4.0)
MCH: 34.2 pg — ABNORMAL HIGH (ref 26.0–34.0)
MCHC: 33.3 g/dL (ref 30.0–36.0)
MCV: 102.7 fL — ABNORMAL HIGH (ref 80.0–100.0)
Monocytes Absolute: 0.6 10*3/uL (ref 0.1–1.0)
Monocytes Relative: 9 %
Neutro Abs: 5 10*3/uL (ref 1.7–7.7)
Neutrophils Relative %: 69 %
Platelets: 251 10*3/uL (ref 150–400)
RBC: 4.06 MIL/uL — ABNORMAL LOW (ref 4.22–5.81)
RDW: 13.5 % (ref 11.5–15.5)
WBC: 7.3 10*3/uL (ref 4.0–10.5)
nRBC: 0 % (ref 0.0–0.2)

## 2021-06-18 LAB — KAPPA/LAMBDA LIGHT CHAINS
Kappa free light chain: 41.7 mg/L — ABNORMAL HIGH (ref 3.3–19.4)
Kappa, lambda light chain ratio: 3.9 — ABNORMAL HIGH (ref 0.26–1.65)
Lambda free light chains: 10.7 mg/L (ref 5.7–26.3)

## 2021-06-21 LAB — PROTEIN ELECTROPHORESIS, SERUM
A/G Ratio: 1.3 (ref 0.7–1.7)
Albumin ELP: 3.2 g/dL (ref 2.9–4.4)
Alpha-1-Globulin: 0.2 g/dL (ref 0.0–0.4)
Alpha-2-Globulin: 0.6 g/dL (ref 0.4–1.0)
Beta Globulin: 0.7 g/dL (ref 0.7–1.3)
Gamma Globulin: 1 g/dL (ref 0.4–1.8)
Globulin, Total: 2.5 g/dL (ref 2.2–3.9)
M-Spike, %: 0.5 g/dL — ABNORMAL HIGH
Total Protein ELP: 5.7 g/dL — ABNORMAL LOW (ref 6.0–8.5)

## 2021-06-22 ENCOUNTER — Inpatient Hospital Stay (HOSPITAL_COMMUNITY): Payer: Medicare HMO | Admitting: Hematology and Oncology

## 2021-06-22 ENCOUNTER — Other Ambulatory Visit: Payer: Self-pay

## 2021-06-22 VITALS — BP 135/92 | HR 60 | Temp 97.7°F | Resp 18 | Wt 132.7 lb

## 2021-06-22 DIAGNOSIS — D472 Monoclonal gammopathy: Secondary | ICD-10-CM | POA: Diagnosis not present

## 2021-06-22 DIAGNOSIS — R918 Other nonspecific abnormal finding of lung field: Secondary | ICD-10-CM | POA: Diagnosis not present

## 2021-06-22 DIAGNOSIS — R911 Solitary pulmonary nodule: Secondary | ICD-10-CM

## 2021-06-22 NOTE — Progress Notes (Signed)
Ricardo Garrett, [place] [address]   CLINIC:  Medical Oncology/Hematology  PCP:  [name], MD [address] [place] [address]  [phone]  REASON FOR VISIT:  Follow-up for MGUS and right lung nodule  PRIOR THERAPY: None  CURRENT THERAPY: Surveillance  INTERVAL HISTORY:  Ricardo Garrett, a 75 y.o. male, returns for routine follow-up for his MGUS and right lung nodule.   On exam today, Ricardo Garrett is accompanied by his daughter.  He reports that he has been well overall in the interim since her last visit.  He is on blood thinners which causes him to have frequent bruising and bleeding of his upper extremities.  He notes if he has a little cut it bleeds quite heavily.  He notes he is not having any issues with fevers, chills, sweats, nausea, ming or diarrhea.  His weight has been stable.  He otherwise has no questions concerns or complaints.  A full 10 point ROS is listed below.  The bulk of our visit focused on the imaging studies and the lab work for his lung nodule and MGUS.   REVIEW OF SYSTEMS:  Review of Systems  All other systems reviewed and are negative.  PAST MEDICAL/SURGICAL HISTORY:  Past Medical History:  Diagnosis Date   Arthritis    "finger on right hand" (11/23/2017)   Broken arm ~ 1952   "no OR; just reset it; not sure which side"   CAD (coronary artery disease)    CHF (congestive heart failure) ([place])    a. 12/2015: echo showing reduced EF of 35-40% b. NST: 09/2017: scar with peri-infarct ischemia, intermediate-risk study   COPD (chronic obstructive pulmonary disease) ([place])    "was on RX when he had CHF; not on anything now" (11/23/2017)   High cholesterol    HOH (hard of hearing)    Hypertension    Persistent atrial fibrillation ([place])    a. s/p DCCV in 03/2016   Past Surgical History:  Procedure Laterality Date   CARDIAC CATHETERIZATION  11/23/2017   CARDIOVERSION N/A 04/05/2016   Procedure:  CARDIOVERSION;  Surgeon: Josue Hector, MD;  [facility];  Service: Cardiovascular;  Laterality: N/A;   COMPRESSION HIP SCREW Right 03/15/2013   Procedure: COMPRESSION HIP;  Surgeon: Sanjuana Kava, MD;  Location: [facility];  Service: Orthopedics;  Laterality: Right;   CORONARY ATHERECTOMY N/A 11/24/2017   Procedure: CORONARY ATHERECTOMY;  Surgeon: Burnell Blanks, MD;  Location: [place] [facility] LAB;  Service: Cardiovascular;  Laterality: N/A;   CORONARY BALLOON ANGIOPLASTY N/A 11/24/2017   Procedure: CORONARY BALLOON ANGIOPLASTY;  Surgeon: Burnell Blanks, MD;  Location: [place] [facility];  Service: Cardiovascular;  Laterality: N/A;   CORONARY STENT INTERVENTION N/A 11/24/2017   Procedure: CORONARY STENT INTERVENTION;  Surgeon: Burnell Blanks, MD;  Location: [place] [facility] LAB;  Service: Cardiovascular;  Laterality: N/A;   FRACTURE SURGERY     PACEMAKER IMPLANT N/A 11/27/2017   Procedure: PACEMAKER IMPLANT;  Surgeon: Evans Lance, MD;  Location: [place] [facility] LAB;  Service: Cardiovascular;  Laterality: N/A;   RIGHT/LEFT HEART CATH AND CORONARY ANGIOGRAPHY N/A 11/23/2017   Procedure: RIGHT/LEFT HEART CATH AND CORONARY ANGIOGRAPHY;  Surgeon: Burnell Blanks, MD;  Location: [place] [facility] LAB;  Service: Cardiovascular;  Laterality: N/A;   SKIN GRAFT Left    from right thigh   TONSILLECTOMY      SOCIAL HISTORY:  Social History  Socioeconomic History   Marital status: Married    Spouse name: Not on file   Number of children: Not on file   Years of education: Not on file   Highest education level: Not on file  Occupational History   Not on file  Tobacco Use   Smoking status: Former    Packs/day: 1.00    Years: 20.00    Pack years: 20.00    Types: Cigarettes    Quit date: 11/01/1979    Years since quitting: 41.6   Smokeless tobacco: Never  Vaping Use   Vaping Use: Never used  Substance and Sexual Activity   Alcohol use: Yes    Comment:  11/23/2017 "beer once/month maybe"   Drug use: Yes    Types: Marijuana    Comment: 11/23/2017 "nothing for years"   Sexual activity: Yes    Birth control/protection: None  Other Topics Concern   Not on file  Social History Narrative   Not on file   Social Determinants of Health   Financial Resource Strain: Not on file  Food Insecurity: Not on file  Transportation Needs: Not on file  Physical Activity: Not on file  Stress: Not on file  Social Connections: Not on file  Intimate Partner Violence: Not on file    FAMILY HISTORY:  Family History  Problem Relation Age of Onset   Diabetes Mother    Heart attack Father     CURRENT MEDICATIONS:  Current Outpatient Medications  Medication Sig Dispense Refill   apixaban (ELIQUIS) 2.5 MG TABS tablet Take 1 tablet (2.5 mg total) by mouth 2 (two) times daily. 60 tablet 5   atorvastatin (LIPITOR) 80 MG tablet Take 1 tablet (80 mg total) by mouth daily. 90 tablet 3   clopidogrel (PLAVIX) 75 MG tablet Take 1 tablet by mouth once daily 90 tablet 3   digoxin (LANOXIN) 0.125 MG tablet Take 1 tablet (125 mcg total) by mouth daily. 90 tablet 3   furosemide (LASIX) 40 MG tablet Take 1 tablet (40 mg total) by mouth daily. 30 tablet 6   HYDROcodone-acetaminophen (NORCO/VICODIN) 5-325 MG tablet One tablet by mouth every six hours as needed for pain. 56 tablet 0   lisinopril (ZESTRIL) 40 MG tablet Take 40 mg by mouth daily.     LOKELMA 10 g PACK packet Take 1 packet by mouth daily.     losartan (COZAAR) 50 MG tablet Take 1 tablet by mouth daily.     metoprolol succinate (TOPROL-XL) 25 MG 24 hr tablet TAKE 1/2 (ONE-HALF) TABLET BY MOUTH IN THE MORNING AND 1 TABLET IN THE EVENING 135 tablet 3   tamsulosin (FLOMAX) 0.4 MG CAPS capsule Take 0.4 mg by mouth daily.     triamcinolone cream (KENALOG) 0.1 % SMARTSIG:Sparingly Topical Twice Daily     No current facility-administered medications for this visit.    ALLERGIES:  No Known Allergies  PHYSICAL  EXAM:  Performance status (ECOG): 1 - Symptomatic but completely ambulatory  Vitals:   06/22/21 1412  BP: (!) 135/92  Pulse: 60  Resp: 18  Temp: 97.7 F (36.5 C)  SpO2: 99%   Wt Readings from Last 3 Encounters:  06/22/21 132 lb 11.5 oz (60.2 kg)  04/21/21 133 lb 12.8 oz (60.7 kg)  12/21/20 141 lb 11.2 oz (64.3 kg)   Physical Exam Vitals reviewed.  Constitutional:      Appearance: Normal appearance.  Cardiovascular:     Rate and Rhythm: Normal rate and regular rhythm.  Heart sounds: Normal heart sounds.  Pulmonary:     Effort: Pulmonary effort is normal.     Breath sounds: Normal breath sounds.  Abdominal:     Palpations: Abdomen is soft.  Skin:    General: Skin is warm.  Neurological:     General: No focal deficit present.     Mental Status: He is alert and oriented to person, place, and time.  Psychiatric:        Mood and Affect: Mood normal.        Behavior: Behavior normal.    LABORATORY DATA:  I have reviewed the labs as listed.  CBC Latest Ref Rng & Units 06/17/2021 12/30/2020 12/14/2020  WBC 4.0 - 10.5 K/uL 7.3 7.3 12.6(H)  Hemoglobin 13.0 - 17.0 g/dL 13.9 14.1 14.3  Hematocrit 39.0 - 52.0 % 41.7 43.7 43.8  Platelets 150 - 400 K/uL 251 279 211   CMP Latest Ref Rng & Units 06/17/2021 12/30/2020 12/14/2020  Glucose 70 - 99 mg/dL 125(H) 109(H) 117(H)  BUN 8 - 23 mg/dL 40(H) 41(H) 26(H)  Creatinine 0.61 - 1.24 mg/dL 1.72(H) 1.85(H) 1.47(H)  Sodium 135 - 145 mmol/L 135 135 136  Potassium 3.5 - 5.1 mmol/L 5.0 4.5 3.9  Chloride 98 - 111 mmol/L 104 103 102  CO2 22 - 32 mmol/L '25 25 27  ' Calcium 8.9 - 10.3 mg/dL 8.6(L) 8.2(L) 9.0  Total Protein 6.5 - 8.1 g/dL 6.2(L) - 6.4(L)  Total Bilirubin 0.3 - 1.2 mg/dL 1.3(H) - 1.5(H)  Alkaline Phos 38 - 126 U/L 80 - 92  AST 15 - 41 U/L 19 - 16  ALT 0 - 44 U/L 17 - 14      Component Value Date/Time   RBC 4.06 (L) 06/17/2021 1144   MCV 102.7 (H) 06/17/2021 1144   MCV 89.2 02/12/2016 1100   MCH 34.2 (H) 06/17/2021 1144    MCHC 33.3 06/17/2021 1144   RDW 13.5 06/17/2021 1144   RDW 15.1 02/12/2016 1100   LYMPHSABS 1.3 06/17/2021 1144   MONOABS 0.6 06/17/2021 1144   EOSABS 0.2 06/17/2021 1144   BASOSABS 0.1 06/17/2021 1144   Lab Results  Component Value Date   TOTALPROTELP 5.7 (L) 06/17/2021   ALBUMINELP 3.2 06/17/2021   A1GS 0.2 06/17/2021   A2GS 0.6 06/17/2021   BETS 0.7 06/17/2021   GAMS 1.0 06/17/2021   MSPIKE 0.5 (H) 06/17/2021   SPEI Comment 06/17/2021    Lab Results  Component Value Date   KPAFRELGTCHN 41.7 (H) 06/17/2021   LAMBDASER 10.7 06/17/2021   KAPLAMBRATIO 3.90 (H) 06/17/2021    DIAGNOSTIC IMAGING:  I have independently reviewed the scans and discussed with the patient. CT Chest Wo Contrast  Result Date: 06/18/2021 CLINICAL DATA:  Pulmonary nodule.  MGUS. EXAM: CT CHEST WITHOUT CONTRAST TECHNIQUE: Multidetector CT imaging of the chest was performed following the standard protocol without IV contrast. COMPARISON:  05/05/2020 FINDINGS: Cardiovascular: The heart size is normal. No substantial pericardial effusion. Coronary artery calcification is evident. Moderate atherosclerotic calcification is noted in the wall of the thoracic aorta. Left permanent pacemaker. Mediastinum/Nodes: No mediastinal lymphadenopathy. No evidence for gross hilar lymphadenopathy although assessment is limited by the lack of intravenous contrast on today's study. The esophagus has normal imaging features. There is no axillary lymphadenopathy. Lungs/Pleura: Centrilobular emphsyema noted. 8 mm nodule in the posterior right costophrenic sulcus identified previously is minimally larger today at 9 mm (image 139/4). 4 mm right lower lobe nodule on 123/4 is new in the interval. 6 mm nodule  posterior left costophrenic sulcus on 121/4 is stable. Several additional scattered tiny bilateral pulmonary nodules are stable. No new suspicious nodule or mass. No focal airspace consolidation. No pleural effusion. Upper Abdomen: Tiny  low-density lesion posterior left liver is stable, compatible with a cyst. Musculoskeletal: No worrisome lytic or sclerotic osseous abnormality. IMPRESSION: 1. 8 mm posterior right costophrenic sulcus nodule identified previously is minimally larger today at 9 mm. Relative stability over more than a year suggests benign etiology. Follow-up CT could be performed in 12 months to ensure continued stability. 2. New 4 mm right lower lobe pulmonary nodule No follow-up needed if patient is low-risk. Non-contrast chest CT can be considered in 12 months if patient is high-risk. This recommendation follows the consensus statement: Guidelines for Management of Incidental Pulmonary Nodules Detected on CT Images: From the Fleischner Society 2017; Radiology 2017; 284:228-243. 3. Additional tiny pulmonary nodules are stable in the interval. 4. Aortic Atherosclerosis (ICD10-I70.0) and Emphysema (ICD10-J43.9). Electronically Signed   By: Misty Stanley M.D.   On: 06/18/2021 12:44   DG Bone Survey Met  Result Date: 06/18/2021 CLINICAL DATA:  MGUS. EXAM: METASTATIC BONE SURVEY COMPARISON:  Bone survey study dated 01/14/2020. FINDINGS: No lytic lesions identified. There is osteopenia. No acute fracture or dislocation. Old healed right femoral neck fracture deformity status post ORIF. The hardware is intact. Severe degenerative changes of the cervical spine and degenerative changes of the thoracic spine. Chronic appearing lower thoracic compression fractures. Left pectoral pacemaker device. Atherosclerotic calcification of the aorta. Vascular calcification. A 12 mm nodular density at the right lung base, likely nipple shadow. This can be confirmed by placement of nipple markers and repeat radiograph. IMPRESSION: No lytic lesions identified. Electronically Signed   By: Anner Crete M.D.   On: 06/18/2021 21:32     ASSESSMENT:  1.  IgG kappa MGUS: -Labs from 05/05/2020 was reviewed.  SPEP showed 0.4 g/dL which is stable. -Free  light chain ratio increased to 3.94.  Kappa light chains increased to 61.  Creatinine has also increased to 2.41 from 1.8 in March. -The elevated free light chain ratio is probably due to worsening kidney function.  He is on medications which could affect his creatinine.    2.  Right lower lobe lung nodule: -I reviewed results of CT chest without contrast on 05/05/2020 which showed right lower lobe 8 mm solid pulmonary nodule, obscured by atelectasis on 11/23/2015 chest CT.  No thoracic adenopathy.  Moderate centrilobular emphysema with mild diffuse bronchial wall thickening suggesting COPD.  Nonspecific patchy subpleural reticulation at the lung bases.  Interstitial lung disease including early UIP is not excluded. -He quit smoking 30 years ago.  Smoked 1 pack/day for 20 to 30 years.  He also worked as a Nature conservation officer.   PLAN:  1.  IgG kappa MGUS: -We reviewed myeloma labs from 06/17/2021.  M spike is 0.5 g.  Kappa light chains are 41.7 with ratio of 3.90. -He does not have any "crab" features.  Continue follow-ups at this time. -RTC 6 months with repeat labs.  We will also repeat skeletal survey yearly. Last done on 06/18/2021 showed no evidence of lytic lesions.  2.  Right lower lobe lung nodule: -Previous CT scan showed right lower lobe lung nodule. --last scan on 06/17/2021 showed minimal enlargement by 67m.  -I have recommended a follow-up CT scan of the chest in 12 months.  3.  Hyperkalemia: -Potassium today is 5.0.   4.  CKD: -Creatinine stable at 1.72.  Continue  follow-up with Dr. Theador Hawthorne.  Orders placed this encounter:  No orders of the defined types were placed in this encounter.  Ledell Peoples, MD Department of Hematology/Oncology [place] [facility] Phone: [phone] Pager: [phone] Email: [email or web address]

## 2021-06-23 ENCOUNTER — Ambulatory Visit (INDEPENDENT_AMBULATORY_CARE_PROVIDER_SITE_OTHER): Payer: Medicare HMO

## 2021-06-23 DIAGNOSIS — I495 Sick sinus syndrome: Secondary | ICD-10-CM

## 2021-06-23 LAB — CUP PACEART REMOTE DEVICE CHECK
Battery Remaining Longevity: 85 mo
Battery Remaining Percentage: 68 %
Battery Voltage: 3.01 V
Brady Statistic RV Percent Paced: 49 %
Date Time Interrogation Session: 20220824040013
Implantable Lead Implant Date: 20190128
Implantable Lead Implant Date: 20190128
Implantable Lead Location: 753859
Implantable Lead Location: 753860
Implantable Pulse Generator Implant Date: 20190128
Lead Channel Impedance Value: 540 Ohm
Lead Channel Pacing Threshold Amplitude: 0.75 V
Lead Channel Pacing Threshold Pulse Width: 0.5 ms
Lead Channel Sensing Intrinsic Amplitude: 12 mV
Lead Channel Setting Pacing Amplitude: 2.5 V
Lead Channel Setting Pacing Pulse Width: 0.5 ms
Lead Channel Setting Sensing Sensitivity: 2 mV
Pulse Gen Model: 2272
Pulse Gen Serial Number: 8982069

## 2021-06-30 DIAGNOSIS — I10 Essential (primary) hypertension: Secondary | ICD-10-CM | POA: Diagnosis not present

## 2021-06-30 DIAGNOSIS — E1165 Type 2 diabetes mellitus with hyperglycemia: Secondary | ICD-10-CM | POA: Diagnosis not present

## 2021-07-08 NOTE — Progress Notes (Signed)
Remote pacemaker transmission.   

## 2021-07-30 DIAGNOSIS — E1165 Type 2 diabetes mellitus with hyperglycemia: Secondary | ICD-10-CM | POA: Diagnosis not present

## 2021-07-30 DIAGNOSIS — I10 Essential (primary) hypertension: Secondary | ICD-10-CM | POA: Diagnosis not present

## 2021-08-30 DIAGNOSIS — E1165 Type 2 diabetes mellitus with hyperglycemia: Secondary | ICD-10-CM | POA: Diagnosis not present

## 2021-08-30 DIAGNOSIS — I10 Essential (primary) hypertension: Secondary | ICD-10-CM | POA: Diagnosis not present

## 2021-09-03 DIAGNOSIS — G3184 Mild cognitive impairment, so stated: Secondary | ICD-10-CM | POA: Diagnosis not present

## 2021-09-03 DIAGNOSIS — R69 Illness, unspecified: Secondary | ICD-10-CM | POA: Diagnosis not present

## 2021-09-03 DIAGNOSIS — I739 Peripheral vascular disease, unspecified: Secondary | ICD-10-CM | POA: Diagnosis not present

## 2021-09-03 DIAGNOSIS — E785 Hyperlipidemia, unspecified: Secondary | ICD-10-CM | POA: Diagnosis not present

## 2021-09-03 DIAGNOSIS — D6869 Other thrombophilia: Secondary | ICD-10-CM | POA: Diagnosis not present

## 2021-09-03 DIAGNOSIS — I4891 Unspecified atrial fibrillation: Secondary | ICD-10-CM | POA: Diagnosis not present

## 2021-09-03 DIAGNOSIS — I130 Hypertensive heart and chronic kidney disease with heart failure and stage 1 through stage 4 chronic kidney disease, or unspecified chronic kidney disease: Secondary | ICD-10-CM | POA: Diagnosis not present

## 2021-09-03 DIAGNOSIS — I2510 Atherosclerotic heart disease of native coronary artery without angina pectoris: Secondary | ICD-10-CM | POA: Diagnosis not present

## 2021-09-03 DIAGNOSIS — E261 Secondary hyperaldosteronism: Secondary | ICD-10-CM | POA: Diagnosis not present

## 2021-09-03 DIAGNOSIS — I509 Heart failure, unspecified: Secondary | ICD-10-CM | POA: Diagnosis not present

## 2021-09-10 DIAGNOSIS — E1165 Type 2 diabetes mellitus with hyperglycemia: Secondary | ICD-10-CM | POA: Diagnosis not present

## 2021-09-10 DIAGNOSIS — E208 Other hypoparathyroidism: Secondary | ICD-10-CM | POA: Diagnosis not present

## 2021-09-10 DIAGNOSIS — E782 Mixed hyperlipidemia: Secondary | ICD-10-CM | POA: Diagnosis not present

## 2021-09-14 DIAGNOSIS — I129 Hypertensive chronic kidney disease with stage 1 through stage 4 chronic kidney disease, or unspecified chronic kidney disease: Secondary | ICD-10-CM | POA: Diagnosis not present

## 2021-09-14 DIAGNOSIS — E875 Hyperkalemia: Secondary | ICD-10-CM | POA: Diagnosis not present

## 2021-09-14 DIAGNOSIS — D472 Monoclonal gammopathy: Secondary | ICD-10-CM | POA: Diagnosis not present

## 2021-09-14 DIAGNOSIS — Z0001 Encounter for general adult medical examination with abnormal findings: Secondary | ICD-10-CM | POA: Diagnosis not present

## 2021-09-14 DIAGNOSIS — E782 Mixed hyperlipidemia: Secondary | ICD-10-CM | POA: Diagnosis not present

## 2021-09-14 DIAGNOSIS — D1430 Benign neoplasm of unspecified bronchus and lung: Secondary | ICD-10-CM | POA: Diagnosis not present

## 2021-09-14 DIAGNOSIS — F1010 Alcohol abuse, uncomplicated: Secondary | ICD-10-CM | POA: Insufficient documentation

## 2021-09-14 DIAGNOSIS — I482 Chronic atrial fibrillation, unspecified: Secondary | ICD-10-CM | POA: Diagnosis not present

## 2021-09-14 DIAGNOSIS — E208 Other hypoparathyroidism: Secondary | ICD-10-CM | POA: Diagnosis not present

## 2021-09-14 DIAGNOSIS — J449 Chronic obstructive pulmonary disease, unspecified: Secondary | ICD-10-CM | POA: Diagnosis not present

## 2021-09-14 DIAGNOSIS — N1832 Chronic kidney disease, stage 3b: Secondary | ICD-10-CM | POA: Diagnosis not present

## 2021-09-22 ENCOUNTER — Ambulatory Visit (INDEPENDENT_AMBULATORY_CARE_PROVIDER_SITE_OTHER): Payer: Medicare HMO

## 2021-09-22 DIAGNOSIS — I495 Sick sinus syndrome: Secondary | ICD-10-CM | POA: Diagnosis not present

## 2021-09-22 LAB — CUP PACEART REMOTE DEVICE CHECK
Battery Remaining Longevity: 83 mo
Battery Remaining Percentage: 66 %
Battery Voltage: 3.01 V
Brady Statistic RV Percent Paced: 50 %
Date Time Interrogation Session: 20221123040013
Implantable Lead Implant Date: 20190128
Implantable Lead Implant Date: 20190128
Implantable Lead Location: 753859
Implantable Lead Location: 753860
Implantable Pulse Generator Implant Date: 20190128
Lead Channel Impedance Value: 530 Ohm
Lead Channel Pacing Threshold Amplitude: 0.75 V
Lead Channel Pacing Threshold Pulse Width: 0.5 ms
Lead Channel Sensing Intrinsic Amplitude: 11.2 mV
Lead Channel Setting Pacing Amplitude: 2.5 V
Lead Channel Setting Pacing Pulse Width: 0.5 ms
Lead Channel Setting Sensing Sensitivity: 2 mV
Pulse Gen Model: 2272
Pulse Gen Serial Number: 8982069

## 2021-09-29 DIAGNOSIS — I10 Essential (primary) hypertension: Secondary | ICD-10-CM | POA: Diagnosis not present

## 2021-09-29 DIAGNOSIS — E782 Mixed hyperlipidemia: Secondary | ICD-10-CM | POA: Diagnosis not present

## 2021-10-04 NOTE — Progress Notes (Signed)
Remote pacemaker transmission.   

## 2021-10-29 DIAGNOSIS — I10 Essential (primary) hypertension: Secondary | ICD-10-CM | POA: Diagnosis not present

## 2021-10-29 DIAGNOSIS — E782 Mixed hyperlipidemia: Secondary | ICD-10-CM | POA: Diagnosis not present

## 2021-11-04 ENCOUNTER — Encounter: Payer: Self-pay | Admitting: Internal Medicine

## 2021-11-04 ENCOUNTER — Ambulatory Visit (INDEPENDENT_AMBULATORY_CARE_PROVIDER_SITE_OTHER): Payer: Medicare HMO | Admitting: Internal Medicine

## 2021-11-04 ENCOUNTER — Other Ambulatory Visit: Payer: Self-pay

## 2021-11-04 VITALS — BP 142/80 | HR 77 | Ht 68.0 in | Wt 134.6 lb

## 2021-11-04 DIAGNOSIS — I495 Sick sinus syndrome: Secondary | ICD-10-CM | POA: Diagnosis not present

## 2021-11-04 NOTE — Progress Notes (Signed)
HPI [name] returns today for followup. He is a 76 yo man with a h/o sinus node dysfunction and heart block, s/p PPM insertion. He has broken his right leg. He had surgery. He is limited by his right leg with ambulation. He has not had palpitations. He denies anginal symptoms. He notes that his hearing has gotten worse but he does not like to wear his hearing aid. No Known Allergies   Current Outpatient Medications  Medication Sig Dispense Refill   apixaban (ELIQUIS) 2.5 MG TABS tablet Take 1 tablet (2.5 mg total) by mouth 2 (two) times daily. 60 tablet 5   atorvastatin (LIPITOR) 80 MG tablet Take 1 tablet (80 mg total) by mouth daily. 90 tablet 3   clopidogrel (PLAVIX) 75 MG tablet Take 1 tablet by mouth once daily 90 tablet 3   digoxin (LANOXIN) 0.125 MG tablet Take 1 tablet (125 mcg total) by mouth daily. 90 tablet 3   furosemide (LASIX) 40 MG tablet Take 1 tablet (40 mg total) by mouth daily. 30 tablet 6   HYDROcodone-acetaminophen (NORCO/VICODIN) 5-325 MG tablet One tablet by mouth every six hours as needed for pain. 56 tablet 0   lisinopril (ZESTRIL) 40 MG tablet Take 40 mg by mouth daily.     LOKELMA 10 g PACK packet Take 1 packet by mouth daily.     losartan (COZAAR) 50 MG tablet Take 1 tablet by mouth daily.     metoprolol succinate (TOPROL-XL) 25 MG 24 hr tablet TAKE 1/2 (ONE-HALF) TABLET BY MOUTH IN THE MORNING AND 1 TABLET IN THE EVENING 135 tablet 3   tamsulosin (FLOMAX) 0.4 MG CAPS capsule Take 0.4 mg by mouth daily.     triamcinolone cream (KENALOG) 0.1 % SMARTSIG:Sparingly Topical Twice Daily     No current facility-administered medications for this visit.     Past Medical History:  Diagnosis Date   Arthritis    "finger on right hand" (11/23/2017)   Broken arm ~ 1952   "no OR; just reset it; not sure which side"   CAD (coronary artery disease)    CHF (congestive heart failure) ([place])    a. 12/2015: echo showing reduced EF of 35-40% b. NST: 09/2017: scar  with peri-infarct ischemia, intermediate-risk study   COPD (chronic obstructive pulmonary disease) ([place])    "was on RX when he had CHF; not on anything now" (11/23/2017)   High cholesterol    HOH (hard of hearing)    Hypertension    Persistent atrial fibrillation ([place])    a. s/p DCCV in 03/2016    ROS:   All systems reviewed and negative except as noted in the HPI.   Past Surgical History:  Procedure Laterality Date   CARDIAC CATHETERIZATION  11/23/2017   CARDIOVERSION N/A 04/05/2016   Procedure: CARDIOVERSION;  Surgeon: Josue Hector, MD;  [facility];  Service: Cardiovascular;  Laterality: N/A;   COMPRESSION HIP SCREW Right 03/15/2013   Procedure: COMPRESSION HIP;  Surgeon: Sanjuana Kava, MD;  Location: [facility];  Service: Orthopedics;  Laterality: Right;   CORONARY ATHERECTOMY N/A 11/24/2017   Procedure: CORONARY ATHERECTOMY;  Surgeon: Burnell Blanks, MD;  Location: [place] [facility];  Service: Cardiovascular;  Laterality: N/A;   CORONARY BALLOON ANGIOPLASTY N/A 11/24/2017   Procedure: CORONARY BALLOON ANGIOPLASTY;  Surgeon: Burnell Blanks, MD;  Location: [place] [facility];  Service: Cardiovascular;  Laterality: N/A;   CORONARY STENT INTERVENTION N/A 11/24/2017   Procedure: CORONARY STENT INTERVENTION;  Surgeon: Burnell Blanks, MD;  Location: [place] [facility];  Service: Cardiovascular;  Laterality: N/A;   FRACTURE SURGERY     PACEMAKER IMPLANT N/A 11/27/2017   Procedure: PACEMAKER IMPLANT;  Surgeon: Evans Lance, MD;  Location: [place] [facility];  Service: Cardiovascular;  Laterality: N/A;   RIGHT/LEFT HEART CATH AND CORONARY ANGIOGRAPHY N/A 11/23/2017   Procedure: RIGHT/LEFT HEART CATH AND CORONARY ANGIOGRAPHY;  Surgeon: Burnell Blanks, MD;  Location: [place] [facility];  Service: Cardiovascular;  Laterality: N/A;   SKIN GRAFT Left    from right thigh   TONSILLECTOMY       Family History  Problem Relation Age of Onset    Diabetes Mother    Heart attack Father      Social History   Socioeconomic History   Marital status: Married    Spouse name: Not on file   Number of children: Not on file   Years of education: Not on file   Highest education level: Not on file  Occupational History   Not on file  Tobacco Use   Smoking status: Former    Packs/day: 1.00    Years: 20.00    Pack years: 20.00    Types: Cigarettes    Quit date: 11/01/1979    Years since quitting: 42.0   Smokeless tobacco: Never  Vaping Use   Vaping Use: Never used  Substance and Sexual Activity   Alcohol use: Yes    Comment: 11/23/2017 "beer once/month maybe"   Drug use: Yes    Types: Marijuana    Comment: 11/23/2017 "nothing for years"   Sexual activity: Yes    Birth control/protection: None  Other Topics Concern   Not on file  Social History Narrative   Not on file   Social Determinants of Health   Financial Resource Strain: Not on file  Food Insecurity: Not on file  Transportation Needs: Not on file  Physical Activity: Not on file  Stress: Not on file  Social Connections: Not on file  Intimate Partner Violence: Not on file     BP (!) 142/80    Pulse 77    Ht 5\' 8"  (1.727 m)    Wt 134 lb 9.6 oz (61.1 kg)    SpO2 99%    BMI 20.47 kg/m   Physical Exam:  Well appearing NAD HEENT: Unremarkable Neck:  No JVD, no thyromegally Lymphatics:  No adenopathy Back:  No CVA tenderness Lungs:  Clear with no wheezes HEART:  Regular rate rhythm, no murmurs, no rubs, no clicks Abd:  soft, positive bowel sounds, no organomegally, no rebound, no guarding Ext:  2 plus pulses, no edema, no cyanosis, no clubbing Skin:  No rashes no nodules Neuro:  CN II through XII intact, motor grossly intact  DEVICE  Normal device function.  See PaceArt for details.   Assess/Plan:  1. Atrial fib - his VR is well controlled. No change in his meds. 2. Heart block - he is pacing about 50% of the time.  3. HTN - his bp is well controlled. 4.  PPM -his St[employer] Jude PPM is working normally. He has over 7 years of battery longevity.   Salome Spotted.

## 2021-11-04 NOTE — Patient Instructions (Signed)
Medication Instructions:  Your physician recommends that you continue on your current medications as directed. Please refer to the Current Medication list given to you today.  *If you need a refill on your cardiac medications before your next appointment, please call your pharmacy*   Lab Work: NONE   If you have labs (blood work) drawn today and your tests are completely normal, you will receive your results only by: . MyChart Message (if you have MyChart) OR . A paper copy in the mail If you have any lab test that is abnormal or we need to change your treatment, we will call you to review the results.   Testing/Procedures: NONE    Follow-Up: At [facility], you and your health needs are our priority.  As part of our continuing mission to provide you with exceptional heart care, we have created designated Provider Care Teams.  These Care Teams include your primary Cardiologist (physician) and Advanced Practice Providers (APPs -  Physician Assistants and Nurse Practitioners) who all work together to provide you with the care you need, when you need it.  We recommend signing up for the patient portal called "MyChart".  Sign up information is provided on this After Visit Summary.  MyChart is used to connect with patients for Virtual Visits (Telemedicine).  Patients are able to view lab/test results, encounter notes, upcoming appointments, etc.  Non-urgent messages can be sent to your provider as well.   To learn more about what you can do with MyChart, go to [email or web address].    Your next appointment:   1 year(s)  The format for your next appointment:   In Person  Provider:   Gregg Taylor, MD   Other Instructions Thank you for choosing [facility]!    

## 2021-11-30 ENCOUNTER — Telehealth: Payer: Self-pay

## 2021-11-30 DIAGNOSIS — E782 Mixed hyperlipidemia: Secondary | ICD-10-CM | POA: Diagnosis not present

## 2021-11-30 DIAGNOSIS — I10 Essential (primary) hypertension: Secondary | ICD-10-CM | POA: Diagnosis not present

## 2021-11-30 NOTE — Telephone Encounter (Signed)
Left message to schedule palliative consult visit

## 2021-12-07 NOTE — Progress Notes (Signed)
Reviewed at 06/22/21 OV by Dr. Lorenso Courier

## 2021-12-08 ENCOUNTER — Other Ambulatory Visit: Payer: Self-pay

## 2021-12-21 ENCOUNTER — Telehealth: Payer: Self-pay

## 2021-12-21 NOTE — Telephone Encounter (Signed)
Left second message to schedule home palliative care visit

## 2021-12-28 DIAGNOSIS — I10 Essential (primary) hypertension: Secondary | ICD-10-CM | POA: Diagnosis not present

## 2021-12-28 DIAGNOSIS — E782 Mixed hyperlipidemia: Secondary | ICD-10-CM | POA: Diagnosis not present

## 2022-01-03 ENCOUNTER — Other Ambulatory Visit: Payer: Self-pay

## 2022-01-03 ENCOUNTER — Inpatient Hospital Stay (HOSPITAL_COMMUNITY): Payer: Medicare HMO | Attending: Hematology

## 2022-01-03 ENCOUNTER — Other Ambulatory Visit (HOSPITAL_COMMUNITY): Payer: Self-pay

## 2022-01-03 DIAGNOSIS — Z87891 Personal history of nicotine dependence: Secondary | ICD-10-CM | POA: Diagnosis not present

## 2022-01-03 DIAGNOSIS — Z7901 Long term (current) use of anticoagulants: Secondary | ICD-10-CM | POA: Insufficient documentation

## 2022-01-03 DIAGNOSIS — D472 Monoclonal gammopathy: Secondary | ICD-10-CM | POA: Insufficient documentation

## 2022-01-03 DIAGNOSIS — Z79899 Other long term (current) drug therapy: Secondary | ICD-10-CM | POA: Diagnosis not present

## 2022-01-03 DIAGNOSIS — R911 Solitary pulmonary nodule: Secondary | ICD-10-CM | POA: Diagnosis not present

## 2022-01-03 DIAGNOSIS — N189 Chronic kidney disease, unspecified: Secondary | ICD-10-CM | POA: Diagnosis not present

## 2022-01-03 LAB — CBC WITH DIFFERENTIAL/PLATELET
Abs Immature Granulocytes: 0.03 10*3/uL (ref 0.00–0.07)
Basophils Absolute: 0 10*3/uL (ref 0.0–0.1)
Basophils Relative: 1 %
Eosinophils Absolute: 0.1 10*3/uL (ref 0.0–0.5)
Eosinophils Relative: 2 %
HCT: 41.4 % (ref 39.0–52.0)
Hemoglobin: 13.7 g/dL (ref 13.0–17.0)
Immature Granulocytes: 1 %
Lymphocytes Relative: 18 %
Lymphs Abs: 0.9 10*3/uL (ref 0.7–4.0)
MCH: 33.6 pg (ref 26.0–34.0)
MCHC: 33.1 g/dL (ref 30.0–36.0)
MCV: 101.5 fL — ABNORMAL HIGH (ref 80.0–100.0)
Monocytes Absolute: 0.8 10*3/uL (ref 0.1–1.0)
Monocytes Relative: 16 %
Neutro Abs: 3.2 10*3/uL (ref 1.7–7.7)
Neutrophils Relative %: 62 %
Platelets: 243 10*3/uL (ref 150–400)
RBC: 4.08 MIL/uL — ABNORMAL LOW (ref 4.22–5.81)
RDW: 13.9 % (ref 11.5–15.5)
WBC: 5.1 10*3/uL (ref 4.0–10.5)
nRBC: 0 % (ref 0.0–0.2)

## 2022-01-03 LAB — COMPREHENSIVE METABOLIC PANEL
ALT: 16 U/L (ref 0–44)
AST: 17 U/L (ref 15–41)
Albumin: 3.3 g/dL — ABNORMAL LOW (ref 3.5–5.0)
Alkaline Phosphatase: 89 U/L (ref 38–126)
Anion gap: 6 (ref 5–15)
BUN: 34 mg/dL — ABNORMAL HIGH (ref 8–23)
CO2: 24 mmol/L (ref 22–32)
Calcium: 8.4 mg/dL — ABNORMAL LOW (ref 8.9–10.3)
Chloride: 103 mmol/L (ref 98–111)
Creatinine, Ser: 1.71 mg/dL — ABNORMAL HIGH (ref 0.61–1.24)
GFR, Estimated: 41 mL/min — ABNORMAL LOW (ref >60)
Glucose, Bld: 99 mg/dL (ref 70–99)
Potassium: 4.7 mmol/L (ref 3.5–5.1)
Sodium: 133 mmol/L — ABNORMAL LOW (ref 135–145)
Total Bilirubin: 1.1 mg/dL (ref 0.3–1.2)
Total Protein: 6.4 g/dL — ABNORMAL LOW (ref 6.5–8.1)

## 2022-01-04 ENCOUNTER — Inpatient Hospital Stay (HOSPITAL_COMMUNITY): Payer: Medicare HMO

## 2022-01-04 LAB — KAPPA/LAMBDA LIGHT CHAINS
Kappa free light chain: 55.3 mg/L — ABNORMAL HIGH (ref 3.3–19.4)
Kappa, lambda light chain ratio: 3.61 — ABNORMAL HIGH (ref 0.26–1.65)
Lambda free light chains: 15.3 mg/L (ref 5.7–26.3)

## 2022-01-05 LAB — PROTEIN ELECTROPHORESIS, SERUM
A/G Ratio: 1.1 (ref 0.7–1.7)
Albumin ELP: 3.1 g/dL (ref 2.9–4.4)
Alpha-1-Globulin: 0.3 g/dL (ref 0.0–0.4)
Alpha-2-Globulin: 0.7 g/dL (ref 0.4–1.0)
Beta Globulin: 0.7 g/dL (ref 0.7–1.3)
Gamma Globulin: 1 g/dL (ref 0.4–1.8)
Globulin, Total: 2.7 g/dL (ref 2.2–3.9)
M-Spike, %: 0.6 g/dL — ABNORMAL HIGH
Total Protein ELP: 5.8 g/dL — ABNORMAL LOW (ref 6.0–8.5)

## 2022-01-10 DIAGNOSIS — R7303 Prediabetes: Secondary | ICD-10-CM | POA: Diagnosis not present

## 2022-01-10 DIAGNOSIS — E782 Mixed hyperlipidemia: Secondary | ICD-10-CM | POA: Diagnosis not present

## 2022-01-10 DIAGNOSIS — M1A9XX1 Chronic gout, unspecified, with tophus (tophi): Secondary | ICD-10-CM | POA: Diagnosis not present

## 2022-01-11 ENCOUNTER — Inpatient Hospital Stay (HOSPITAL_COMMUNITY): Payer: Medicare HMO | Admitting: Hematology

## 2022-01-11 ENCOUNTER — Other Ambulatory Visit: Payer: Self-pay

## 2022-01-11 VITALS — BP 143/85 | HR 73 | Temp 96.8°F | Resp 18 | Wt 128.3 lb

## 2022-01-11 DIAGNOSIS — D472 Monoclonal gammopathy: Secondary | ICD-10-CM | POA: Diagnosis not present

## 2022-01-11 DIAGNOSIS — N189 Chronic kidney disease, unspecified: Secondary | ICD-10-CM | POA: Diagnosis not present

## 2022-01-11 DIAGNOSIS — Z87891 Personal history of nicotine dependence: Secondary | ICD-10-CM | POA: Diagnosis not present

## 2022-01-11 DIAGNOSIS — Z79899 Other long term (current) drug therapy: Secondary | ICD-10-CM | POA: Diagnosis not present

## 2022-01-11 DIAGNOSIS — Z7901 Long term (current) use of anticoagulants: Secondary | ICD-10-CM | POA: Diagnosis not present

## 2022-01-11 DIAGNOSIS — R911 Solitary pulmonary nodule: Secondary | ICD-10-CM | POA: Diagnosis not present

## 2022-01-11 NOTE — Patient Instructions (Signed)
[place] at [facility] ?Discharge Instructions ? ?You were seen and examined today by Dr. Delton Coombes. He reviewed your most recent labs and everything looks stable. Drink 2-3 liters of water daily to help with your kidneys. Please keep follow up appointments as scheduled in 6 months. ? ? ?Thank you for choosing [place] at [facility] to provide your oncology and hematology care.  To afford each patient quality time with our provider, please arrive at least 15 minutes before your scheduled appointment time.  ? ?If you have a lab appointment with the [facility] please come in thru the Main Entrance and check in at the main information desk. ? ?You need to re-schedule your appointment should you arrive 10 or more minutes late.  We strive to give you quality time with our providers, and arriving late affects you and other patients whose appointments are after yours.  Also, if you no show three or more times for appointments you may be dismissed from the clinic at the providers discretion.     ?Again, thank you for choosing [facility].  Our hope is that these requests will decrease the amount of time that you wait before being seen by our physicians.       ?_____________________________________________________________ ? ?Should you have questions after your visit to [facility], please contact our office at [phone] and follow the prompts.  Our office hours are 8:00 a.m. and 4:30 p.m. Monday - Friday.  Please note that voicemails left after 4:00 p.m. may not be returned until the following business day.  We are closed weekends and major holidays.  You do have access to a nurse 24-7, just call the main number to the clinic [phone] and do not press any options, hold on the line and a nurse will answer the phone.   ? ?For prescription refill requests, have your pharmacy contact our office and allow 72 hours.   ? ?Due to Covid, you  will need to wear a mask upon entering the hospital. If you do not have a mask, a mask will be given to you at the Main Entrance upon arrival. For doctor visits, patients may have 1 support person age 65 or older with them. For treatment visits, patients can not have anyone with them due to social distancing guidelines and our immunocompromised population.  ? ?  ?

## 2022-01-11 NOTE — Progress Notes (Signed)
? ?[facility] ?[address] ?[place], [place] [address] ? ? ?CLINIC:  ?Medical Oncology/Hematology ? ?PCP:  ?[name], MD ?[address] [place] [address]  ?[phone] ? ?REASON FOR VISIT:  ?Follow-up for MGUS and right lung nodule ? ?PRIOR THERAPY: none ? ?CURRENT THERAPY: surveillance ? ?INTERVAL HISTORY:  ?[name], a 76 y.o. male, returns for routine follow-up for his MGUS and right lung nodule. Paton was last seen on 12/21/2020. ? ?Today he reports feeling good. He reports he does not frequently take OTC pain medications.  ? ?REVIEW OF SYSTEMS:  ?Review of Systems  ?Constitutional:  Negative for appetite change and fatigue.  ?Genitourinary:  Positive for frequency.   ?Psychiatric/Behavioral:  Positive for sleep disturbance.   ?All other systems reviewed and are negative. ? ?PAST MEDICAL/SURGICAL HISTORY:  ?Past Medical History:  ?Diagnosis Date  ? Arthritis   ? "finger on right hand" (11/23/2017)  ? Broken arm ~ 1952  ? "no OR; just reset it; not sure which side"  ? CAD (coronary artery disease)   ? CHF (congestive heart failure) ([place])   ? a. 12/2015: echo showing reduced EF of 35-40% b. NST: 09/2017: scar with peri-infarct ischemia, intermediate-risk study  ? COPD (chronic obstructive pulmonary disease) ([place])   ? "was on RX when he had CHF; not on anything now" (11/23/2017)  ? High cholesterol   ? HOH (hard of hearing)   ? Hypertension   ? Persistent atrial fibrillation ([place])   ? a. s/p DCCV in 03/2016  ? ?Past Surgical History:  ?Procedure Laterality Date  ? CARDIAC CATHETERIZATION  11/23/2017  ? CARDIOVERSION N/A 04/05/2016  ? Procedure: CARDIOVERSION;  Surgeon: Josue Hector, MD;  [facility];  Service: Cardiovascular;  Laterality: N/A;  ? COMPRESSION HIP SCREW Right 03/15/2013  ? Procedure: COMPRESSION HIP;  Surgeon: Sanjuana Kava, MD;  Location: [facility];  Service: Orthopedics;  Laterality: Right;  ? CORONARY ATHERECTOMY N/A 11/24/2017  ? Procedure: CORONARY  ATHERECTOMY;  Surgeon: Burnell Blanks, MD;  Location: [place] [facility];  Service: Cardiovascular;  Laterality: N/A;  ? CORONARY BALLOON ANGIOPLASTY N/A 11/24/2017  ? Procedure: CORONARY BALLOON ANGIOPLASTY;  Surgeon: Burnell Blanks, MD;  Location: [place] [facility];  Service: Cardiovascular;  Laterality: N/A;  ? CORONARY STENT INTERVENTION N/A 11/24/2017  ? Procedure: CORONARY STENT INTERVENTION;  Surgeon: Burnell Blanks, MD;  Location: [place] [facility];  Service: Cardiovascular;  Laterality: N/A;  ? FRACTURE SURGERY    ? PACEMAKER IMPLANT N/A 11/27/2017  ? Procedure: PACEMAKER IMPLANT;  Surgeon: Evans Lance, MD;  Location: [place] [facility];  Service: Cardiovascular;  Laterality: N/A;  ? RIGHT/LEFT HEART CATH AND CORONARY ANGIOGRAPHY N/A 11/23/2017  ? Procedure: RIGHT/LEFT HEART CATH AND CORONARY ANGIOGRAPHY;  Surgeon: Burnell Blanks, MD;  Location: [place] [facility];  Service: Cardiovascular;  Laterality: N/A;  ? SKIN GRAFT Left   ? from right thigh  ? TONSILLECTOMY    ? ? ?SOCIAL HISTORY:  ?Social History  ? ?Socioeconomic History  ? Marital status: Married  ?  Spouse name: Not on file  ? Number of children: Not on file  ? Years of education: Not on file  ? Highest education level: Not on file  ?Occupational History  ? Not on file  ?Tobacco Use  ? Smoking status: Former  ?  Packs/day: 1.00  ?  Years: 20.00  ?  Pack years: 20.00  ?  Types: Cigarettes  ?  Quit date: 11/01/1979  ?  Years since quitting: 42.2  ? Smokeless tobacco: Never  ?Vaping Use  ? Vaping Use: Never used  ?Substance and Sexual Activity  ? Alcohol use: Yes  ?  Comment: 11/23/2017 "beer once/month maybe"  ? Drug use: Yes  ?  Types: Marijuana  ?  Comment: 11/23/2017 "nothing for years"  ? Sexual activity: Yes  ?  Birth control/protection: None  ?Other Topics Concern  ? Not on file  ?Social History Narrative  ? Not on file  ? ?Social Determinants of Health  ? ?Financial Resource Strain: Not on file  ?Food  Insecurity: Not on file  ?Transportation Needs: Not on file  ?Physical Activity: Not on file  ?Stress: Not on file  ?Social Connections: Not on file  ?Intimate Partner Violence: Not on file  ? ? ?FAMILY HISTORY:  ?Family History  ?Problem Relation Age of Onset  ? Diabetes Mother   ? Heart attack Father   ? ? ?CURRENT MEDICATIONS:  ?Current Outpatient Medications  ?Medication Sig Dispense Refill  ? apixaban (ELIQUIS) 2.5 MG TABS tablet Take 1 tablet (2.5 mg total) by mouth 2 (two) times daily. 60 tablet 5  ? atorvastatin (LIPITOR) 80 MG tablet Take 1 tablet (80 mg total) by mouth daily. 90 tablet 3  ? clopidogrel (PLAVIX) 75 MG tablet Take 1 tablet by mouth once daily 90 tablet 3  ? diclofenac Sodium (VOLTAREN) 1 % GEL diclofenac 1 % topical gel ? APPLY 2 GRAMS TO THE AFFECTED AREA 4 TIMES PER DAY    ? digoxin (LANOXIN) 0.125 MG tablet Take 1 tablet (125 mcg total) by mouth daily. 90 tablet 3  ? furosemide (LASIX) 40 MG tablet Take 1 tablet (40 mg total) by mouth daily. 30 tablet 6  ? HYDROcodone-acetaminophen (NORCO) 7.5-325 MG tablet hydrocodone 7.5 mg-acetaminophen 325 mg tablet ? TAKE ONE TABLET BY MOUTH twice daily    ? lisinopril (ZESTRIL) 40 MG tablet Take 40 mg by mouth daily.    ? LOKELMA 10 g PACK packet Take 1 packet by mouth daily.    ? losartan (COZAAR) 50 MG tablet Take 1 tablet by mouth daily.    ? metoprolol succinate (TOPROL-XL) 25 MG 24 hr tablet TAKE 1/2 (ONE-HALF) TABLET BY MOUTH IN THE MORNING AND 1 TABLET IN THE EVENING 135 tablet 3  ? tamsulosin (FLOMAX) 0.4 MG CAPS capsule Take 0.4 mg by mouth daily.    ? triamcinolone cream (KENALOG) 0.1 % SMARTSIG:Sparingly Topical Twice Daily    ? ?No current facility-administered medications for this visit.  ? ? ?ALLERGIES:  ?No Known Allergies ? ?PHYSICAL EXAM:  ?Performance status (ECOG): 1 - Symptomatic but completely ambulatory ? ?Vitals:  ? 01/11/22 1407  ?BP: (!) 143/85  ?Pulse: 73  ?Resp: 18  ?Temp: (!) 96.8 ?F (36 ?C)  ?SpO2: 98%  ? ?Wt Readings  from Last 3 Encounters:  ?01/11/22 128 lb 4.8 oz (58.2 kg)  ?11/04/21 134 lb 9.6 oz (61.1 kg)  ?06/22/21 132 lb 11.5 oz (60.2 kg)  ? ?Physical Exam ?Vitals reviewed.  ?Constitutional:   ?   Appearance: Normal appearance.  ?Cardiovascular:  ?   Rate and Rhythm: Normal rate and regular rhythm.  ?   Pulses: Normal pulses.  ?   Heart sounds: Normal heart sounds.  ?Pulmonary:  ?   Effort: Pulmonary effort is normal.  ?   Breath sounds: Normal breath sounds.  ?Neurological:  ?   General: No focal deficit present.  ?  Mental Status: He is alert and oriented to person, place, and time.  ?Psychiatric:     ?   Mood and Affect: Mood normal.     ?   Behavior: Behavior normal.  ? ? ?LABORATORY DATA:  ?I have reviewed the labs as listed.  ?CBC Latest Ref Rng & Units 01/03/2022 06/17/2021 12/30/2020  ?WBC 4.0 - 10.5 K/uL 5.1 7.3 7.3  ?Hemoglobin 13.0 - 17.0 g/dL 13.7 13.9 14.1  ?Hematocrit 39.0 - 52.0 % 41.4 41.7 43.7  ?Platelets 150 - 400 K/uL 243 251 279  ? ?CMP Latest Ref Rng & Units 01/03/2022 06/17/2021 12/30/2020  ?Glucose 70 - 99 mg/dL 99 125(H) 109(H)  ?BUN 8 - 23 mg/dL 34(H) 40(H) 41(H)  ?Creatinine 0.61 - 1.24 mg/dL 1.71(H) 1.72(H) 1.85(H)  ?Sodium 135 - 145 mmol/L 133(L) 135 135  ?Potassium 3.5 - 5.1 mmol/L 4.7 5.0 4.5  ?Chloride 98 - 111 mmol/L 103 104 103  ?CO2 22 - 32 mmol/L '24 25 25  '$ ?Calcium 8.9 - 10.3 mg/dL 8.4(L) 8.6(L) 8.2(L)  ?Total Protein 6.5 - 8.1 g/dL 6.4(L) 6.2(L) -  ?Total Bilirubin 0.3 - 1.2 mg/dL 1.1 1.3(H) -  ?Alkaline Phos 38 - 126 U/L 89 80 -  ?AST 15 - 41 U/L 17 19 -  ?ALT 0 - 44 U/L 16 17 -  ? ?   ?Component Value Date/Time  ? RBC 4.08 (L) 01/03/2022 1425  ? MCV 101.5 (H) 01/03/2022 1425  ? MCV 89.2 02/12/2016 1100  ? MCH 33.6 01/03/2022 1425  ? MCHC 33.1 01/03/2022 1425  ? RDW 13.9 01/03/2022 1425  ? RDW 15.1 02/12/2016 1100  ? LYMPHSABS 0.9 01/03/2022 1425  ? MONOABS 0.8 01/03/2022 1425  ? EOSABS 0.1 01/03/2022 1425  ? BASOSABS 0.0 01/03/2022 1425  ? ? ?DIAGNOSTIC IMAGING:  ?I have independently reviewed  the scans and discussed with the patient. ?No results found.  ? ?ASSESSMENT:  ?1.  IgG kappa MGUS: ?-Labs from 05/05/2020 was reviewed.  SPEP showed 0.4 g/dL which is stable. ?-Free light chain ratio increased to

## 2022-01-13 ENCOUNTER — Other Ambulatory Visit: Payer: Self-pay

## 2022-01-13 ENCOUNTER — Encounter: Payer: Self-pay | Admitting: Nurse Practitioner

## 2022-01-13 ENCOUNTER — Telehealth: Payer: Medicare HMO | Admitting: Nurse Practitioner

## 2022-01-13 DIAGNOSIS — I482 Chronic atrial fibrillation, unspecified: Secondary | ICD-10-CM | POA: Diagnosis not present

## 2022-01-13 DIAGNOSIS — E208 Other hypoparathyroidism: Secondary | ICD-10-CM | POA: Diagnosis not present

## 2022-01-13 DIAGNOSIS — E782 Mixed hyperlipidemia: Secondary | ICD-10-CM | POA: Diagnosis not present

## 2022-01-13 DIAGNOSIS — I129 Hypertensive chronic kidney disease with stage 1 through stage 4 chronic kidney disease, or unspecified chronic kidney disease: Secondary | ICD-10-CM | POA: Diagnosis not present

## 2022-01-13 DIAGNOSIS — R5381 Other malaise: Secondary | ICD-10-CM | POA: Diagnosis not present

## 2022-01-13 DIAGNOSIS — N1832 Chronic kidney disease, stage 3b: Secondary | ICD-10-CM | POA: Diagnosis not present

## 2022-01-13 DIAGNOSIS — D1430 Benign neoplasm of unspecified bronchus and lung: Secondary | ICD-10-CM | POA: Diagnosis not present

## 2022-01-13 DIAGNOSIS — Z515 Encounter for palliative care: Secondary | ICD-10-CM | POA: Diagnosis not present

## 2022-01-13 DIAGNOSIS — D472 Monoclonal gammopathy: Secondary | ICD-10-CM | POA: Diagnosis not present

## 2022-01-13 DIAGNOSIS — E875 Hyperkalemia: Secondary | ICD-10-CM | POA: Diagnosis not present

## 2022-01-13 DIAGNOSIS — M79604 Pain in right leg: Secondary | ICD-10-CM | POA: Diagnosis not present

## 2022-01-13 DIAGNOSIS — R630 Anorexia: Secondary | ICD-10-CM | POA: Diagnosis not present

## 2022-01-13 DIAGNOSIS — J449 Chronic obstructive pulmonary disease, unspecified: Secondary | ICD-10-CM | POA: Diagnosis not present

## 2022-01-13 NOTE — Progress Notes (Signed)
? ? ?Manufacturing engineer ?Community Palliative Care Consult Note ?Telephone: [phone]  ?Fax: [phone]  ? ?Date of encounter: 01/13/22 ?4:23 PM ?PATIENT NAME: Ricardo Garrett ?[address] ?[place] [address]   ?[phone] (home)  ?DOB: [birth year] ?MRN: [id number] ?PRIMARY CARE PROVIDER:    ?Ricardo Squibb, MD,  ?[address] ?[place] [address] ?[phone] ? ?REFERRING PROVIDER:   ?Ricardo Squibb, MD ?[address] ?[address] ?[place],  [place] [address] ?[phone] ? ?RESPONSIBLE PARTY:    ?Contact Information   ? ? Name Relation Home Work Mobile  ? Garrett,Ricardo Spouse [phone]    ? Garrett, Ricardo Son [phone]    ? Garrett,Ricardo Daughter [phone]  [phone]  ? ?  ? ?Due to the COVID-19 crisis, this visit was done via telemedicine from my office and it was initiated and consent by this patient and or family. ? ?I connected with Ricardo Garrett, Ricardo Garrett daughter with  Ricardo Garrett OR PROXY on 01/13/22 by phone as video not available enabled telemedicine application and verified that I am speaking with the correct person using two identifiers. ?  ?I discussed the limitations of evaluation and management by telemedicine. The patient expressed understanding and agreed to proceed.  Palliative Care was asked to follow this patient by consultation request of  Ricardo Squibb, MD to address advance care planning and complex medical decision making. This is the initial visit.                         ?ASSESSMENT AND PLAN / RECOMMENDATIONS:  ?Symptom Management/Plan: ?1. Advance Care Planning;  full code, will revisit with MOST form ? ?2. Debility secondary to decompensation; discussed functional abilities, baths, dresses, ambulates walker, drives. We talked about fall risk, exercises. ? ?3. Anorexia; discussed at length with Ricardo Garrett current meals, nutrition, education done;  ? ?06/22/2021 weight 132.1 lbs ?11/04/2021 weight 134 lbs ?01/11/2022 weight 128 lbs ?BMI 19.51 ? ?4. Goals of Care:  Goals include to maximize quality of life and symptom management. Our advance care planning conversation included a discussion about:    ?The value and importance of advance care planning  ?Exploration of personal, cultural or spiritual beliefs that might influence medical decisions  ?Exploration of goals of care in the event of a sudden injury or illness  ?Identification and preparation of a healthcare agent  ?Review and updating or creation of an advance directive document. ? ?5. Palliative care encounter; Palliative care encounter; Palliative medicine team will continue to support patient, patient's family, and medical team. Visit consisted of counseling and education dealing with the complex and emotionally intense issues of symptom management and palliative care in the setting of serious and potentially life-threatening illness ? ?Follow up Palliative Care Visit: Palliative care will continue to follow for complex medical decision making, advance care planning, and clarification of goals. Return 2 weeks with palliative RN then 6 weeks with PC NP or prn. ? ?I spent 34 minutes providing this consultation. More than 50% of the time in this consultation was spent in counseling and care coordination. ?PPS: 50% ? ?Chief Complaint: Initial palliative consult for complex medical decision making ? ?HISTORY OF PRESENT ILLNESS:  Ricardo Garrett is a 76 y.o. year old male  with multiple medical problems including MGUS (monoclonal gammopathy of unknown significance), right lung nodule, COPD, CAD, CHF (EF 35-40%) afib, HTN, HLD, HLD, HOH, arthritis. I called Ricardo Garrett, Ricardo Garrett daughter for initial pc telemedicine telephonic as video  not available consult. Ricardo Garrett, Ricardo Garrett in agreement. We talked about Ricardo. Garrett's the last time Ricardo. Garrett has been independent which he currently still drives. We talked about safety of driving. Ricardo. [place] is ambulatory with walker. He is able to bath, dress. Ricardo Read does some cooking  for him and his wife. We talked about appetite, ros, symptoms, weights. We talked about Ricardo Waldrop resides at home with his wife who is chronically ill herself. We talked about sleep patterns. He quit smoking 30 years ago. We talked about life review, family dynamics. Ricardo Broz smoked 1 pack/day for 20 to 30 years. 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 We talked about last visit with Ricardo Garrett, Hematology for MGUS; right lower lobe lung nodule; with plan CT scan of chest in 6 months, f/u with Ricardo Garrett for increase in creatinine (Creatinine has also increased to 2.41 from 1.8 in March). We talked about medical goals. We talked about role pc in poc. We talked about PC RN f/u in 2 weeks for ongoing assessment, weights, measurements. Ricardo in agreement. Therapeutic listening, emotional support provided. Questions answered. Ricardo in agreement with poc. Contact information provided. ? ?History obtained from review of EMR, discussion with primary team, and interview with family, facility staff/caregiver and/or Ricardo. [place].  ?I reviewed available labs, medications, imaging, studies and related documents from the EMR.  Records reviewed and summarized above.  ? ?ROS ?10 point system reviewed with Ricardo Garrett, Ricardo. Kolodziejski daughter reviewed with negative except HPI ? ?Physical Exam: ?deferred ?CURRENT PROBLEM LIST:  ?Patient Active Problem List  ? Diagnosis Date Noted  ? Monoclonal gammopathy 01/14/2020  ? Tachycardia-bradycardia syndrome ([place])   ? Unstable angina ([place]) 11/23/2017  ? Coronary artery disease involving native coronary artery of native heart with unstable angina pectoris ([place])   ? Acute systolic CHF (congestive heart failure) ([place]) 01/21/2016  ? Pleural effusion on right   ? Atrial fibrillation with rapid ventricular response ([place]) 01/20/2016  ? Anxiety disorder 01/19/2016  ? COPD (chronic obstructive pulmonary disease) ([place]) 01/19/2016  ? Anasarca 01/19/2016  ? CKD (chronic kidney disease) stage 3,  GFR 30-59 ml/min (HCC) 01/19/2016  ? Hypoalbuminemia 01/19/2016  ? Hip pain 04/29/2013  ? Difficulty in walking(719.7) 04/29/2013  ? Dermatitis 03/26/2013  ? Other and unspecified hyperlipidemia 03/19/2013  ? Unspecified constipation 03/19/2013  ? Arrhythmia 03/19/2013  ? Hyponatremia 03/19/2013  ? Anticoagulated 03/19/2013  ? Acute blood loss anemia 03/16/2013  ? Intertrochanteric fracture of right hip ([place]) 03/14/2013  ? ?PAST MEDICAL HISTORY:  ?Active Ambulatory Problems  ?  Diagnosis Date Noted  ? Intertrochanteric fracture of right hip ([place]) 03/14/2013  ? Acute blood loss anemia 03/16/2013  ? Other and unspecified hyperlipidemia 03/19/2013  ? Unspecified constipation 03/19/2013  ? Arrhythmia 03/19/2013  ? Hyponatremia 03/19/2013  ? Anticoagulated 03/19/2013  ? Dermatitis 03/26/2013  ? Hip pain 04/29/2013  ? Difficulty in walking(719.7) 04/29/2013  ? Anxiety disorder 01/19/2016  ? COPD (chronic obstructive pulmonary disease) ([place]) 01/19/2016  ? Anasarca 01/19/2016  ? CKD (chronic kidney disease) stage 3, GFR 30-59 ml/min (HCC) 01/19/2016  ? Hypoalbuminemia 01/19/2016  ? Atrial fibrillation with rapid ventricular response ([place]) 01/20/2016  ? Acute systolic CHF (congestive heart failure) ([place]) 01/21/2016  ? Pleural effusion on right   ? Coronary artery disease involving native coronary artery of native heart with unstable angina pectoris ([place])   ? Unstable angina ([place]) 11/23/2017  ? Tachycardia-bradycardia syndrome ([employer])   ? Monoclonal gammopathy 01/14/2020  ? ?Resolved Ambulatory Problems  ?  Diagnosis Date Noted  ? No Resolved Ambulatory Problems  ? ?Past Medical History:  ?Diagnosis Date  ? Arthritis   ? Broken arm ~ 1952  ? CAD (coronary artery disease)   ? CHF (congestive heart failure) ([place])   ? High cholesterol   ? HOH (hard of hearing)   ? Hypertension   ? Persistent atrial fibrillation ([place])   ? ?SOCIAL HX:  ?Social History  ? ?Tobacco Use  ? Smoking status: Former  ?  Packs/day: 1.00  ?  Years: 20.00  ?   Pack years: 20.00  ?  Types: Cigarettes  ?  Quit date: 11/01/1979  ?  Years since quitting: 42.2  ? Smokeless tobacco: Never  ?Substance Use Topics  ? Alcohol use: Yes  ?  Comment: 11/23/2017 "beer once

## 2022-01-14 ENCOUNTER — Ambulatory Visit (INDEPENDENT_AMBULATORY_CARE_PROVIDER_SITE_OTHER): Payer: Medicare HMO

## 2022-01-14 DIAGNOSIS — I495 Sick sinus syndrome: Secondary | ICD-10-CM

## 2022-01-17 LAB — CUP PACEART REMOTE DEVICE CHECK
Battery Remaining Longevity: 79 mo
Battery Remaining Percentage: 64 %
Battery Voltage: 3.01 V
Brady Statistic RV Percent Paced: 41 %
Date Time Interrogation Session: 20230317161149
Implantable Lead Implant Date: 20190128
Implantable Lead Implant Date: 20190128
Implantable Lead Location: 753859
Implantable Lead Location: 753860
Implantable Pulse Generator Implant Date: 20190128
Lead Channel Impedance Value: 510 Ohm
Lead Channel Pacing Threshold Amplitude: 0.75 V
Lead Channel Pacing Threshold Pulse Width: 0.5 ms
Lead Channel Sensing Intrinsic Amplitude: 12 mV
Lead Channel Setting Pacing Amplitude: 2.5 V
Lead Channel Setting Pacing Pulse Width: 0.5 ms
Lead Channel Setting Sensing Sensitivity: 2 mV
Pulse Gen Model: 2272
Pulse Gen Serial Number: 8982069

## 2022-01-21 NOTE — Progress Notes (Signed)
Remote pacemaker transmission.   

## 2022-01-28 DIAGNOSIS — E785 Hyperlipidemia, unspecified: Secondary | ICD-10-CM | POA: Diagnosis not present

## 2022-01-28 DIAGNOSIS — I10 Essential (primary) hypertension: Secondary | ICD-10-CM | POA: Diagnosis not present

## 2022-02-02 DIAGNOSIS — D472 Monoclonal gammopathy: Secondary | ICD-10-CM | POA: Diagnosis not present

## 2022-02-02 DIAGNOSIS — I5022 Chronic systolic (congestive) heart failure: Secondary | ICD-10-CM | POA: Diagnosis not present

## 2022-02-02 DIAGNOSIS — E871 Hypo-osmolality and hyponatremia: Secondary | ICD-10-CM | POA: Diagnosis not present

## 2022-02-02 DIAGNOSIS — R718 Other abnormality of red blood cells: Secondary | ICD-10-CM | POA: Diagnosis not present

## 2022-02-02 DIAGNOSIS — N1832 Chronic kidney disease, stage 3b: Secondary | ICD-10-CM | POA: Diagnosis not present

## 2022-02-02 DIAGNOSIS — E790 Hyperuricemia without signs of inflammatory arthritis and tophaceous disease: Secondary | ICD-10-CM | POA: Diagnosis not present

## 2022-02-02 DIAGNOSIS — I129 Hypertensive chronic kidney disease with stage 1 through stage 4 chronic kidney disease, or unspecified chronic kidney disease: Secondary | ICD-10-CM | POA: Diagnosis not present

## 2022-02-02 DIAGNOSIS — R809 Proteinuria, unspecified: Secondary | ICD-10-CM | POA: Diagnosis not present

## 2022-02-27 DIAGNOSIS — I129 Hypertensive chronic kidney disease with stage 1 through stage 4 chronic kidney disease, or unspecified chronic kidney disease: Secondary | ICD-10-CM | POA: Diagnosis not present

## 2022-02-27 DIAGNOSIS — E1165 Type 2 diabetes mellitus with hyperglycemia: Secondary | ICD-10-CM | POA: Diagnosis not present

## 2022-02-27 DIAGNOSIS — E782 Mixed hyperlipidemia: Secondary | ICD-10-CM | POA: Diagnosis not present

## 2022-02-27 DIAGNOSIS — N1832 Chronic kidney disease, stage 3b: Secondary | ICD-10-CM | POA: Diagnosis not present

## 2022-03-30 DIAGNOSIS — N1832 Chronic kidney disease, stage 3b: Secondary | ICD-10-CM | POA: Diagnosis not present

## 2022-03-30 DIAGNOSIS — E1165 Type 2 diabetes mellitus with hyperglycemia: Secondary | ICD-10-CM | POA: Diagnosis not present

## 2022-03-30 DIAGNOSIS — E782 Mixed hyperlipidemia: Secondary | ICD-10-CM | POA: Diagnosis not present

## 2022-03-30 DIAGNOSIS — I129 Hypertensive chronic kidney disease with stage 1 through stage 4 chronic kidney disease, or unspecified chronic kidney disease: Secondary | ICD-10-CM | POA: Diagnosis not present

## 2022-04-11 ENCOUNTER — Telehealth: Payer: Self-pay

## 2022-04-11 NOTE — Telephone Encounter (Signed)
PC SW outreached patient to schedule PC in home visit.  Call unsuccessful. SW LVM with contact information.

## 2022-04-15 ENCOUNTER — Ambulatory Visit (INDEPENDENT_AMBULATORY_CARE_PROVIDER_SITE_OTHER): Payer: Medicare HMO

## 2022-04-15 DIAGNOSIS — I495 Sick sinus syndrome: Secondary | ICD-10-CM | POA: Diagnosis not present

## 2022-04-16 LAB — CUP PACEART REMOTE DEVICE CHECK
Battery Remaining Longevity: 78 mo
Battery Remaining Percentage: 62 %
Battery Voltage: 3.01 V
Brady Statistic RV Percent Paced: 47 %
Date Time Interrogation Session: 20230616051740
Implantable Lead Implant Date: 20190128
Implantable Lead Implant Date: 20190128
Implantable Lead Location: 753859
Implantable Lead Location: 753860
Implantable Pulse Generator Implant Date: 20190128
Lead Channel Impedance Value: 540 Ohm
Lead Channel Pacing Threshold Amplitude: 0.75 V
Lead Channel Pacing Threshold Pulse Width: 0.5 ms
Lead Channel Sensing Intrinsic Amplitude: 12 mV
Lead Channel Setting Pacing Amplitude: 2.5 V
Lead Channel Setting Pacing Pulse Width: 0.5 ms
Lead Channel Setting Sensing Sensitivity: 2 mV
Pulse Gen Model: 2272
Pulse Gen Serial Number: 8982069

## 2022-04-21 NOTE — Progress Notes (Signed)
Remote pacemaker transmission.   

## 2022-04-29 DIAGNOSIS — E782 Mixed hyperlipidemia: Secondary | ICD-10-CM | POA: Diagnosis not present

## 2022-04-29 DIAGNOSIS — E1165 Type 2 diabetes mellitus with hyperglycemia: Secondary | ICD-10-CM | POA: Diagnosis not present

## 2022-04-29 DIAGNOSIS — N1832 Chronic kidney disease, stage 3b: Secondary | ICD-10-CM | POA: Diagnosis not present

## 2022-04-29 DIAGNOSIS — I129 Hypertensive chronic kidney disease with stage 1 through stage 4 chronic kidney disease, or unspecified chronic kidney disease: Secondary | ICD-10-CM | POA: Diagnosis not present

## 2022-05-04 DIAGNOSIS — N1832 Chronic kidney disease, stage 3b: Secondary | ICD-10-CM | POA: Diagnosis not present

## 2022-05-04 DIAGNOSIS — I5022 Chronic systolic (congestive) heart failure: Secondary | ICD-10-CM | POA: Diagnosis not present

## 2022-05-04 DIAGNOSIS — R809 Proteinuria, unspecified: Secondary | ICD-10-CM | POA: Diagnosis not present

## 2022-05-04 DIAGNOSIS — I129 Hypertensive chronic kidney disease with stage 1 through stage 4 chronic kidney disease, or unspecified chronic kidney disease: Secondary | ICD-10-CM | POA: Diagnosis not present

## 2022-05-04 DIAGNOSIS — D472 Monoclonal gammopathy: Secondary | ICD-10-CM | POA: Diagnosis not present

## 2022-05-04 DIAGNOSIS — R718 Other abnormality of red blood cells: Secondary | ICD-10-CM | POA: Diagnosis not present

## 2022-05-04 DIAGNOSIS — E871 Hypo-osmolality and hyponatremia: Secondary | ICD-10-CM | POA: Diagnosis not present

## 2022-05-11 ENCOUNTER — Telehealth: Payer: Self-pay

## 2022-05-11 DIAGNOSIS — E211 Secondary hyperparathyroidism, not elsewhere classified: Secondary | ICD-10-CM | POA: Diagnosis not present

## 2022-05-11 DIAGNOSIS — I129 Hypertensive chronic kidney disease with stage 1 through stage 4 chronic kidney disease, or unspecified chronic kidney disease: Secondary | ICD-10-CM | POA: Diagnosis not present

## 2022-05-11 DIAGNOSIS — N1832 Chronic kidney disease, stage 3b: Secondary | ICD-10-CM | POA: Diagnosis not present

## 2022-05-11 DIAGNOSIS — I5022 Chronic systolic (congestive) heart failure: Secondary | ICD-10-CM | POA: Diagnosis not present

## 2022-05-11 DIAGNOSIS — D472 Monoclonal gammopathy: Secondary | ICD-10-CM | POA: Diagnosis not present

## 2022-05-11 DIAGNOSIS — R809 Proteinuria, unspecified: Secondary | ICD-10-CM | POA: Diagnosis not present

## 2022-05-11 DIAGNOSIS — E559 Vitamin D deficiency, unspecified: Secondary | ICD-10-CM | POA: Diagnosis not present

## 2022-05-11 NOTE — Telephone Encounter (Signed)
PC SW returned patients daughter TC in reference to scheduling a home visit for both patient and his spouse.  Call unsuccessful. SW LVM with contact information.

## 2022-05-20 NOTE — Progress Notes (Unsigned)
Date:  05/25/2022   ID:  Ricardo Garrett, DOB 1946/10/14, MRN [id number]  Provider Location: Office  PCP:  [name], MD  Cardiologist:  [name], MD   Electrophysiologist:  [name], MD   Evaluation Performed:  Follow-Up Visit  Chief Complaint:  Pacer/ Afib  History of Present Illness:    76 y.o. with ischemic DCM, HTN, HLD, PPM. Had abnormal stress test and set up for cath 11/23/17 with orbital atherectomy and stenting of mid circumflex with DES and PCI small OM branch done by Dr Veronda Prude. 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 12/2015: echo showing reduced EF of 35-40% b. NST: 09/2017: scar with peri-infarct ischemia, intermediate-risk study   COPD (chronic obstructive pulmonary disease) ([place])    "was on RX when he had CHF; not on anything now" (11/23/2017)   High cholesterol    HOH (hard of hearing)    Hypertension    Persistent atrial fibrillation ([place])    a. s/p DCCV in 03/2016   Past Surgical History:  Procedure Laterality Date   CARDIAC CATHETERIZATION  11/23/2017   CARDIOVERSION N/A 04/05/2016   Procedure: CARDIOVERSION;  Surgeon: Josue Hector, MD;  Location: [facility];  Service: Cardiovascular;  Laterality: N/A;   COMPRESSION HIP SCREW Right 03/15/2013   Procedure:  COMPRESSION HIP;  Surgeon: Sanjuana Kava, MD;  Location: [facility] ORS;  Service: Orthopedics;  Laterality: Right;   CORONARY ATHERECTOMY N/A 11/24/2017   Procedure: CORONARY ATHERECTOMY;  Surgeon: Burnell Blanks, MD;  Location: [facility] LAB;  Service: Cardiovascular;  Laterality: N/A;   CORONARY BALLOON ANGIOPLASTY N/A 11/24/2017   Procedure: CORONARY BALLOON ANGIOPLASTY;  Surgeon: Burnell Blanks, MD;  Location: [facility];  Service: Cardiovascular;  Laterality: N/A;   CORONARY STENT INTERVENTION N/A 11/24/2017   Procedure: CORONARY STENT INTERVENTION;  Surgeon: Burnell Blanks, MD;  Location: [facility];  Service: Cardiovascular;  Laterality: N/A;   FRACTURE SURGERY     PACEMAKER IMPLANT N/A 11/27/2017   Procedure: PACEMAKER IMPLANT;  Surgeon: Evans Lance, MD;  Location: [facility] LAB;  Service: Cardiovascular;  Laterality: N/A;   RIGHT/LEFT HEART CATH AND CORONARY ANGIOGRAPHY N/A 11/23/2017   Procedure: RIGHT/LEFT HEART CATH AND CORONARY ANGIOGRAPHY;  Surgeon: Burnell Blanks, MD;  Location: [facility] LAB;  Service: Cardiovascular;  Laterality: N/A;   SKIN GRAFT Left    from right thigh   TONSILLECTOMY       Current Meds  Medication Sig   amLODipine (NORVASC) 5 MG tablet Take 5 mg by mouth daily.   apixaban (ELIQUIS) 2.5 MG TABS tablet Take 1 tablet (2.5 mg total) by mouth 2 (two)  times daily.   atorvastatin (LIPITOR) 80 MG tablet Take 1 tablet (80 mg total) by mouth daily.   clopidogrel (PLAVIX) 75 MG tablet Take 1 tablet by mouth once daily   diclofenac Sodium (VOLTAREN) 1 % GEL diclofenac 1 % topical gel  APPLY 2 GRAMS TO THE AFFECTED AREA 4 TIMES PER DAY   digoxin (LANOXIN) 0.125 MG tablet Take 1 tablet (125 mcg total) by mouth daily.   furosemide (LASIX) 40 MG tablet Take 1 tablet (40 mg total) by mouth daily.   HYDROcodone-acetaminophen (NORCO) 7.5-325 MG tablet hydrocodone 7.5 mg-acetaminophen 325 mg tablet  TAKE ONE  TABLET BY MOUTH twice daily   lisinopril (ZESTRIL) 40 MG tablet Take 40 mg by mouth daily.   LOKELMA 10 g PACK packet Take 1 packet by mouth daily.   losartan (COZAAR) 50 MG tablet Take 1 tablet by mouth daily.   metoprolol succinate (TOPROL-XL) 25 MG 24 hr tablet TAKE 1/2 (ONE-HALF) TABLET BY MOUTH IN THE MORNING AND 1 TABLET IN THE EVENING   tamsulosin (FLOMAX) 0.4 MG CAPS capsule Take 0.4 mg by mouth daily.   triamcinolone cream (KENALOG) 0.1 % SMARTSIG:Sparingly Topical Twice Daily     Allergies:   Patient has no known allergies.   Social History   Tobacco Use   Smoking status: Former    Packs/day: 1.00    Years: 20.00    Total pack years: 20.00    Types: Cigarettes    Quit date: 11/01/1979    Years since quitting: 42.5   Smokeless tobacco: Never  Vaping Use   Vaping Use: Never used  Substance Use Topics   Alcohol use: Yes    Comment: 11/23/2017 "beer once/month maybe"   Drug use: Yes    Types: Marijuana    Comment: 11/23/2017 "nothing for years"     Family Hx: The patient's family history includes Diabetes in his mother; Heart attack in his father.  ROS:   Please see the history of present illness.     All other systems reviewed and are negative.   Prior CV studies:   The following studies were reviewed today:  PACEART 12/25/18 Echo 11/25/17 EF 50-55%  Labs/Other Tests and Data Reviewed:    EKG:   07/20/18  afib rate 68 intermittent V pacing   Recent Labs: 01/03/2022: ALT 16; BUN 34; Creatinine, Ser 1.71; Hemoglobin 13.7; Platelets 243; Potassium 4.7; Sodium 133   Recent Lipid Panel Lab Results  Component Value Date/Time   CHOL 193 02/12/2016 12:00 AM   TRIG 196 02/12/2016 12:00 AM   HDL 29 02/12/2016 12:00 AM   CHOLHDL 6.7 02/12/2016 12:00 AM   LDLCALC 125 02/12/2016 12:00 AM    Wt Readings from Last 3 Encounters:  01/11/22 128 lb 4.8 oz (58.2 kg)  11/04/21 134 lb 9.6 oz (61.1 kg)  06/22/21 132 lb 11.5 oz (60.2 kg)     Objective:    Vital Signs:   There were no vitals taken for this visit.   Affect appropriate Chronically ill elderly white male  HEENT: very hard of hearing  Neck supple with no adenopathy JVP normal no bruits no thyromegaly Lungs rhonchi / exp wheezing  Heart:  S1/S2 no murmur, no rub, gallop or click PMI normal pacer under left clavicle  Abdomen: benighn, BS positve, no tenderness, no AAA no bruit.  No HSM or HJR Distal pulses intact with no bruits No edema Neuro non-focal Skin warm and dry No muscular weakness   ASSESSMENT & PLAN:  1. Chronic Combined Systolic and Diastolic CHF:  Euvolemic EF normalized by TTE 11/03/20 continue medical Rx 2. Persistent Atrial Fibrillation  Failed [facility] 03/2016 continue eliquis . Rate control good    3. HTN:  Well controlled.  Continue current medications and low sodium Dash type diet.     4. HLD: on statin labs with primary LDL 62    5. Pacer:  Normal function by PaceArt f/u Dr Lovena Le    6. Ortho:  F/u Dr Luna Glasgow for x-rays and consider referral back to PT/OT   7. CAD: post stenting mid circumflex 11/23/17 On plavix and eliquis no ASA No angina stable   8. COPD:  History of smoking lung cancer screening CT 06/18/21 9 mm RLL nodule F/u CT at discretion of primary has quit   9. CRF:  Cr 1.7 at baseline  f/u Dr Theador Hawthorne   10. Oncology:  IgG kappa MGUS negative bone survey myeloma panel per DR Delton Coombes light chain and M spike are stable No "crab" features   11.  ETOH:  counseled on moderation and risk of cirrhosis F/U primary for LFTls   COVID-19 Education: The signs and symptoms of COVID-19 were discussed with the patient and how to seek care for testing (follow up with PCP or arrange E-visit).  The importance of social distancing was discussed today.    Medication Adjustments/Labs and Tests Ordered: Current medicines are reviewed at length with the patient today.  Concerns regarding medicines are outlined above.   Tests Ordered:  None   Medication Changes: No  orders of the defined types were placed in this encounter.    Disposition:  Follow up Lovena Le 6 months for PPM and me in a year   Signed, [name], MD  05/25/2022 10:45 AM    [place]

## 2022-05-24 DIAGNOSIS — R7303 Prediabetes: Secondary | ICD-10-CM | POA: Diagnosis not present

## 2022-05-24 DIAGNOSIS — M1A9XX1 Chronic gout, unspecified, with tophus (tophi): Secondary | ICD-10-CM | POA: Diagnosis not present

## 2022-05-24 DIAGNOSIS — E782 Mixed hyperlipidemia: Secondary | ICD-10-CM | POA: Diagnosis not present

## 2022-05-25 ENCOUNTER — Encounter: Payer: Self-pay | Admitting: Cardiovascular Disease

## 2022-05-25 ENCOUNTER — Ambulatory Visit: Payer: Medicare HMO | Admitting: Cardiovascular Disease

## 2022-05-25 VITALS — BP 86/60 | HR 62 | Ht 67.0 in | Wt 125.2 lb

## 2022-05-25 DIAGNOSIS — Z950 Presence of cardiac pacemaker: Secondary | ICD-10-CM

## 2022-05-25 DIAGNOSIS — I5042 Chronic combined systolic (congestive) and diastolic (congestive) heart failure: Secondary | ICD-10-CM

## 2022-05-25 DIAGNOSIS — F1010 Alcohol abuse, uncomplicated: Secondary | ICD-10-CM

## 2022-05-25 DIAGNOSIS — R69 Illness, unspecified: Secondary | ICD-10-CM | POA: Diagnosis not present

## 2022-05-25 DIAGNOSIS — I4819 Other persistent atrial fibrillation: Secondary | ICD-10-CM | POA: Diagnosis not present

## 2022-05-25 DIAGNOSIS — I2510 Atherosclerotic heart disease of native coronary artery without angina pectoris: Secondary | ICD-10-CM | POA: Diagnosis not present

## 2022-05-25 DIAGNOSIS — F17200 Nicotine dependence, unspecified, uncomplicated: Secondary | ICD-10-CM

## 2022-05-25 NOTE — Patient Instructions (Signed)
Medication Instructions:  ?Your physician recommends that you continue on your current medications as directed. Please refer to the Current Medication list given to you today. ? ?*If you need a refill on your cardiac medications before your next appointment, please call your pharmacy* ? ? ?Lab Work: ?NONE  ? ?If you have labs (blood work) drawn today and your tests are completely normal, you will receive your results only by: ?MyChart Message (if you have MyChart) OR ?A paper copy in the mail ?If you have any lab test that is abnormal or we need to change your treatment, we will call you to review the results. ? ? ?Testing/Procedures: ?NONE  ? ? ?Follow-Up: ?At [facility], you and your health needs are our priority.  As part of our continuing mission to provide you with exceptional heart care, we have created designated Provider Care Teams.  These Care Teams include your primary Cardiologist (physician) and Advanced Practice Providers (APPs -  Physician Assistants and Nurse Practitioners) who all work together to provide you with the care you need, when you need it. ? ?We recommend signing up for the patient portal called "MyChart".  Sign up information is provided on this After Visit Summary.  MyChart is used to connect with patients for Virtual Visits (Telemedicine).  Patients are able to view lab/test results, encounter notes, upcoming appointments, etc.  Non-urgent messages can be sent to your provider as well.   ?To learn more about what you can do with MyChart, go to [email or web address].com.   ? ?Your next appointment:   ?1 year(s) ? ?The format for your next appointment:   ?In Person ? ?Provider:   ?Peter Nishan, MD  ? ? ?Other Instructions ?Thank you for choosing [facility]! ? ? ? ?Important Information About Sugar ? ? ? ? ? ? ?

## 2022-05-31 DIAGNOSIS — I482 Chronic atrial fibrillation, unspecified: Secondary | ICD-10-CM | POA: Diagnosis not present

## 2022-05-31 DIAGNOSIS — I5042 Chronic combined systolic (congestive) and diastolic (congestive) heart failure: Secondary | ICD-10-CM | POA: Diagnosis not present

## 2022-05-31 DIAGNOSIS — D472 Monoclonal gammopathy: Secondary | ICD-10-CM | POA: Diagnosis not present

## 2022-05-31 DIAGNOSIS — D1430 Benign neoplasm of unspecified bronchus and lung: Secondary | ICD-10-CM | POA: Diagnosis not present

## 2022-05-31 DIAGNOSIS — N1832 Chronic kidney disease, stage 3b: Secondary | ICD-10-CM | POA: Diagnosis not present

## 2022-05-31 DIAGNOSIS — J449 Chronic obstructive pulmonary disease, unspecified: Secondary | ICD-10-CM | POA: Diagnosis not present

## 2022-05-31 DIAGNOSIS — E782 Mixed hyperlipidemia: Secondary | ICD-10-CM | POA: Diagnosis not present

## 2022-05-31 DIAGNOSIS — E875 Hyperkalemia: Secondary | ICD-10-CM | POA: Diagnosis not present

## 2022-05-31 DIAGNOSIS — M79604 Pain in right leg: Secondary | ICD-10-CM | POA: Diagnosis not present

## 2022-05-31 DIAGNOSIS — R7303 Prediabetes: Secondary | ICD-10-CM | POA: Diagnosis not present

## 2022-05-31 DIAGNOSIS — I129 Hypertensive chronic kidney disease with stage 1 through stage 4 chronic kidney disease, or unspecified chronic kidney disease: Secondary | ICD-10-CM | POA: Diagnosis not present

## 2022-05-31 DIAGNOSIS — E208 Other hypoparathyroidism: Secondary | ICD-10-CM | POA: Diagnosis not present

## 2022-07-06 DIAGNOSIS — F418 Other specified anxiety disorders: Secondary | ICD-10-CM | POA: Diagnosis not present

## 2022-07-06 DIAGNOSIS — Z23 Encounter for immunization: Secondary | ICD-10-CM | POA: Diagnosis not present

## 2022-07-06 DIAGNOSIS — R69 Illness, unspecified: Secondary | ICD-10-CM | POA: Diagnosis not present

## 2022-07-06 DIAGNOSIS — E440 Moderate protein-calorie malnutrition: Secondary | ICD-10-CM | POA: Diagnosis not present

## 2022-07-15 ENCOUNTER — Ambulatory Visit (INDEPENDENT_AMBULATORY_CARE_PROVIDER_SITE_OTHER): Payer: Medicare HMO

## 2022-07-15 DIAGNOSIS — I495 Sick sinus syndrome: Secondary | ICD-10-CM | POA: Diagnosis not present

## 2022-07-16 LAB — CUP PACEART REMOTE DEVICE CHECK
Battery Remaining Longevity: 76 mo
Battery Remaining Percentage: 60 %
Battery Voltage: 3.01 V
Brady Statistic RV Percent Paced: 50 %
Date Time Interrogation Session: 20230915020014
Implantable Lead Implant Date: 20190128
Implantable Lead Implant Date: 20190128
Implantable Lead Location: 753859
Implantable Lead Location: 753860
Implantable Pulse Generator Implant Date: 20190128
Lead Channel Impedance Value: 540 Ohm
Lead Channel Pacing Threshold Amplitude: 0.75 V
Lead Channel Pacing Threshold Pulse Width: 0.5 ms
Lead Channel Sensing Intrinsic Amplitude: 11.5 mV
Lead Channel Setting Pacing Amplitude: 2.5 V
Lead Channel Setting Pacing Pulse Width: 0.5 ms
Lead Channel Setting Sensing Sensitivity: 2 mV
Pulse Gen Model: 2272
Pulse Gen Serial Number: 8982069

## 2022-07-26 ENCOUNTER — Other Ambulatory Visit (HOSPITAL_COMMUNITY): Payer: Medicare HMO

## 2022-07-26 NOTE — Progress Notes (Signed)
Remote pacemaker transmission.   

## 2022-07-27 ENCOUNTER — Ambulatory Visit (HOSPITAL_COMMUNITY)
Admission: RE | Admit: 2022-07-27 | Discharge: 2022-07-27 | Disposition: A | Discharge status: Home / Self Care | Payer: Medicare HMO | Source: Ambulatory Visit | Attending: Hematology | Admitting: Hematology

## 2022-07-27 ENCOUNTER — Inpatient Hospital Stay: Payer: Medicare HMO | Attending: Hematology

## 2022-07-27 DIAGNOSIS — D472 Monoclonal gammopathy: Secondary | ICD-10-CM | POA: Insufficient documentation

## 2022-07-27 DIAGNOSIS — J439 Emphysema, unspecified: Secondary | ICD-10-CM | POA: Diagnosis not present

## 2022-07-27 DIAGNOSIS — R918 Other nonspecific abnormal finding of lung field: Secondary | ICD-10-CM | POA: Insufficient documentation

## 2022-07-27 LAB — CBC WITH DIFFERENTIAL/PLATELET
Abs Immature Granulocytes: 0.04 10*3/uL (ref 0.00–0.07)
Basophils Absolute: 0 10*3/uL (ref 0.0–0.1)
Basophils Relative: 1 %
Eosinophils Absolute: 0.1 10*3/uL (ref 0.0–0.5)
Eosinophils Relative: 1 %
HCT: 36.2 % — ABNORMAL LOW (ref 39.0–52.0)
Hemoglobin: 12.2 g/dL — ABNORMAL LOW (ref 13.0–17.0)
Immature Granulocytes: 1 %
Lymphocytes Relative: 11 %
Lymphs Abs: 0.7 10*3/uL (ref 0.7–4.0)
MCH: 33.6 pg (ref 26.0–34.0)
MCHC: 33.7 g/dL (ref 30.0–36.0)
MCV: 99.7 fL (ref 80.0–100.0)
Monocytes Absolute: 0.9 10*3/uL (ref 0.1–1.0)
Monocytes Relative: 15 %
Neutro Abs: 4.3 10*3/uL (ref 1.7–7.7)
Neutrophils Relative %: 71 %
Platelets: 210 10*3/uL (ref 150–400)
RBC: 3.63 MIL/uL — ABNORMAL LOW (ref 4.22–5.81)
RDW: 13.7 % (ref 11.5–15.5)
WBC: 6 10*3/uL (ref 4.0–10.5)
nRBC: 0 % (ref 0.0–0.2)

## 2022-07-27 LAB — COMPREHENSIVE METABOLIC PANEL
ALT: 15 U/L (ref 0–44)
AST: 19 U/L (ref 15–41)
Albumin: 3.2 g/dL — ABNORMAL LOW (ref 3.5–5.0)
Alkaline Phosphatase: 90 U/L (ref 38–126)
Anion gap: 6 (ref 5–15)
BUN: 27 mg/dL — ABNORMAL HIGH (ref 8–23)
CO2: 24 mmol/L (ref 22–32)
Calcium: 8.4 mg/dL — ABNORMAL LOW (ref 8.9–10.3)
Chloride: 104 mmol/L (ref 98–111)
Creatinine, Ser: 1.52 mg/dL — ABNORMAL HIGH (ref 0.61–1.24)
GFR, Estimated: 47 mL/min — ABNORMAL LOW (ref >60)
Glucose, Bld: 108 mg/dL — ABNORMAL HIGH (ref 70–99)
Potassium: 4.4 mmol/L (ref 3.5–5.1)
Sodium: 134 mmol/L — ABNORMAL LOW (ref 135–145)
Total Bilirubin: 1 mg/dL (ref 0.3–1.2)
Total Protein: 5.9 g/dL — ABNORMAL LOW (ref 6.5–8.1)

## 2022-07-27 LAB — LACTATE DEHYDROGENASE: LDH: 134 U/L (ref 98–192)

## 2022-07-28 LAB — KAPPA/LAMBDA LIGHT CHAINS
Kappa free light chain: 57.9 mg/L — ABNORMAL HIGH (ref 3.3–19.4)
Kappa, lambda light chain ratio: 3.49 — ABNORMAL HIGH (ref 0.26–1.65)
Lambda free light chains: 16.6 mg/L (ref 5.7–26.3)

## 2022-07-29 LAB — PROTEIN ELECTROPHORESIS, SERUM
A/G Ratio: 1.1 (ref 0.7–1.7)
Albumin ELP: 2.8 g/dL — ABNORMAL LOW (ref 2.9–4.4)
Alpha-1-Globulin: 0.2 g/dL (ref 0.0–0.4)
Alpha-2-Globulin: 0.6 g/dL (ref 0.4–1.0)
Beta Globulin: 0.6 g/dL — ABNORMAL LOW (ref 0.7–1.3)
Gamma Globulin: 1.1 g/dL (ref 0.4–1.8)
Globulin, Total: 2.6 g/dL (ref 2.2–3.9)
M-Spike, %: 0.7 g/dL — ABNORMAL HIGH
Total Protein ELP: 5.4 g/dL — ABNORMAL LOW (ref 6.0–8.5)

## 2022-08-02 ENCOUNTER — Encounter: Payer: Self-pay | Admitting: Hematology

## 2022-08-02 ENCOUNTER — Inpatient Hospital Stay: Payer: Medicare HMO | Attending: Hematology | Admitting: Hematology

## 2022-08-02 VITALS — BP 107/64 | HR 59 | Temp 97.4°F | Resp 18 | Ht 68.0 in | Wt 124.6 lb

## 2022-08-02 DIAGNOSIS — N189 Chronic kidney disease, unspecified: Secondary | ICD-10-CM | POA: Diagnosis not present

## 2022-08-02 DIAGNOSIS — Z87891 Personal history of nicotine dependence: Secondary | ICD-10-CM | POA: Insufficient documentation

## 2022-08-02 DIAGNOSIS — D472 Monoclonal gammopathy: Secondary | ICD-10-CM | POA: Insufficient documentation

## 2022-08-02 DIAGNOSIS — R911 Solitary pulmonary nodule: Secondary | ICD-10-CM | POA: Diagnosis not present

## 2022-08-02 NOTE — Patient Instructions (Addendum)
[place] at [facility] Discharge Instructions   You were seen and examined today by Dr. Delton Coombes.  He reviewed the results of your CT scan which is normal. No further follow up on this is required.   He reviewed the results of your lab work. All results are normal/stable.   Return as scheduled in 6 months.    Thank you for choosing [place] at [facility] to provide your oncology and hematology care.  To afford each patient quality time with our provider, please arrive at least 15 minutes before your scheduled appointment time.   If you have a lab appointment with the [facility] please come in thru the Main Entrance and check in at the main information desk.  You need to re-schedule your appointment should you arrive 10 or more minutes late.  We strive to give you quality time with our providers, and arriving late affects you and other patients whose appointments are after yours.  Also, if you no show three or more times for appointments you may be dismissed from the clinic at the providers discretion.     Again, thank you for choosing [facility].  Our hope is that these requests will decrease the amount of time that you wait before being seen by our physicians.       _____________________________________________________________  Should you have questions after your visit to [facility], please contact our office at [phone] and follow the prompts.  Our office hours are 8:00 a.m. and 4:30 p.m. Monday - Friday.  Please note that voicemails left after 4:00 p.m. may not be returned until the following business day.  We are closed weekends and major holidays.  You do have access to a nurse 24-7, just call the main number to the clinic [phone] and do not press any options, hold on the line and a nurse will answer the phone.    For prescription refill requests, have your pharmacy contact our office and  allow 72 hours.    Due to Covid, you will need to wear a mask upon entering the hospital. If you do not have a mask, a mask will be given to you at the Main Entrance upon arrival. For doctor visits, patients may have 1 support person age 45 or older with them. For treatment visits, patients can not have anyone with them due to social distancing guidelines and our immunocompromised population.

## 2022-08-02 NOTE — Progress Notes (Signed)
[place], [place] [address]   CLINIC:  Medical Oncology/Hematology  PCP:  [name], MD [address] 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, a 76 y.o. male, seen for follow-up of MGUS and right lung nodule.  He does not report any fevers or infections in the last 6 months.  Denies any new onset numbness or tingling.  Denies any new bone pains.  He is accompanied by his daughter today.  He is hard at hearing.  REVIEW OF SYSTEMS:  Review of Systems  Constitutional:  Negative for appetite change and fatigue.  All other systems reviewed and are negative.   PAST MEDICAL/SURGICAL HISTORY:  Past Medical History:  Diagnosis Date   Arthritis    "finger on right hand" (11/23/2017)   Broken arm ~ 1952   "no OR; just reset it; not sure which side"   CAD (coronary artery disease)    CHF (congestive heart failure) ([facility])    a. 12/2015: echo showing reduced EF of 35-40% b. NST: 09/2017: scar with peri-infarct ischemia, intermediate-risk study   COPD (chronic obstructive pulmonary disease) ([facility])    "was on RX when he had CHF; not on anything now" (11/23/2017)   High cholesterol    HOH (hard of hearing)    Hypertension    Persistent atrial fibrillation ([facility])    a. s/p DCCV in 03/2016   Past Surgical History:  Procedure Laterality Date   CARDIAC CATHETERIZATION  11/23/2017   CARDIOVERSION N/A 04/05/2016   Procedure: CARDIOVERSION;  Surgeon: Josue Hector, MD;  Location: [facility];  Service: Cardiovascular;  Laterality: N/A;   COMPRESSION HIP SCREW Right 03/15/2013   Procedure: COMPRESSION HIP;  Surgeon: Sanjuana Kava, MD;  Location: [facility];  Service: Orthopedics;  Laterality: Right;   CORONARY ATHERECTOMY N/A 11/24/2017   Procedure: CORONARY ATHERECTOMY;  Surgeon: Burnell Blanks, MD;  Location: [facility];  Service: Cardiovascular;  Laterality: N/A;   CORONARY BALLOON ANGIOPLASTY N/A 11/24/2017   Procedure: CORONARY BALLOON ANGIOPLASTY;  Surgeon: Burnell Blanks, MD;  Location: [facility];  Service: Cardiovascular;  Laterality: N/A;   CORONARY STENT INTERVENTION N/A 11/24/2017   Procedure: CORONARY STENT INTERVENTION;  Surgeon: Burnell Blanks, MD;  Location: [facility];  Service: Cardiovascular;  Laterality: N/A;   FRACTURE SURGERY     PACEMAKER IMPLANT N/A 11/27/2017   Procedure: PACEMAKER IMPLANT;  Surgeon: Evans Lance, MD;  Location: [facility];  Service: Cardiovascular;  Laterality: N/A;   RIGHT/LEFT HEART CATH AND CORONARY ANGIOGRAPHY N/A 11/23/2017   Procedure: RIGHT/LEFT HEART CATH AND CORONARY ANGIOGRAPHY;  Surgeon: Burnell Blanks, MD;  Location: [facility];  Service: Cardiovascular;  Laterality: N/A;   SKIN GRAFT Left    from right thigh   TONSILLECTOMY      SOCIAL HISTORY:  Social History   Socioeconomic History   Marital status: Married    Spouse name: Not on file   Number of children: Not on file   Years of education: Not on file   Highest education level: Not on file  Occupational History   Not on file  Tobacco Use   Smoking status: Former    Packs/day: 1.00    Years: 20.00    Total pack years:  20.00    Types: Cigarettes    Quit date: 11/01/1979    Years since quitting: 42.7   Smokeless tobacco: Never  Vaping Use   Vaping Use: Never used  Substance and Sexual Activity   Alcohol use: Yes    Comment: 11/23/2017 "beer once/month maybe"   Drug use: Yes    Types: Marijuana    Comment: 11/23/2017 "nothing for years"   Sexual activity: Yes    Birth control/protection: None  Other Topics Concern   Not on file  Social History Narrative   Not on file   Social Determinants of Health   Financial Resource Strain: Low Risk  (01/14/2020)   Overall Financial Resource Strain  (CARDIA)    Difficulty of Paying Living Expenses: Not very hard  Food Insecurity: No Food Insecurity (01/14/2020)   Hunger Vital Sign    Worried About Running Out of Food in the Last Year: Never true    Ran Out of Food in the Last Year: Never true  Transportation Needs: No Transportation Needs (01/14/2020)   PRAPARE - Hydrologist (Medical): No    Lack of Transportation (Non-Medical): No  Physical Activity: Inactive (01/14/2020)   Exercise Vital Sign    Days of Exercise per Week: 0 days    Minutes of Exercise per Session: 0 min  Stress: No Stress Concern Present (01/14/2020)   [place]    Feeling of Stress : Not at all  Social Connections: Moderately Isolated (01/14/2020)   Social Connection and Isolation Panel [NHANES]    Frequency of Communication with Friends and Family: Twice a week    Frequency of Social Gatherings with Friends and Family: Twice a week    Attends Religious Services: Never    Marine scientist or Organizations: No    Attends Archivist Meetings: Never    Marital Status: Married  Human resources officer Violence: Unknown (01/14/2020)   Humiliation, Afraid, Rape, and Kick questionnaire    Fear of Current or Ex-Partner: Patient refused    Emotionally Abused: Patient refused    Physically Abused: Patient refused    Sexually Abused: Patient refused    FAMILY HISTORY:  Family History  Problem Relation Age of Onset   Diabetes Mother    Heart attack Father     CURRENT MEDICATIONS:  Current Outpatient Medications  Medication Sig Dispense Refill   amLODipine (NORVASC) 5 MG tablet Take 5 mg by mouth daily.     apixaban (ELIQUIS) 2.5 MG TABS tablet Take 1 tablet (2.5 mg total) by mouth 2 (two) times daily. 60 tablet 5   atorvastatin (LIPITOR) 80 MG tablet Take 1 tablet (80 mg total) by mouth daily. 90 tablet 3   clopidogrel (PLAVIX) 75 MG tablet Take 1 tablet by  mouth once daily 90 tablet 3   diclofenac Sodium (VOLTAREN) 1 % GEL diclofenac 1 % topical gel  APPLY 2 GRAMS TO THE AFFECTED AREA 4 TIMES PER DAY     digoxin (LANOXIN) 0.125 MG tablet Take 1 tablet (125 mcg total) by mouth daily. 90 tablet 3   furosemide (LASIX) 40 MG tablet Take 1 tablet (40 mg total) by mouth daily. 30 tablet 6   HYDROcodone-acetaminophen (NORCO) 7.5-325 MG tablet hydrocodone 7.5 mg-acetaminophen 325 mg tablet  TAKE ONE TABLET BY MOUTH twice daily     lisinopril (ZESTRIL) 40 MG tablet Take 40 mg by mouth daily.     LOKELMA 10 g PACK packet  Take 1 packet by mouth daily.     losartan (COZAAR) 50 MG tablet Take 1 tablet by mouth daily.     metoprolol succinate (TOPROL-XL) 25 MG 24 hr tablet TAKE 1/2 (ONE-HALF) TABLET BY MOUTH IN THE MORNING AND 1 TABLET IN THE EVENING 135 tablet 3   tamsulosin (FLOMAX) 0.4 MG CAPS capsule Take 0.4 mg by mouth daily.     triamcinolone cream (KENALOG) 0.1 % SMARTSIG:Sparingly Topical Twice Daily     No current facility-administered medications for this visit.    ALLERGIES:  No Known Allergies  PHYSICAL EXAM:  Performance status (ECOG): 1 - Symptomatic but completely ambulatory  There were no vitals filed for this visit.  Wt Readings from Last 3 Encounters:  05/25/22 125 lb 3.2 oz (56.8 kg)  01/11/22 128 lb 4.8 oz (58.2 kg)  11/04/21 134 lb 9.6 oz (61.1 kg)   Physical Exam Vitals reviewed.  Constitutional:      Appearance: Normal appearance.  Cardiovascular:     Rate and Rhythm: Normal rate and regular rhythm.     Pulses: Normal pulses.     Heart sounds: Normal heart sounds.  Pulmonary:     Effort: Pulmonary effort is normal.     Breath sounds: Normal breath sounds.  Neurological:     General: No focal deficit present.     Mental Status: He is alert and oriented to person, place, and time.  Psychiatric:        Mood and Affect: Mood normal.        Behavior: Behavior normal.     LABORATORY DATA:  I have reviewed the labs  as listed.     Latest Ref Rng & Units 07/27/2022   10:45 AM 01/03/2022    2:25 PM 06/17/2021   11:44 AM  CBC  WBC 4.0 - 10.5 K/uL 6.0  5.1  7.3   Hemoglobin 13.0 - 17.0 g/dL 12.2  13.7  13.9   Hematocrit 39.0 - 52.0 % 36.2  41.4  41.7   Platelets 150 - 400 K/uL 210  243  251       Latest Ref Rng & Units 07/27/2022   10:45 AM 01/03/2022    2:25 PM 06/17/2021   11:44 AM  CMP  Glucose 70 - 99 mg/dL 108  99  125   BUN 8 - 23 mg/dL 27  34  40   Creatinine 0.61 - 1.24 mg/dL 1.52  1.71  1.72   Sodium 135 - 145 mmol/L 134  133  135   Potassium 3.5 - 5.1 mmol/L 4.4  4.7  5.0   Chloride 98 - 111 mmol/L 104  103  104   CO2 22 - 32 mmol/L '24  24  25   '$ Calcium 8.9 - 10.3 mg/dL 8.4  8.4  8.6   Total Protein 6.5 - 8.1 g/dL 5.9  6.4  6.2   Total Bilirubin 0.3 - 1.2 mg/dL 1.0  1.1  1.3   Alkaline Phos 38 - 126 U/L 90  89  80   AST 15 - 41 U/L '19  17  19   '$ ALT 0 - 44 U/L '15  16  17       '$ Component Value Date/Time   RBC 3.63 (L) 07/27/2022 1045   MCV 99.7 07/27/2022 1045   MCV 89.2 02/12/2016 1100   MCH 33.6 07/27/2022 1045   MCHC 33.7 07/27/2022 1045   RDW 13.7 07/27/2022 1045   RDW 15.1 02/12/2016 1100   LYMPHSABS 0.7 07/27/2022  1045   MONOABS 0.9 07/27/2022 1045   EOSABS 0.1 07/27/2022 1045   BASOSABS 0.0 07/27/2022 1045    DIAGNOSTIC IMAGING:  I have independently reviewed the scans and discussed with the patient. CT Chest Wo Contrast  Result Date: 07/28/2022 CLINICAL DATA:  Follow-up lung nodule.  MGUS. EXAM: CT CHEST WITHOUT CONTRAST TECHNIQUE: Multidetector CT imaging of the chest was performed following the standard protocol without IV contrast. RADIATION DOSE REDUCTION: This exam was performed according to the departmental dose-optimization program which includes automated exposure control, adjustment of the mA and/or kV according to patient size and/or use of iterative reconstruction technique. COMPARISON:  06/17/2021 FINDINGS: Cardiovascular: Heart size appears within normal  limits. There is no pericardial effusion. Aortic atherosclerosis. Coronary artery calcifications. Left chest wall pacer is noted with leads in the right atrial appendage and right ventricle. Mediastinum/Nodes: No enlarged mediastinal or axillary lymph nodes. Thyroid gland, trachea, and esophagus demonstrate no significant findings. Lungs/Pleura: Moderate centrilobular emphysema. No pleural effusion identified. No airspace consolidation, atelectasis or pneumothorax. Several lung nodules are again identified. -stable 3 mm right upper lobe lung nodule, image 47/4. -subpleural nodule within the left posterior lung base measures 6 mm and is stable from previous exam, image 131/4. -Previous 4 mm right lower lobe lung nodule has resolved in the interval. -subpleural nodule in the posterolateral right base is stable measuring 9 mm, image 145/4. -Subpleural nodule in the posterolateral left lower lobe is unchanged measuring 4 mm, image 109/4. -No new or enlarging lung nodules. Upper Abdomen: No acute abnormality. Cyst within lateral segment of left lobe of liver measures 1.1 cm. Musculoskeletal: No chest wall mass or suspicious bone lesions identified. IMPRESSION: 1. Stable appearance of bilateral pulmonary nodules. These are consistent with a benign process require no further follow-up. 2. Coronary artery calcifications. 3. Aortic Atherosclerosis (ICD10-I70.0) and Emphysema (ICD10-J43.9). Electronically Signed   By: Kerby Moors M.D.   On: 07/28/2022 14:27   CUP PACEART REMOTE DEVICE CHECK  Result Date: 07/16/2022 Scheduled remote reviewed. Normal device function.  Known AF, on [place] according to previous reports, programmed VVIR Next remote 91 days. Kathy Breach, RN, CCDS, [employer]    ASSESSMENT:  1.  IgG kappa MGUS: -Labs from 05/05/2020 was reviewed.  SPEP showed 0.4 g/dL which is stable. -Free light chain ratio increased to 3.94.  Kappa light chains increased to 61.  Creatinine has also increased to  2.41 from 1.8 in March. -The elevated free light chain ratio is probably due to worsening kidney function.  He is on medications which could affect his creatinine.     2.  Right lower lobe lung nodule: -I reviewed results of CT chest without contrast on 05/05/2020 which showed right lower lobe 8 mm solid pulmonary nodule, obscured by atelectasis on 11/23/2015 chest CT.  No thoracic adenopathy.  Moderate centrilobular emphysema with mild diffuse bronchial wall thickening suggesting COPD.  Nonspecific patchy subpleural reticulation at the lung bases.  Interstitial lung disease including early UIP is not excluded. -He quit smoking 30 years ago.  Smoked 1 pack/day for 20 to 30 years.  He also worked as a Nature conservation officer.   PLAN:  1.  IgG kappa MGUS: - Reviewed labs from 07/27/2022.  M spike is 0.7 g and slightly up from previous 2 values.  Creatinine is stable at 1.52.  Calcium is 8.4.  Hemoglobin is 12.2. - Free light chain ratio is 3.49 and kappa light chains 57 more or less stable. - No "crab" features at  this time to warrant further work-up and treatment. - RTC 6 months for follow-up with repeat labs.  We will also repeat skeletal survey at next visit.   2.  Right lower lobe lung nodule: - CT chest without contrast from 07/27/2022 reviewed by me showed stable appearance of bilateral lung nodules consistent with benign process. - No further follow-up is needed.   3.  CKD: - Creatinine is 1.52 and stable.  Continue follow-up with Dr. Theador Hawthorne.  Orders placed this encounter:  No orders of the defined types were placed in this encounter.    Derek Jack, MD [place] [phone]

## 2022-08-09 ENCOUNTER — Telehealth: Payer: Medicare HMO | Admitting: Nurse Practitioner

## 2022-08-11 ENCOUNTER — Telehealth: Payer: Medicare HMO | Admitting: Nurse Practitioner

## 2022-08-16 ENCOUNTER — Telehealth: Payer: Medicare HMO | Admitting: Nurse Practitioner

## 2022-08-17 DIAGNOSIS — N1831 Chronic kidney disease, stage 3a: Secondary | ICD-10-CM | POA: Diagnosis not present

## 2022-08-17 DIAGNOSIS — D638 Anemia in other chronic diseases classified elsewhere: Secondary | ICD-10-CM | POA: Diagnosis not present

## 2022-08-17 DIAGNOSIS — I129 Hypertensive chronic kidney disease with stage 1 through stage 4 chronic kidney disease, or unspecified chronic kidney disease: Secondary | ICD-10-CM | POA: Diagnosis not present

## 2022-08-17 DIAGNOSIS — I5022 Chronic systolic (congestive) heart failure: Secondary | ICD-10-CM | POA: Diagnosis not present

## 2022-08-17 DIAGNOSIS — D472 Monoclonal gammopathy: Secondary | ICD-10-CM | POA: Diagnosis not present

## 2022-08-17 DIAGNOSIS — R808 Other proteinuria: Secondary | ICD-10-CM | POA: Diagnosis not present

## 2022-09-07 ENCOUNTER — Encounter: Payer: Self-pay | Admitting: Nurse Practitioner

## 2022-09-07 ENCOUNTER — Telehealth: Payer: Medicare HMO | Admitting: Nurse Practitioner

## 2022-09-07 DIAGNOSIS — Z515 Encounter for palliative care: Secondary | ICD-10-CM | POA: Diagnosis not present

## 2022-09-07 DIAGNOSIS — R630 Anorexia: Secondary | ICD-10-CM | POA: Diagnosis not present

## 2022-09-07 DIAGNOSIS — R5381 Other malaise: Secondary | ICD-10-CM | POA: Diagnosis not present

## 2022-09-07 NOTE — Progress Notes (Signed)
[facility] Consult Note Telephone: [phone]  Fax: [phone]    Date of encounter: 09/07/22 11:02 PM PATIENT NAME: Ricardo Garrett   [phone] (home)  DOB: [birth year] MRN: [id number] PRIMARY CARE PROVIDER:    [name], MD,  [place] [address] [phone]  REFERRING PROVIDER:   [name], MD [place],  [place] [address] [phone]  RESPONSIBLE PARTY:    Contact Information     Name Relation Home Work Mobile   Ricardo Garrett Spouse [place] Son [phone]     Ricardo Garrett Garrett [phone]  [phone]            [facility] Note Telephone: [phone]  Fax: [phone]    Date of encounter: 01/13/22 4:23 PM PATIENT NAME: Ricardo Garrett   [phone] (home)  DOB: 1946-04-04 MRN: [id number] PRIMARY CARE PROVIDER:    [name], MD,  [place] [address] [phone]   REFERRING PROVIDER:   [name], MD [place],  [place] [address] [phone]   RESPONSIBLE PARTY:    Contact Information       Name Relation Home Work Mobile    Ricardo Garrett Spouse [place] Son [phone]        Ricardo Garrett Garrett [phone]   [phone]         Due to the COVID-19 crisis, this visit was done via telemedicine from my office and it was initiated and consent by this patient and or family.   I connected with Ricardo Garrett, Ricardo Garrett with  Ricardo Garrett OR PROXY on 01/13/22 by phone as video not available enabled telemedicine application and verified that I am speaking with the correct person using two identifiers.   I discussed the limitations of evaluation and management by telemedicine. The patient  expressed understanding and agreed to proceed.  Palliative Care was asked to follow this patient by consultation request of  Ricardo Squibb, MD to address advance care planning and complex medical decision making. This is the initial visit.                         ASSESSMENT AND PLAN / RECOMMENDATIONS:  Symptom Management/Plan: 1. Advance Care Planning;  full code  PC NP to sign off due to program change with PC RN/PC SW will continue to follow, monitor decline.   2. Debility secondary to decompensation; discussed functional abilities, baths, dresses, with overall decline. We talked about Ricardo Garrett sleeping a lot lately and Ricardo Garrett will schedule f/u with Ricardo Garrett to for further evaluation.    3. Anorexia; discussed at length with Ricardo Garrett current meals, nutrition, education done;    06/22/2021 weight 132.1 lbs 11/04/2021 weight 134 lbs 01/11/2022 weight 128 lbs 08/17/2022 weight 124 lbs  4. Goals of Care: Goals include to maximize quality of life and symptom management. Our advance care planning conversation included a discussion about:    The value and importance of advance care planning  Exploration of personal, cultural or spiritual beliefs that might influence medical decisions  Exploration of goals of care in the event of a sudden injury or illness  Identification and preparation of a healthcare agent  Review and updating or creation of  an advance directive document.   5. Palliative care encounter; Palliative care encounter; Palliative medicine team will continue to support patient, patient's family, and medical team. Visit consisted of counseling and education dealing with the complex and emotionally intense issues of symptom management and palliative care in the setting of serious and potentially life-threatening illness I spent 21 minutes providing this consultation. More than 50% of the time in this consultation was spent in counseling and care coordination. PPS: 50%   Chief Complaint:  Initial palliative consult for complex medical decision making   HISTORY OF PRESENT ILLNESS:  Ricardo Garrett is a 76 y.o. year old male  with multiple medical problems including MGUS (monoclonal gammopathy of unknown significance), right lung nodule, COPD, CAD, CHF (EF 35-40%) afib, HTN, HLD, HLD, HOH, arthritis. I called Ricardo Garrett, Ricardo Garrett Garrett for follow up palliative consult for telemedicine as video not available. Ricardo Garrett endorses Ricardo Garrett has been declining the last few weeks, decrease in appetite, sleeping more, increase in confusion. Ricardo Garrett mentioned the caregiver who stays with Ricardo and Mrs Pressly had mentioned concern for possible Uti. Discussed with Ricardo Garrett to contact primary office for f/u visit to evaluate s/s with h/o previous Uti's. We talked about sleep patterns, sleep hygiene. We talked about appetite which has been declined. We talked about does not appear to have lost weight. We talked about funcitonal abilities with decrease in cognition. We talked about medical goals. We talked about poc. We talked about PC NP to sign off due to program change with PC RN/PC SW will continue to follow, monitor decline. Ricardo Garrett in agreement. Therapeutic listening, emotional support provided. Questions answered. Ricardo Garrett in agreement with poc. Contact information provided.      History obtained from review of EMR, discussion with Ricardo Garrett Garrett with Mrs and  Ricardo. Garrett.  I reviewed available labs, medications, imaging, studies and related documents from the EMR.  Records reviewed and summarized above.   ROS 10 point system reviewed all negative except +confusion  Physical Exam: Deferred  Thank you for the opportunity to participate in the care of Ricardo. Ricardo Garrett. Please call our office at [phone] if we can be of additional assistance.   Adonte Vanriper Ihor Gully, NP

## 2022-09-09 ENCOUNTER — Emergency Department (HOSPITAL_COMMUNITY): Payer: Medicare HMO

## 2022-09-09 ENCOUNTER — Emergency Department (HOSPITAL_COMMUNITY)
Admission: EM | Admit: 2022-09-09 | Discharge: 2022-09-09 | Disposition: A | Discharge status: Home / Self Care | Payer: Medicare HMO | Attending: Emergency Medicine | Admitting: Emergency Medicine

## 2022-09-09 ENCOUNTER — Encounter (HOSPITAL_COMMUNITY): Payer: Self-pay

## 2022-09-09 ENCOUNTER — Other Ambulatory Visit: Payer: Self-pay

## 2022-09-09 DIAGNOSIS — Z7902 Long term (current) use of antithrombotics/antiplatelets: Secondary | ICD-10-CM | POA: Diagnosis not present

## 2022-09-09 DIAGNOSIS — I4891 Unspecified atrial fibrillation: Secondary | ICD-10-CM | POA: Diagnosis not present

## 2022-09-09 DIAGNOSIS — M4313 Spondylolisthesis, cervicothoracic region: Secondary | ICD-10-CM | POA: Diagnosis not present

## 2022-09-09 DIAGNOSIS — M545 Low back pain, unspecified: Secondary | ICD-10-CM | POA: Diagnosis not present

## 2022-09-09 DIAGNOSIS — Z8673 Personal history of transient ischemic attack (TIA), and cerebral infarction without residual deficits: Secondary | ICD-10-CM | POA: Diagnosis not present

## 2022-09-09 DIAGNOSIS — Z79899 Other long term (current) drug therapy: Secondary | ICD-10-CM | POA: Insufficient documentation

## 2022-09-09 DIAGNOSIS — M79641 Pain in right hand: Secondary | ICD-10-CM | POA: Insufficient documentation

## 2022-09-09 DIAGNOSIS — R410 Disorientation, unspecified: Secondary | ICD-10-CM | POA: Diagnosis not present

## 2022-09-09 DIAGNOSIS — Z7901 Long term (current) use of anticoagulants: Secondary | ICD-10-CM | POA: Diagnosis not present

## 2022-09-09 DIAGNOSIS — R918 Other nonspecific abnormal finding of lung field: Secondary | ICD-10-CM | POA: Diagnosis not present

## 2022-09-09 DIAGNOSIS — E875 Hyperkalemia: Secondary | ICD-10-CM | POA: Insufficient documentation

## 2022-09-09 DIAGNOSIS — I6523 Occlusion and stenosis of bilateral carotid arteries: Secondary | ICD-10-CM | POA: Diagnosis not present

## 2022-09-09 DIAGNOSIS — R0602 Shortness of breath: Secondary | ICD-10-CM | POA: Insufficient documentation

## 2022-09-09 DIAGNOSIS — W19XXXA Unspecified fall, initial encounter: Secondary | ICD-10-CM | POA: Diagnosis not present

## 2022-09-09 DIAGNOSIS — M25522 Pain in left elbow: Secondary | ICD-10-CM | POA: Diagnosis not present

## 2022-09-09 DIAGNOSIS — M47812 Spondylosis without myelopathy or radiculopathy, cervical region: Secondary | ICD-10-CM | POA: Diagnosis not present

## 2022-09-09 DIAGNOSIS — I10 Essential (primary) hypertension: Secondary | ICD-10-CM | POA: Diagnosis not present

## 2022-09-09 DIAGNOSIS — I672 Cerebral atherosclerosis: Secondary | ICD-10-CM | POA: Diagnosis not present

## 2022-09-09 DIAGNOSIS — R4182 Altered mental status, unspecified: Secondary | ICD-10-CM | POA: Diagnosis present

## 2022-09-09 DIAGNOSIS — Z950 Presence of cardiac pacemaker: Secondary | ICD-10-CM | POA: Diagnosis not present

## 2022-09-09 DIAGNOSIS — R220 Localized swelling, mass and lump, head: Secondary | ICD-10-CM | POA: Diagnosis not present

## 2022-09-09 LAB — BASIC METABOLIC PANEL
Anion gap: 7 (ref 5–15)
BUN: 33 mg/dL — ABNORMAL HIGH (ref 8–23)
CO2: 23 mmol/L (ref 22–32)
Calcium: 9 mg/dL (ref 8.9–10.3)
Chloride: 106 mmol/L (ref 98–111)
Creatinine, Ser: 1.81 mg/dL — ABNORMAL HIGH (ref 0.61–1.24)
GFR, Estimated: 38 mL/min — ABNORMAL LOW (ref >60)
Glucose, Bld: 105 mg/dL — ABNORMAL HIGH (ref 70–99)
Potassium: 5.8 mmol/L — ABNORMAL HIGH (ref 3.5–5.1)
Sodium: 136 mmol/L (ref 135–145)

## 2022-09-09 LAB — BRAIN NATRIURETIC PEPTIDE: B Natriuretic Peptide: 795 pg/mL — ABNORMAL HIGH (ref 0.0–100.0)

## 2022-09-09 LAB — CBG MONITORING, ED: Glucose-Capillary: 91 mg/dL (ref 70–99)

## 2022-09-09 LAB — COMPREHENSIVE METABOLIC PANEL
ALT: 15 U/L (ref 0–44)
AST: 17 U/L (ref 15–41)
Albumin: 3.6 g/dL (ref 3.5–5.0)
Alkaline Phosphatase: 70 U/L (ref 38–126)
Anion gap: 5 (ref 5–15)
BUN: 33 mg/dL — ABNORMAL HIGH (ref 8–23)
CO2: 23 mmol/L (ref 22–32)
Calcium: 8.8 mg/dL — ABNORMAL LOW (ref 8.9–10.3)
Chloride: 105 mmol/L (ref 98–111)
Creatinine, Ser: 1.85 mg/dL — ABNORMAL HIGH (ref 0.61–1.24)
GFR, Estimated: 37 mL/min — ABNORMAL LOW (ref >60)
Glucose, Bld: 111 mg/dL — ABNORMAL HIGH (ref 70–99)
Potassium: 5.7 mmol/L — ABNORMAL HIGH (ref 3.5–5.1)
Sodium: 133 mmol/L — ABNORMAL LOW (ref 135–145)
Total Bilirubin: 1.1 mg/dL (ref 0.3–1.2)
Total Protein: 6.7 g/dL (ref 6.5–8.1)

## 2022-09-09 LAB — CBC
HCT: 38 % — ABNORMAL LOW (ref 39.0–52.0)
Hemoglobin: 12.8 g/dL — ABNORMAL LOW (ref 13.0–17.0)
MCH: 34.1 pg — ABNORMAL HIGH (ref 26.0–34.0)
MCHC: 33.7 g/dL (ref 30.0–36.0)
MCV: 101.3 fL — ABNORMAL HIGH (ref 80.0–100.0)
Platelets: 223 10*3/uL (ref 150–400)
RBC: 3.75 MIL/uL — ABNORMAL LOW (ref 4.22–5.81)
RDW: 13.6 % (ref 11.5–15.5)
WBC: 6.3 10*3/uL (ref 4.0–10.5)
nRBC: 0 % (ref 0.0–0.2)

## 2022-09-09 LAB — URINALYSIS, ROUTINE W REFLEX MICROSCOPIC
Bacteria, UA: NONE SEEN
Bilirubin Urine: NEGATIVE
Glucose, UA: NEGATIVE mg/dL
Hgb urine dipstick: NEGATIVE
Ketones, ur: NEGATIVE mg/dL
Leukocytes,Ua: NEGATIVE
Nitrite: NEGATIVE
Protein, ur: >=300 mg/dL — AB
Specific Gravity, Urine: 1.016 (ref 1.005–1.030)
pH: 5 (ref 5.0–8.0)

## 2022-09-09 LAB — DIGOXIN LEVEL: Digoxin Level: 1.1 ng/mL (ref 0.8–2.0)

## 2022-09-09 MED ORDER — SODIUM ZIRCONIUM CYCLOSILICATE 5 G PO PACK
5.0000 g | PACK | Freq: Once | ORAL | Status: AC
  Administered 2022-09-09: 5 g via ORAL
  Filled 2022-09-09: qty 1

## 2022-09-09 NOTE — ED Provider Triage Note (Signed)
Emergency Medicine Provider Triage Evaluation Note  Ricardo Garrett , a 76 y.o. male  was evaluated in triage.  Pt complains of altered mental status.  Wife states that he had a fall approximately 2 weeks ago.  States that he did hit his head.  Since then he has had left arm pain and upper left back pain.  She also states that he has been more confused and disoriented at home.  This started about a week ago.  She is concerned that he might also have a urinary tract infection "since he is really not been acting himself".  Denies fever at home.  Wife states that he also has a residing pacemaker.  Patient concerned that he may have hit his pacemaker during the fall.  Denies chest pain shortness of breath.  Patient states that today also he has felt more dizzy.  Denies room spinning sensation but states that he has difficulty keeping his balance.  Review of Systems  Positive: See above Negative: See above  Physical Exam  BP 100/71 (BP Location: Right Arm)   Pulse 66   Temp 97.7 F (36.5 C) (Oral)   Resp 16   Ht '5\' 8"'$  (1.727 m)   SpO2 100%   BMI 18.95 kg/m  Gen:   Awake, no distress   Resp:  Normal effort  MSK:   Moves extremities without difficulty, midline cervical tenderness.  Unable to assess gait due to dizziness. Other:  Unsure where he is, does not know who the president is, was able to state his name and birthday.  Medical Decision Making  Medically screening exam initiated at 1:55 PM.  Appropriate orders placed.  MAHMOOD BOEHRINGER was informed that the remainder of the evaluation will be completed by another provider, this initial triage assessment does not replace that evaluation, and the importance of remaining in the ED until their evaluation is complete.  Work-up initiated   Harriet Pho, PA-C 09/09/22 1400

## 2022-09-09 NOTE — ED Triage Notes (Signed)
Pt accompanied by CNA.  CNA reports for the past week pt has been hallucinating, disoriented, and not eating well.  Reports pt c/o burning with urination for the past 2 weeks.  C/O left arm pain and back pain x 4 or 5 days. Pt says he tripped and fell in his yard 2 weeks ago.

## 2022-09-09 NOTE — Discharge Instructions (Addendum)
The visit emergency department today was overall reassuring.  Your hyperkalemia or high potassium was treated while in the emergency department.  Recommend continuing your at home medications as prescribed.  Follow-up with PCP for reevaluation of your symptoms as well as Dr. Hortense Ramal for any recurrent symptoms.  Please not hesitate to return to emergency department for worrisome signs and symptoms we discussed become apparent.

## 2022-09-09 NOTE — ED Notes (Signed)
Pt in imaging. EDPA at [facility] speaking with family.

## 2022-09-09 NOTE — ED Notes (Signed)
Into exam room by w/c from xray. Family at [facility].

## 2022-09-09 NOTE — ED Notes (Signed)
Pt coming back from CT, intercepted by xray, placed in w/c and taken to xray. Alert, NAD, calm, ambulatory, steady gait. Family remains in exam room.

## 2022-09-09 NOTE — ED Provider Notes (Signed)
[facility] EMERGENCY DEPARTMENT Provider Note   CSN: [id number] Arrival date & time: 09/09/22  1232     History  Chief Complaint  Patient presents with   Altered Mental Status    Ricardo Garrett is a 76 y.o. male.   Altered Mental Status   76 year old male presents emergency department with complaints of altered mentation.  Patient states that he has been "disorientated" for the past 1 to 2 weeks.  Patient notes fall approximately 2 weeks ago with associated low back pain, left elbow pain and right hand pain.  He states that he has been dealing with increasing pain in affected areas and has been taking 1-2 Norco at home per day secondary to pain.  He states he has been not ambulating very much because of the pain.  He reports altered mentation as difficulty recalling details of his day.  Patient recently had Lexapro added to his medication regimen 2 to 3 months ago.  He states he has not had any of his medication today and feels at his baseline.  CNA at bedside states that patient has not been eating very much of anything and only drinking a few beers for the next few days.  Regarding fall, denies any trauma to head.  He states that he was not on the ground a matter of seconds before he stood up independent of assistance.  Patient is currently on Eliquis and Plavix for atrial fibrillation.  Regarding back pain, denies saddle esthesia, bowel/bladder dysfunction, weakness/sensory deficits in lower extremities, fever, history of IV drug use.  Denies fever, chills, night sweats, chest pain, shortness of breath, abdominal pain, nausea, vomiting, urinary symptoms, change in bowel habits.  Home Medications Prior to Admission medications   Medication Sig Start Date End Date Taking? Authorizing Provider  amLODipine (NORVASC) 5 MG tablet Take 5 mg by mouth daily. 05/11/22   [provider]  apixaban (ELIQUIS) 2.5 MG TABS tablet Take 1 tablet (2.5 mg total) by mouth 2 (two) times  daily. 05/20/19   Josue Hector, MD  atorvastatin (LIPITOR) 80 MG tablet Take 1 tablet (80 mg total) by mouth daily. 05/02/19   Josue Hector, MD  clopidogrel (PLAVIX) 75 MG tablet Take 1 tablet by mouth once daily 03/31/20   Josue Hector, MD  diclofenac Sodium (VOLTAREN) 1 % GEL diclofenac 1 % topical gel  APPLY 2 GRAMS TO THE AFFECTED AREA 4 TIMES PER DAY    [provider]  digoxin (LANOXIN) 0.125 MG tablet Take 1 tablet (125 mcg total) by mouth daily. 05/02/19   Josue Hector, MD  escitalopram (LEXAPRO) 5 MG tablet Take 5 mg by mouth daily.    [provider]  furosemide (LASIX) 40 MG tablet Take 1 tablet (40 mg total) by mouth daily. 05/02/19   Josue Hector, MD  HYDROcodone-acetaminophen (NORCO) 7.5-325 MG tablet hydrocodone 7.5 mg-acetaminophen 325 mg tablet  TAKE ONE TABLET BY MOUTH twice daily    [provider]  lisinopril (ZESTRIL) 40 MG tablet Take 40 mg by mouth daily. 04/06/20   [provider]  LOKELMA 10 g PACK packet Take 1 packet by mouth daily. 06/03/21   [provider]  losartan (COZAAR) 50 MG tablet Take 1 tablet by mouth daily. 11/13/17   [provider]  metoprolol succinate (TOPROL-XL) 25 MG 24 hr tablet TAKE 1/2 (ONE-HALF) TABLET BY MOUTH IN THE MORNING AND 1 TABLET IN THE EVENING 05/02/19   Josue Hector, MD  tamsulosin [facility])  0.4 MG CAPS capsule Take 0.4 mg by mouth daily. 10/08/20   [provider]  triamcinolone cream (KENALOG) 0.1 % SMARTSIG:Sparingly Topical Twice Daily 06/08/21   [provider]      Allergies    Patient has no known allergies.    Review of Systems   Review of Systems  All other systems reviewed and are negative.   Physical Exam Updated Vital Signs BP 118/68 (BP Location: Right Arm)   Pulse 98   Temp 97.9 F (36.6 C) (Oral)   Resp 18   Ht '5\' 8"'$  (1.727 m)   SpO2 100%   BMI 18.95 kg/m  Physical Exam Vitals and nursing note reviewed.  Constitutional:      General:  He is not in acute distress.    Appearance: He is well-developed.  HENT:     Head: Normocephalic and atraumatic.  Eyes:     Conjunctiva/sclera: Conjunctivae normal.  Cardiovascular:     Rate and Rhythm: Normal rate and regular rhythm.     Heart sounds: No murmur heard. Pulmonary:     Effort: Pulmonary effort is normal. No respiratory distress.     Breath sounds: Normal breath sounds. No wheezing, rhonchi or rales.  Abdominal:     Palpations: Abdomen is soft.     Tenderness: There is no abdominal tenderness. There is no guarding.  Musculoskeletal:        General: No swelling.     Cervical back: Neck supple. No rigidity or tenderness.     Comments: No midline tenderness of cervical, thoracic, lumbar spine with no step-off or deformity noted.  No paraspinal tenderness in the lumbar or thoracic region.  No upper or lower extremity tenderness.  Patient moves all 4 extremities without difficulty.  Skin:    General: Skin is warm and dry.     Capillary Refill: Capillary refill takes less than 2 seconds.  Neurological:     Mental Status: He is alert.     Comments: Alert and oriented to self, place, time and event.   Speech is fluent, clear without dysarthria or dysphasia.   Strength symmetric in upper/lower extremities   Sensation intact in upper/lower extremities   Normal finger-to-nose and feet tapping.  CN I not tested  CN II grossly intact visual fields bilaterally. Did not visualize posterior eye.  CN III, IV, VI PERRLA and EOMs intact bilaterally  CN V Intact sensation to sharp and light touch to the face  CN VII facial movements symmetric  CN VIII not tested  CN IX, X no uvula deviation, symmetric rise of soft palate  CN XI 5/5 SCM and trapezius strength bilaterally  CN XII Midline tongue protrusion, symmetric L/R movements     Psychiatric:        Mood and Affect: Mood normal.     ED Results / Procedures / Treatments   Labs (all labs ordered are listed, but only  abnormal results are displayed) Labs Reviewed  COMPREHENSIVE METABOLIC PANEL - Abnormal; Notable for the following components:      Result Value   Sodium 133 (*)    Potassium 5.7 (*)    Glucose, Bld 111 (*)    BUN 33 (*)    Creatinine, Ser 1.85 (*)    Calcium 8.8 (*)    GFR, Estimated 37 (*)    All other components within normal limits  CBC - Abnormal; Notable for the following components:   RBC 3.75 (*)    Hemoglobin 12.8 (*)  HCT 38.0 (*)    MCV 101.3 (*)    MCH 34.1 (*)    All other components within normal limits  URINALYSIS, ROUTINE W REFLEX MICROSCOPIC - Abnormal; Notable for the following components:   Protein, ur >=300 (*)    All other components within normal limits  BASIC METABOLIC PANEL - Abnormal; Notable for the following components:   Potassium 5.8 (*)    Glucose, Bld 105 (*)    BUN 33 (*)    Creatinine, Ser 1.81 (*)    GFR, Estimated 38 (*)    All other components within normal limits  BRAIN NATRIURETIC PEPTIDE - Abnormal; Notable for the following components:   B Natriuretic Peptide 795.0 (*)    All other components within normal limits  DIGOXIN LEVEL  CBG MONITORING, ED    EKG None  Radiology DG Chest 2 View  Result Date: 09/09/2022 CLINICAL DATA:  Fall. EXAM: CHEST - 2 VIEW COMPARISON:  November 28, 2017. FINDINGS: The heart size and mediastinal contours are within normal limits. Left-sided pacemaker is unchanged in position. Mildly increased bilateral perihilar and basilar opacities are noted suggesting atelectasis or possibly infiltrates. The visualized skeletal structures are unremarkable. IMPRESSION: Mildly increased bilateral lung opacities are noted concerning for atelectasis or possibly infiltrates. Electronically Signed   By: Marijo Conception M.D.   On: 09/09/2022 15:23   CT Head Wo Contrast  Result Date: 09/09/2022 CLINICAL DATA:  Fall EXAM: CT HEAD WITHOUT CONTRAST CT CERVICAL SPINE WITHOUT CONTRAST TECHNIQUE: Multidetector CT imaging of the  head and cervical spine was performed following the standard protocol without intravenous contrast. Multiplanar CT image reconstructions of the cervical spine were also generated. RADIATION DOSE REDUCTION: This exam was performed according to the departmental dose-optimization program which includes automated exposure control, adjustment of the mA and/or kV according to patient size and/or use of iterative reconstruction technique. COMPARISON:  None Available. FINDINGS: CT HEAD FINDINGS Brain: There is no acute intracranial hemorrhage, extra-axial fluid collection, or acute infarct. There is background parenchymal volume loss with prominence of the ventricular system and extra-axial CSF spaces. There are remote infarcts in the right parietal lobe, right basal ganglia, and right cerebellar hemisphere with slight ex vacuo dilatation of the right lateral ventricle. Additional hypodensity in the supratentorial white matter likely reflects sequela of underlying chronic small vessel ischemic change. There is a heterogeneously calcified mass in the inferior aspect of the fourth ventricle measuring approximately 1.2 cm AP x 0.9 cm TV x 1.8 cm cc. There is no upstream hydrocephalus. There is no other mass lesion. There is no mass effect or midline shift. Vascular: There is calcification of the bilateral carotid siphons and vertebral arteries. Skull: Normal. Negative for fracture or focal lesion. Sinuses/Orbits: The imaged paranasal sinuses are clear. Bilateral lens implants are in place. The globes and orbits are otherwise unremarkable. Other: None. CT CERVICAL SPINE FINDINGS Alignment: There is 3 mm anterolisthesis of C7 on T1, likely degenerative in nature. There is no other antero or retrolisthesis. There is no jumped or perched facet or other evidence of traumatic malalignment. Skull base and vertebrae: Skull base alignment is maintained. Vertebral body heights are preserved. There is no evidence of acute fracture. There  is no suspicious osseous lesion. Soft tissues and spinal canal: No prevertebral fluid or swelling. No visible canal hematoma. Disc levels: There is advanced multilevel disc space narrowing, degenerative endplate change, and facet arthropathy throughout the cervical spine. There is multilevel probable mild-to-moderate spinal canal stenosis but no convincing  evidence of high-grade spinal canal stenosis. There is multilevel neural foraminal stenosis. Upper chest: The imaged lung apices are clear. Other: None. IMPRESSION: 1. No acute intracranial pathology. 2. 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 Recommend comparison with prior imaging to establish stability of this finding. Alternatively, nonemergent brain MRI with and without contrast is recommended for characterization. 3. Multiple remote infarcts as above. 4. No acute fracture or traumatic malalignment of the cervical spine. Electronically Signed   By: Valetta Mole M.D.   On: 09/09/2022 15:13   CT Cervical Spine Wo Contrast  Result Date: 09/09/2022 CLINICAL DATA:  Fall EXAM: CT HEAD WITHOUT CONTRAST CT CERVICAL SPINE WITHOUT CONTRAST TECHNIQUE: Multidetector CT imaging of the head and cervical spine was performed following the standard protocol without intravenous contrast. Multiplanar CT image reconstructions of the cervical spine were also generated. RADIATION DOSE REDUCTION: This exam was performed according to the departmental dose-optimization program which includes automated exposure control, adjustment of the mA and/or kV according to patient size and/or use of iterative reconstruction technique. COMPARISON:  None Available. FINDINGS: CT HEAD FINDINGS Brain: There is no acute intracranial hemorrhage, extra-axial fluid collection, or acute infarct. There is background parenchymal volume loss with prominence of the ventricular system and extra-axial CSF spaces. There are remote infarcts in the right parietal lobe,  right basal ganglia, and right cerebellar hemisphere with slight ex vacuo dilatation of the right lateral ventricle. Additional hypodensity in the supratentorial white matter likely reflects sequela of underlying chronic small vessel ischemic change. There is a heterogeneously calcified mass in the inferior aspect of the fourth ventricle measuring approximately 1.2 cm AP x 0.9 cm TV x 1.8 cm cc. There is no upstream hydrocephalus. There is no other mass lesion. There is no mass effect or midline shift. Vascular: There is calcification of the bilateral carotid siphons and vertebral arteries. Skull: Normal. Negative for fracture or focal lesion. Sinuses/Orbits: The imaged paranasal sinuses are clear. Bilateral lens implants are in place. The globes and orbits are otherwise unremarkable. Other: None. CT CERVICAL SPINE FINDINGS Alignment: There is 3 mm anterolisthesis of C7 on T1, likely degenerative in nature. There is no other antero or retrolisthesis. There is no jumped or perched facet or other evidence of traumatic malalignment. Skull base and vertebrae: Skull base alignment is maintained. Vertebral body heights are preserved. There is no evidence of acute fracture. There is no suspicious osseous lesion. Soft tissues and spinal canal: No prevertebral fluid or swelling. No visible canal hematoma. Disc levels: There is advanced multilevel disc space narrowing, degenerative endplate change, and facet arthropathy throughout the cervical spine. There is multilevel probable mild-to-moderate spinal canal stenosis but no convincing evidence of high-grade spinal canal stenosis. There is multilevel neural foraminal stenosis. Upper chest: The imaged lung apices are clear. Other: None. IMPRESSION: 1. No acute intracranial pathology. 2. 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 Recommend comparison with prior imaging to establish stability of this finding. Alternatively, nonemergent brain  MRI with and without contrast is recommended for characterization. 3. Multiple remote infarcts as above. 4. No acute fracture or traumatic malalignment of the cervical spine. Electronically Signed   By: Valetta Mole M.D.   On: 09/09/2022 15:13    Procedures Procedures    Medications Ordered in ED Medications  sodium zirconium cyclosilicate (LOKELMA) packet 5 g (5 g Oral Given 09/09/22 1725)    ED Course/ Medical Decision Making/ A&P  Medical Decision Making Amount and/or Complexity of Data Reviewed Labs: ordered.  Risk Prescription drug management.   This patient presents to the ED for concern of confusion, this involves an extensive number of treatment options, and is a complaint that carries with it a high risk of complications and morbidity.  The differential diagnosis includes medication side effect, CVA, electrolyte abnormality, metabolic encephalopathy, Warnicke's encephalopathy, Korsakoff, sepsis, urinary tract infection, pneumonia, meningitis, encephalitis, overdose, withdrawal  Co morbidities that complicate the patient evaluation  See HPI   Additional history obtained:  Additional history obtained from EMR External records from outside source obtained and reviewed including hospital records   Lab Tests:  I Ordered, and personally interpreted labs.  The pertinent results include: No leukocytosis.  Mild evidence of anemia with a hemoglobin of 12.8 which is slightly increased from prior CBC drawn.  Mild hyponatremia with a sodium of 133 which patient seems to be chronic without low.  Potassium 5.7 which patient was given 5 mg of Lokelma while in the emergency department.  Calcium Mia with a calcium of 8.8 of which patient is chronically low.  Renal function significant for BUN of 33, creatinine 1.85 and GFR of 37 which is near patient's baseline.  UA significant for greater than 300 proteins but otherwise within normal range.  BNP elevated of 795  with mild pulmonary vascular congestion; patient not actively complaining of chest pain or shortness of breath no lower extremity edema and has been without Lasix; continuation of at home medicines recommended as prescribed.  CBG within normal range.  Digoxin level of 1.1.   Imaging Studies ordered:  I ordered imaging studies including CT head, CT C-spine, chest x-ray I independently visualized and interpreted imaging which showed  CT head/C-spine: No acute intracranial pathology.  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  Multiple remote infarcts as above.  No acute fracture or traumatic malalignment of the C-spine. Chest x-ray: Mildly increased bilateral lung opacities are noted concerning for atelectasis or possibly infiltrates. I agree with the radiologist interpretation  Cardiac Monitoring: / EKG:  The patient was maintained on a cardiac monitor.  I personally viewed and interpreted the cardiac monitored which showed an underlying rhythm of: Ventricularly paced rhythm with a rate of 61   Consultations Obtained:  Attending physician Dr. Roderic Palau was consulted regarding patient who assessed the patient independently and is agreement with treatment plan.  Problem List / ED Course / Critical interventions / Medication management  Hyperkalemia/confusion I ordered medication including Lokelma for hyperkalemia   Reevaluation of the patient after these medicines showed that the patient stayed the same I have reviewed the patients home medicines and have made adjustments as needed   Social Determinants of Health:  Former cigarette use.  Chronic alcohol use.  Denies illicit drug use.   Test / Admission - Considered:  Confusion/hyperkalemia Vitals signs  within normal range and stable throughout visit. Laboratory/imaging studies significant for: See above Patient noted return to baseline today of symptoms.  Symptoms seem to be tied to patient's  back pain as experienced earlier as patient was moving minimally and in agitated mood with associated pain.  Patient without current back pain and without current state of "confusion."  Elevated BNP with mild possible infiltrates on chest x-ray but patient not currently complaining of chest pain and already on at home diuretic with missed dose today so continued at home diuretics recommended.  Work-up today overall reassuring with treatment of patient's hyperkalemia.  Both patient  and caregiver requesting to be discharged.  Patient assessed by attending physician Dr.  Roderic Palau, and he was in agreement with treatment plan going forward.  Recommend follow-up with primary care provider as well as neurology outpatient for reevaluation of symptoms.  Also recommended to patient regarding follow-up MRI given CT head findings today.  Treatment plan discussed at length with patient and he did understand was agreeable to said plan. Worrisome signs and symptoms were discussed with the patient, and the patient acknowledged understanding to return to the ED if noticed. Patient was stable upon discharge.         Final Clinical Impression(s) / ED Diagnoses Final diagnoses:  Confusion  Hyperkalemia    Rx / DC Orders ED Discharge Orders     None         Wilnette Kales, [place] 09/09/22 1759    Milton Ferguson, MD 09/10/22 1026

## 2022-09-09 NOTE — ED Notes (Signed)
ED PA at BS 

## 2022-09-13 DIAGNOSIS — E782 Mixed hyperlipidemia: Secondary | ICD-10-CM | POA: Diagnosis not present

## 2022-09-13 DIAGNOSIS — R7303 Prediabetes: Secondary | ICD-10-CM | POA: Diagnosis not present

## 2022-09-28 DIAGNOSIS — I482 Chronic atrial fibrillation, unspecified: Secondary | ICD-10-CM | POA: Diagnosis not present

## 2022-09-28 DIAGNOSIS — E782 Mixed hyperlipidemia: Secondary | ICD-10-CM | POA: Diagnosis not present

## 2022-09-28 DIAGNOSIS — D1430 Benign neoplasm of unspecified bronchus and lung: Secondary | ICD-10-CM | POA: Diagnosis not present

## 2022-09-28 DIAGNOSIS — D472 Monoclonal gammopathy: Secondary | ICD-10-CM | POA: Diagnosis not present

## 2022-09-28 DIAGNOSIS — I129 Hypertensive chronic kidney disease with stage 1 through stage 4 chronic kidney disease, or unspecified chronic kidney disease: Secondary | ICD-10-CM | POA: Diagnosis not present

## 2022-09-28 DIAGNOSIS — N1832 Chronic kidney disease, stage 3b: Secondary | ICD-10-CM | POA: Diagnosis not present

## 2022-09-28 DIAGNOSIS — M79604 Pain in right leg: Secondary | ICD-10-CM | POA: Diagnosis not present

## 2022-09-28 DIAGNOSIS — I5042 Chronic combined systolic (congestive) and diastolic (congestive) heart failure: Secondary | ICD-10-CM | POA: Diagnosis not present

## 2022-09-28 DIAGNOSIS — J449 Chronic obstructive pulmonary disease, unspecified: Secondary | ICD-10-CM | POA: Diagnosis not present

## 2022-09-28 DIAGNOSIS — R7303 Prediabetes: Secondary | ICD-10-CM | POA: Diagnosis not present

## 2022-09-28 DIAGNOSIS — E20819 Hypoparathyroidism due to impaired parathyroid hormone secretion, unspecified: Secondary | ICD-10-CM | POA: Diagnosis not present

## 2022-09-28 DIAGNOSIS — D649 Anemia, unspecified: Secondary | ICD-10-CM | POA: Insufficient documentation

## 2022-09-28 DIAGNOSIS — E875 Hyperkalemia: Secondary | ICD-10-CM | POA: Diagnosis not present

## 2022-10-07 ENCOUNTER — Encounter (HOSPITAL_COMMUNITY): Payer: Self-pay | Admitting: *Deleted

## 2022-10-07 ENCOUNTER — Emergency Department (HOSPITAL_COMMUNITY): Payer: Medicare HMO

## 2022-10-07 ENCOUNTER — Inpatient Hospital Stay (HOSPITAL_COMMUNITY)
Admission: EM | Admit: 2022-10-07 | Discharge: 2022-10-17 | DRG: 535 | Disposition: A | Discharge status: Skilled Nursing Facility | Payer: Medicare HMO | Attending: Family Medicine | Admitting: Family Medicine

## 2022-10-07 ENCOUNTER — Other Ambulatory Visit: Payer: Self-pay

## 2022-10-07 DIAGNOSIS — I5022 Chronic systolic (congestive) heart failure: Secondary | ICD-10-CM | POA: Diagnosis present

## 2022-10-07 DIAGNOSIS — M25521 Pain in right elbow: Secondary | ICD-10-CM | POA: Diagnosis not present

## 2022-10-07 DIAGNOSIS — M19041 Primary osteoarthritis, right hand: Secondary | ICD-10-CM | POA: Diagnosis present

## 2022-10-07 DIAGNOSIS — G8929 Other chronic pain: Secondary | ICD-10-CM | POA: Diagnosis present

## 2022-10-07 DIAGNOSIS — Y92009 Unspecified place in unspecified non-institutional (private) residence as the place of occurrence of the external cause: Secondary | ICD-10-CM

## 2022-10-07 DIAGNOSIS — S72001A Fracture of unspecified part of neck of right femur, initial encounter for closed fracture: Secondary | ICD-10-CM

## 2022-10-07 DIAGNOSIS — R4182 Altered mental status, unspecified: Secondary | ICD-10-CM | POA: Diagnosis not present

## 2022-10-07 DIAGNOSIS — Z7189 Other specified counseling: Secondary | ICD-10-CM | POA: Diagnosis not present

## 2022-10-07 DIAGNOSIS — Z743 Need for continuous supervision: Secondary | ICD-10-CM | POA: Diagnosis not present

## 2022-10-07 DIAGNOSIS — Z7901 Long term (current) use of anticoagulants: Secondary | ICD-10-CM

## 2022-10-07 DIAGNOSIS — N1832 Chronic kidney disease, stage 3b: Secondary | ICD-10-CM | POA: Diagnosis present

## 2022-10-07 DIAGNOSIS — I2510 Atherosclerotic heart disease of native coronary artery without angina pectoris: Secondary | ICD-10-CM | POA: Diagnosis not present

## 2022-10-07 DIAGNOSIS — F03918 Unspecified dementia, unspecified severity, with other behavioral disturbance: Secondary | ICD-10-CM | POA: Diagnosis present

## 2022-10-07 DIAGNOSIS — E860 Dehydration: Secondary | ICD-10-CM | POA: Diagnosis not present

## 2022-10-07 DIAGNOSIS — S72111A Displaced fracture of greater trochanter of right femur, initial encounter for closed fracture: Secondary | ICD-10-CM | POA: Diagnosis not present

## 2022-10-07 DIAGNOSIS — Y92017 Garden or yard in single-family (private) house as the place of occurrence of the external cause: Secondary | ICD-10-CM | POA: Diagnosis not present

## 2022-10-07 DIAGNOSIS — I495 Sick sinus syndrome: Secondary | ICD-10-CM | POA: Diagnosis present

## 2022-10-07 DIAGNOSIS — F0393 Unspecified dementia, unspecified severity, with mood disturbance: Secondary | ICD-10-CM | POA: Diagnosis present

## 2022-10-07 DIAGNOSIS — G9341 Metabolic encephalopathy: Secondary | ICD-10-CM | POA: Diagnosis present

## 2022-10-07 DIAGNOSIS — S72114A Nondisplaced fracture of greater trochanter of right femur, initial encounter for closed fracture: Secondary | ICD-10-CM | POA: Diagnosis not present

## 2022-10-07 DIAGNOSIS — Z66 Do not resuscitate: Secondary | ICD-10-CM | POA: Diagnosis not present

## 2022-10-07 DIAGNOSIS — N390 Urinary tract infection, site not specified: Secondary | ICD-10-CM | POA: Diagnosis not present

## 2022-10-07 DIAGNOSIS — D539 Nutritional anemia, unspecified: Secondary | ICD-10-CM | POA: Diagnosis not present

## 2022-10-07 DIAGNOSIS — I509 Heart failure, unspecified: Secondary | ICD-10-CM | POA: Diagnosis not present

## 2022-10-07 DIAGNOSIS — Z8249 Family history of ischemic heart disease and other diseases of the circulatory system: Secondary | ICD-10-CM

## 2022-10-07 DIAGNOSIS — R400 Somnolence: Secondary | ICD-10-CM | POA: Diagnosis not present

## 2022-10-07 DIAGNOSIS — E878 Other disorders of electrolyte and fluid balance, not elsewhere classified: Secondary | ICD-10-CM | POA: Diagnosis not present

## 2022-10-07 DIAGNOSIS — F10131 Alcohol abuse with withdrawal delirium: Secondary | ICD-10-CM | POA: Diagnosis not present

## 2022-10-07 DIAGNOSIS — S72101A Unspecified trochanteric fracture of right femur, initial encounter for closed fracture: Secondary | ICD-10-CM | POA: Diagnosis not present

## 2022-10-07 DIAGNOSIS — M25511 Pain in right shoulder: Secondary | ICD-10-CM | POA: Diagnosis not present

## 2022-10-07 DIAGNOSIS — B3749 Other urogenital candidiasis: Secondary | ICD-10-CM | POA: Diagnosis present

## 2022-10-07 DIAGNOSIS — F05 Delirium due to known physiological condition: Secondary | ICD-10-CM | POA: Diagnosis not present

## 2022-10-07 DIAGNOSIS — E7800 Pure hypercholesterolemia, unspecified: Secondary | ICD-10-CM | POA: Diagnosis present

## 2022-10-07 DIAGNOSIS — Z87891 Personal history of nicotine dependence: Secondary | ICD-10-CM

## 2022-10-07 DIAGNOSIS — Z833 Family history of diabetes mellitus: Secondary | ICD-10-CM

## 2022-10-07 DIAGNOSIS — Z8781 Personal history of (healed) traumatic fracture: Secondary | ICD-10-CM

## 2022-10-07 DIAGNOSIS — R410 Disorientation, unspecified: Secondary | ICD-10-CM | POA: Diagnosis not present

## 2022-10-07 DIAGNOSIS — R69 Illness, unspecified: Secondary | ICD-10-CM | POA: Diagnosis not present

## 2022-10-07 DIAGNOSIS — Y903 Blood alcohol level of 60-79 mg/100 ml: Secondary | ICD-10-CM | POA: Diagnosis not present

## 2022-10-07 DIAGNOSIS — J449 Chronic obstructive pulmonary disease, unspecified: Secondary | ICD-10-CM | POA: Diagnosis not present

## 2022-10-07 DIAGNOSIS — S72009A Fracture of unspecified part of neck of unspecified femur, initial encounter for closed fracture: Secondary | ICD-10-CM | POA: Diagnosis not present

## 2022-10-07 DIAGNOSIS — I10 Essential (primary) hypertension: Secondary | ICD-10-CM | POA: Insufficient documentation

## 2022-10-07 DIAGNOSIS — W19XXXA Unspecified fall, initial encounter: Secondary | ICD-10-CM

## 2022-10-07 DIAGNOSIS — F32A Depression, unspecified: Secondary | ICD-10-CM | POA: Diagnosis not present

## 2022-10-07 DIAGNOSIS — N179 Acute kidney failure, unspecified: Secondary | ICD-10-CM | POA: Diagnosis not present

## 2022-10-07 DIAGNOSIS — W010XXA Fall on same level from slipping, tripping and stumbling without subsequent striking against object, initial encounter: Secondary | ICD-10-CM | POA: Diagnosis not present

## 2022-10-07 DIAGNOSIS — S72001G Fracture of unspecified part of neck of right femur, subsequent encounter for closed fracture with delayed healing: Secondary | ICD-10-CM | POA: Diagnosis not present

## 2022-10-07 DIAGNOSIS — I130 Hypertensive heart and chronic kidney disease with heart failure and stage 1 through stage 4 chronic kidney disease, or unspecified chronic kidney disease: Secondary | ICD-10-CM | POA: Diagnosis present

## 2022-10-07 DIAGNOSIS — I4819 Other persistent atrial fibrillation: Secondary | ICD-10-CM | POA: Diagnosis present

## 2022-10-07 DIAGNOSIS — R4189 Other symptoms and signs involving cognitive functions and awareness: Secondary | ICD-10-CM | POA: Diagnosis present

## 2022-10-07 DIAGNOSIS — M25531 Pain in right wrist: Secondary | ICD-10-CM | POA: Diagnosis not present

## 2022-10-07 DIAGNOSIS — Z9689 Presence of other specified functional implants: Secondary | ICD-10-CM | POA: Diagnosis present

## 2022-10-07 DIAGNOSIS — E870 Hyperosmolality and hypernatremia: Secondary | ICD-10-CM | POA: Diagnosis not present

## 2022-10-07 DIAGNOSIS — S3993XA Unspecified injury of pelvis, initial encounter: Secondary | ICD-10-CM | POA: Diagnosis not present

## 2022-10-07 DIAGNOSIS — Z515 Encounter for palliative care: Secondary | ICD-10-CM | POA: Diagnosis not present

## 2022-10-07 DIAGNOSIS — R0602 Shortness of breath: Secondary | ICD-10-CM | POA: Diagnosis not present

## 2022-10-07 DIAGNOSIS — R52 Pain, unspecified: Secondary | ICD-10-CM | POA: Diagnosis not present

## 2022-10-07 DIAGNOSIS — S50311A Abrasion of right elbow, initial encounter: Secondary | ICD-10-CM | POA: Diagnosis present

## 2022-10-07 DIAGNOSIS — R296 Repeated falls: Secondary | ICD-10-CM | POA: Diagnosis present

## 2022-10-07 DIAGNOSIS — E785 Hyperlipidemia, unspecified: Secondary | ICD-10-CM | POA: Diagnosis not present

## 2022-10-07 DIAGNOSIS — I480 Paroxysmal atrial fibrillation: Secondary | ICD-10-CM | POA: Diagnosis not present

## 2022-10-07 DIAGNOSIS — Z7902 Long term (current) use of antithrombotics/antiplatelets: Secondary | ICD-10-CM

## 2022-10-07 DIAGNOSIS — Z789 Other specified health status: Secondary | ICD-10-CM | POA: Insufficient documentation

## 2022-10-07 DIAGNOSIS — T424X5A Adverse effect of benzodiazepines, initial encounter: Secondary | ICD-10-CM | POA: Diagnosis present

## 2022-10-07 DIAGNOSIS — Z9185 Personal history of military service: Secondary | ICD-10-CM

## 2022-10-07 DIAGNOSIS — Z79899 Other long term (current) drug therapy: Secondary | ICD-10-CM

## 2022-10-07 DIAGNOSIS — T148XXA Other injury of unspecified body region, initial encounter: Secondary | ICD-10-CM | POA: Diagnosis not present

## 2022-10-07 DIAGNOSIS — Z955 Presence of coronary angioplasty implant and graft: Secondary | ICD-10-CM

## 2022-10-07 DIAGNOSIS — R600 Localized edema: Secondary | ICD-10-CM | POA: Diagnosis not present

## 2022-10-07 HISTORY — DX: Sick sinus syndrome: I49.5

## 2022-10-07 HISTORY — DX: Atherosclerotic heart disease of native coronary artery with unstable angina pectoris: I25.110

## 2022-10-07 LAB — CBC WITH DIFFERENTIAL/PLATELET
Abs Immature Granulocytes: 0.05 10*3/uL (ref 0.00–0.07)
Basophils Absolute: 0 10*3/uL (ref 0.0–0.1)
Basophils Relative: 0 %
Eosinophils Absolute: 0.1 10*3/uL (ref 0.0–0.5)
Eosinophils Relative: 1 %
HCT: 32.4 % — ABNORMAL LOW (ref 39.0–52.0)
Hemoglobin: 11.1 g/dL — ABNORMAL LOW (ref 13.0–17.0)
Immature Granulocytes: 1 %
Lymphocytes Relative: 12 %
Lymphs Abs: 1.1 10*3/uL (ref 0.7–4.0)
MCH: 34.4 pg — ABNORMAL HIGH (ref 26.0–34.0)
MCHC: 34.3 g/dL (ref 30.0–36.0)
MCV: 100.3 fL — ABNORMAL HIGH (ref 80.0–100.0)
Monocytes Absolute: 1.1 10*3/uL — ABNORMAL HIGH (ref 0.1–1.0)
Monocytes Relative: 12 %
Neutro Abs: 6.7 10*3/uL (ref 1.7–7.7)
Neutrophils Relative %: 74 %
Platelets: 180 10*3/uL (ref 150–400)
RBC: 3.23 MIL/uL — ABNORMAL LOW (ref 4.22–5.81)
RDW: 13.5 % (ref 11.5–15.5)
WBC: 9 10*3/uL (ref 4.0–10.5)
nRBC: 0 % (ref 0.0–0.2)

## 2022-10-07 LAB — TYPE AND SCREEN
ABO/RH(D): B POS
Antibody Screen: NEGATIVE

## 2022-10-07 LAB — ETHANOL: Alcohol, Ethyl (B): 77 mg/dL — ABNORMAL HIGH (ref <10)

## 2022-10-07 LAB — BASIC METABOLIC PANEL
Anion gap: 8 (ref 5–15)
BUN: 26 mg/dL — ABNORMAL HIGH (ref 8–23)
CO2: 24 mmol/L (ref 22–32)
Calcium: 8.4 mg/dL — ABNORMAL LOW (ref 8.9–10.3)
Chloride: 102 mmol/L (ref 98–111)
Creatinine, Ser: 1.73 mg/dL — ABNORMAL HIGH (ref 0.61–1.24)
GFR, Estimated: 40 mL/min — ABNORMAL LOW (ref >60)
Glucose, Bld: 104 mg/dL — ABNORMAL HIGH (ref 70–99)
Potassium: 4.1 mmol/L (ref 3.5–5.1)
Sodium: 134 mmol/L — ABNORMAL LOW (ref 135–145)

## 2022-10-07 LAB — MAGNESIUM: Magnesium: 1.5 mg/dL — ABNORMAL LOW (ref 1.7–2.4)

## 2022-10-07 MED ORDER — CITALOPRAM HYDROBROMIDE 20 MG PO TABS: 20.0000 mg | ORAL_TABLET | Freq: Every day | ORAL | Status: DC

## 2022-10-07 MED ORDER — AMLODIPINE BESYLATE 5 MG PO TABS
2.5000 mg | ORAL_TABLET | Freq: Every day | ORAL | Status: DC
  Administered 2022-10-08: 2.5 mg via ORAL
  Filled 2022-10-07: qty 1

## 2022-10-07 MED ORDER — AMLODIPINE BESYLATE 5 MG PO TABS: 2.5000 mg | ORAL_TABLET | Freq: Every day | ORAL | Status: DC

## 2022-10-07 MED ORDER — ONDANSETRON HCL 4 MG PO TABS: 4.0000 mg | ORAL_TABLET | Freq: Four times a day (QID) | ORAL | Status: DC | PRN

## 2022-10-07 MED ORDER — LORAZEPAM 2 MG/ML IJ SOLN
0.0000 mg | Freq: Four times a day (QID) | INTRAMUSCULAR | Status: AC
  Administered 2022-10-07: 2 mg via INTRAVENOUS
  Administered 2022-10-08: 1 mg via INTRAVENOUS
  Filled 2022-10-07 (×3): qty 1

## 2022-10-07 MED ORDER — THIAMINE MONONITRATE 100 MG PO TABS
100.0000 mg | ORAL_TABLET | Freq: Every day | ORAL | Status: DC
  Administered 2022-10-07 – 2022-10-17 (×7): 100 mg via ORAL
  Filled 2022-10-07 (×9): qty 1

## 2022-10-07 MED ORDER — CITALOPRAM HYDROBROMIDE 20 MG PO TABS
20.0000 mg | ORAL_TABLET | Freq: Every day | ORAL | Status: DC
  Administered 2022-10-08 – 2022-10-17 (×6): 20 mg via ORAL
  Filled 2022-10-07 (×9): qty 1

## 2022-10-07 MED ORDER — MAGNESIUM SULFATE 2 GM/50ML IV SOLN
2.0000 g | Freq: Once | INTRAVENOUS | Status: AC
  Administered 2022-10-07: 2 g via INTRAVENOUS
  Filled 2022-10-07: qty 50

## 2022-10-07 MED ORDER — HEPARIN SODIUM (PORCINE) 5000 UNIT/ML IJ SOLN
5000.0000 [IU] | Freq: Three times a day (TID) | INTRAMUSCULAR | Status: DC
  Administered 2022-10-07 – 2022-10-08 (×2): 5000 [IU] via SUBCUTANEOUS
  Filled 2022-10-07 (×2): qty 1

## 2022-10-07 MED ORDER — LOSARTAN POTASSIUM 50 MG PO TABS
50.0000 mg | ORAL_TABLET | Freq: Every day | ORAL | Status: DC
  Administered 2022-10-08: 50 mg via ORAL
  Filled 2022-10-07: qty 1

## 2022-10-07 MED ORDER — ATORVASTATIN CALCIUM 40 MG PO TABS
80.0000 mg | ORAL_TABLET | Freq: Every day | ORAL | Status: DC
  Administered 2022-10-08 – 2022-10-17 (×6): 80 mg via ORAL
  Filled 2022-10-07 (×10): qty 2

## 2022-10-07 MED ORDER — MAGNESIUM OXIDE -MG SUPPLEMENT 400 (240 MG) MG PO TABS
400.0000 mg | ORAL_TABLET | Freq: Once | ORAL | Status: AC
  Administered 2022-10-07: 400 mg via ORAL
  Filled 2022-10-07: qty 1

## 2022-10-07 MED ORDER — LORAZEPAM 2 MG/ML IJ SOLN
0.0000 mg | Freq: Two times a day (BID) | INTRAMUSCULAR | Status: DC
  Administered 2022-10-10: 2 mg via INTRAVENOUS
  Filled 2022-10-07: qty 1

## 2022-10-07 MED ORDER — OXYCODONE-ACETAMINOPHEN 5-325 MG PO TABS
1.0000 | ORAL_TABLET | Freq: Once | ORAL | Status: AC
  Administered 2022-10-07: 1 via ORAL
  Filled 2022-10-07: qty 1

## 2022-10-07 MED ORDER — DIGOXIN 125 MCG PO TABS
125.0000 ug | ORAL_TABLET | Freq: Every day | ORAL | Status: DC
  Administered 2022-10-08 – 2022-10-17 (×6): 125 ug via ORAL
  Filled 2022-10-07 (×9): qty 1

## 2022-10-07 MED ORDER — ACETAMINOPHEN 325 MG PO TABS: 650.0000 mg | ORAL_TABLET | Freq: Four times a day (QID) | ORAL | Status: DC | PRN

## 2022-10-07 MED ORDER — DIGOXIN 125 MCG PO TABS: 125.0000 ug | ORAL_TABLET | Freq: Every day | ORAL | Status: DC

## 2022-10-07 MED ORDER — TAMSULOSIN HCL 0.4 MG PO CAPS
0.4000 mg | ORAL_CAPSULE | Freq: Every day | ORAL | Status: DC
  Administered 2022-10-08 – 2022-10-17 (×6): 0.4 mg via ORAL
  Filled 2022-10-07 (×10): qty 1

## 2022-10-07 MED ORDER — OXYCODONE HCL 5 MG PO TABS
5.0000 mg | ORAL_TABLET | ORAL | Status: DC | PRN
  Administered 2022-10-08: 5 mg via ORAL
  Filled 2022-10-07: qty 1

## 2022-10-07 MED ORDER — THIAMINE HCL 100 MG/ML IJ SOLN
100.0000 mg | Freq: Every day | INTRAMUSCULAR | Status: DC
  Administered 2022-10-10 – 2022-10-11 (×2): 100 mg via INTRAVENOUS
  Filled 2022-10-07 (×2): qty 2

## 2022-10-07 MED ORDER — METOPROLOL SUCCINATE ER 25 MG PO TB24
12.5000 mg | ORAL_TABLET | Freq: Two times a day (BID) | ORAL | Status: DC
  Administered 2022-10-08: 12.5 mg via ORAL
  Filled 2022-10-07 (×2): qty 1

## 2022-10-07 MED ORDER — ONDANSETRON HCL 4 MG/2ML IJ SOLN
4.0000 mg | Freq: Four times a day (QID) | INTRAMUSCULAR | Status: DC | PRN
  Administered 2022-10-08: 4 mg via INTRAVENOUS
  Filled 2022-10-07: qty 2

## 2022-10-07 MED ORDER — ATORVASTATIN CALCIUM 40 MG PO TABS: 80.0000 mg | ORAL_TABLET | Freq: Every day | ORAL | Status: DC

## 2022-10-07 MED ORDER — LORAZEPAM 1 MG PO TABS: 0.0000 mg | ORAL_TABLET | Freq: Four times a day (QID) | ORAL | Status: AC

## 2022-10-07 MED ORDER — ACETAMINOPHEN 650 MG RE SUPP: 650.0000 mg | Freq: Four times a day (QID) | RECTAL | Status: DC | PRN

## 2022-10-07 MED ORDER — LOSARTAN POTASSIUM 25 MG PO TABS: 50.0000 mg | ORAL_TABLET | Freq: Every day | ORAL | Status: DC

## 2022-10-07 MED ORDER — MORPHINE SULFATE (PF) 2 MG/ML IV SOLN
2.0000 mg | INTRAVENOUS | Status: DC | PRN
  Administered 2022-10-08 – 2022-10-10 (×8): 2 mg via INTRAVENOUS
  Filled 2022-10-07 (×9): qty 1

## 2022-10-07 MED ORDER — SODIUM CHLORIDE 0.9 % IV BOLUS
1000.0000 mL | Freq: Once | INTRAVENOUS | Status: AC
  Administered 2022-10-07: 1000 mL via INTRAVENOUS

## 2022-10-07 MED ORDER — LORAZEPAM 1 MG PO TABS: 0.0000 mg | ORAL_TABLET | Freq: Two times a day (BID) | ORAL | Status: DC

## 2022-10-07 NOTE — ED Notes (Signed)
Pt placed on bedpan to have BM-call bell in hand

## 2022-10-07 NOTE — ED Notes (Signed)
Telfa, guaze and kerlix applied to skin tears to right arm; pt and family member instructed on how to apply dressing; they verbalized understanding

## 2022-10-07 NOTE — ED Notes (Signed)
Pt gone to xray

## 2022-10-07 NOTE — Assessment & Plan Note (Signed)
-  Per daughter she is not very concerned about his alcohol consumption--mainly confusion and dementia -EtOH 55 -CIWA -Continue to monitor -Pleasantly confused now, no signs of withdrawal yet

## 2022-10-07 NOTE — Assessment & Plan Note (Signed)
Continue statin. 

## 2022-10-07 NOTE — H&P (Signed)
History and Physical    Patient: Ricardo Garrett:[id number] DOB: [birth year] DOA: 10/07/2022 DOS: the patient was seen and examined on 10/07/2022 PCP: [name], MD  Patient coming from: Home  Chief Complaint:  Chief Complaint  Patient presents with   Fall   HPI: Ricardo Garrett is a 76 y.o. male with medical history significant of arthritis, coronary artery disease, CHF, atrial fibrillation, hypertension, alcohol use, presents to the ED with a chief complaint of mechanical fall.  Patient is unfortunately not able to provide any history to me.  Per chart review he reported that he was walking his dog when he had a mechanical fall.  He reports that he fell because he was trying to move too quickly after the dog made him angry.  He landed on the side of his body.  His home health nurse arrived an hour later and called his daughter and they decided to bring him into the ED.  He was complaining of pain in his right hip and he was found to have a hip fracture.  Patient is on Eliquis and Plavix and Ortho requested that we hold those.   Review of Systems: unable to review all systems due to the inability of the patient to answer questions. Past Medical History:  Diagnosis Date   Arthritis    "finger on right hand" (11/23/2017)   Broken arm ~ 1952   "no OR; just reset it; not sure which side"   CAD (coronary artery disease)    CHF (congestive heart failure) ([place])    a. 12/2015: echo showing reduced EF of 35-40% b. NST: 09/2017: scar with peri-infarct ischemia, intermediate-risk study   COPD (chronic obstructive pulmonary disease) ([place])    "was on RX when he had CHF; not on anything now" (11/23/2017)   High cholesterol    HOH (hard of hearing)    Hypertension    Persistent atrial fibrillation ([place])    a. s/p DCCV in 03/2016   Past Surgical History:  Procedure Laterality Date   CARDIAC CATHETERIZATION  11/23/2017   CARDIOVERSION N/A 04/05/2016   Procedure: CARDIOVERSION;  Surgeon: Josue Hector, MD;  Location: [facility];  Service: Cardiovascular;  Laterality: N/A;   COMPRESSION HIP SCREW Right 03/15/2013   Procedure: COMPRESSION HIP;  Surgeon: Sanjuana Kava, MD;  Location: [facility];  Service: Orthopedics;  Laterality: Right;   CORONARY ATHERECTOMY N/A 11/24/2017   Procedure: CORONARY ATHERECTOMY;  Surgeon: Burnell Blanks, MD;  Location: [place] [facility];  Service: Cardiovascular;  Laterality: N/A;   CORONARY BALLOON ANGIOPLASTY N/A 11/24/2017   Procedure: CORONARY BALLOON ANGIOPLASTY;  Surgeon: Burnell Blanks, MD;  Location: [place] [facility];  Service: Cardiovascular;  Laterality: N/A;   CORONARY STENT INTERVENTION N/A 11/24/2017   Procedure: CORONARY STENT INTERVENTION;  Surgeon: Burnell Blanks, MD;  Location: [place] [facility];  Service: Cardiovascular;  Laterality: N/A;   FRACTURE SURGERY     PACEMAKER IMPLANT N/A 11/27/2017   Procedure: PACEMAKER IMPLANT;  Surgeon: Evans Lance, MD;  Location: [place] [facility];  Service: Cardiovascular;  Laterality: N/A;   RIGHT/LEFT HEART CATH AND CORONARY ANGIOGRAPHY N/A 11/23/2017   Procedure: RIGHT/LEFT HEART CATH AND CORONARY ANGIOGRAPHY;  Surgeon: Burnell Blanks, MD;  Location: [place] [facility];  Service: Cardiovascular;  Laterality: N/A;   SKIN GRAFT Left    from right thigh   TONSILLECTOMY     Social History:  reports that he quit smoking about 42 years  ago. His smoking use included cigarettes. He has a 20.00 pack-year smoking history. He has never used smokeless tobacco. He reports current alcohol use. He reports that he does not currently use drugs after having used the following drugs: Marijuana.  No Known Allergies  Family History  Problem Relation Age of Onset   Diabetes Mother    Heart attack Father     Prior to Admission medications   Medication Sig Start Date End Date Taking? Authorizing Provider  amLODipine (NORVASC) 2.5 MG tablet Take 2.5 mg by mouth daily.   Yes  [provider]  apixaban (ELIQUIS) 2.5 MG TABS tablet Take 1 tablet (2.5 mg total) by mouth 2 (two) times daily. 05/20/19  Yes Josue Hector, MD  atorvastatin (LIPITOR) 80 MG tablet Take 1 tablet (80 mg total) by mouth daily. 05/02/19  Yes Josue Hector, MD  clopidogrel (PLAVIX) 75 MG tablet Take 1 tablet by mouth once daily 03/31/20  Yes Josue Hector, MD  diclofenac Sodium (VOLTAREN) 1 % GEL Apply 2 g topically 4 (four) times daily.   Yes [provider]  digoxin (LANOXIN) 0.125 MG tablet Take 1 tablet (125 mcg total) by mouth daily. 05/02/19  Yes Josue Hector, MD  escitalopram (LEXAPRO) 5 MG tablet Take 5 mg by mouth daily.   Yes [provider]  furosemide (LASIX) 40 MG tablet Take 1 tablet (40 mg total) by mouth daily. 05/02/19  Yes Josue Hector, MD  HYDROcodone-acetaminophen (NORCO) 7.5-325 MG tablet Take 1 tablet by mouth 2 (two) times daily.   Yes [provider]  LOKELMA 10 g PACK packet Take 1 packet by mouth daily. 06/03/21  Yes [provider]  losartan (COZAAR) 50 MG tablet Take 1 tablet by mouth daily. 11/13/17  Yes [provider]  metoprolol succinate (TOPROL-XL) 25 MG 24 hr tablet TAKE 1/2 (ONE-HALF) TABLET BY MOUTH IN THE MORNING AND 1 TABLET IN THE EVENING 05/02/19  Yes Josue Hector, MD  tamsulosin (FLOMAX) 0.4 MG CAPS capsule Take 0.4 mg by mouth daily. 10/08/20  Yes [provider]  amLODipine (NORVASC) 5 MG tablet Take 5 mg by mouth daily. Patient not taking: Reported on 10/07/2022 05/11/22   [provider]    Physical Exam: Vitals:   10/07/22 2230 10/07/22 2300 10/07/22 2321 10/07/22 2330  BP: (!) 142/79 (!) 149/80  (!) 162/86  Pulse: (!) 54  85 64  Resp: '17  16 15  '$ Temp:      TempSrc:      SpO2: 98%  95% 100%  Weight:      Height:       1.  General: Patient lying supine in bed,  no acute distress   2. Psychiatric: Hypersomnolence following ativan, not following commands or answering  questions   3. Neurologic: Hypersomnolence, not following commands or answering questions   4. HEENMT:  Head is atraumatic, normocephalic, pupils reactive to light, neck is supple, trachea is midline, mucous membranes are moist   5. Respiratory : Lungs are clear to auscultation bilaterally without wheezing, rhonchi, rales, no cyanosis, no increase in work of breathing or accessory muscle use   6. Cardiovascular : Heart rate normal, rhythm is regular, no murmurs, rubs or gallops, no peripheral edema, peripheral pulses palpated   7. Gastrointestinal:  Abdomen is soft, nondistended, nontender to palpation bowel sounds active, no masses or organomegaly palpated   8. Skin:  Skin is warm, dry and intact without rashes, acute lesions, or ulcers on  limited exam   9.Musculoskeletal:  No acute deformities or trauma, no asymmetry in tone, no peripheral edema, peripheral pulses palpated, no tenderness to palpation in the extremities  Data Reviewed: Temp 98, heart rate 58-61, respiratory 16-18, blood pressure 106/69-147/79, satting at 100% No leukocytosis with white blood cell count of 9.0, hemoglobin 11.1, platelets 180 Chemistry is unremarkable aside from an elevated creatinine which is near to baseline with a previous creatinine being 1.81 and before that 1.71 Alcohol level 77 CT right hip shows acute mildly comminuted fracture of right greater trochanter encompassing the attachment sites of the gluteus medius and minimus.  Fracture plane extends down to upper margin of plate but does not appear to extend further along the prosthetic Patient was started on CIWA protocol given thiamine, 1 L bolus, Percocet Unfortunately patient was given Ativan via CIWA and was not able to provide any history at the time of my exam  Assessment and Plan: * Hip fracture ([place]) -CT right hip shows acute mildly comminuted fracture of the greater trochanter in ipsilateral side as previous hip surgery  -Ortho  consulted, rec's holding anticoagulation and they will see in the AM  -type and screen -Ua pending -Continue to monitor  Fall at home, initial encounter -Described as a mechanical fall to ED staff -Resulting hip fracture -Continue to monitor  HLD (hyperlipidemia) -Continue statin  Essential hypertension -Continue norvasc, losartan and metoprolol  Paroxysmal atrial fibrillation (HCC) -Continue digoxin and metoprolol -Holding anticoagulation for possible surgery -continue to monitor  Hypomagnesemia -Mag 1.5 -mag sulfate 2 g repletion -Trend in the AM  Alcohol use -EtOH 77 -CIWA -Continue to monitor      Advance Care Planning:   Code Status: Full Code  Consults: Ortho  Family Communication: No family at bedside  Severity of Illness: The appropriate patient status for this patient is INPATIENT. Inpatient status is judged to be reasonable and necessary in order to provide the required intensity of service to ensure the patient's safety. The patient's presenting symptoms, physical exam findings, and initial radiographic and laboratory data in the context of their chronic comorbidities is felt to place them at high risk for further clinical deterioration. Furthermore, it is not anticipated that the patient will be medically stable for discharge from the hospital within 2 midnights of admission.   * I certify that at the point of admission it is my clinical judgment that the patient will require inpatient hospital care spanning beyond 2 midnights from the point of admission due to high intensity of service, high risk for further deterioration and high frequency of surveillance required.*  Author: Rolla Plate, DO 10/07/2022 11:55 PM  For on call review [email or web address].

## 2022-10-07 NOTE — ED Notes (Signed)
Attempted to walk pt with 2 man assist but pt c/o severe pain and was not able to walk; [place] PA informed

## 2022-10-07 NOTE — Assessment & Plan Note (Signed)
-  Described as a mechanical fall to ED staff -Resulting hip fracture -Continue to monitor  -PT/OT

## 2022-10-07 NOTE — ED Provider Notes (Signed)
[facility] EMERGENCY DEPARTMENT Provider Note   CSN: [id number] Arrival date & time: 10/07/22  1413     History  Chief Complaint  Patient presents with   Ricardo Garrett is a 76 y.o. male with a past medical history of A-fib, electrolyte abnormality, CKD and COPD presenting today after a fall.  He reports that he was walking outside with his dog who kept getting tangled up and the dog "pissed him off" and he moved too quickly to reach for the dog and subsequently fell onto the right side of his body.  He was unable to stand up on his own inject himself inside.  His home health nurse arrived an hour later and called his daughter to say that he had fallen and they brought him here.  He is complaining of pain in his right hip.  He has a history of intertrochanteric fracture of this hip status post repair in 2014 with Dr. Luna Glasgow.  He denies a seizure history.  Does report a history of A-fib but says that this was a purely mechanical fall.  Denies any dizziness or syncope.  He is on Eliquis and Plavix for his A-fib but denies striking his head.   Fall       Home Medications Prior to Admission medications   Medication Sig Start Date End Date Taking? Authorizing Provider  amLODipine (NORVASC) 5 MG tablet Take 5 mg by mouth daily. 05/11/22   [provider]  apixaban (ELIQUIS) 2.5 MG TABS tablet Take 1 tablet (2.5 mg total) by mouth 2 (two) times daily. 05/20/19   Josue Hector, MD  atorvastatin (LIPITOR) 80 MG tablet Take 1 tablet (80 mg total) by mouth daily. 05/02/19   Josue Hector, MD  clopidogrel (PLAVIX) 75 MG tablet Take 1 tablet by mouth once daily 03/31/20   Josue Hector, MD  diclofenac Sodium (VOLTAREN) 1 % GEL diclofenac 1 % topical gel  APPLY 2 GRAMS TO THE AFFECTED AREA 4 TIMES PER DAY    [provider]  digoxin (LANOXIN) 0.125 MG tablet Take 1 tablet (125 mcg total) by mouth daily. 05/02/19   Josue Hector, MD  escitalopram (LEXAPRO) 5 MG tablet  Take 5 mg by mouth daily.    [provider]  furosemide (LASIX) 40 MG tablet Take 1 tablet (40 mg total) by mouth daily. 05/02/19   Josue Hector, MD  HYDROcodone-acetaminophen (NORCO) 7.5-325 MG tablet hydrocodone 7.5 mg-acetaminophen 325 mg tablet  TAKE ONE TABLET BY MOUTH twice daily    [provider]  lisinopril (ZESTRIL) 40 MG tablet Take 40 mg by mouth daily. 04/06/20   [provider]  LOKELMA 10 g PACK packet Take 1 packet by mouth daily. 06/03/21   [provider]  losartan (COZAAR) 50 MG tablet Take 1 tablet by mouth daily. 11/13/17   [provider]  metoprolol succinate (TOPROL-XL) 25 MG 24 hr tablet TAKE 1/2 (ONE-HALF) TABLET BY MOUTH IN THE MORNING AND 1 TABLET IN THE EVENING 05/02/19   Josue Hector, MD  tamsulosin (FLOMAX) 0.4 MG CAPS capsule Take 0.4 mg by mouth daily. 10/08/20   [provider]  triamcinolone cream (KENALOG) 0.1 % SMARTSIG:Sparingly Topical Twice Daily 06/08/21   [provider]      Allergies    Patient has no known allergies.    Review of Systems   Review of Systems  Physical Exam Updated Vital Signs BP 106/69 (BP Location: Left Arm)  Pulse 60   Temp 98 F (36.7 C) (Oral)   Resp 18   Ht '5\' 8"'$  (1.727 m)   Wt 52.2 kg   SpO2 100%   BMI 17.50 kg/m  Physical Exam Vitals and nursing note reviewed.  Constitutional:      General: He is not in acute distress.    Appearance: Normal appearance. He is not ill-appearing.  HENT:     Head: Normocephalic and atraumatic.  Eyes:     General: No scleral icterus.    Conjunctiva/sclera: Conjunctivae normal.  Pulmonary:     Effort: Pulmonary effort is normal. No respiratory distress.  Musculoskeletal:     Comments: Able to flex right hip very mildly.  Normal DP pulse bilaterally.  No obvious deformity.  TTP over proximal femur  Skin:    General: Skin is warm and dry.     Findings: No rash.     Comments: Abrasion and skin tear over the right elbow   Neurological:     Mental Status: He is alert.  Psychiatric:        Mood and Affect: Mood normal.     Comments: Very hard of hearing but able to provide his own history     ED Results / Procedures / Treatments   Labs (all labs ordered are listed, but only abnormal results are displayed) Labs Reviewed - No data to display  EKG None  Radiology DG Elbow Complete Right  Result Date: 10/07/2022 CLINICAL DATA:  Right elbow pain after fall. EXAM: RIGHT ELBOW - COMPLETE 3+ VIEW COMPARISON:  None Available. FINDINGS: There is no evidence of fracture, dislocation, or joint effusion. There is no evidence of arthropathy or other focal bone abnormality. Soft tissues are unremarkable. IMPRESSION: Negative. Electronically Signed   By: Marijo Conception M.D.   On: 10/07/2022 15:55   DG Pelvis 1-2 Views  Result Date: 10/07/2022 CLINICAL DATA:  Fall. EXAM: PELVIS - 1-2 VIEW COMPARISON:  June 17, 2021. FINDINGS: Status post surgical internal fixation of old proximal right femoral fracture. No acute fracture or dislocation is noted. IMPRESSION: No acute abnormality seen. Electronically Signed   By: Marijo Conception M.D.   On: 10/07/2022 15:54    Procedures Procedures   Medications Ordered in ED Medications  LORazepam (ATIVAN) injection 0-4 mg (has no administration in time range)    Or  LORazepam (ATIVAN) tablet 0-4 mg (has no administration in time range)  LORazepam (ATIVAN) injection 0-4 mg (has no administration in time range)    Or  LORazepam (ATIVAN) tablet 0-4 mg (has no administration in time range)  thiamine (VITAMIN B1) tablet 100 mg (has no administration in time range)    Or  thiamine (VITAMIN B1) injection 100 mg (has no administration in time range)  sodium chloride 0.9 % bolus 1,000 mL (has no administration in time range)  magnesium oxide (MAG-OX) tablet 400 mg (has no administration in time range)  oxyCODONE-acetaminophen (PERCOCET/ROXICET) 5-325 MG per tablet 1 tablet (1 tablet  Oral Given 10/07/22 1633)    ED Course/ Medical Decision Making/ A&P                           Medical Decision Making Amount and/or Complexity of Data Reviewed Labs: ordered. Radiology: ordered.  Risk OTC drugs. Prescription drug management. Decision regarding hospitalization.   76 year old male presenting today after a fall.  Says it is purely mechanical.  Differential for the fall includes but is not  limited to arrhythmia, seizure, syncope, CVA, hypoglycemia, orthostasis, vasovagal, intoxication.  This is not an exhaustive differential.     Past Medical History / Co-morbidities / Social History: A-fib on Plavix and Eliquis, CKD, CHF, alcohol use   Additional history: I reviewed patient's previous hospitalizations for alcohol use, hyperkalemia and hyponatremia.   I spoke with patient's daughter for a long time in the hallway.  I discussed that often times when elderly people fall I do a further workup such as labs, EKG, head CT, EDC.  She says that they recently had labs with PCP and she knows that he needs to be careful not to eat too much potassium.  She says that she also has cameras that she can review and she trusts that he actually had a mechanical fall.  She does not believe further workup is necessary at this time.  Patient is adamant that he had a mechanical fall and does not need further workup at this time.  Daughter reports he was not drinking alcohol prior to his fall and that the dog often times gets in his way and tripped him up.   Physical Exam: Pertinent physical exam findings include Superficial abrasion on the right elbow.  Bleeding controlled. Mild TTP of proximal femur  Lab Tests:  1.5 magnesium 77 ethanol, put on CIWA precaution Macrocytic anemia as expected with alcohol use Kidney function baseline   Imaging Studies: I ordered, viewed and interpreted patient's x-ray of his right elbow and pelvic x-ray.  I agree with radiology that there are no  fractures.  Does not appear to have a periprosthetic fracture of his right hip   Medications: Percocet with mild relief.  Consultations Obtained: I spoke with Dr. Amedeo Kinsman with orthosurg and he recommends admission and he will see the patient tomorrow.  Patient needs to be 48 hours free of Eliquis and Plavix prior to surgery.  He does not need to be n.p.o. at this time and Ortho will see him tomorrow.  MDM/Disposition: This is a 76 year old male who presented today after a fall.  He endorses appear mechanical fall although he does have a history of electrolyte abnormalities, A-fib, heart failure and hypertension.  He endorses a purely mechanical fall earlier today.  X-ray negative.  CT scan was ordered because patient was unable to ambulate and revealed an acute comminuted fracture of the right trochanter.  Orthopedic surgery was consulted and they recommended admission to the hospitalist.    Final Clinical Impression(s) / ED Diagnoses Final diagnoses:  Closed fracture of trochanter of right femur, initial encounter [facility])    Rx / DC Orders ED Discharge Orders     None      Admit to Dr. Bunnie Pion, Arti Trang A, PA-C 10/07/22 2040    Elgie Congo, MD 10/08/22 [phone]

## 2022-10-07 NOTE — Assessment & Plan Note (Signed)
-  CT right hip shows acute mildly comminuted fracture of the greater trochanter in ipsilateral side as previous hip surgery  -Ortho consulted, rec's holding anticoagulation and they will see in the AM  -type and screen -Ua pending -Continue to monitor

## 2022-10-07 NOTE — ED Triage Notes (Signed)
Pt brought in by [facility] for c/o fall that occurred today; pt fell while outside and was able to crawl back in the house  Pt has skin tear to right arm and upper arm  Right hip has some outward rotation to right hip with shortening

## 2022-10-07 NOTE — Assessment & Plan Note (Addendum)
-  Continue digoxin and metoprolol -Will resume his Eliquis, will hold Plavix -continue to monitor

## 2022-10-07 NOTE — Assessment & Plan Note (Signed)
-  Continue norvasc, losartan and metoprolol -As needed hydralazine

## 2022-10-07 NOTE — Assessment & Plan Note (Signed)
-  Mag was 1.5 -Was repleted -Monitor magnesium level

## 2022-10-07 NOTE — ED Notes (Signed)
Pt gone to CT 

## 2022-10-07 NOTE — ED Notes (Signed)
Pt pulling at medical equipment. Pt placed in safety mitts for pt's safety. RN will continue care

## 2022-10-08 ENCOUNTER — Encounter (HOSPITAL_COMMUNITY): Payer: Self-pay | Admitting: Family Medicine

## 2022-10-08 DIAGNOSIS — S72114A Nondisplaced fracture of greater trochanter of right femur, initial encounter for closed fracture: Secondary | ICD-10-CM | POA: Diagnosis not present

## 2022-10-08 DIAGNOSIS — S72001G Fracture of unspecified part of neck of right femur, subsequent encounter for closed fracture with delayed healing: Secondary | ICD-10-CM | POA: Diagnosis not present

## 2022-10-08 LAB — COMPREHENSIVE METABOLIC PANEL
ALT: 13 U/L (ref 0–44)
AST: 17 U/L (ref 15–41)
Albumin: 3.2 g/dL — ABNORMAL LOW (ref 3.5–5.0)
Alkaline Phosphatase: 91 U/L (ref 38–126)
Anion gap: 7 (ref 5–15)
BUN: 25 mg/dL — ABNORMAL HIGH (ref 8–23)
CO2: 22 mmol/L (ref 22–32)
Calcium: 8 mg/dL — ABNORMAL LOW (ref 8.9–10.3)
Chloride: 106 mmol/L (ref 98–111)
Creatinine, Ser: 1.43 mg/dL — ABNORMAL HIGH (ref 0.61–1.24)
GFR, Estimated: 50 mL/min — ABNORMAL LOW (ref >60)
Glucose, Bld: 93 mg/dL (ref 70–99)
Potassium: 3.7 mmol/L (ref 3.5–5.1)
Sodium: 135 mmol/L (ref 135–145)
Total Bilirubin: 1.1 mg/dL (ref 0.3–1.2)
Total Protein: 5.8 g/dL — ABNORMAL LOW (ref 6.5–8.1)

## 2022-10-08 LAB — CBC WITH DIFFERENTIAL/PLATELET
Abs Immature Granulocytes: 0.04 10*3/uL (ref 0.00–0.07)
Basophils Absolute: 0 10*3/uL (ref 0.0–0.1)
Basophils Relative: 1 %
Eosinophils Absolute: 0.1 10*3/uL (ref 0.0–0.5)
Eosinophils Relative: 1 %
HCT: 33.4 % — ABNORMAL LOW (ref 39.0–52.0)
Hemoglobin: 11.2 g/dL — ABNORMAL LOW (ref 13.0–17.0)
Immature Granulocytes: 1 %
Lymphocytes Relative: 13 %
Lymphs Abs: 0.7 10*3/uL (ref 0.7–4.0)
MCH: 33.7 pg (ref 26.0–34.0)
MCHC: 33.5 g/dL (ref 30.0–36.0)
MCV: 100.6 fL — ABNORMAL HIGH (ref 80.0–100.0)
Monocytes Absolute: 0.8 10*3/uL (ref 0.1–1.0)
Monocytes Relative: 14 %
Neutro Abs: 4 10*3/uL (ref 1.7–7.7)
Neutrophils Relative %: 70 %
Platelets: 166 10*3/uL (ref 150–400)
RBC: 3.32 MIL/uL — ABNORMAL LOW (ref 4.22–5.81)
RDW: 13.2 % (ref 11.5–15.5)
WBC: 5.6 10*3/uL (ref 4.0–10.5)
nRBC: 0 % (ref 0.0–0.2)

## 2022-10-08 LAB — URINALYSIS, ROUTINE W REFLEX MICROSCOPIC
Bacteria, UA: NONE SEEN
Bilirubin Urine: NEGATIVE
Glucose, UA: NEGATIVE mg/dL
Hgb urine dipstick: NEGATIVE
Ketones, ur: NEGATIVE mg/dL
Leukocytes,Ua: NEGATIVE
Nitrite: NEGATIVE
Protein, ur: >=300 mg/dL — AB
Specific Gravity, Urine: 1.011 (ref 1.005–1.030)
pH: 5 (ref 5.0–8.0)

## 2022-10-08 LAB — MAGNESIUM: Magnesium: 2.2 mg/dL (ref 1.7–2.4)

## 2022-10-08 MED ORDER — AMLODIPINE BESYLATE 5 MG PO TABS
5.0000 mg | ORAL_TABLET | Freq: Every day | ORAL | Status: DC
  Administered 2022-10-08: 5 mg via ORAL
  Filled 2022-10-08: qty 1

## 2022-10-08 MED ORDER — SODIUM CHLORIDE 0.9 % IV SOLN: INTRAVENOUS | Status: AC

## 2022-10-08 MED ORDER — APIXABAN 2.5 MG PO TABS
2.5000 mg | ORAL_TABLET | Freq: Two times a day (BID) | ORAL | Status: DC
  Administered 2022-10-08 – 2022-10-17 (×12): 2.5 mg via ORAL
  Filled 2022-10-08 (×16): qty 1

## 2022-10-08 NOTE — Progress Notes (Signed)
PROGRESS NOTE    Patient: Ricardo Garrett                            PCP: [name], MD                    DOB: 1946-08-25            DOA: 10/07/2022 VOH:[id number]             DOS: 10/08/2022, 12:03 PM   LOS: 1 day   Date of Service: The patient was seen and examined on 10/08/2022  Subjective:   The patient was seen and examined this morning. Stable at this time. Still complaining of right hip pain, pleasantly confused   Brief Narrative:   Ricardo Garrett is a 76 y.o. male with medical history significant of arthritis, coronary artery disease, CHF, atrial fibrillation, hypertension, alcohol use, presents to the ED with a chief complaint of mechanical fall.  Patient is unfortunately not able to provide any history to me.  Per chart review he reported that he was walking his dog when he had a mechanical fall.  He reported to ED that he fell because he was trying to move too quickly after the dog made him angry.  He landed on the side of his body.  His home health nurse arrived an hour later and called his daughter and they decided to bring him into the ED.  He was complaining of pain in his right hip and he was found to have a hip fracture.  Patient is on Eliquis and Plavix and Ortho requested that we hold those.  Ed : Temp 98, heart rate 58-61, RR 16-18, BP 106/69-147/79, satting at 100% No leukocytosis with WBC 9.0, hemoglobin 11.1, platelets 180 Chemistry is unremarkable aside from an elevated creatinine which is near to baseline with a previous creatinine being 1.81 and before that 1.71 Alcohol level 77 CT right hip shows acute mildly comminuted fracture of right greater trochanter encompassing the attachment sites of the gluteus medius and minimus.  Fracture plane extends down to upper margin of plate but does not appear to extend further along the prosthetic Patient was started on CIWA protocol given thiamine, 1 L bolus, Percocet Unfortunately patient was given Ativan via CIWA  and was not able to provide any history    Assessment & Plan:   Principal Problem:   Hip fracture ([place]) Active Problems:   Alcohol use   Hypomagnesemia   Paroxysmal atrial fibrillation (HCC)   Essential hypertension   HLD (hyperlipidemia)   Fall at home, initial encounter     Assessment and Plan: * Hip fracture ([place]) -CT right hip shows acute mildly comminuted fracture of the greater trochanter in ipsilateral side as previous hip surgery  -Ortho consulted,  - on chronic meds-  anticoagulation-will resume Eliquis, will hold his Plavix In case of urgent deterioration, surgical intervention.  -Per Dr. Sylvester Harder- orthopedic, patient is not at surgical candidate at this point Recommending weightbearing as tolerated, as tolerated using walker with limited hip extension and abduction -Anticipating reevaluation and and x-rays  Fall at home, initial encounter -Described as a mechanical fall to ED staff -Resulting hip fracture -Continue to monitor  -PT/OT  HLD (hyperlipidemia) -Continue statin  Essential hypertension -Continue norvasc, losartan and metoprolol -As needed hydralazine  Paroxysmal atrial fibrillation (HCC) -Continue digoxin and metoprolol -Will resume his Eliquis, will hold Plavix -continue to monitor  Hypomagnesemia -  Mag 1.5 -mag sulfate 2 g repletion -Monitor magnesium level  Alcohol use -EtOH 77 -CIWA -Continue to monitor -Pleasantly confused now, no signs of withdrawal yet       ------------------------------------------------------------------------------------------------------------------------------------------------  DVT prophylaxis:  heparin injection 5,000 Units Start: 10/07/22 2200 SCDs Start: 10/07/22 2054   Code Status:   Code Status: Full Code  Family Communication: No family member present at bedside- attempt will be made to update daily The above findings and plan of care has been discussed with patient (and family)  in detail,   they expressed understanding and agreement of above. -Advance care planning has been discussed.   Admission status:   Status is: Inpatient Remains inpatient appropriate because: Needing close evaluation, IV fluids, orthopedic evaluation, pain management     Procedures:   No admission procedures for hospital encounter.   Antimicrobials:  Anti-infectives (From admission, onward)    None        Medication:   amLODipine  2.5 mg Oral Daily   atorvastatin  80 mg Oral Daily   citalopram  20 mg Oral Daily   digoxin  125 mcg Oral Daily   heparin  5,000 Units Subcutaneous Q8H   LORazepam  0-4 mg Intravenous Q6H   Or   LORazepam  0-4 mg Oral Q6H   [START ON 10/10/2022] LORazepam  0-4 mg Intravenous Q12H   Or   [START ON 10/10/2022] LORazepam  0-4 mg Oral Q12H   losartan  50 mg Oral Daily   metoprolol succinate  12.5 mg Oral BID   tamsulosin  0.4 mg Oral Daily   thiamine  100 mg Oral Daily   Or   thiamine  100 mg Intravenous Daily    acetaminophen **OR** acetaminophen, morphine injection, ondansetron **OR** ondansetron (ZOFRAN) IV, oxyCODONE   Objective:   Vitals:   10/08/22 0845 10/08/22 0930 10/08/22 0955 10/08/22 1030  BP:  (!) 164/64  (!) 144/92  Pulse: 67 74  71  Resp: 19 (!) 21  20  Temp:   98.3 F (36.8 C) 97.9 F (36.6 C)  TempSrc:   Oral Oral  SpO2: 100% 97%  95%  Weight:    56.7 kg  Height:        Intake/Output Summary (Last 24 hours) at 10/08/2022 1203 Last data filed at 10/08/2022 0053 Gross per 24 hour  Intake 1050 ml  Output --  Net 1050 ml   Filed Weights   10/07/22 1431 10/08/22 1030  Weight: 52.2 kg 56.7 kg     Examination:   Physical Exam  Constitution:  Alert, cooperative, no distress,  Appears calm and comfortable  Psychiatric:   Normal and stable mood and affect, cognition intact,   HEENT:        Normocephalic, PERRL, otherwise with in Normal limits  Chest:         Chest symmetric Cardio vascular:  S1/S2, RRR, No murmure, No  Rubs or Gallops  pulmonary: Clear to auscultation bilaterally, respirations unlabored, negative wheezes / crackles Abdomen: Soft, non-tender, non-distended, bowel sounds,no masses, no organomegaly Muscular skeletal: Right hip pain, therefore limited range of motion of right lower extremity Limited exam - in bed, able to move all 4 extremities,   Neuro: CNII-XII intact. , normal motor and sensation, reflexes intact  Extremities: No pitting edema lower extremities, +2 pulses  Skin: Dry, warm to touch, negative for any Rashes, No open wounds Wounds: per nursing documentation   ------------------------------------------------------------------------------------------------------------------------------------------    LABs:     Latest Ref Rng & Units  10/08/2022    4:37 AM 10/07/2022    7:40 PM 09/09/2022    1:23 PM  CBC  WBC 4.0 - 10.5 K/uL 5.6  9.0  6.3   Hemoglobin 13.0 - 17.0 g/dL 11.2  11.1  12.8   Hematocrit 39.0 - 52.0 % 33.4  32.4  38.0   Platelets 150 - 400 K/uL 166  180  223       Latest Ref Rng & Units 10/08/2022    4:37 AM 10/07/2022    7:40 PM 09/09/2022    1:23 PM  CMP  Glucose 70 - 99 mg/dL 93  104  111    105   BUN 8 - 23 mg/dL 25  26  33    33   Creatinine 0.61 - 1.24 mg/dL 1.43  1.73  1.85    1.81   Sodium 135 - 145 mmol/L 135  134  133    136   Potassium 3.5 - 5.1 mmol/L 3.7  4.1  5.7    5.8   Chloride 98 - 111 mmol/L 106  102  105    106   CO2 22 - 32 mmol/L '22  24  23    23   '$ Calcium 8.9 - 10.3 mg/dL 8.0  8.4  8.8    9.0   Total Protein 6.5 - 8.1 g/dL 5.8   6.7   Total Bilirubin 0.3 - 1.2 mg/dL 1.1   1.1   Alkaline Phos 38 - 126 U/L 91   70   AST 15 - 41 U/L 17   17   ALT 0 - 44 U/L 13   15        Micro Results No results found for this or any previous visit (from the past 240 hour(s)).  Radiology Reports CT Hip Right Wo Contrast  Result Date: 10/07/2022 CLINICAL DATA:  Fall, right hip fracture. EXAM: CT OF THE RIGHT HIP WITHOUT CONTRAST  TECHNIQUE: Multidetector CT imaging of the right hip was performed according to the standard protocol. Multiplanar CT image reconstructions were also generated. RADIATION DOSE REDUCTION: This exam was performed according to the departmental dose-optimization program which includes automated exposure control, adjustment of the mA and/or kV according to patient size and/or use of iterative reconstruction technique. COMPARISON:  Pelvic radiograph 10/07/2022 FINDINGS: Bones/Joint/Cartilage There is deformity of the right proximal femur with a right hip screw in place, single proximal cerclage and 4 interlocking screws along the lateral plate component. New/acute mildly comminuted fracture the greater trochanter encompassing the attachment sites of the gluteus medius and minimus. The fracture plane extends down to the upper margin of the plate but does not appear to extend further along the prostatic or traverse in a definite intertrochanteric manner. No other regional fracture observed. Ligaments Suboptimally assessed by CT. Muscles and Tendons Edema tracks along the atrophic right iliopsoas tendon and distal muscle. Low-level edema along the distal gluteus medius and minimus. Soft tissues Prominent median lobe of the prostate gland indents the bladder base. A direct right inguinal hernia contains adipose tissue and a loop of small bowel without complicating feature. Extensive atherosclerotic vascular disease noted. IMPRESSION: 1. Acute mildly comminuted fracture of the right greater trochanter encompassing the attachment sites of the gluteus medius and minimus. The fracture plane extends down to the upper margin of the plate but does not appear to extend further along the prostatic or extend in a definite intertrochanteric manner. 2. Edema tracks along the atrophic right iliopsoas tendon and distal muscle.  3. Direct right inguinal hernia contains adipose tissue and a loop of small bowel without complicating feature. 4.  Prominent median lobe of the prostate gland indents the bladder base. 5. Extensive atherosclerotic vascular disease. Electronically Signed   By: Van Clines M.D.   On: 10/07/2022 18:53   DG Elbow Complete Right  Result Date: 10/07/2022 CLINICAL DATA:  Right elbow pain after fall. EXAM: RIGHT ELBOW - COMPLETE 3+ VIEW COMPARISON:  None Available. FINDINGS: There is no evidence of fracture, dislocation, or joint effusion. There is no evidence of arthropathy or other focal bone abnormality. Soft tissues are unremarkable. IMPRESSION: Negative. Electronically Signed   By: Marijo Conception M.D.   On: 10/07/2022 15:55   DG Pelvis 1-2 Views  Result Date: 10/07/2022 CLINICAL DATA:  Fall. EXAM: PELVIS - 1-2 VIEW COMPARISON:  June 17, 2021. FINDINGS: Status post surgical internal fixation of old proximal right femoral fracture. No acute fracture or dislocation is noted. IMPRESSION: No acute abnormality seen. Electronically Signed   By: Marijo Conception M.D.   On: 10/07/2022 15:54    SIGNED: Deatra Haile, MD, FHM. [facility],  Pager (please use [email or web address] to page/text) Please use Epic Secure Chat for non-urgent communication (7AM-7PM)  If 7PM-7AM, please contact night-coverage [email or web address], 10/08/2022, 12:03 PM

## 2022-10-08 NOTE — Consult Note (Signed)
ORTHOPAEDIC CONSULTATION  REQUESTING PHYSICIAN: Deatra Keagan, MD  ASSESSMENT AND PLAN: 76 y.o. male with the following: Right, comminuted and minimally displaced greater trochanter fracture, superior to existing hardware  Orthopedics recommends admission to a medical service and we will provide consultation and follow along.  Patient has a fracture of the greater trochanter, that does not appear to extend through the intertrochanteric region.  Regardless, he has existing hardware, that can be used to treat intertrochanteric femur fractures.  In addition, he is not a great surgical candidate.  And consideration for surgery would require removal of the existing plate, with revision to another implant.  As such, I am recommending against surgery.  I think he can remain weightbearing as tolerated using a walker, with limited hip extension and abduction.  After he has had a chance to ambulate, we will plan to repeat x-rays.  Pending his course in the hospital, this can be completed in clinic.  - Weight Bearing Status/Activity: Protected weightbearing as tolerated, using a walker at all times.  Limited hip extension and hip abduction  - Additional recommended labs/tests: None  -VTE Prophylaxis: Can resume Eliquis and Plavix  - Pain control: As needed  - Follow-up plan: To be determined  -Procedures: None  Chief Complaint: Right hip pain  HPI: Ricardo Garrett is a 76 y.o. male with an extensive medical history, and history of right intertrochanteric femur fracture, treated by Dr. Luna Glasgow with a dynamic hip screw.  This was done in 2014.  Patient is incoherent, and is not answering questions appropriately.  He is also wearing soft restraints.  As such, all of his information is gleaned from the chart, and in discussion with the emergency room provider.  He sustained a fall yesterday.  He had right hip pain.  He had difficulty walking.  He was brought to the emergency department via EMS.   X-rays were negative for fracture, however CT scan demonstrated fracture proximal to the existing hardware.  Last dose of Plavix and Eliquis was yesterday morning.   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 12/2015: echo showing reduced EF of 35-40% b. NST: 09/2017: scar with peri-infarct ischemia, intermediate-risk study   Chronic kidney disease, stage 3 unspecified ([place]) 01/19/2016   COPD (chronic obstructive pulmonary disease) ([place])    "was on RX when he had CHF; not on anything now" (11/23/2017)   Coronary artery disease involving native coronary artery of native heart with unstable angina pectoris (HCC)    High cholesterol    HOH (hard of hearing)    Hypertension    Hypoalbuminemia 01/19/2016   Mixed anxiety and depressive disorder 07/06/2022   Monoclonal gammopathy 01/14/2020   Persistent atrial fibrillation ([place])    a. s/p DCCV in 03/2016   Tachycardia-bradycardia syndrome [facility])    Past Surgical History:  Procedure Laterality Date   CARDIAC CATHETERIZATION  11/23/2017   CARDIOVERSION N/A 04/05/2016   Procedure: CARDIOVERSION;  Surgeon: Josue Hector, MD;  Location: [facility];  Service: Cardiovascular;  Laterality: N/A;   COMPRESSION HIP SCREW Right 03/15/2013   Procedure: COMPRESSION HIP;  Surgeon:  Sanjuana Kava, MD;  Location: [facility] ORS;  Service: Orthopedics;  Laterality: Right;   CORONARY ATHERECTOMY N/A 11/24/2017   Procedure: CORONARY ATHERECTOMY;  Surgeon: Burnell Blanks, MD;  Location: [place] CV LAB;  Service: Cardiovascular;  Laterality: N/A;   CORONARY BALLOON ANGIOPLASTY N/A 11/24/2017   Procedure: CORONARY BALLOON ANGIOPLASTY;  Surgeon:  Burnell Blanks, MD;  Location: [place] CV LAB;  Service: Cardiovascular;  Laterality: N/A;   CORONARY STENT INTERVENTION N/A 11/24/2017   Procedure: CORONARY STENT INTERVENTION;  Surgeon: Burnell Blanks, MD;  Location: [place] CV LAB;  Service: Cardiovascular;  Laterality: N/A;   FRACTURE SURGERY     PACEMAKER IMPLANT N/A 11/27/2017   Procedure: PACEMAKER IMPLANT;  Surgeon: Evans Lance, MD;  Location: [place] CV LAB;  Service: Cardiovascular;  Laterality: N/A;   RIGHT/LEFT HEART CATH AND CORONARY ANGIOGRAPHY N/A 11/23/2017   Procedure: RIGHT/LEFT HEART CATH AND CORONARY ANGIOGRAPHY;  Surgeon: Burnell Blanks, MD;  Location: [place] CV LAB;  Service: Cardiovascular;  Laterality: N/A;   SKIN GRAFT Left    from right thigh   TONSILLECTOMY     Social History   Socioeconomic History   Marital status: Married    Spouse name: Not on file   Number of children: Not on file   Years of education: Not on file   Highest education level: Not on file  Occupational History   Not on file  Tobacco Use   Smoking status: Former    Packs/day: 1.00    Years: 20.00    Total pack years: 20.00    Types: Cigarettes    Quit date: 11/01/1979    Years since quitting: 42.9   Smokeless tobacco: Never  Vaping Use   Vaping Use: Never used  Substance and Sexual Activity   Alcohol use: Yes    Comment: 11/23/2017 "beer once/month maybe"   Drug use: Not Currently    Types: Marijuana    Comment: 11/23/2017 "nothing for years"   Sexual activity: Yes    Birth control/protection: None  Other Topics Concern   Not on file  Social History Narrative   Not on file   Social Determinants of Health   Financial Resource Strain: Low Risk  (01/14/2020)   Overall Financial Resource Strain (CARDIA)    Difficulty of Paying Living Expenses: Not very hard  Food Insecurity: No Food Insecurity (01/14/2020)   Hunger Vital Sign    Worried About Running Out of Food in the Last Year: Never  true    Ran Out of Food in the Last Year: Never true  Transportation Needs: No Transportation Needs (01/14/2020)   PRAPARE - Hydrologist (Medical): No    Lack of Transportation (Non-Medical): No  Physical Activity: Inactive (01/14/2020)   Exercise Vital Sign    Days of Exercise per Week: 0 days    Minutes of Exercise per Session: 0 min  Stress: No Stress Concern Present (01/14/2020)   [place]    Feeling of Stress : Not at all  Social Connections: Moderately Isolated (01/14/2020)   Social Connection and Isolation Panel [NHANES]    Frequency of Communication with Friends and Family: Twice a week    Frequency of Social Gatherings with Friends and Family: Twice a week    Attends Religious Services: Never    Marine scientist or Organizations: No    Attends Archivist Meetings: Never    Marital  Status: Married   Family History  Problem Relation Age of Onset   Diabetes Mother    Heart attack Father    No Known Allergies Prior to Admission medications   Medication Sig Start Date End Date Taking? Authorizing Provider  amLODipine (NORVASC) 2.5 MG tablet Take 2.5 mg by mouth daily.   Yes [provider]  apixaban (ELIQUIS) 2.5 MG TABS tablet Take 1 tablet (2.5 mg total) by mouth 2 (two) times daily. 05/20/19  Yes Josue Hector, MD  atorvastatin (LIPITOR) 80 MG tablet Take 1 tablet (80 mg total) by mouth daily. 05/02/19  Yes Josue Hector, MD  clopidogrel (PLAVIX) 75 MG tablet Take 1 tablet by mouth once daily 03/31/20  Yes Josue Hector, MD  diclofenac Sodium (VOLTAREN) 1 % GEL Apply 2 g topically 4 (four) times daily.   Yes [provider]  digoxin (LANOXIN) 0.125 MG tablet Take 1 tablet (125 mcg total) by mouth daily. 05/02/19  Yes Josue Hector, MD  escitalopram (LEXAPRO) 5 MG tablet Take 5 mg by mouth daily.   Yes [provider]  furosemide (LASIX)  40 MG tablet Take 1 tablet (40 mg total) by mouth daily. 05/02/19  Yes Josue Hector, MD  HYDROcodone-acetaminophen (NORCO) 7.5-325 MG tablet Take 1 tablet by mouth 2 (two) times daily.   Yes [provider]  LOKELMA 10 g PACK packet Take 1 packet by mouth daily. 06/03/21  Yes [provider]  losartan (COZAAR) 50 MG tablet Take 1 tablet by mouth daily. 11/13/17  Yes [provider]  metoprolol succinate (TOPROL-XL) 25 MG 24 hr tablet TAKE 1/2 (ONE-HALF) TABLET BY MOUTH IN THE MORNING AND 1 TABLET IN THE EVENING 05/02/19  Yes Josue Hector, MD  tamsulosin (FLOMAX) 0.4 MG CAPS capsule Take 0.4 mg by mouth daily. 10/08/20  Yes [provider]  amLODipine (NORVASC) 5 MG tablet Take 5 mg by mouth daily. Patient not taking: Reported on 10/07/2022 05/11/22   [provider]   CT Hip Right Wo Contrast  Result Date: 10/07/2022 CLINICAL DATA:  Fall, right hip fracture. EXAM: CT OF THE RIGHT HIP WITHOUT CONTRAST TECHNIQUE: Multidetector CT imaging of the right hip was performed according to the standard protocol. Multiplanar CT image reconstructions were also generated. RADIATION DOSE REDUCTION: This exam was performed according to the departmental dose-optimization program which includes automated exposure control, adjustment of the mA and/or kV according to patient size and/or use of iterative reconstruction technique. COMPARISON:  Pelvic radiograph 10/07/2022 FINDINGS: Bones/Joint/Cartilage There is deformity of the right proximal femur with a right hip screw in place, single proximal cerclage and 4 interlocking screws along the lateral plate component. New/acute mildly comminuted fracture the greater trochanter encompassing the attachment sites of the gluteus medius and minimus. The fracture plane extends down to the upper margin of the plate but does not appear to extend further along the prostatic or traverse in a definite intertrochanteric manner. No other regional  fracture observed. Ligaments Suboptimally assessed by CT. Muscles and Tendons Edema tracks along the atrophic right iliopsoas tendon and distal muscle. Low-level edema along the distal gluteus medius and minimus. Soft tissues Prominent median lobe of the prostate gland indents the bladder base. A direct right inguinal hernia contains adipose tissue and a loop of small bowel without complicating feature. Extensive atherosclerotic vascular disease noted. IMPRESSION: 1. Acute mildly comminuted fracture of the right greater trochanter encompassing the attachment sites of the gluteus medius and minimus. The fracture plane extends  down to the upper margin of the plate but does not appear to extend further along the prostatic or extend in a definite intertrochanteric manner. 2. Edema tracks along the atrophic right iliopsoas tendon and distal muscle. 3. Direct right inguinal hernia contains adipose tissue and a loop of small bowel without complicating feature. 4. Prominent median lobe of the prostate gland indents the bladder base. 5. Extensive atherosclerotic vascular disease. Electronically Signed   By: Van Clines M.D.   On: 10/07/2022 18:53   DG Elbow Complete Right  Result Date: 10/07/2022 CLINICAL DATA:  Right elbow pain after fall. EXAM: RIGHT ELBOW - COMPLETE 3+ VIEW COMPARISON:  None Available. FINDINGS: There is no evidence of fracture, dislocation, or joint effusion. There is no evidence of arthropathy or other focal bone abnormality. Soft tissues are unremarkable. IMPRESSION: Negative. Electronically Signed   By: Marijo Conception M.D.   On: 10/07/2022 15:55   DG Pelvis 1-2 Views  Result Date: 10/07/2022 CLINICAL DATA:  Fall. EXAM: PELVIS - 1-2 VIEW COMPARISON:  June 17, 2021. FINDINGS: Status post surgical internal fixation of old proximal right femoral fracture. No acute fracture or dislocation is noted. IMPRESSION: No acute abnormality seen. Electronically Signed   By: Marijo Conception M.D.    On: 10/07/2022 15:54    Family History Reviewed and non-contributory, no pertinent history of problems with bleeding or anesthesia    Review of Systems Unable to assess due to patient factors    OBJECTIVE  Vitals:Patient Vitals for the past 8 hrs:  BP Temp Temp src Pulse Resp SpO2  10/08/22 0845 -- -- -- 67 19 100 %  10/08/22 0830 (!) 147/94 -- -- 91 17 94 %  10/08/22 0800 (!) 144/78 -- -- 98 18 --  10/08/22 0730 (!) 154/83 -- -- 73 19 99 %  10/08/22 0700 (!) 169/81 -- -- 64 15 97 %  10/08/22 0634 -- 97.9 F (36.6 C) Oral -- -- --  10/08/22 0630 (!) 153/83 -- -- 70 (!) 21 96 %  10/08/22 0600 (!) 147/80 -- -- 76 (!) 21 95 %  10/08/22 0530 (!) 151/92 -- -- 64 18 94 %  10/08/22 0432 -- -- -- 84 17 99 %  10/08/22 0430 (!) 157/90 -- -- 82 17 99 %  10/08/22 0330 (!) 169/93 -- -- 86 (!) 22 97 %  10/08/22 0230 (!) 165/87 -- -- 63 18 96 %  10/08/22 0201 -- -- -- 83 18 95 %  10/08/22 0134 -- -- -- 72 19 97 %  10/08/22 0130 (!) 158/74 -- -- 77 19 90 %   General: Laying in bed wearing soft restraints.  Appears comfortable Cardiovascular: Warm extremities noted Respiratory: No cyanosis, no use of accessory musculature GI: No organomegaly, abdomen is soft and non-tender Skin: No lesions in the area of chief complaint other than those listed below in MSK exam.  Well-healed lateral hip surgical incision Neurologic: Responds to light touch of the right foot Psychiatric: He is arousable, but does not answer questions appropriately.  Does not ask appropriate questions Lymphatic: No swelling obvious and reported other than the area involved in the exam below Extremities   RLE: Lateral base surgical incision is healed.  No surrounding erythema or drainage.  No bruising is appreciated.  His ankle dorsiflexes when you touch the plantar foot.  We response to light touch over the dorsum of the foot.  Toes warm and well-perfused.  Complains of pain when he moves his hip in response  to touch of the  right foot.     Test Results Imaging X-rays of the right hip demonstrates a well-healed intertrochanteric femur fracture, with presence of a lateral based plate and cephalomedullary screw.  No obvious fracture based on the x-rays.  CT scan demonstrates comminuted, minimally displaced fracture of the greater trochanter, extending to the superior aspect of the existing hardware.  No obvious extension through the intertrochanteric region.  No hardware failure.  CT scan right hip Impression Acute mildly comminuted fracture of the right greater trochanter encompassing the attachment sites of the gluteus medius and minimus. The fracture plane extends down to the upper margin of the plate but does not appear to extend further along the prostatic or extend in a definite intertrochanteric manner.  Labs cbc Recent Labs    10/07/22 1940 10/08/22 0437  WBC 9.0 5.6  HGB 11.1* 11.2*  HCT 32.4* 33.4*  PLT 180 166      Recent Labs    10/07/22 1940 10/08/22 0437  NA 134* 135  K 4.1 3.7  CL 102 106  CO2 24 22  GLUCOSE 104* 93  BUN 26* 25*  CREATININE 1.73* 1.43*  CALCIUM 8.4* 8.0*

## 2022-10-08 NOTE — ED Notes (Signed)
Provider notified of not being able to give pt PO medications of flomax and metoprolol-XL. Provider ok with it for now

## 2022-10-08 NOTE — Hospital Course (Signed)
Ricardo Garrett is a 76 y.o. male with medical history significant of arthritis, coronary artery disease, CHF, atrial fibrillation, hypertension, alcohol use, presents to the ED with a chief complaint of mechanical fall.  Patient is unfortunately not able to provide any history to me.  Per chart review he reported that he was walking his dog when he had a mechanical fall.  He reported to ED that he fell because he was trying to move too quickly after the dog made him angry.  He landed on the side of his body.  His home health nurse arrived an hour later and called his daughter and they decided to bring him into the ED.  He was complaining of pain in his right hip and he was found to have a hip fracture.  Patient is on Eliquis and Plavix and Ortho requested that we hold those.  Ed : Temp 98, heart rate 58-61, RR 16-18, BP 106/69-147/79, satting at 100% No leukocytosis with WBC 9.0, hemoglobin 11.1, platelets 180 Chemistry is unremarkable aside from an elevated creatinine which is near to baseline with a previous creatinine being 1.81 and before that 1.71 Alcohol level 77 CT right hip shows acute mildly comminuted fracture of right greater trochanter encompassing the attachment sites of the gluteus medius and minimus.  Fracture plane extends down to upper margin of plate but does not appear to extend further along the prosthetic Patient was started on CIWA protocol given thiamine, 1 L bolus, Percocet Unfortunately patient was given Ativan via CIWA and was not able to provide any history

## 2022-10-09 ENCOUNTER — Inpatient Hospital Stay (HOSPITAL_COMMUNITY): Payer: Medicare HMO

## 2022-10-09 DIAGNOSIS — S72001G Fracture of unspecified part of neck of right femur, subsequent encounter for closed fracture with delayed healing: Secondary | ICD-10-CM | POA: Diagnosis not present

## 2022-10-09 DIAGNOSIS — R4182 Altered mental status, unspecified: Secondary | ICD-10-CM

## 2022-10-09 LAB — BASIC METABOLIC PANEL WITH GFR
Anion gap: 5 (ref 5–15)
BUN: 26 mg/dL — ABNORMAL HIGH (ref 8–23)
CO2: 22 mmol/L (ref 22–32)
Calcium: 7.7 mg/dL — ABNORMAL LOW (ref 8.9–10.3)
Chloride: 109 mmol/L (ref 98–111)
Creatinine, Ser: 1.65 mg/dL — ABNORMAL HIGH (ref 0.61–1.24)
GFR, Estimated: 42 mL/min — ABNORMAL LOW (ref >60)
Glucose, Bld: 104 mg/dL — ABNORMAL HIGH (ref 70–99)
Potassium: 3.9 mmol/L (ref 3.5–5.1)
Sodium: 136 mmol/L (ref 135–145)

## 2022-10-09 MED ORDER — METOPROLOL SUCCINATE ER 25 MG PO TB24
12.5000 mg | ORAL_TABLET | Freq: Two times a day (BID) | ORAL | Status: DC
  Administered 2022-10-09 – 2022-10-16 (×9): 12.5 mg via ORAL
  Filled 2022-10-09 (×13): qty 1

## 2022-10-09 MED ORDER — QUETIAPINE FUMARATE 25 MG PO TABS: 25.0000 mg | ORAL_TABLET | Freq: Every day | ORAL | Status: DC

## 2022-10-09 MED ORDER — OXYCODONE HCL 5 MG PO TABS
5.0000 mg | ORAL_TABLET | Freq: Three times a day (TID) | ORAL | Status: DC
  Administered 2022-10-09 – 2022-10-13 (×6): 5 mg via ORAL
  Filled 2022-10-09 (×6): qty 1

## 2022-10-09 MED ORDER — AMLODIPINE BESYLATE 5 MG PO TABS
10.0000 mg | ORAL_TABLET | Freq: Every day | ORAL | Status: DC
  Administered 2022-10-09 – 2022-10-17 (×5): 10 mg via ORAL
  Filled 2022-10-09 (×9): qty 2

## 2022-10-09 MED ORDER — SODIUM CHLORIDE 0.9 % IV SOLN: INTRAVENOUS | Status: AC

## 2022-10-09 MED ORDER — DOCUSATE SODIUM 100 MG PO CAPS
200.0000 mg | ORAL_CAPSULE | Freq: Two times a day (BID) | ORAL | Status: DC
  Administered 2022-10-09: 200 mg via ORAL
  Filled 2022-10-09 (×2): qty 2

## 2022-10-09 MED ORDER — QUETIAPINE FUMARATE 25 MG PO TABS
25.0000 mg | ORAL_TABLET | Freq: Two times a day (BID) | ORAL | Status: DC
  Administered 2022-10-09: 25 mg via ORAL
  Filled 2022-10-09 (×2): qty 1

## 2022-10-09 MED ORDER — METOPROLOL SUCCINATE ER 25 MG PO TB24: 25.0000 mg | ORAL_TABLET | Freq: Two times a day (BID) | ORAL | Status: DC

## 2022-10-09 MED ORDER — LOSARTAN POTASSIUM 50 MG PO TABS
25.0000 mg | ORAL_TABLET | Freq: Every day | ORAL | Status: DC
  Administered 2022-10-14 – 2022-10-17 (×4): 25 mg via ORAL
  Filled 2022-10-09 (×7): qty 1

## 2022-10-09 NOTE — Progress Notes (Signed)
   10/09/22 1346  Assess: MEWS Score  Temp 97.7 F (36.5 C)  BP 135/80  MAP (mmHg) 94  Pulse Rate 86  Resp 20  Level of Consciousness Responds to Pain  SpO2 98 %  O2 Device Room Air  Assess: MEWS Score  MEWS Temp 0  MEWS Systolic 0  MEWS Pulse 0  MEWS RR 0  MEWS LOC 2  MEWS Score 2  MEWS Score Color Yellow  Assess: if the MEWS score is Yellow or Red  Were vital signs taken at a resting state? Yes  Focused Assessment No change from prior assessment  Does the patient meet 2 or more of the SIRS criteria? No  Treat  MEWS Interventions Administered scheduled meds/treatments  Pain Scale Faces  Pain Score 4  Faces Pain Scale 4  Pain Type Acute pain  Pain Location Hip  Pain Orientation Right  Notify: Charge Nurse/RN  Name of Charge Nurse/RN Notified Audrea Muscat  Date Charge Nurse/RN Notified 10/09/22  Time Charge Nurse/RN Notified 1400  Document  Patient Outcome Other (Comment)  Progress note created (see row info) Yes  Assess: SIRS CRITERIA  SIRS Temperature  0  SIRS Pulse 0  SIRS Respirations  0  SIRS WBC 1  SIRS Score Sum  1

## 2022-10-09 NOTE — Progress Notes (Signed)
Pt has been nonverbal this shift.  Opened eyes once to look at this nurse.  Allowed oral cares.  Morphine x 2 for nonverbal s/s of pain.  Repositioned as pt would allow.  Placed on 2L O2.  Pt would not stay awake for hs eliquis and metoprolol.  Reviewed with provider, ok to hold for now.  AFIB on monitor.

## 2022-10-09 NOTE — Progress Notes (Signed)
PROGRESS NOTE    Patient: Ricardo Garrett                            PCP: [name], MD                    DOB: 08/11/1946            DOA: 10/07/2022 [id number]:[id number]             DOS: 10/09/2022, 10:59 AM   LOS: 2 days   Date of Service: The patient was seen and examined on 10/09/2022  Subjective:   The patient was seen and examined this morning, remained confused, agitated, uncomfortable in bed.  Daughter present bedside, reports suspecting underlying dementia, consistently confused at home   Discussed that the surgery is not an option at this point...    Brief Narrative:   Ricardo Garrett is a 76 y.o. male with medical history significant of arthritis, coronary artery disease, CHF, atrial fibrillation, hypertension, alcohol use, presents to the ED with a chief complaint of mechanical fall.  Patient is unfortunately not able to provide any history to me.  Per chart review he reported that he was walking his dog when he had a mechanical fall.  He reported to ED that he fell because he was trying to move too quickly after the dog made him angry.  He landed on the side of his body.  His home health nurse arrived an hour later and called his daughter and they decided to bring him into the ED.  He was complaining of pain in his right hip and he was found to have a hip fracture.  Patient is on Eliquis and Plavix and Ortho requested that we hold those.  Ed : Temp 98, heart rate 58-61, RR 16-18, BP 106/69-147/79, satting at 100% No leukocytosis with WBC 9.0, hemoglobin 11.1, platelets 180 Chemistry is unremarkable aside from an elevated creatinine which is near to baseline with a previous creatinine being 1.81 and before that 1.71 Alcohol level 77 CT right hip shows acute mildly comminuted fracture of right greater trochanter encompassing the attachment sites of the gluteus medius and minimus.  Fracture plane extends down to upper margin of plate but does not appear to extend further along  the prosthetic Patient was started on CIWA protocol given thiamine, 1 L bolus, Percocet Unfortunately patient was given Ativan via CIWA and was not able to provide any history    Assessment & Plan:   Principal Problem:   Hip fracture ([place]) Active Problems:   Altered mental status   Alcohol use   Hypomagnesemia   Paroxysmal atrial fibrillation (H[facility])   Essential hypertension   HLD (hyperlipidemia)   Fall at home, initial encounter     Assessment and Plan: * Hip fracture ([place]) -CT right hip shows acute mildly comminuted fracture of the greater trochanter in ipsilateral side as previous hip surgery  -Ortho consulted,  - on chronic meds-  anticoagulation-will resume Eliquis, will hold his Plavix In case of urgent deterioration, surgical intervention.  -Per Dr. Sylvester Harder- orthopedic, patient is not at surgical candidate at this point Recommending weightbearing as tolerated, as tolerated using walker with limited hip extension and abduction -Anticipating reevaluation and and x-rays  Altered mental status -Multifactorial, underlying dementia -Confusion agitation; due to sundowning, pain, analgesics, possible alcohol withdrawal -Monitor very closely, will add small dose of Seroquel nightly,  optimizing pain management -As needed Haldol  Fall at home, initial encounter -Described as a mechanical fall to ED staff -Resulting hip fracture -Continue to monitor  -PT/OT >>> the patient is unable to ambulate or carry his ADLs, would likely need SNF  HLD (hyperlipidemia) -Continue statin  Essential hypertension -Continue norvasc, losartan and metoprolol -As needed hydralazine  Paroxysmal atrial fibrillation (HCC) -Continue digoxin and metoprolol -Will resume his Eliquis, will hold Plavix -continue to monitor  Hypomagnesemia -Mag 1.5 -Was repleted -Monitor magnesium level  Alcohol use -Per daughter she is not very concerned about his alcohol consumption--mainly confusion and  dementia -EtOH 77 -CIWA -Continue to monitor -Pleasantly confused now, no signs of withdrawal yet       ------------------------------------------------------------------------------------------------------------------------------------------------  DVT prophylaxis:  apixaban (ELIQUIS) tablet 2.5 mg Start: 10/08/22 1300 SCDs Start: 10/07/22 2054 apixaban (ELIQUIS) tablet 2.5 mg   Code Status:   Code Status: Full Code  Family Communication: No family member present at bedside- attempt will be made to update daily The above findings and plan of care has been discussed with patient (and family)  in detail,  they expressed understanding and agreement of above. -Advance care planning has been discussed.   Admission status:   Status is: Inpatient Remains inpatient appropriate because: Needing close evaluation, IV fluids, orthopedic evaluation, pain management     Procedures:   No admission procedures for hospital encounter.   Antimicrobials:  Anti-infectives (From admission, onward)    None        Medication:   amLODipine  10 mg Oral Daily   apixaban  2.5 mg Oral BID   atorvastatin  80 mg Oral Daily   citalopram  20 mg Oral Daily   digoxin  125 mcg Oral Daily   LORazepam  0-4 mg Intravenous Q6H   Or   LORazepam  0-4 mg Oral Q6H   [START ON 10/10/2022] LORazepam  0-4 mg Intravenous Q12H   [START ON 10/10/2022] losartan  25 mg Oral Daily   metoprolol succinate  12.5 mg Oral BID   tamsulosin  0.4 mg Oral Daily   thiamine  100 mg Oral Daily   Or   thiamine  100 mg Intravenous Daily    acetaminophen **OR** acetaminophen, morphine injection, ondansetron **OR** ondansetron (ZOFRAN) IV, oxyCODONE   Objective:   Vitals:   10/08/22 1658 10/08/22 1700 10/08/22 2208 10/09/22 0353  BP: (!) 151/85 137/78 (!) 149/60 117/68  Pulse: 76 90 80 61  Resp:   18 20  Temp:  99.2 F (37.3 C) 99.7 F (37.6 C) 98.6 F (37 C)  TempSrc:  Oral Oral Oral  SpO2:   (!) 89% 100%   Weight:      Height:        Intake/Output Summary (Last 24 hours) at 10/09/2022 1059 Last data filed at 10/09/2022 0308 Gross per 24 hour  Intake 873.1 ml  Output --  Net 873.1 ml   Filed Weights   10/07/22 1431 10/08/22 1030  Weight: 52.2 kg 56.7 kg     Examination:     General:  AAO x 1, confused, mildly agitated, seems to be moaning in pain and upper extremity area   HEENT:  Normocephalic, PERRL, otherwise with in Normal limits   Neuro:  CNII-XII intact. , normal motor and sensation, reflexes intact   Lungs:   Clear to auscultation BL, Respirations unlabored,  No wheezes / crackles  Cardio:    S1/S2, RRR, No murmure, No Rubs or Gallops   Abdomen:  Soft, non-tender, bowel sounds active all four quadrants,  no guarding or peritoneal signs.  Muscular  skeletal:  Right hip discomfort, limited range of motion due to pain Limited exam -global generalized weaknesses - in bed, able to move all 4 extremities,   2+ pulses,  symmetric, No pitting edema  Skin:  Dry, warm to touch, negative for any Rashes,  Wounds: Please see nursing documentation         ------------------------------------------------------------------------------------------------------------------------------------------    LABs:     Latest Ref Rng & Units 10/08/2022    4:37 AM 10/07/2022    7:40 PM 09/09/2022    1:23 PM  CBC  WBC 4.0 - 10.5 K/uL 5.6  9.0  6.3   Hemoglobin 13.0 - 17.0 g/dL 11.2  11.1  12.8   Hematocrit 39.0 - 52.0 % 33.4  32.4  38.0   Platelets 150 - 400 K/uL 166  180  223       Latest Ref Rng & Units 10/09/2022    4:54 AM 10/08/2022    4:37 AM 10/07/2022    7:40 PM  CMP  Glucose 70 - 99 mg/dL 104  93  104   BUN 8 - 23 mg/dL '26  25  26   '$ Creatinine 0.61 - 1.24 mg/dL 1.65  1.43  1.73   Sodium 135 - 145 mmol/L 136  135  134   Potassium 3.5 - 5.1 mmol/L 3.9  3.7  4.1   Chloride 98 - 111 mmol/L 109  106  102   CO2 22 - 32 mmol/L '22  22  24   '$ Calcium 8.9 - 10.3 mg/dL 7.7  8.0   8.4   Total Protein 6.5 - 8.1 g/dL  5.8    Total Bilirubin 0.3 - 1.2 mg/dL  1.1    Alkaline Phos 38 - 126 U/L  91    AST 15 - 41 U/L  17    ALT 0 - 44 U/L  13         Micro Results No results found for this or any previous visit (from the past 240 hour(s)).  Radiology Reports No results found.  SIGNED: Deatra Ferd, MD, FHM. [facility],  Pager (please use [email or web address] to page/text) Please use Epic Secure Chat for non-urgent communication (7AM-7PM)  If 7PM-7AM, please contact night-coverage [email or web address], 10/09/2022, 10:59 AM

## 2022-10-09 NOTE — ED Provider Notes (Signed)
Has been lethargic all day but at one point when family was here was able to take medication crushed and in applesauce since he had multiple medications and could take them in one or two bites.  When having to be pulled up in bed or repositioned does yell in pain with right arm and leg.  Multiple xrays completed on right arm, elbow and shoulder and no fractures noted.  Has received no pain medication except the scheduled oxy today due to lethargy. Seems to rest well when not receiving care.  Daughter at bedside at this time and says that up until this last fall that he is independent at home

## 2022-10-09 NOTE — Progress Notes (Signed)
   10/09/22 1835  Assess: MEWS Score  Temp 100 F (37.8 C)  BP (!) 146/89  MAP (mmHg) 102  Pulse Rate 91  Resp 16  Level of Consciousness Responds to Pain  SpO2 95 %  O2 Device Room Air  Assess: MEWS Score  MEWS Temp 0  MEWS Systolic 0  MEWS Pulse 0  MEWS RR 0  MEWS LOC 2  MEWS Score 2  MEWS Score Color Yellow  Assess: if the MEWS score is Yellow or Red  Were vital signs taken at a resting state? Yes  Does the patient meet 2 or more of the SIRS criteria? No  Treat  Pain Scale Faces  Pain Score 4  Faces Pain Scale 4  Pain Type Acute pain  Pain Location Hip  Pain Orientation Right  Document  Patient Outcome Other (Comment)  Progress note created (see row info) Yes  Assess: SIRS CRITERIA  SIRS Temperature  0  SIRS Pulse 1  SIRS Respirations  0  SIRS WBC 1  SIRS Score Sum  2

## 2022-10-09 NOTE — Assessment & Plan Note (Signed)
Mentation waxes and wanes, worsening at night with agitation, confusion -Multifactorial, underlying dementia -Confusion agitation; due to sundowning, pain, analgesics, possible alcohol withdrawal -added Seroquel nightly,  optimizing pain management (medication has been modified and reduced per family request) -As needed Haldol

## 2022-10-10 ENCOUNTER — Inpatient Hospital Stay (HOSPITAL_COMMUNITY): Payer: Medicare HMO

## 2022-10-10 DIAGNOSIS — R400 Somnolence: Secondary | ICD-10-CM | POA: Diagnosis not present

## 2022-10-10 DIAGNOSIS — S72009A Fracture of unspecified part of neck of unspecified femur, initial encounter for closed fracture: Secondary | ICD-10-CM

## 2022-10-10 DIAGNOSIS — R4182 Altered mental status, unspecified: Secondary | ICD-10-CM

## 2022-10-10 DIAGNOSIS — Z515 Encounter for palliative care: Secondary | ICD-10-CM

## 2022-10-10 DIAGNOSIS — Z66 Do not resuscitate: Secondary | ICD-10-CM

## 2022-10-10 DIAGNOSIS — G9341 Metabolic encephalopathy: Secondary | ICD-10-CM | POA: Diagnosis not present

## 2022-10-10 DIAGNOSIS — Z7189 Other specified counseling: Secondary | ICD-10-CM

## 2022-10-10 DIAGNOSIS — S72101A Unspecified trochanteric fracture of right femur, initial encounter for closed fracture: Secondary | ICD-10-CM

## 2022-10-10 LAB — BASIC METABOLIC PANEL
Anion gap: 3 — ABNORMAL LOW (ref 5–15)
BUN: 28 mg/dL — ABNORMAL HIGH (ref 8–23)
CO2: 22 mmol/L (ref 22–32)
Calcium: 8 mg/dL — ABNORMAL LOW (ref 8.9–10.3)
Chloride: 113 mmol/L — ABNORMAL HIGH (ref 98–111)
Creatinine, Ser: 1.61 mg/dL — ABNORMAL HIGH (ref 0.61–1.24)
GFR, Estimated: 44 mL/min — ABNORMAL LOW (ref >60)
Glucose, Bld: 113 mg/dL — ABNORMAL HIGH (ref 70–99)
Potassium: 4.2 mmol/L (ref 3.5–5.1)
Sodium: 138 mmol/L (ref 135–145)

## 2022-10-10 MED ORDER — LORAZEPAM 2 MG/ML IJ SOLN
0.5000 mg | Freq: Three times a day (TID) | INTRAMUSCULAR | Status: DC | PRN
  Administered 2022-10-11: 0.5 mg via INTRAVENOUS
  Filled 2022-10-10: qty 1

## 2022-10-10 MED ORDER — FENTANYL CITRATE PF 50 MCG/ML IJ SOSY
25.0000 ug | PREFILLED_SYRINGE | INTRAMUSCULAR | Status: DC | PRN
  Administered 2022-10-10 – 2022-10-12 (×8): 25 ug via INTRAVENOUS
  Filled 2022-10-10 (×9): qty 1

## 2022-10-10 MED ORDER — METHOCARBAMOL 500 MG PO TABS
500.0000 mg | ORAL_TABLET | Freq: Three times a day (TID) | ORAL | Status: DC
  Administered 2022-10-10: 500 mg via ORAL
  Filled 2022-10-10 (×2): qty 1

## 2022-10-10 MED ORDER — SENNOSIDES-DOCUSATE SODIUM 8.6-50 MG PO TABS
2.0000 | ORAL_TABLET | Freq: Every day | ORAL | Status: DC
  Administered 2022-10-13 – 2022-10-16 (×4): 2 via ORAL
  Filled 2022-10-10 (×4): qty 2

## 2022-10-10 MED ORDER — FOLIC ACID 1 MG PO TABS
1.0000 mg | ORAL_TABLET | Freq: Every day | ORAL | Status: DC
  Administered 2022-10-14 – 2022-10-17 (×4): 1 mg via ORAL
  Filled 2022-10-10 (×7): qty 1

## 2022-10-10 MED ORDER — QUETIAPINE FUMARATE 25 MG PO TABS: 25.0000 mg | ORAL_TABLET | Freq: Every day | ORAL | Status: DC

## 2022-10-10 MED ORDER — DEXTROSE-NACL 5-0.45 % IV SOLN: INTRAVENOUS | Status: AC

## 2022-10-10 MED ORDER — LABETALOL HCL 5 MG/ML IV SOLN: 10.0000 mg | INTRAVENOUS | Status: DC | PRN

## 2022-10-10 NOTE — Progress Notes (Signed)
PROGRESS NOTE     Ricardo Garrett, is a 76 y.o. male, DOB - December 22, 1945, YHC:[id number]  Admit date - 10/07/2022   Admitting Physician Rolla Plate, DO  Outpatient Primary MD for the patient is [name], MD  LOS - 3  Chief Complaint  Patient presents with   Fall        Brief Narrative:   76 y.o. male with medical history significant of arthritis, coronary artery disease, CHF, atrial fibrillation, hypertension, alcohol use admitted on 10/07/2022 with right hip fracture after mechanical fall at home  A/p 1)Right Hip Fracture CT right hip shows acute mildly comminuted fracture of the greater trochanter in ipsilateral side as previous hip surgery  -Orthopedic surgeon Dr Amedeo Kinsman recommends nonoperative management -P.o. oxycodone alternating with IV fentanyl as needed for pain control -Methocarbamol as ordered -Get PT OT after adequate pain control -Patient will most likely need SNF level rehab -Orthopedic surgeon recommends:-weightbearing as tolerated using a walker, with limited hip extension and abduction.  After he has had a chance to ambulate, we will plan to repeat x-rays.  this can be completed in clinic as outpatient  - Weight Bearing Status/Activity: Protected weightbearing as tolerated, using a walker at all times.  Limited hip extension and hip abduction  2) acute metabolic encephalopathy----multifactorial -[name] [name] and patient's daughter [name] reports history of cognitive deficits/decline at baseline... Patient with concerns about alcohol use... Currently receiving benzos and opiates -Will get CT head to rule out intracranial abnormalities -Will be more judicious with opiates and benzos at this time  3)PAFib--continue digoxin and metoprolol for rate control -Continue Eliquis for stroke prophylaxis  4) alcohol use concerns- Blood alcohol level was 77 on admission -Please see #2 above  5) hypomagnesemia  6)HTN--amlodipine 10 mg daily, losartan  25 mg daily, and metoprolol XL 12.5 mg twice daily -IV labetalol as needed elevated BP  7) status post fall with musculoskeletal injury/pain--concerns about shoulder elbow and wrist pain-- -right wrist right shoulder and right elbow x-rays without acute findings -Get OT eval -Will benefit from right wrist splint--suspect sprain/ligament/tendon and soft tissue injury -Pain management as above #1  8)Recurrent falls---PTA pt lived at home and did very poorly, patient has significant limitations with mobility related ADLs- this patient needs to continue to be monitored in the hospital until a SNF bed is obtained as she is not safe to go home with her current physcical limitations  9) depression/mood disorder--- change Seroquel to 25 mg nightly due to oversedation concerns, continue Celexa -Be judicious with benzos  10)FEN--poor oral intake due to altered mentation as above #2 -Gentle IV fluids until oral intake improves  11) social/ethics--- plan of care discussed with patient's daughter [name],  patient's wife [name] at bedside they had multiple questions -Patient is a full code  12)CKD stage -3B -Creatinine currently at 1.61 which is close to prior baseline -Gentle hydration with IV fluids as above #10 - renally adjust medications, avoid nephrotoxic agents / dehydration  / hypotension   Status is: Inpatient   Disposition: The patient is from: Home              Anticipated d/c is to: SNF              Anticipated d/c date is: 2 days              Patient currently is not medically stable to d/c. Barriers: Not Clinically Stable-   Code Status :  -  Code Status: Full  Code   Family Communication:   plan of care discussed with patient's daughter [name],  patient's wife [name] at bedside they had multiple questions  DVT Prophylaxis  :   - SCDs   apixaban (ELIQUIS) tablet 2.5 mg Start: 10/08/22 1300 SCDs Start: 10/07/22 2054 apixaban (ELIQUIS) tablet 2.5 mg   Lab Results   Component Value Date   PLT 166 10/08/2022    Inpatient Medications  Scheduled Meds:  amLODipine  10 mg Oral Daily   apixaban  2.5 mg Oral BID   atorvastatin  80 mg Oral Daily   citalopram  20 mg Oral Daily   digoxin  125 mcg Oral Daily   folic acid  1 mg Oral Daily   losartan  25 mg Oral Daily   methocarbamol  500 mg Oral TID   metoprolol succinate  12.5 mg Oral BID   oxyCODONE  5 mg Oral Q8H   QUEtiapine  25 mg Oral BID   senna-docusate  2 tablet Oral QHS   tamsulosin  0.4 mg Oral Daily   thiamine  100 mg Oral Daily   Or   thiamine  100 mg Intravenous Daily   Continuous Infusions: PRN Meds:.acetaminophen **OR** acetaminophen, fentaNYL (SUBLIMAZE) injection, labetalol, LORazepam, ondansetron **OR** ondansetron (ZOFRAN) IV   Anti-infectives (From admission, onward)    None       Subjective: Ricardo Garrett today has no fevers, no emesis,  No chest pain,   - plan of care discussed with patient's daughter [name],  patient's wife [name] at bedside they had multiple questions - Poor intake due to altered mentation   Objective: Vitals:   10/09/22 1346 10/09/22 1835 10/09/22 2123 10/10/22 0539  BP: 135/80 (!) 146/89 135/68 (!) 148/75  Pulse: 86 91 92 72  Resp: '20 16 20 18  '$ Temp: 97.7 F (36.5 C) 100 F (37.8 C) 98.8 F (37.1 C) 97.9 F (36.6 C)  TempSrc: Oral  Oral Oral  SpO2: 98% 95% 91% 96%  Weight:      Height:        Intake/Output Summary (Last 24 hours) at 10/10/2022 1048 Last data filed at 10/10/2022 0900 Gross per 24 hour  Intake 590.11 ml  Output --  Net 590.11 ml   Filed Weights   10/07/22 1431 10/08/22 1030  Weight: 52.2 kg 56.7 kg    Physical Exam  Gen:-Lethargic,, appears to be alternating from agitation to lethargy depending on timing of medications HEENT:- [place].AT, No sclera icterus Neck-Supple Neck,No JVD,.  Lungs-  CTAB , fair symmetrical air movement CV- S1, S2 normal, regular  Abd-  +ve B.Sounds, Abd Soft, No tenderness,     Extremity-  pedal pulses present  Psych-affect is appropriate, oriented x3 Neuro-generalized weakness, limited exam due to altered mentation , no additional new focal deficits, no tremors MSK-right hip limited range of motion with pain with range of motion and palpation -Right wrist swelling, discomfort with range of motion  Data Reviewed: I have personally reviewed following labs and imaging studies  CBC: Recent Labs  Lab 10/07/22 1940 10/08/22 0437  WBC 9.0 5.6  NEUTROABS 6.7 4.0  HGB 11.1* 11.2*  HCT 32.4* 33.4*  MCV 100.3* 100.6*  PLT 180 762   Basic Metabolic Panel: Recent Labs  Lab 10/07/22 1940 10/08/22 0437 10/09/22 0454 10/10/22 0445  NA 134* 135 136 138  K 4.1 3.7 3.9 4.2  CL 102 106 109 113*  CO2 '24 22 22 22  '$ GLUCOSE 104* 93 104* 113*  BUN 26* 25* 26*  28*  CREATININE 1.73* 1.43* 1.65* 1.61*  CALCIUM 8.4* 8.0* 7.7* 8.0*  MG 1.5* 2.2  --   --    GFR: Estimated Creatinine Clearance: 30.3 mL/min (A) (by C-G formula based on SCr of 1.61 mg/dL (H)). Liver Function Tests: Recent Labs  Lab 10/08/22 0437  AST 17  ALT 13  ALKPHOS 91  BILITOT 1.1  PROT 5.8*  ALBUMIN 3.2*   Cardiac Enzymes: No results for input(s): "CKTOTAL", "CKMB", "CKMBINDEX", "TROPONINI" in the last 168 hours. BNP (last 3 results) No results for input(s): "PROBNP" in the last 8760 hours. HbA1C: No results for input(s): "HGBA1C" in the last 72 hours. Sepsis Labs: '@LABRCNTIP'$ (procalcitonin:4,lacticidven:4) )No results found for this or any previous visit (from the past 240 hour(s)).    Radiology Studies: DG Wrist 2 Views Right  Result Date: 10/09/2022 CLINICAL DATA:  Right wrist pain. EXAM: RIGHT WRIST - 2 VIEW COMPARISON:  None Available. FINDINGS: There is no evidence of fracture or dislocation. Mild ulna minus variance. Degenerative change of the thumb carpal metacarpal joint. Suspect remote fracture of the third proximal metacarpal. Subchondral cystic change in the distal radius  and ulna. Prominent vascular calcifications. Mild soft tissue edema. IMPRESSION: 1. No acute osseous abnormality. 2. Mild ulna minus variance and degenerative change of the radiocarpal and thumb carpometacarpal joint. Electronically Signed   By: Keith Rake M.D.   On: 10/09/2022 20:26   DG Shoulder 1V Right  Result Date: 10/09/2022 CLINICAL DATA:  Fall at home with right shoulder pain EXAM: RIGHT SHOULDER - 1 VIEW COMPARISON:  Chest radiograph dated 09/09/2022 FINDINGS: There is no evidence of fracture or dislocation. There is no evidence of arthropathy or other focal bone abnormality. Soft tissues are unremarkable. IMPRESSION: No acute fracture or dislocation. Electronically Signed   By: Darrin Nipper M.D.   On: 10/09/2022 11:37   DG Elbow 2 Views Right  Result Date: 10/09/2022 CLINICAL DATA:  Fall at home with right elbow pain EXAM: RIGHT ELBOW - 2 VIEW COMPARISON:  Right elbow radiographs dated 10/07/2022 FINDINGS: There is no evidence of fracture, dislocation, or joint effusion. There is no evidence of arthropathy or other focal bone abnormality. Soft tissues are unremarkable. IMPRESSION: No acute fracture or dislocation. Electronically Signed   By: Darrin Nipper M.D.   On: 10/09/2022 11:36     Scheduled Meds:  amLODipine  10 mg Oral Daily   apixaban  2.5 mg Oral BID   atorvastatin  80 mg Oral Daily   citalopram  20 mg Oral Daily   digoxin  125 mcg Oral Daily   folic acid  1 mg Oral Daily   losartan  25 mg Oral Daily   methocarbamol  500 mg Oral TID   metoprolol succinate  12.5 mg Oral BID   oxyCODONE  5 mg Oral Q8H   QUEtiapine  25 mg Oral BID   senna-docusate  2 tablet Oral QHS   tamsulosin  0.4 mg Oral Daily   thiamine  100 mg Oral Daily   Or   thiamine  100 mg Intravenous Daily   Continuous Infusions:   LOS: 3 days    Roxan Hockey M.D on 10/10/2022 at 10:48 AM  Go to [email or web address] - for contact info  [facility] - Office  [phone]  If 7PM-7AM, please  contact night-coverage [email or web address] 10/10/2022, 10:48 AM

## 2022-10-10 NOTE — Consult Note (Signed)
Palliative Care Consult Note                                  Date: 10/10/2022   Patient Name: Ricardo Garrett  DOB: [birth year]  MRN: [id number]  Age / Sex: 76 y.o., male  PCP: [name], MD Referring Physician: Roxan Hockey, MD  Reason for Consultation: Establishing goals of care  HPI/Patient Profile: 76 y.o. male  with past medical history of arthritis, coronary artery disease, CHF, atrial fibrillation, hypertension, alcohol use admitted on 10/07/2022 with right hip fracture after mechanical fall at home. During hospitalization patient found to have a hip fracture and altered mental status/multifactorial.  PMT was consulted for [place] conversations.  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 12/2015: echo showing reduced EF of 35-40% b. NST: 09/2017: scar with peri-infarct ischemia, intermediate-risk study   Chronic kidney disease, stage 3 unspecified ([place]) 01/19/2016   COPD (chronic obstructive pulmonary disease) ([place])    "was on RX when he had CHF; not on anything now" (11/23/2017)   Coronary artery disease involving native coronary artery of native heart with unstable angina pectoris (HCC)    High cholesterol    HOH (hard of hearing)    Hypertension    Hypoalbuminemia 01/19/2016   Mixed anxiety and depressive disorder 07/06/2022   Monoclonal gammopathy 01/14/2020   Persistent atrial fibrillation ([place])    a. s/p DCCV in 03/2016   Tachycardia-bradycardia syndrome ([place])     Subjective:   This NP [name] reviewed medical records, received report from team, assessed the patient and then meet at the  patient's bedside to discuss diagnosis, prognosis, GOC, EOL wishes disposition and options.  I met with the patient at the bedside, although he is not able to meaningfully interact.  I called the patient's daughter on the phone who indicated she would be coming back to the hospital later today.  I met her at the bedside at 2:45 pm.   Concept of Palliative Care was introduced as specialized medical care for people and their families living with serious illness.  If focuses on providing relief from the symptoms and stress of a serious illness.  The goal is to improve quality of life for both the patient and the family. Values and goals of care important to patient and family were attempted to be elicited.  Created space and opportunity for patient  and family to explore thoughts and feelings regarding current medical situation   Natural trajectory and current clinical status were discussed. Questions and concerns addressed. Patient  encouraged to call with questions or concerns.    Patient/Family Understanding of Illness: The patient's daughter knows she broke her hip and cannot have surgery. Having arm pain but no identified fracture (likely soft tissue injury such as strain/sprain). At baseline he tires easily, walks slow, has chronic leg pain from previous fracture. Not sure OP Palliative care with [facility] has been helpful, she describes a need for more "bedside" time support/care assistance. We had further discussion of current clinical issues.  Life  Review: The patient was a Games developer by trade, described as very hard working. He is ver independent, still driing prior to admission. He has been married for many years. Has 3 children: daughter Ricardo Garrett (present today, lives locally), daughter Ricardo Garrett (lives in [place]), and son Ricardo Garrett (not really "in the picture"). Things that bring him happiness include "tinkering", staying busy, walking the dog. He lives at home with his wife, daughter Ricardo Garrett  at least twice daily.  Patient Values: Being at home, independence, family. Though Ricardo Garrett states he would likely be agreeable to short term rehab (which he had after his leg fracture)  Goals: Remain at home  Today's Discussion: In addition to discussions described above, we had extensive discussion on multiple topics. The patient's daughter states they have a part time in ome caregiver to help. We discussed contributer to [place] may be medications, which we are trying to adjust to see if he can wake up more. Daughter states he has not been eating very much (which is understandable with his AMS).  Ricardo Garrett seems very overwhelmed and seems to be crying out for help. She is intermittently tearful. She has told her sister in [place] she needs her to help more. States her sister only wants to "sell all their possessions and put them in a home" which Ricardo Garrett is not wanting to do. She states she doesn't know where to turn, veryone is "pulling on me". She states the patient is a veteran and others have told her there may be help through the [place] but she is unaware on how to get assistance.   We discussed the need to set boundaries to care for herself. We discussed how hard it is to be a care giver. In addition Ricardo Garrett is a single mom who is partially raising grandchildren, working FT as a Quarry manager, and caring for her parents. She states she has started to tell people "no"  She asked about documents to help her in caring for her parents (paying bills, making decisions). We discuss HCPOA documentation (which the hospital can help with) and durable POA (which hospital can NOT help with). States they already have a will. I give her contact information to [employer] [place] to help with reduced/free legal assistance to complete these documents. However, today the patient is not able to sign these papers due to lethargy/confusion.  We discussed CODE STATUS and Ricardo Garrett states the patient has told her multiple times he does not want  to be on machines. I described the meaning of DNR (and it does NOT change the medical care provided except in the case of patient death) and she is agreeable. She states her mother (pt's wife) and other daughter would also agree to this. I did call and speak with the patient's spouse who redily agreed patient would want to be DNR.  Review of Systems  Unable to perform ROS: Mental status change    Objective:   Primary Diagnoses: Present on Admission:  Hip fracture ([place])  HLD (hyperlipidemia)   Physical Exam Vitals and nursing note reviewed.  Constitutional:      General: He is sleeping. He is not in acute distress.    Appearance: He is ill-appearing.  HENT:     Head: Normocephalic and atraumatic.  Cardiovascular:     Rate and Rhythm: Normal rate and regular rhythm.  Pulmonary:     Effort: Pulmonary effort is normal. No respiratory distress.     Breath sounds: Normal breath sounds.  Abdominal:  General: Abdomen is flat. Bowel sounds are normal.     Palpations: Abdomen is soft.     Tenderness: There is no abdominal tenderness.  Skin:    General: Skin is warm and dry.  Neurological:     Mental Status: He is lethargic.     Comments: Essentially non-interactive     Vital Signs:  BP (!) 149/74 (BP Location: Left Arm)   Pulse 62   Temp 98.4 F (36.9 C) (Oral)   Resp 18   Ht _0  (1.727 m)   Wt 56.7 kg   SpO2 98%   BMI 19.01 kg/m   Palliative Assessment/Data: 20-30%    Advanced Care Planning:   Existing Vynca/ACP Documentation: None  Primary Decision Maker: NEXT OF KIN  Code Status/Advance Care Planning: DNR  A discussion was had today regarding advanced directives. Concepts specific to code status, artifical feeding and hydration, continued IV antibiotics and rehospitalization was had.  The difference between a aggressive medical intervention path and a palliative comfort care path for this patient at this time was had.   Decisions/Changes to  ACP: Change to DNR  Assessment & Plan:   Impression: 76 year old male with chronic comorbidities and acute presentation as documented above.  Today when I saw him he was quite lethargic, essentially noninteractive.  Nursing notes that he was given Ativan at 4:00 this morning which is a scheduled dose.  It appears that his benzos have been changed to as needed and hospitalist recommended judicious use given lethargy, confusion, encephalopathy.  Question of underlying dementia component.  Not a surgical candidate for his hip fracture and recommended weightbearing as tolerated with reevaluation with x-rays after he is able to ambulate.  Not able to ambulate currently given somnolence.  He has previously been seen by outpatient palliative care at which point family noted a decrease in appetite, increasing sleep, increasing confusion, noted weight loss.  Remained a full code at that time.  Overall prognosis poor.  SUMMARY OF RECOMMENDATIONS   Change to DNR Continue to treat the treatable Judicious use of sedating medications Time for outcomes PMT will follow-up in a couple days for updates Please call us if we are needed before then  Symptom Management:  Per primary team PMT is available to assist as needed  Prognosis:  Unable to determine  Discharge Planning:  To Be Determined   Discussed with: Patient's family, medical team, nursing team    Thank you for allowing [place] to participate in the care of CHASE ARNALL PMT will continue to support holistically.  Time Total: 120 min  Greater than 50%  of this time was spent counseling and coordinating care related to the above assessment and plan.  Signed by: [name], NP Palliative Medicine Team  Team Phone # [phone] (Nights/Weekends)  10/10/2022, 1:44 PM

## 2022-10-10 NOTE — Progress Notes (Signed)
Pt yelling with personal cares, especially with any movement of right arm during dressing change and repositioning.  Pt grimacing with movement of right wrist.  Swelling noted to wrist.  Reviewed chart, pt did not have specific Xray of wrist.  Reviewed with Dr. Sidney Ace, orders for Xray received.

## 2022-10-10 NOTE — Progress Notes (Signed)
Pt more alert and is responding by opening eyes to voice. Attempted to feed patient but pt is resisting. Will try again at late time.

## 2022-10-10 NOTE — Progress Notes (Signed)
     Referral received for Ricardo Garrett for goals of care discussion. Chart reviewed and updates received from RN. Patient assessed and is unable to engage appropriately in discussions.   I was able to speak with patient's daughter Ricardo Garrett on the phone, who seemed tearful and overwhelmed. Began brief discussion of the situation. [name] meeting scheduled for today @ 2:30 pm (she will head this way in a little bit). Family is aware we will meet at patient's bedside.    Thank you for your referral and allowing PMT to assist in [place] care.   [name], NP Palliative Medicine Team Phone: [phone]  NO CHARGE

## 2022-10-10 NOTE — Progress Notes (Signed)
Per daughter request, notified with results of right wrist Xray

## 2022-10-10 NOTE — TOC Progression Note (Signed)
  Transition of Care [facility]) Screening Note   Patient Details  Name: Ricardo Garrett Date of Birth: Aug 13, 1946   [facility]) CM/SW Contact:    Boneta Lucks, RN Phone Number: 10/10/2022, 10:30 AM  Hip fracture, no surgery, PT eval pending.   Transition of Care Department [facility]) has reviewed patient and no TOC needs have been identified at this time. We will continue to monitor patient advancement through interdisciplinary progression rounds. If new patient transition needs arise, please place a TOC consult.    Expected Discharge Plan: Skilled Nursing Facility Barriers to Discharge: Continued Medical Work up  Expected Discharge Plan and Services Expected Discharge Plan: [place] arrangements for the past 2 months: [place]

## 2022-10-10 NOTE — Progress Notes (Signed)
Pt remains lethargic and responds to pain. Dressing changed on right arm and new malewick placed. Family at bedside with concerns regarding right arm and possible head injury due to fall. Pt also unable to swallow medication at this time. Medications placed on hold. MD notified

## 2022-10-10 NOTE — Progress Notes (Signed)
Pt repositioned and yelled out in pain during personal cares. Pt given PRN fentanyl to help with pain. Pt still has not eaten and is unable to take oral medications. MD notified

## 2022-10-10 NOTE — Progress Notes (Signed)
Pt lethargic this shift for nurse.  Did note, however, when pt daughter was leaving in the evening, pt woke up and told daughter goodbye.  Pt main source of pain appears to be in right arm, pt will yell with any movement of arm and facial grimacing present.  Morphine x 2 for pain and able to get HS oxycodone administered while pt was swallowing.  Repositioned for comfort.  Purewick in place.  AFIB on monitor.

## 2022-10-11 LAB — RENAL FUNCTION PANEL
Albumin: 2.8 g/dL — ABNORMAL LOW (ref 3.5–5.0)
Anion gap: 5 (ref 5–15)
BUN: 29 mg/dL — ABNORMAL HIGH (ref 8–23)
CO2: 21 mmol/L — ABNORMAL LOW (ref 22–32)
Calcium: 8.6 mg/dL — ABNORMAL LOW (ref 8.9–10.3)
Chloride: 115 mmol/L — ABNORMAL HIGH (ref 98–111)
Creatinine, Ser: 1.48 mg/dL — ABNORMAL HIGH (ref 0.61–1.24)
GFR, Estimated: 48 mL/min — ABNORMAL LOW (ref >60)
Glucose, Bld: 158 mg/dL — ABNORMAL HIGH (ref 70–99)
Phosphorus: 3.1 mg/dL (ref 2.5–4.6)
Potassium: 4.4 mmol/L (ref 3.5–5.1)
Sodium: 141 mmol/L (ref 135–145)

## 2022-10-11 LAB — CBC
HCT: 26.9 % — ABNORMAL LOW (ref 39.0–52.0)
Hemoglobin: 8.9 g/dL — ABNORMAL LOW (ref 13.0–17.0)
MCH: 34.5 pg — ABNORMAL HIGH (ref 26.0–34.0)
MCHC: 33.1 g/dL (ref 30.0–36.0)
MCV: 104.3 fL — ABNORMAL HIGH (ref 80.0–100.0)
Platelets: 158 10*3/uL (ref 150–400)
RBC: 2.58 MIL/uL — ABNORMAL LOW (ref 4.22–5.81)
RDW: 13.5 % (ref 11.5–15.5)
WBC: 8.4 10*3/uL (ref 4.0–10.5)
nRBC: 0 % (ref 0.0–0.2)

## 2022-10-11 MED ORDER — METHOCARBAMOL 1000 MG/10ML IJ SOLN
INTRAMUSCULAR | Status: AC
  Filled 2022-10-11: qty 10

## 2022-10-11 MED ORDER — METHOCARBAMOL 1000 MG/10ML IJ SOLN
500.0000 mg | Freq: Three times a day (TID) | INTRAVENOUS | Status: DC
  Administered 2022-10-11 – 2022-10-12 (×2): 500 mg via INTRAVENOUS
  Filled 2022-10-11: qty 5

## 2022-10-11 NOTE — Progress Notes (Signed)
Patient refused morning medications and to eat breakfast. Notified Dr. Roger Shelter .

## 2022-10-11 NOTE — TOC Initial Note (Signed)
Transition of Care [facility]) - Initial/Assessment Note    Patient Details  Name: Ricardo Garrett MRN: [id number] Date of Birth: 10-05-1946  Transition of Care [facility]) CM/SW Contact:    Boneta Lucks, RN Phone Number: 10/11/2022, 3:02 PM  Clinical Narrative:     Patient admitted with altered mental status and Fall with Hip fracture. PT is recommending SNF. TOC spoke with his wife. She is agreeable and ask that I speak with their daughter [name]. [name] works at [employer], states he has been at [facility] before and this is first choice.     She is upset that everyone is saying alcohol abuse, she states he drinks a beer or two every once in a while.  He has alcohol in his system because he took a drink of whiskey for the pain after his fall. " He has a broken hip."  TOC completed FL2 and sent out for bed offers.    Expected Discharge Plan: Skilled Nursing Facility Barriers to Discharge: Continued Medical Work up   Patient Goals and CMS Choice Patient states their goals for this hospitalization and ongoing recovery are:: wife agreeable to SNF CMS Medicare.gov Compare Post Acute Care list provided to:: Patient Represenative (must comment) Choice offered to / list presented to : Spouse  Expected Discharge Plan and Services Expected Discharge Plan: [place] arrangements for the past 2 months: Single Family Home                    Prior Living Arrangements/Services Living arrangements for the past 2 months: Single Family Home Lives with:: Spouse     Activities of Daily Living Home Assistive Devices/Equipment: None ADL Screening (condition at time of admission) Patient's cognitive ability adequate to safely complete daily activities?: Yes Is the patient deaf or have difficulty hearing?: Yes Does the patient have difficulty seeing, even when wearing glasses/contacts?: No Does the patient have difficulty concentrating, remembering, or making decisions?:  Yes Patient able to express need for assistance with ADLs?: Yes Does the patient have difficulty dressing or bathing?: No Independently performs ADLs?: Yes (appropriate for developmental age) Does the patient have difficulty walking or climbing stairs?: Yes Weakness of Legs: Right Weakness of Arms/Hands: None  Permission Sought/Granted     Emotional Assessment       Orientation: : Oriented to Self Alcohol / Substance Use: Alcohol Use (Not daily per daughter) Psych Involvement: No (comment)  Admission diagnosis:  Hip fracture ([place]) [S72.009A] Closed fracture of trochanter of right femur, initial encounter ([place]) [S72.101A] Patient Active Problem List   Diagnosis Date Noted   Altered mental status 10/09/2022    Class: Acute   Hip fracture ([place]) 10/07/2022   Alcohol use 10/07/2022   Hypomagnesemia 10/07/2022   Paroxysmal atrial fibrillation ([place]) 10/07/2022   Essential hypertension 10/07/2022   HLD (hyperlipidemia) 10/07/2022   Fall at home, initial encounter 10/07/2022   Anemia 09/28/2022   Mixed anxiety and depressive disorder 07/06/2022   Alcohol abuse 09/14/2021   Chronic combined systolic and diastolic heart failure ([place]) 03/10/2021   Monoclonal gammopathy 01/14/2020   Coronary arteriosclerosis 10/08/2018   Unstable angina ([place]) 11/23/2017   Coronary artery disease involving native coronary artery of native heart with unstable angina pectoris ([place])    Essential (primary) hypertension 70/26/3785   Acute systolic CHF (congestive heart failure) ([place]) 88/50/2774   Acute systolic heart failure ([place]) 01/21/2016   Atrial fibrillation with rapid ventricular response ([place]) 01/20/2016   Anxiety disorder  01/19/2016   COPD (chronic obstructive pulmonary disease) ([place]) 01/19/2016   Anasarca 01/19/2016   CKD (chronic kidney disease) stage 3, GFR 30-59 ml/min (HCC) 01/19/2016   Chronic kidney disease, stage 3 unspecified ([place]) 01/19/2016   Difficulty in walking(719.7) 04/29/2013    Other and unspecified hyperlipidemia 03/19/2013   Anticoagulated 03/19/2013   Acute blood loss anemia 03/16/2013   Intertrochanteric fracture of right hip ([place]) 03/14/2013   Intertrochanteric fracture ([place]) 03/14/2013   PCP:  [name], MD Pharmacy:   [place], [place] - [address] [place] [address] Phone: [phone] Fax: [phone]  [facility] - [place], [place] - [address] [place] [address] Phone: [phone] Fax: [phone]    Readmission Risk Interventions    10/11/2022    2:59 PM  Readmission Risk Prevention Plan  Transportation Screening Complete  PCP or Specialist Appt within 3-5 Days Not Complete  HRI or [place] Complete  Social Work Consult for [place] Planning/Counseling Complete  Palliative Care Screening Not Applicable  Medication Review Press photographer) Complete

## 2022-10-11 NOTE — NC FL2 (Signed)
[place] LEVEL OF CARE FORM     IDENTIFICATION  Patient Name: Ricardo Garrett Birthdate: September 01, 1946 Sex: male Admission Date (Current Location): 10/07/2022  [facility] and [place] Number:  [employer] and Address:  [facility] [address] [place]      Provider Number: [id number]  Attending Physician Name and Address:  Deatra Romuald, MD  Relative Name and Phone Number:  Zolnowski,Cheryl  Daughter  [phone]  [phone]    Current Level of Care: Hospital Recommended Level of Care: [place] Prior Approval Number:    Date Approved/Denied:   PASRR Number: [id number]  Discharge Plan: SNF    Current Diagnoses: Patient Active Problem List   Diagnosis Date Noted   Altered mental status 10/09/2022   Hip fracture ([place]) 10/07/2022   Alcohol use 10/07/2022   Hypomagnesemia 10/07/2022   Paroxysmal atrial fibrillation ([place]) 10/07/2022   Essential hypertension 10/07/2022   HLD (hyperlipidemia) 10/07/2022   Fall at home, initial encounter 10/07/2022   Anemia 09/28/2022   Mixed anxiety and depressive disorder 07/06/2022   Alcohol abuse 09/14/2021   Chronic combined systolic and diastolic heart failure ([place]) 03/10/2021   Monoclonal gammopathy 01/14/2020   Coronary arteriosclerosis 10/08/2018   Unstable angina ([place]) 11/23/2017   Coronary artery disease involving native coronary artery of native heart with unstable angina pectoris ([place])    Essential (primary) hypertension 95/32/0233   Acute systolic CHF (congestive heart failure) ([place]) 43/56/8616   Acute systolic heart failure ([place]) 01/21/2016   Atrial fibrillation with rapid ventricular response ([place]) 01/20/2016   Anxiety disorder 01/19/2016   COPD (chronic obstructive pulmonary disease) ([place]) 01/19/2016   Anasarca 01/19/2016   CKD (chronic kidney disease) stage 3, GFR 30-59 ml/min (HCC) 01/19/2016   Chronic kidney disease, stage 3 unspecified ([place])  01/19/2016   Difficulty in walking(719.7) 04/29/2013   Other and unspecified hyperlipidemia 03/19/2013   Anticoagulated 03/19/2013   Acute blood loss anemia 03/16/2013   Intertrochanteric fracture of right hip ([place]) 03/14/2013   Intertrochanteric fracture ([place]) 03/14/2013    Orientation RESPIRATION BLADDER Height & Weight     Self  Normal Continent Weight: 56.7 kg Height:   (pt is not alert enough to tell me his weight at this time)  BEHAVIORAL SYMPTOMS/MOOD NEUROLOGICAL BOWEL NUTRITION STATUS      Continent Diet (See DC Summary)  AMBULATORY STATUS COMMUNICATION OF NEEDS Skin   Extensive Assist Verbally Skin abrasions, Bruising                       Personal Care Assistance Level of Assistance  Bathing, Dressing, Feeding Bathing Assistance: Maximum assistance Feeding assistance: Independent Dressing Assistance: Maximum assistance     Functional Limitations Info  Sight, Hearing, Speech Sight Info: Adequate Hearing Info: Impaired Speech Info: Adequate    SPECIAL CARE FACTORS FREQUENCY  PT (By licensed PT)     PT Frequency: 5 times a week              Contractures Contractures Info: Not present    Additional Factors Info  Code Status, Allergies Code Status Info: DNR Allergies Info: NKDA           Current Medications (10/11/2022):  This is the current hospital active medication list Current Facility-Administered Medications  Medication Dose Route Frequency Provider Last Rate Last Admin   acetaminophen (TYLENOL) tablet 650 mg  650 mg Oral Q6H PRN Zierle-Ghosh, Asia B, DO       Or  acetaminophen (TYLENOL) suppository 650 mg  650 mg Rectal Q6H PRN Zierle-Ghosh, Asia B, DO       amLODipine (NORVASC) tablet 10 mg  10 mg Oral Daily Shahmehdi, Seyed A, MD   10 mg at 10/09/22 1246   apixaban (ELIQUIS) tablet 2.5 mg  2.5 mg Oral BID Skipper Cliche A, MD   2.5 mg at 10/10/22 2346   atorvastatin (LIPITOR) tablet 80 mg  80 mg Oral Daily Zierle-Ghosh, Asia B, DO    80 mg at 10/09/22 1246   citalopram (CELEXA) tablet 20 mg  20 mg Oral Daily Zierle-Ghosh, Asia B, DO   20 mg at 10/09/22 1246   digoxin (LANOXIN) tablet 125 mcg  125 mcg Oral Daily Zierle-Ghosh, Asia B, DO   125 mcg at 10/09/22 1246   fentaNYL (SUBLIMAZE) injection 25 mcg  25 mcg Intravenous Q2H PRN Roxan Hockey, MD   25 mcg at 39/03/00 9233   folic acid (FOLVITE) tablet 1 mg  1 mg Oral Daily Emokpae, Courage, MD       labetalol (NORMODYNE) injection 10 mg  10 mg Intravenous Q4H PRN Emokpae, Courage, MD       LORazepam (ATIVAN) injection 0.5 mg  0.5 mg Intravenous Q8H PRN Emokpae, Courage, MD       losartan (COZAAR) tablet 25 mg  25 mg Oral Daily Shahmehdi, Seyed A, MD       methocarbamol (ROBAXIN) tablet 500 mg  500 mg Oral TID Roxan Hockey, MD   500 mg at 10/10/22 2346   metoprolol succinate (TOPROL-XL) 24 hr tablet 12.5 mg  12.5 mg Oral BID Shahmehdi, Seyed A, MD   12.5 mg at 10/10/22 2346   ondansetron (ZOFRAN) tablet 4 mg  4 mg Oral Q6H PRN Zierle-Ghosh, Asia B, DO       Or   ondansetron (ZOFRAN) injection 4 mg  4 mg Intravenous Q6H PRN Zierle-Ghosh, Asia B, DO   4 mg at 10/08/22 0076   oxyCODONE (Oxy IR/ROXICODONE) immediate release tablet 5 mg  5 mg Oral Q8H Shahmehdi, Seyed A, MD   5 mg at 10/10/22 2346   QUEtiapine (SEROQUEL) tablet 25 mg  25 mg Oral QHS Emokpae, Courage, MD       senna-docusate (Senokot-S) tablet 2 tablet  2 tablet Oral QHS Emokpae, Courage, MD       tamsulosin (FLOMAX) capsule 0.4 mg  0.4 mg Oral Daily Zierle-Ghosh, Asia B, DO   0.4 mg at 10/09/22 1248   thiamine (VITAMIN B1) tablet 100 mg  100 mg Oral Daily Zierle-Ghosh, Asia B, DO   100 mg at 10/09/22 1252   Or   thiamine (VITAMIN B1) injection 100 mg  100 mg Intravenous Daily Zierle-Ghosh, Asia B, DO   100 mg at 10/11/22 0944     Discharge Medications: Please see discharge summary for a list of discharge medications.  Relevant Imaging Results:  Relevant Lab Results:   Additional Information SS#  [id number]  Boneta Lucks, RN

## 2022-10-11 NOTE — Progress Notes (Signed)
Patient has been confused this shift, with periods of lethargy . He is combative at times. Unable to arouse patient enough to eat or take PO medications, he arouses at times to pain . Oral care provided throughout shift.

## 2022-10-11 NOTE — Plan of Care (Signed)
  Problem: Acute Rehab PT Goals(only PT should resolve) Goal: Pt Will Go Supine/Side To Sit Outcome: Progressing Flowsheets (Taken 10/11/2022 1403) Pt will go Supine/Side to Sit: with moderate assist Goal: Patient Will Transfer Sit To/From Stand Outcome: Progressing Flowsheets (Taken 10/11/2022 1403) Patient will transfer sit to/from stand: with moderate assist Goal: Pt Will Transfer Bed To Chair/Chair To Bed Outcome: Progressing Flowsheets (Taken 10/11/2022 1403) Pt will Transfer Bed to Chair/Chair to Bed: with mod assist Goal: Pt Will Ambulate Outcome: Progressing Flowsheets (Taken 10/11/2022 1403) Pt will Ambulate:  10 feet  with moderate assist  with rolling walker   2:04 PM, 10/11/22 Lonell Grandchild, MPT Physical Therapist with [facility] [phone] office [phone] mobile phone

## 2022-10-11 NOTE — Progress Notes (Signed)
Pt was flagged due to previous LOC. Vitals rechecked and stable.   10/11/22 0521 10/11/22 0539  Assess: MEWS Score  Temp (!) 96.8 F (36 C) 97.9 F (36.6 C)  BP (!) 162/93 (!) 155/87  MAP (mmHg) 109 107  Pulse Rate 88 86  Resp 18 20  Level of Consciousness  --  Alert (agitated at this time)  SpO2 94 % 94 %  O2 Device Nasal Cannula Nasal Cannula  O2 Flow Rate (L/min) 2 L/min 2 L/min  Assess: MEWS Score  MEWS Temp 1 0  MEWS Systolic 0 0  MEWS Pulse 0 0  MEWS RR 0 0  MEWS LOC 1 0  MEWS Score 2 0  MEWS Score Color Yellow Green  Assess: if the MEWS score is Yellow or Red  Were vital signs taken at a resting state? Yes  --   Focused Assessment No change from prior assessment  --   Does the patient meet 2 or more of the SIRS criteria? No  --   MEWS guidelines implemented *See Row Information* No, vital signs rechecked  --   Document  Patient Outcome Stabilized after interventions  --   Progress note created (see row info) Yes  --   Assess: SIRS CRITERIA  SIRS Temperature  0 0  SIRS Pulse 0 0  SIRS Respirations  0 0  SIRS WBC 0 0  SIRS Score Sum  0 0

## 2022-10-11 NOTE — Progress Notes (Signed)
Patients BP soft at this time , he is yelling out in pain , was going to give fentanyl he was combative with NT 's when doing peri-care on patient. Notified Dr. Roger Shelter      10/11/22 1339  Vitals  BP (!) 89/56  MAP (mmHg) 66  BP Location Left Leg  BP Method Automatic  Patient Position (if appropriate) Lying  Pulse Rate 74  MEWS COLOR  MEWS Score Color Yellow  Oxygen Therapy  SpO2 94 %  O2 Device Nasal Cannula  MEWS Score  MEWS Temp 0  MEWS Systolic 1  MEWS Pulse 0  MEWS RR 0  MEWS LOC 1  MEWS Score 2

## 2022-10-11 NOTE — Evaluation (Signed)
Physical Therapy Evaluation Patient Details Name: Ricardo Garrett MRN: [id number] DOB: 14-Oct-1946 Today's Date: 10/11/2022  History of Present Illness  Ricardo Garrett is a 75 y.o. male with medical history significant of arthritis, coronary artery disease, CHF, atrial fibrillation, hypertension, alcohol use, presents to the ED with a chief complaint of mechanical fall.  Patient is unfortunately not able to provide any history to me.  Per chart review he reported that he was walking his dog when he had a mechanical fall.  He reports that he fell because he was trying to move too quickly after the dog made him angry.  He landed on the side of his body.  His home health nurse arrived an hour later and called his daughter and they decided to bring him into the ED.  He was complaining of pain in his right hip and he was found to have a hip fracture.  Patient is on Eliquis and Plavix and Ortho requested that we hold those.   Clinical Impression  Patient demonstrates slow labored movement for sitting up at bedside with c/o severe pain in right hip and RUE.  Patient unable to move RLE or functionally use RUE due to c/o severe pain, RUE presents bruised and wrapped with dressing and unable to lift against gravity.  Patient unable to stand after multiple attempts and limited mostly due to pain and apprehension.  Patient put back to bed with 2 person Max assist to reposition.  Patient will benefit from continued skilled physical therapy in hospital and recommended venue below to increase strength, balance, endurance for safe ADLs and gait.         Recommendations for follow up therapy are one component of a multi-disciplinary discharge planning process, led by the attending physician.  Recommendations may be updated based on patient status, additional functional criteria and insurance authorization.  Follow Up Recommendations Skilled nursing-short term rehab (<3 hours/day) Can patient physically be  transported by private vehicle: No    Assistance Recommended at Discharge Intermittent Supervision/Assistance  Patient can return home with the following  A lot of help with bathing/dressing/bathroom;A lot of help with walking and/or transfers;Help with stairs or ramp for entrance;Assistance with cooking/housework    Equipment Recommendations Rolling walker (2 wheels)  Recommendations for Other Services       Functional Status Assessment Patient has had a recent decline in their functional status and demonstrates the ability to make significant improvements in function in a reasonable and predictable amount of time.     Precautions / Restrictions Precautions Precautions: Fall Restrictions Weight Bearing Restrictions: Yes RLE Weight Bearing: Weight bearing as tolerated Other Position/Activity Restrictions: limited right hip abduction and extension      Mobility  Bed Mobility Overal bed mobility: Needs Assistance Bed Mobility: Supine to Sit, Sit to Supine     Supine to sit: Max assist Sit to supine: Max assist   General bed mobility comments: poor tolerance for sitting up at bedside with frequent falling backwards    Transfers                        Ambulation/Gait                  Stairs            Wheelchair Mobility    Modified Rankin (Stroke Patients Only)       Balance Overall balance assessment: Needs assistance Sitting-balance support: Feet supported, No upper extremity supported Sitting  balance-Leahy Scale: Poor Sitting balance - Comments: fair/poor seated at EOB Postural control: Posterior lean                                   Pertinent Vitals/Pain Pain Assessment Pain Assessment: Faces Faces Pain Scale: Hurts whole lot Pain Location: right hip and RUE with pressure or movement Pain Descriptors / Indicators: Grimacing, Guarding, Sore, Moaning (screaming) Pain Intervention(s): Limited activity within  patient's tolerance, Monitored during session, Premedicated before session, Repositioned    Home Living Family/patient expects to be discharged to:: Private residence                   Additional Comments: Patient is a poor historian and did not answer questions    Prior Function               Mobility Comments: household and short distanced community ambulator per chart (was walking dog when fall occured) ADLs Comments: Indpendent     Hand Dominance        Extremity/Trunk Assessment   Upper Extremity Assessment Upper Extremity Assessment: Generalized weakness;RUE deficits/detail RUE Deficits / Details: grossly 2+/5 mostly due to pain RUE: Unable to fully assess due to pain RUE Sensation: WNL RUE Coordination: WNL    Lower Extremity Assessment Lower Extremity Assessment: Generalized weakness;RLE deficits/detail RLE Deficits / Details: grossly 2+/5 RLE: Unable to fully assess due to pain RLE Sensation: WNL RLE Coordination: WNL    Cervical / Trunk Assessment Cervical / Trunk Assessment: Normal  Communication   Communication: Other (comment) (mostly non-verbal other than screaming when in pain)  Cognition Arousal/Alertness: Awake/alert, Lethargic Behavior During Therapy: Anxious, Flat affect Overall Cognitive Status: No family/caregiver present to determine baseline cognitive functioning                                          General Comments      Exercises     Assessment/Plan    PT Assessment Patient needs continued PT services  PT Problem List Decreased strength;Decreased activity tolerance;Decreased balance;Decreased mobility       PT Treatment Interventions DME instruction;Gait training;Stair training;Functional mobility training;Therapeutic activities;Therapeutic exercise;Patient/family education;Balance training    PT Goals (Current goals can be found in the Care Plan section)  Acute Rehab PT Goals Patient Stated Goal:  return home PT Goal Formulation: With patient Time For Goal Achievement: 10/25/22 Potential to Achieve Goals: Good    Frequency Min 3X/week     Co-evaluation               AM-PAC PT "6 Clicks" Mobility  Outcome Measure Help needed turning from your back to your side while in a flat bed without using bedrails?: A Lot Help needed moving from lying on your back to sitting on the side of a flat bed without using bedrails?: A Lot Help needed moving to and from a bed to a chair (including a wheelchair)?: Total Help needed standing up from a chair using your arms (e.g., wheelchair or bedside chair)?: Total Help needed to walk in hospital room?: Total Help needed climbing 3-5 steps with a railing? : Total 6 Click Score: 8    End of Session Equipment Utilized During Treatment: Oxygen Activity Tolerance: Patient limited by fatigue;Patient limited by pain;Patient limited by lethargy;Treatment limited secondary to agitation Patient left: in bed;with  call bell/phone within reach;with bed alarm set Nurse Communication: Mobility status PT Visit Diagnosis: Unsteadiness on feet (R26.81);Other abnormalities of gait and mobility (R26.89);Muscle weakness (generalized) (M62.81)    Time: 8469-6295 PT Time Calculation (min) (ACUTE ONLY): 26 min   Charges:   PT Evaluation $PT Eval Moderate Complexity: 1 Mod PT Treatments $Therapeutic Activity: 23-37 mins        2:02 PM, 10/11/22 Lonell Grandchild, MPT Physical Therapist with [facility] [phone] office [phone] mobile phone

## 2022-10-11 NOTE — Progress Notes (Signed)
     Referral previously received for Ricardo Garrett for goals of care discussion. Noted most recent palliative in-person assessment dated 10/10/22 at which time it was recommended to follow from a distance/chart check until follow-up 10/12/22. At last visit patient remained lethargic, somnolent; meds being adjusted to see if he can wake up more.  Chart reviewed for Recent provider notes, nurse notes, TOC notes, vitals, labs, and imaging and updates received from RN.   At this time patient appears improved, seems he has been more awake and alert. Fluctuating MEWS yellow to green. PT eval completed and at this time recommending SNF/rehab, continued RUE pain (see previous notes on imaging). Mobility seems limited by pain/apprehension. PT plans continued therapy inpatient. Labs appear stable, some decline in hgb but not requiring transfusion, kidney function stable, no leukocytosis or thrombocytopenia.   At this time plan for in person palliative follow-up tomorrow. Please contact the palliative medicine provider on service for any new/urgent needs that require our assistance with this patient in the meantime.  Thank you for your referral and allowing PMT to assist in Ricardo Garrett care.   [name], NP Palliative Medicine Team Phone: [phone]  NO CHARGE

## 2022-10-11 NOTE — Progress Notes (Signed)
PROGRESS NOTE    Patient: Ricardo Garrett                            PCP: [name], MD                    DOB: 04-Nov-1945            DOA: 10/07/2022 JME:[id number]             DOS: 10/11/2022, 12:30 PM   LOS: 4 days   Date of Service: The patient was seen and examined on 10/11/2022  Subjective:   The patient was seen and examined this morning, stable much more awake alert oriented, following commands.  He has been agitated and confused --mentation has improved     Brief Narrative:   Ricardo Garrett is a 76 y.o. male with medical history significant of arthritis, coronary artery disease, CHF, atrial fibrillation, hypertension, alcohol use, presents to the ED with a chief complaint of mechanical fall.  Patient is unfortunately not able to provide any history to me.  Per chart review he reported that he was walking his dog when he had a mechanical fall.  He reported to ED that he fell because he was trying to move too quickly after the dog made him angry.  He landed on the side of his body.  His home health nurse arrived an hour later and called his daughter and they decided to bring him into the ED.  He was complaining of pain in his right hip and he was found to have a hip fracture.  Patient is on Eliquis and Plavix and Ortho requested that we hold those.  Ed : Temp 98, heart rate 58-61, RR 16-18, BP 106/69-147/79, satting at 100% No leukocytosis with WBC 9.0, hemoglobin 11.1, platelets 180 Chemistry is unremarkable aside from an elevated creatinine which is near to baseline with a previous creatinine being 1.81 and before that 1.71 Alcohol level 77 CT right hip shows acute mildly comminuted fracture of right greater trochanter encompassing the attachment sites of the gluteus medius and minimus.  Fracture plane extends down to upper margin of plate but does not appear to extend further along the prosthetic Patient was started on CIWA protocol given thiamine, 1 L bolus,  Percocet Unfortunately patient was given Ativan via CIWA and was not able to provide any history    Assessment & Plan:   Principal Problem:   Altered mental status Active Problems:   Hip fracture (HCC)   Alcohol use   Hypomagnesemia   Paroxysmal atrial fibrillation (HCC)   Essential hypertension   HLD (hyperlipidemia)   Fall at home, initial encounter     Assessment and Plan: * Altered mental status -Mentation has improved -Multifactorial, underlying dementia -Confusion agitation; due to sundowning, pain, analgesics, possible alcohol withdrawal -added Seroquel nightly,  optimizing pain management -As needed Haldol   Hip fracture (HCC) -)Right Hip Fracture CT right hip shows acute mildly comminuted fracture of the greater trochanter in ipsilateral side as previous hip surgery  -Orthopedic surgeon Dr Amedeo Kinsman recommends nonoperative management -P.o. oxycodone alternating with IV fentanyl as needed for pain control -Methocarbamol as ordered -Get PT OT after adequate pain control -Patient will most likely need SNF level rehab -Orthopedic surgeon recommends:-weightbearing as tolerated using a walker, with limited hip extension and abduction.  After he has had a chance to ambulate, we will plan to repeat x-rays.  this  can be completed in clinic as outpatient  - Weight Bearing Status/Activity: Protected weightbearing as tolerated, using a walker at all times.  Limited hip extension and hip abduction  Fall at home, initial encounter -Described as a mechanical fall to ED staff -Resulting hip fracture -not a surgical case -Recurrent falls---PTA pt lived at home and did very poorly, patient has significant limitations with mobility related ADLs- this patient needs to continue to be monitored in the hospital until a SNF bed is obtained as she is not safe to go home with her current physcical limitations   HLD (hyperlipidemia) -Continue statin  Essential hypertension -Continue  norvasc, losartan and metoprolol -As needed hydralazine  Paroxysmal atrial fibrillation (HCC) -Continue digoxin and metoprolol -Will resume his Eliquis, will hold Plavix -continue to monitor  Hypomagnesemia -Mag was 1.5 -Was repleted -Monitor magnesium level  Alcohol use - Mentation has improved -Per daughter she is not very concerned about his alcohol consumption--mainly confusion and dementia -EtOH 77 -CIWA -Continue to monitor       ------------------------------------------------------------------------------------------------------------------------------------------------  DVT prophylaxis:  apixaban (ELIQUIS) tablet 2.5 mg Start: 10/08/22 1300 SCDs Start: 10/07/22 2054 apixaban (ELIQUIS) tablet 2.5 mg   Code Status:   Code Status: DNR  Family Communication: No family member present at bedside- attempt will be made to update daily The above findings and plan of care has been discussed with patient (and family)  in detail,  they expressed understanding and agreement of above. -Advance care planning has been discussed.   Admission status:   Status is: Inpatient Remains inpatient appropriate because: Needing close evaluation, IV fluids, orthopedic evaluation, pain management     Procedures:   No admission procedures for hospital encounter.   Antimicrobials:  Anti-infectives (From admission, onward)    None        Medication:   amLODipine  10 mg Oral Daily   apixaban  2.5 mg Oral BID   atorvastatin  80 mg Oral Daily   citalopram  20 mg Oral Daily   digoxin  125 mcg Oral Daily   folic acid  1 mg Oral Daily   losartan  25 mg Oral Daily   methocarbamol  500 mg Oral TID   metoprolol succinate  12.5 mg Oral BID   oxyCODONE  5 mg Oral Q8H   QUEtiapine  25 mg Oral QHS   senna-docusate  2 tablet Oral QHS   tamsulosin  0.4 mg Oral Daily   thiamine  100 mg Oral Daily   Or   thiamine  100 mg Intravenous Daily    acetaminophen **OR** acetaminophen,  fentaNYL (SUBLIMAZE) injection, labetalol, LORazepam, ondansetron **OR** ondansetron (ZOFRAN) IV   Objective:   Vitals:   10/10/22 1420 10/10/22 2022 10/11/22 0521 10/11/22 0539  BP: (!) 146/80 (!) 167/87 (!) 162/93 (!) 155/87  Pulse: 89 95 88 86  Resp: '14 18 18 20  '$ Temp: 98.3 F (36.8 C) 98.2 F (36.8 C) (!) 96.8 F (36 C) 97.9 F (36.6 C)  TempSrc: Axillary     SpO2: 100% 94% 94% 94%  Weight:      Height:        Intake/Output Summary (Last 24 hours) at 10/11/2022 1230 Last data filed at 10/11/2022 0900 Gross per 24 hour  Intake 213.62 ml  Output 1100 ml  Net -886.38 ml   Filed Weights   10/07/22 1431 10/08/22 1030  Weight: 52.2 kg 56.7 kg     Examination:     General:  AAO x 1, confused, mildly agitated,  seems to be moaning in pain and upper extremity area   HEENT:  Normocephalic, PERRL, otherwise with in Normal limits   Neuro:  CNII-XII intact. , normal motor and sensation, reflexes intact   Lungs:   Clear to auscultation BL, Respirations unlabored,  No wheezes / crackles  Cardio:    S1/S2, RRR, No murmure, No Rubs or Gallops   Abdomen:  Soft, non-tender, bowel sounds active all four quadrants, no guarding or peritoneal signs.  Muscular  skeletal:  Right hip discomfort, limited range of motion due to pain Limited exam -right hip pain, limited range of motion due to pain and fracture  Otherwise global generalized weaknesses - in bed, able to move all 3 extremities,   2+ pulses,  symmetric, No pitting edema  Skin:  Dry, warm to touch, negative for any Rashes,  Wounds: Please see nursing documentation         ------------------------------------------------------------------------------------------------------------------------------------------    LABs:     Latest Ref Rng & Units 10/11/2022    3:22 AM 10/08/2022    4:37 AM 10/07/2022    7:40 PM  CBC  WBC 4.0 - 10.5 K/uL 8.4  5.6  9.0   Hemoglobin 13.0 - 17.0 g/dL 8.9  11.2  11.1   Hematocrit 39.0  - 52.0 % 26.9  33.4  32.4   Platelets 150 - 400 K/uL 158  166  180       Latest Ref Rng & Units 10/11/2022    3:22 AM 10/10/2022    4:45 AM 10/09/2022    4:54 AM  CMP  Glucose 70 - 99 mg/dL 158  113  104   BUN 8 - 23 mg/dL '29  28  26   '$ Creatinine 0.61 - 1.24 mg/dL 1.48  1.61  1.65   Sodium 135 - 145 mmol/L 141  138  136   Potassium 3.5 - 5.1 mmol/L 4.4  4.2  3.9   Chloride 98 - 111 mmol/L 115  113  109   CO2 22 - 32 mmol/L '21  22  22   '$ Calcium 8.9 - 10.3 mg/dL 8.6  8.0  7.7        Micro Results No results found for this or any previous visit (from the past 240 hour(s)).  Radiology Reports No results found.  SIGNED: Deatra Cyprian, MD, FHM. [facility],  Pager (please use [email or web address] to page/text) Please use Epic Secure Chat for non-urgent communication (7AM-7PM)  If 7PM-7AM, please contact night-coverage [email or web address], 10/11/2022, 12:30 PM

## 2022-10-12 ENCOUNTER — Inpatient Hospital Stay (HOSPITAL_COMMUNITY): Payer: Medicare HMO

## 2022-10-12 MED ORDER — SODIUM CHLORIDE 0.9 % IV SOLN: INTRAVENOUS | Status: DC

## 2022-10-12 MED ORDER — ACETAMINOPHEN 650 MG RE SUPP: 650.0000 mg | Freq: Four times a day (QID) | RECTAL | Status: DC | PRN

## 2022-10-12 MED ORDER — METHOCARBAMOL 1000 MG/10ML IJ SOLN: 500.0000 mg | Freq: Three times a day (TID) | INTRAVENOUS | Status: DC | PRN

## 2022-10-12 MED ORDER — ACETAMINOPHEN 500 MG PO TABS: 1000.0000 mg | ORAL_TABLET | Freq: Three times a day (TID) | ORAL | Status: DC | PRN

## 2022-10-12 MED ORDER — HYDROMORPHONE HCL 1 MG/ML IJ SOLN
1.0000 mg | INTRAMUSCULAR | Status: DC | PRN
  Administered 2022-10-12 – 2022-10-13 (×5): 1 mg via INTRAVENOUS
  Filled 2022-10-12 (×5): qty 1

## 2022-10-12 NOTE — Progress Notes (Signed)
Daily Progress Note   Patient Name: Ricardo Garrett       Date: 10/12/2022 DOB: 11-27-1945  Age: 76 y.o. MRN#: [id number] Attending Physician: Deatra Lemuel, MD Primary Care Physician: [name], MD Admit Date: 10/07/2022 Length of Stay: 5 days  Reason for Consultation/Follow-up: Establishing goals of care  HPI/Patient Profile:  76 y.o. male  with past medical history of arthritis, coronary artery disease, CHF, atrial fibrillation, hypertension, alcohol use admitted on 10/07/2022 with right hip fracture after mechanical fall at home. During hospitalization patient found to have a hip fracture and altered mental status/multifactorial.   PMT was consulted for [place] conversations.  Subjective:   Subjective: Chart Reviewed. Updates received. Patient Assessed. Created space and opportunity for patient  and family to explore thoughts and feelings regarding current medical situation.  Today's Discussion: Today I saw the patient at the bedside, no family was present.  However, I was able to speak with the patient's daughter she was leaving the hospital just prior to going to see the patient.  She states that started working on placement at a skilled nursing facility with TOC.  She has specifically requested [facility].  She does bring up a concern that she wonders if the patient has UTI because he cries out in pain whenever he urinates.  I informed her I would like to the nurse and hospitalist now.  I also encouraged her to utilize resources to help care for her father and get papers in order (such as Anthony, healthcare power of attorney, etc. per previous consult note).  The patient is resting comfortably in the bed.  He does awaken to voice and attempts to have a conversation, although his speech is difficult to understand and he seems still a bit confused.  I was able to confirm he can tell me his full name (oriented x 1).  He does not overtly admit any pain or nausea  when asked.  On palpation of his abdomen and specifically lower abdomen toward his bladder does not seem to elicit any physical response indicating pain.  Overall he seems much more awake than he was a couple days ago.  I provided emotional and general support through therapeutic listening, empathy, sharing of stories, therapeutic touch, and other techniques. I answered all questions and addressed all concerns to the best of my ability.  Review of Systems  Unable to perform ROS: Mental status change    Objective:   Vital Signs:  BP (!) 146/89 (BP Location: Left Arm)   Pulse 96   Temp 97.9 F (36.6 C) (Axillary)   Resp 16   Ht '5\' 8"'$  (1.727 m)   Wt 56.7 kg   SpO2 100%   BMI 19.01 kg/m   Physical Exam: Physical Exam Vitals and nursing note reviewed.  Constitutional:      General: He is not in acute distress.    Appearance: He is ill-appearing.  HENT:     Head: Normocephalic and atraumatic.  Cardiovascular:     Rate and Rhythm: Normal rate and regular rhythm.  Pulmonary:     Effort: Pulmonary effort is normal. No respiratory distress.  Abdominal:     General: Abdomen is flat. Bowel sounds are normal. There is no distension.     Palpations: Abdomen is soft.     Tenderness: There is no abdominal tenderness.  Skin:    General: Skin is warm and dry.  Neurological:     Mental Status: He is disoriented and  confused.  Psychiatric:        Mood and Affect: Mood normal.        Behavior: Behavior normal.     Palliative Assessment/Data: 20-30%    Existing Vynca/ACP Documentation: None  Assessment & Plan:   Impression: Present on Admission:  Hip fracture (HCC)  HLD (hyperlipidemia)  76 year old male with chronic comorbidities and acute presentation as documented above. When I saw him a couple days ago he was quite lethargic, essentially noninteractive. Nursing notes that he was given Ativan at 4:00 this morning which is a scheduled dose. It appears that his benzos have  been changed to as needed and hospitalist recommended judicious use given lethargy, confusion, encephalopathy. Question of underlying dementia component. Not a surgical candidate for his hip fracture and recommended weightbearing as tolerated with reevaluation with x-rays after he is able to ambulate.  He was not able to ambulate at my first visit given somnolence. He has previously been seen by outpatient palliative care at which point family noted a decrease in appetite, increasing sleep, increasing confusion, noted weight loss.  Today he is much more awake, TOC working on transition to SNF for rehab given fractures and nonsurgical candidacy.  Remains a DNR at this time per previous discussions. Overall long-term prognosis poor.   SUMMARY OF RECOMMENDATIONS   Change to DNR Continue to treat the treatable Judicious use of sedating medications Continue to work toward discharge to SNF/rehab Recommend continued outpatient palliative care for anticipated decline PMT will continue to follow  Symptom Management:  Per primary team PMT is available to assist as needed  Code Status: DNR  Prognosis: Unable to determine  Discharge Planning: [facility] for rehab with Palliative care service follow-up  Discussed with: Patient, family, medical team, nursing team, [facility] team  Thank you for allowing [place] to participate in the care of AARISH ROCKERS PMT will continue to support holistically.  Billing based on MDM: High  Problems Addressed: One acute or chronic illness or injury that poses a threat to life or bodily function  Amount and/or Complexity of Data: Category 3:Discussion of management or test interpretation with external physician/other qualified health care professional/appropriate source (not separately reported)  Risks: Decision not to resuscitate or to de-escalate care because of poor prognosis (confirmed DNR)  [name], NP Palliative Medicine Team  Team Phone #  [phone] (Nights/Weekends)  06/29/2021, 8:17 AM

## 2022-10-12 NOTE — Progress Notes (Signed)
PROGRESS NOTE    Patient: Ricardo Garrett                            PCP: [name], MD                    DOB: [birth year]            DOA: 10/07/2022 ZOX:[id number]             DOS: 10/12/2022, 11:18 AM   LOS: 5 days   Date of Service: The patient was seen and examined on 10/12/2022  Subjective:   The patient was seen and examined, hemodynamically stable this morning Excessively sleepy difficult to arouse  Discussed with 1 daughter at the bedside and another on the phone They wish to reduce pain and sedation medication  Patient has been confused and agitated overnight      Brief Narrative:   Ricardo Garrett is a 76 y.o. male with medical history significant of arthritis, coronary artery disease, CHF, atrial fibrillation, hypertension, alcohol use, presents to the ED with a chief complaint of mechanical fall.  Patient is unfortunately not able to provide any history to me.  Per chart review he reported that he was walking his dog when he had a mechanical fall.  He reported to ED that he fell because he was trying to move too quickly after the dog made him angry.  He landed on the side of his body.  His home health nurse arrived an hour later and called his daughter and they decided to bring him into the ED.  He was complaining of pain in his right hip and he was found to have a hip fracture.  Patient is on Eliquis and Plavix and Ortho requested that we hold those.  Ed : Temp 98, heart rate 58-61, RR 16-18, BP 106/69-147/79, satting at 100% No leukocytosis with WBC 9.0, hemoglobin 11.1, platelets 180 Chemistry is unremarkable aside from an elevated creatinine which is near to baseline with a previous creatinine being 1.81 and before that 1.71 Alcohol level 77 CT right hip shows acute mildly comminuted fracture of right greater trochanter encompassing the attachment sites of the gluteus medius and minimus.  Fracture plane extends down to upper margin of plate but does not appear  to extend further along the prosthetic Patient was started on CIWA protocol given thiamine, 1 L bolus, Percocet Unfortunately patient was given Ativan via CIWA and was not able to provide any history    Assessment & Plan:   Principal Problem:   Hip fracture ([place]) Active Problems:   Fall at home, initial encounter   Altered mental status   Alcohol use   Hypomagnesemia   Paroxysmal atrial fibrillation (HCC)   Essential hypertension   HLD (hyperlipidemia)     Assessment and Plan: * Hip fracture ([place]) -)Right Hip Fracture CT right hip shows acute mildly comminuted fracture of the greater trochanter in ipsilateral side as previous hip surgery  -Orthopedic surgeon Dr Amedeo Kinsman recommends nonoperative management -P.o. oxycodone alternating with IV fentanyl as needed for pain control -Methocarbamol as ordered -Get PT OT after adequate pain control -Patient will most likely need SNF level rehab -Orthopedic surgeon recommends:-weightbearing as tolerated using a walker, with limited hip extension and abduction.  After he has had a chance to ambulate, we will plan to repeat x-rays.  this can be completed in clinic as outpatient  - Weight Bearing Status/Activity:  Protected weightbearing as tolerated, using a walker at all times.  Limited hip extension and hip abduction  Altered mental status Mentation waxes and wanes, worsening at night with agitation, confusion -Multifactorial, underlying dementia -Confusion agitation; due to sundowning, pain, analgesics, possible alcohol withdrawal -added Seroquel nightly,  optimizing pain management (medication has been modified and reduced per family request) -As needed Haldol   Fall at home, initial encounter -Described as a mechanical fall to ED staff -Resulting hip fracture -not a surgical case -Recurrent falls---PTA pt lived at home and did very poorly, patient has significant limitations with mobility related ADLs- this patient needs to continue  to be monitored in the hospital until a SNF bed is obtained as she is not safe to go home with her current physcical limitations   HLD (hyperlipidemia) -Continue statin  Essential hypertension -Continue norvasc, losartan and metoprolol -As needed hydralazine  Paroxysmal atrial fibrillation (HCC) -Continue digoxin and metoprolol -Will resume his Eliquis, will hold Plavix -continue to monitor  Hypomagnesemia -Mag was 1.5 -Was repleted -Monitor magnesium level  Alcohol use - Agitated confused overnight -Patient has been receiving pain medications, sedatives -Per request by daughters would like to reduce sedative and pain medications Requested to discontinue Ativan -Per daughter she is not very concerned about his alcohol consumption--mainly confusion and dementia -EtOH was at 22 on admission -CIWA --discontinued per family request -Continue to monitor       ------------------------------------------------------------------------------------------------------------------------------------------------  DVT prophylaxis:  apixaban (ELIQUIS) tablet 2.5 mg Start: 10/08/22 1300 SCDs Start: 10/07/22 2054 apixaban (ELIQUIS) tablet 2.5 mg   Code Status:   Code Status: DNR  Family Communication: Daughters were updated at bedside The above findings and plan of care has been discussed with patient (and family)  in detail,  they expressed understanding and agreement of above. -Advance care planning has been discussed.   Disposition:,   From home pending SNF discharge (pending insurance approval)  Admission status:   Status is: Inpatient Remains inpatient appropriate because: Needing close evaluation, IV fluids, orthopedic evaluation, pain management     Procedures:   No admission procedures for hospital encounter.   Antimicrobials:  Anti-infectives (From admission, onward)    None        Medication:   amLODipine  10 mg Oral Daily   apixaban  2.5 mg Oral BID    atorvastatin  80 mg Oral Daily   citalopram  20 mg Oral Daily   digoxin  125 mcg Oral Daily   folic acid  1 mg Oral Daily   losartan  25 mg Oral Daily   metoprolol succinate  12.5 mg Oral BID   oxyCODONE  5 mg Oral Q8H   QUEtiapine  25 mg Oral QHS   senna-docusate  2 tablet Oral QHS   tamsulosin  0.4 mg Oral Daily   thiamine  100 mg Oral Daily   Or   thiamine  100 mg Intravenous Daily    acetaminophen **OR** acetaminophen, HYDROmorphone (DILAUDID) injection, labetalol, methocarbamol (ROBAXIN) IV, ondansetron **OR** ondansetron (ZOFRAN) IV   Objective:   Vitals:   10/11/22 1400 10/11/22 2026 10/12/22 0439 10/12/22 0920  BP: (!) 161/99 (!) 156/96 (!) 161/83 (!) 146/89  Pulse:  (!) 51 88 96  Resp:  '18 18 16  '$ Temp:  98.2 F (36.8 C) 98.2 F (36.8 C) 97.9 F (36.6 C)  TempSrc:    Axillary  SpO2:  93% 99% 100%  Weight:      Height:        Intake/Output Summary (  Last 24 hours) at 10/12/2022 1118 Last data filed at 10/12/2022 6789 Gross per 24 hour  Intake 172.74 ml  Output 600 ml  Net -427.26 ml   Filed Weights   10/07/22 1431 10/08/22 1030  Weight: 52.2 kg 56.7 kg     Examination:     General:  AAO x 1, excessively somnolent, difficult to arouse  HEENT:  Normocephalic, PERRL, otherwise with in Normal limits   Neuro:  Limited exam -somnolent-CNII-XII intact. , normal motor and sensation, reflexes intact   Lungs:   Clear to auscultation BL, Respirations unlabored,  No wheezes / crackles  Cardio:    S1/S2, RRR, No murmure, No Rubs or Gallops   Abdomen:  Soft, non-tender, bowel sounds active all four quadrants, no guarding or peritoneal signs.  Muscular  skeletal:  Right hip pain, therefore limited range of motion  Limited exam -global generalized weaknesses - in bed, able to move all 4 extremities,   2+ pulses,  symmetric, No pitting edema  Skin:  Dry, warm to touch, negative for any Rashes,  Wounds: Please see nursing documentation           ------------------------------------------------------------------------------------------------------------------------------------------    LABs:     Latest Ref Rng & Units 10/11/2022    3:22 AM 10/08/2022    4:37 AM 10/07/2022    7:40 PM  CBC  WBC 4.0 - 10.5 K/uL 8.4  5.6  9.0   Hemoglobin 13.0 - 17.0 g/dL 8.9  11.2  11.1   Hematocrit 39.0 - 52.0 % 26.9  33.4  32.4   Platelets 150 - 400 K/uL 158  166  180       Latest Ref Rng & Units 10/11/2022    3:22 AM 10/10/2022    4:45 AM 10/09/2022    4:54 AM  CMP  Glucose 70 - 99 mg/dL 158  113  104   BUN 8 - 23 mg/dL '29  28  26   '$ Creatinine 0.61 - 1.24 mg/dL 1.48  1.61  1.65   Sodium 135 - 145 mmol/L 141  138  136   Potassium 3.5 - 5.1 mmol/L 4.4  4.2  3.9   Chloride 98 - 111 mmol/L 115  113  109   CO2 22 - 32 mmol/L '21  22  22   '$ Calcium 8.9 - 10.3 mg/dL 8.6  8.0  7.7        Micro Results No results found for this or any previous visit (from the past 240 hour(s)).  Radiology Reports No results found.  SIGNED: Deatra Laymon, MD, FHM. [facility],  Pager (please use [email or web address] to page/text) Please use Epic Secure Chat for non-urgent communication (7AM-7PM)  If 7PM-7AM, please contact night-coverage [email or web address], 10/12/2022, 11:18 AM

## 2022-10-12 NOTE — Progress Notes (Signed)
Able to get pt to take a couple bites of applesauce with pain medication on as well as a couple sips of water.  Difficult to get patient to follow commands.  Pt verbal this shift with nonsensical conversation.  Hollering out in pain at times, IV fentanyl x 2 and ativan x 1, eventually obtaining relief of symptoms.  Pt remains on O2 at 2L.  Afib on monitor.  Purewick in place.

## 2022-10-12 NOTE — Care Management Important Message (Signed)
Important Message  Patient Details  Name: Ricardo Garrett MRN: [id number] Date of Birth: 01-22-1946   Medicare Important Message Given:  N/A - LOS <3 / Initial given by admissions     Tommy Medal 10/12/2022, 10:45 AM

## 2022-10-12 NOTE — Progress Notes (Signed)
Patient still drowsy but seems maybe a little more alert than this morning. Patients family at bedside feeding lunch patient tolerated about 1/2 of his lunch fed by family with no coughing afterwards was able to give oxycodone po that way. Bed alarm still on. IV pain med given see MAR. Will continue to monitor patient. Bed in lowest position. Fresh dressing intact on RUE.

## 2022-10-12 NOTE — TOC Progression Note (Signed)
Transition of Care [facility]) - Progression Note    Patient Details  Name: Ricardo Garrett MRN: [id number] Date of Birth: [birth year]  Transition of Care [facility]) CM/SW Contact  Boneta Lucks, RN Phone Number: 10/12/2022, 10:34 AM  Clinical Narrative:   Bed offers discussed with [name], He is agreeing to [facility]. CMA started INS AUTH with [name] updated. Patient is medically ready, avoidable days added.    Expected Discharge Plan: [place] Barriers to Discharge: Continued Medical Work up  Expected Discharge Plan and Services Expected Discharge Plan: [place] arrangements for the past 2 months: Single Family Home                    Readmission Risk Interventions    10/11/2022    2:59 PM  Readmission Risk Prevention Plan  Transportation Screening Complete  PCP or Specialist Appt within 3-5 Days Not Complete  HRI or [place] Complete  Social Work Consult for [place] Planning/Counseling Complete  Palliative Care Screening Not Applicable  Medication Review Press photographer) Complete

## 2022-10-13 LAB — URINALYSIS, ROUTINE W REFLEX MICROSCOPIC
Bilirubin Urine: NEGATIVE
Glucose, UA: NEGATIVE mg/dL
Hgb urine dipstick: NEGATIVE
Ketones, ur: NEGATIVE mg/dL
Leukocytes,Ua: NEGATIVE
Nitrite: NEGATIVE
Protein, ur: >=300 mg/dL — AB
Specific Gravity, Urine: 1.02 (ref 1.005–1.030)
pH: 5 (ref 5.0–8.0)

## 2022-10-13 MED ORDER — OXYCODONE HCL 5 MG PO TABS: 5.0000 mg | ORAL_TABLET | Freq: Three times a day (TID) | ORAL | Status: DC | PRN

## 2022-10-13 MED ORDER — ALPRAZOLAM 0.25 MG PO TABS
0.2500 mg | ORAL_TABLET | Freq: Two times a day (BID) | ORAL | Status: DC | PRN
  Administered 2022-10-14 – 2022-10-15 (×3): 0.25 mg via ORAL
  Filled 2022-10-13 (×3): qty 1

## 2022-10-13 MED ORDER — METHOCARBAMOL 500 MG PO TABS
500.0000 mg | ORAL_TABLET | Freq: Three times a day (TID) | ORAL | Status: DC
  Administered 2022-10-13 – 2022-10-17 (×11): 500 mg via ORAL
  Filled 2022-10-13 (×13): qty 1

## 2022-10-13 MED ORDER — SODIUM CHLORIDE 0.9 % IV SOLN
1.0000 g | INTRAVENOUS | Status: DC
  Administered 2022-10-13 – 2022-10-16 (×4): 1 g via INTRAVENOUS
  Filled 2022-10-13 (×4): qty 10

## 2022-10-13 MED ORDER — DEXTROSE-NACL 5-0.45 % IV SOLN: INTRAVENOUS | Status: DC

## 2022-10-13 MED ORDER — METOPROLOL TARTRATE 5 MG/5ML IV SOLN
5.0000 mg | Freq: Four times a day (QID) | INTRAVENOUS | Status: DC | PRN
  Administered 2022-10-13 – 2022-10-14 (×3): 5 mg via INTRAVENOUS
  Filled 2022-10-13 (×3): qty 5

## 2022-10-13 MED ORDER — HYDROMORPHONE HCL 1 MG/ML IJ SOLN
0.5000 mg | INTRAMUSCULAR | Status: DC | PRN
  Administered 2022-10-13 – 2022-10-17 (×4): 0.5 mg via INTRAVENOUS
  Filled 2022-10-13 (×4): qty 0.5

## 2022-10-13 MED ORDER — ACETAMINOPHEN 325 MG PO TABS
650.0000 mg | ORAL_TABLET | Freq: Four times a day (QID) | ORAL | Status: DC
  Administered 2022-10-13 – 2022-10-17 (×14): 650 mg via ORAL
  Filled 2022-10-13 (×16): qty 2

## 2022-10-13 MED ORDER — METOPROLOL TARTRATE 5 MG/5ML IV SOLN
2.5000 mg | Freq: Four times a day (QID) | INTRAVENOUS | Status: DC | PRN
  Administered 2022-10-13: 2.5 mg via INTRAVENOUS
  Filled 2022-10-13: qty 5

## 2022-10-13 NOTE — Progress Notes (Signed)
Yellow Mews, Charge nurse Donney Rankins made aware.    10/13/22 0432  Assess: MEWS Score  Temp 99.5 F (37.5 C)  BP 128/72  MAP (mmHg) 87  Pulse Rate (!) 103  SpO2 (!) 88 %  O2 Device Nasal Cannula  O2 Flow Rate (L/min) 3 L/min  Assess: MEWS Score  MEWS Temp 0  MEWS Systolic 0  MEWS Pulse 1  MEWS RR 0  MEWS LOC 1  MEWS Score 2  MEWS Score Color Yellow  Assess: if the MEWS score is Yellow or Red  Were vital signs taken at a resting state? Yes  Focused Assessment No change from prior assessment  Does the patient meet 2 or more of the SIRS criteria? No  MEWS guidelines implemented *See Row Information* No, altered LOC is baseline  Treat  MEWS Interventions Consulted Respiratory Therapy (per resiratory, put on 6L nasal cannula and RT will assess shortly.)  Assess: SIRS CRITERIA  SIRS Temperature  0  SIRS Pulse 1  SIRS Respirations  0  SIRS WBC 0  SIRS Score Sum  1

## 2022-10-13 NOTE — Progress Notes (Signed)
The patient will not keep his nasal cannula in his nose. Myself and the nurse tech Joy have had to place the oxygen back on the patient when rounding. The patient's oxygen saturation is 88% on 3L/Min at this time. MD aware from previous secure chat message.

## 2022-10-13 NOTE — Progress Notes (Signed)
MD Adefeso notified via secure chat @ [phone] that the patient had a heart rate in the 130"s on telemetry monitor. MD responded 28 minutes later via secure chat. MD aware that the patient is unable to safely swallow any PO medications at this time.See MAR.  No new orders given

## 2022-10-13 NOTE — Progress Notes (Signed)
Patient continues to be a yellow mews at this time.   10/13/22 0616  Assess: MEWS Score  Temp 99.3 F (37.4 C)  BP (!) 135/90  MAP (mmHg) 104  Pulse Rate 94  Resp (!) 23  SpO2 96 %  O2 Device HFNC  O2 Flow Rate (L/min) 6 L/min  Assess: MEWS Score  MEWS Temp 0  MEWS Systolic 0  MEWS Pulse 0  MEWS RR 1  MEWS LOC 1  MEWS Score 2  MEWS Score Color Yellow  Assess: if the MEWS score is Yellow or Red  Were vital signs taken at a resting state? Yes  Focused Assessment No change from prior assessment  Does the patient meet 2 or more of the SIRS criteria? No  MEWS guidelines implemented *See Row Information* No, altered LOC is baseline  Treat  MEWS Interventions Other (Comment) (continue with current treatments)  Pain Scale Faces  Pain Score Asleep  Pain Type Acute pain  Pain Location Hip  Pain Orientation Right  Negative Vocalization 0  Facial Expression 0  Body Language 0  Consolability 0  Complains of Other (Comment) (patient resting quietly at this time)  Take Vital Signs  Increase Vital Sign Frequency  Yellow: Q 2hr X 2 then Q 4hr X 2, if remains yellow, continue Q 4hrs  Escalate  MEWS: Escalate Yellow: discuss with charge nurse/RN and consider discussing with provider and RRT  Assess: SIRS CRITERIA  SIRS Temperature  0  SIRS Pulse 1  SIRS Respirations  1  SIRS WBC 0  SIRS Score Sum  2

## 2022-10-13 NOTE — Progress Notes (Signed)
Patient has AFib and having a sustained heart rate in 130's. Medications interventions given. See MAR. Patient is moving around in bed and pulling off oxygen, and pulling at telemetry monitor cords.

## 2022-10-13 NOTE — Progress Notes (Signed)
Patient took all of his PO medications@ 2200. Ate 1 cup of chocolate ice cream and drank 1/2 of cup of water. Tolerated well.

## 2022-10-13 NOTE — Progress Notes (Signed)
Ricardo Garrett  Charge RN Donney Rankins aware   10/13/22 2011  Assess: Garrett Score  Temp (!) 97.5 F (36.4 C)  BP (!) 155/102  MAP (mmHg) 120  Pulse Rate (!) 136  Resp (!) 22  SpO2 94 %  O2 Device Nasal Cannula  O2 Flow Rate (L/min) 4 L/min  Assess: Garrett Score  Garrett Temp 0  Garrett Systolic 0  Garrett Pulse 3  Garrett RR 1  Garrett LOC 1  Garrett Score 5  Garrett Score Color Ricardo  Assess: if the Garrett score is Yellow or Ricardo  Were vital signs taken at a resting state? Yes  Focused Assessment No change from prior assessment  Does the patient meet 2 or more of the SIRS criteria? No  Does the patient have a confirmed or suspected source of infection? No  Garrett guidelines implemented *See Row Information* No, altered LOC is baseline  Treat  Garrett Interventions Administered scheduled meds/treatments  Pain Scale Faces  Pain Score 3  Pain Type Acute pain  Pain Location Hip  Pain Orientation Right  Notify: Charge Nurse/RN  Name of Charge Nurse/RN Notified Letina  Date Charge Nurse/RN Notified 10/13/22  Time Charge Nurse/RN Notified 2011  Assess: SIRS CRITERIA  SIRS Temperature  0  SIRS Pulse 1  SIRS Respirations  1  SIRS WBC 0  SIRS Score Sum  2

## 2022-10-13 NOTE — Progress Notes (Signed)
Patient is yellow mews. Consulted with respiratory, spoke with Bridgett.  Advised to place patient on 6L of oxygen via nasal cannula and she would be here to assess patient momentarily.

## 2022-10-13 NOTE — Progress Notes (Signed)
RT called for low O2 sat. Upon arrival patient was 88% on 6 lpm nasal cannula. Switched patient over to HFNC at 8 lpm sats came up to 92%.

## 2022-10-13 NOTE — Progress Notes (Signed)
PROGRESS NOTE    Patient: Ricardo Garrett                            PCP: [name], MD                    DOB: 1946/04/18            DOA: 10/07/2022 [id number]             DOS: 10/13/2022, 12:25 PM   LOS: 6 days   Date of Service: The patient was seen and examined on 10/13/2022  Subjective:   The patient was seen and examined, hemodynamically stable this morning  -Episodes of confusion and agitation alternating with lethargy persist  -LPN Brook at bedside -[name] and daughter [name] at bedside -Oral intake remains poor    Brief Narrative:   ZACKORY PUDLO is a 76 y.o. male with medical history significant of arthritis, coronary artery disease, CHF, atrial fibrillation, hypertension, alcohol use, presents to the ED with a chief complaint of mechanical fall.  Patient is unfortunately not able to provide any history to me.  Per chart review he reported that he was walking his dog when he had a mechanical fall.  He reported to ED that he fell because he was trying to move too quickly after the dog made him angry.  He landed on the side of his body.  His home health nurse arrived an hour later and called his daughter and they decided to bring him into the ED.  He was complaining of pain in his right hip and he was found to have a hip fracture.  Patient is on Eliquis and Plavix and Ortho requested that we hold those.  Ed : Temp 98, heart rate 58-61, RR 16-18, BP 106/69-147/79, satting at 100% No leukocytosis with WBC 9.0, hemoglobin 11.1, platelets 180 Chemistry is unremarkable aside from an elevated creatinine which is near to baseline with a previous creatinine being 1.81 and before that 1.71 Alcohol level 77 CT right hip shows acute mildly comminuted fracture of right greater trochanter encompassing the attachment sites of the gluteus medius and minimus.  Fracture plane extends down to upper margin of plate but does not appear to extend further along the  prosthetic Patient was started on CIWA protocol given thiamine, 1 L bolus, Percocet Unfortunately patient was given Ativan via CIWA and was not able to provide any history    Assessment & Plan:   Principal Problem:   Hip fracture ([place]) Active Problems:   Fall at home, initial encounter   Altered mental status   Alcohol use   Hypomagnesemia   Paroxysmal atrial fibrillation (HCC)   Essential hypertension   HLD (hyperlipidemia)   Assessment and Plan: 1)Hip fracture ([place]) - Right Hip Fracture CT right hip shows acute mildly comminuted fracture of the greater trochanter in ipsilateral side as previous hip surgery  -Orthopedic surgeon Dr Amedeo Kinsman recommends nonoperative management -P.o. oxycodone alternating with IV fentanyl as needed for pain control -Methocarbamol as ordered -Get PT OT after adequate pain control -Patient will most likely need SNF level rehab -Orthopedic surgeon recommends:-weightbearing as tolerated using a walker, with limited hip extension and abduction.  After he has had a chance to ambulate, we will plan to repeat x-rays.  this can be completed in clinic as outpatient  - Weight Bearing Status/Activity: Protected weightbearing as tolerated, using a walker at all times.  Limited  hip extension and hip abduction  2) acute metabolic encephalopathy----multifactorial -[name] [name] and patient's daughter [name] reports history of cognitive deficits/decline at baseline... -some h/o alcohol use..  Currently receiving benzos and opiates - CT head from 09/19/2022 and repeat CTA from 10/12/2022 without acute intracranial abnormalities -Will be more judicious with opiates and benzos at this time  3)Fall at home, initial encounter -Described as a mechanical fall to ED staff -Resulting hip fracture -not a surgical case -Recurrent falls---PTA pt lived at home and did very poorly, patient has significant limitations with mobility related ADLs- this patient needs to  continue to be monitored in the hospital until a SNF bed is obtained as she is not safe to go home with her current physcical limitations    4)Possible UTI--UA suspicious for possible UTI treat empirically with IV Rocephin pending culture data  5)FEN--poor oral intake, give nutritional supplements and IV fluids until oral intake for intake improves -Avoid excessive daytime sedation to allow for patient to be awake enough to have adequate oral intake  6)PAFib--continue digoxin and metoprolol for rate control -Continue Eliquis for stroke prophylaxis   7) alcohol use concerns- Blood alcohol level was 77 on admission -Please see #2 above   8)HLD (hyperlipidemia) -Continue statin  9)HTN-- -continue amlodipine 10 mg daily, losartan 25 mg daily, and metoprolol XL 12.5 mg twice daily -IV labetalol as needed elevated BP  10)Hypomagnesemia -- Replace and recheck  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 --Improving with hydration - renally adjust medications, avoid nephrotoxic agents / dehydration  / hypotension  12)social/ethics--- plan of care discussed with patient's daughters Danae Orleans,   patient's wife [name] at bedside they had multiple questions -Palliative care consult appreciated patient is a DNR/DNI   13)status post fall with musculoskeletal injury/pain--concerns about shoulder elbow and wrist pain-- -right wrist right shoulder and right elbow x-rays without acute findings -Pain management as above -PT OT eval appreciated   14)Depression/mood disorder--- stop Seroquel due to oversedation,, continue Celexa -Be judicious with benzos   ------------------------------------------------------------------------------------------------------------------------------------------------  DVT prophylaxis:  apixaban (ELIQUIS) tablet 2.5 mg Start: 10/08/22 1300 SCDs Start: 10/07/22 2054 apixaban (ELIQUIS) tablet 2.5 mg   Code Status:   Code Status: DNR  Family Communication: Daughter  Magdalene River and Wife [name])  were updated at bedside The above findings and plan of care has been discussed with patient (and family)  in detail,  they expressed understanding and agreement of above. -Advance care planning has been discussed.   Disposition:,   From home pending SNF discharge (pending insurance approval)  Admission status:   Status is: Inpatient Remains inpatient appropriate because: Needing close evaluation, IV fluids, orthopedic evaluation, pain management   Procedures:   Antimicrobials:  Anti-infectives (From admission, onward)    Start     Dose/Rate Route Frequency Ordered Stop   10/13/22 0800  cefTRIAXone (ROCEPHIN) 1 g in sodium chloride 0.9 % 100 mL IVPB        1 g 200 mL/hr over 30 Minutes Intravenous Every 24 hours 10/13/22 0720        Medication:   amLODipine  10 mg Oral Daily   apixaban  2.5 mg Oral BID   atorvastatin  80 mg Oral Daily   citalopram  20 mg Oral Daily   digoxin  125 mcg Oral Daily   folic acid  1 mg Oral Daily   losartan  25 mg Oral Daily   metoprolol succinate  12.5 mg Oral BID   oxyCODONE  5 mg Oral Q8H  QUEtiapine  25 mg Oral QHS   senna-docusate  2 tablet Oral QHS   tamsulosin  0.4 mg Oral Daily   thiamine  100 mg Oral Daily   Or   thiamine  100 mg Intravenous Daily    acetaminophen **OR** acetaminophen, HYDROmorphone (DILAUDID) injection, labetalol, methocarbamol (ROBAXIN) IV, metoprolol tartrate, ondansetron **OR** ondansetron (ZOFRAN) IV   Objective:   Vitals:   10/13/22 0505 10/13/22 0616 10/13/22 0800 10/13/22 0953  BP:  (!) 135/90    Pulse:  94    Resp:  (!) 23    Temp:  99.3 F (37.4 C)    TempSrc:  Axillary    SpO2: (!) 88% 96% 96% 93%  Weight:      Height:        Intake/Output Summary (Last 24 hours) at 10/13/2022 1225 Last data filed at 10/13/2022 0900 Gross per 24 hour  Intake 660.83 ml  Output 300 ml  Net 360.83 ml   Filed Weights   10/07/22 1431 10/08/22 1030  Weight: 52.2 kg 56.7  kg    Examination:    Physical Exam  Gen:- somewhat sleepy, episodes of agitation from time to time in no acute distress  HEENT:- [place].AT, No sclera icterus Neck-Supple Neck,No JVD,.  Lungs-  CTAB , fair air movement bilaterally  CV- S1, S2 normal, RRR Abd-  +ve B.Sounds, Abd Soft, No tenderness,    Neuro-generalized weakness, limited exam due to altered mentation , no additional new focal deficits, no tremors Extremities- good pulses,  MSK-right hip limited range of motion with pain with range of motion and palpation -Right wrist swelling is improving, discomfort with range of motion  ------------------------------------------------------------------------------------------------------------------------------------------   LABs:     Latest Ref Rng & Units 10/11/2022    3:22 AM 10/08/2022    4:37 AM 10/07/2022    7:40 PM  CBC  WBC 4.0 - 10.5 K/uL 8.4  5.6  9.0   Hemoglobin 13.0 - 17.0 g/dL 8.9  11.2  11.1   Hematocrit 39.0 - 52.0 % 26.9  33.4  32.4   Platelets 150 - 400 K/uL 158  166  180       Latest Ref Rng & Units 10/11/2022    3:22 AM 10/10/2022    4:45 AM 10/09/2022    4:54 AM  CMP  Glucose 70 - 99 mg/dL 158  113  104   BUN 8 - 23 mg/dL '29  28  26   '$ Creatinine 0.61 - 1.24 mg/dL 1.48  1.61  1.65   Sodium 135 - 145 mmol/L 141  138  136   Potassium 3.5 - 5.1 mmol/L 4.4  4.2  3.9   Chloride 98 - 111 mmol/L 115  113  109   CO2 22 - 32 mmol/L '21  22  22   '$ Calcium 8.9 - 10.3 mg/dL 8.6  8.0  7.7    Radiology Reports CT HEAD WO CONTRAST (5MM)  Result Date: 10/12/2022 CLINICAL DATA:  Altered level of consciousness, agitation, confusion EXAM: CT HEAD WITHOUT CONTRAST TECHNIQUE: Contiguous axial images were obtained from the base of the skull through the vertex without intravenous contrast. RADIATION DOSE REDUCTION: This exam was performed according to the departmental dose-optimization program which includes automated exposure control, adjustment of the mA and/or kV  according to patient size and/or use of iterative reconstruction technique. COMPARISON:  10/10/2022 FINDINGS: Brain: Chronic small vessel ischemic changes are again seen within the periventricular white matter and basal ganglia, stable. No signs of acute infarct or  hemorrhage. Stable calcified structure within the fourth ventricle, with no evidence of hydrocephalus. The lateral ventricles and remaining midline structures are stable. No acute extra-axial fluid collections. No mass effect. Vascular: No hyperdense vessel or unexpected calcification. Skull: Normal. Negative for fracture or focal lesion. Sinuses/Orbits: No acute finding. Other: None. IMPRESSION: 1. Stable exam, no acute intracranial process. 2. 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 Electronically Signed   By: Randa Ngo M.D.   On: 10/12/2022 15:22   DG CHEST PORT 1 VIEW  Result Date: 10/12/2022 CLINICAL DATA:  Shortness of breath. EXAM: PORTABLE CHEST 1 VIEW COMPARISON:  09/09/2022 and CT chest 07/27/2022. FINDINGS: Trachea is midline. Heart size is accentuated by AP technique. Pacemaker lead tips are in the right atrium and right ventricle. Mild diffuse interstitial prominence and indistinctness. Probable small right pleural effusion. IMPRESSION: Mild congestive heart failure. Electronically Signed   By: Lorin Picket M.D.   On: 10/12/2022 14:12    SIGNED: Roxan Hockey, MD,  [facility],  Pager (please use [email or web address] to page/text) Please use Epic Secure Chat for non-urgent communication (7AM-7PM)  If 7PM-7AM, please contact night-coverage [email or web address], 10/13/2022, 12:25 PM

## 2022-10-13 NOTE — Progress Notes (Signed)
Patient is on telemetry and he has been reading a heart rate of 130-150s for the last couple of hours. I notified the patient's nurse and advised her to get an order for an EKG. After some time an order was not received so I got the EKG and informed MD that I had done so. The EKG read afib with RVR. Notified MD via amion and secure chat.

## 2022-10-14 DIAGNOSIS — Y92009 Unspecified place in unspecified non-institutional (private) residence as the place of occurrence of the external cause: Secondary | ICD-10-CM | POA: Diagnosis not present

## 2022-10-14 DIAGNOSIS — S72001A Fracture of unspecified part of neck of right femur, initial encounter for closed fracture: Secondary | ICD-10-CM | POA: Diagnosis not present

## 2022-10-14 DIAGNOSIS — I10 Essential (primary) hypertension: Secondary | ICD-10-CM | POA: Diagnosis not present

## 2022-10-14 DIAGNOSIS — W19XXXA Unspecified fall, initial encounter: Secondary | ICD-10-CM | POA: Diagnosis not present

## 2022-10-14 LAB — CBC
HCT: 28.2 % — ABNORMAL LOW (ref 39.0–52.0)
Hemoglobin: 9 g/dL — ABNORMAL LOW (ref 13.0–17.0)
MCH: 34.1 pg — ABNORMAL HIGH (ref 26.0–34.0)
MCHC: 31.9 g/dL (ref 30.0–36.0)
MCV: 106.8 fL — ABNORMAL HIGH (ref 80.0–100.0)
Platelets: 199 10*3/uL (ref 150–400)
RBC: 2.64 MIL/uL — ABNORMAL LOW (ref 4.22–5.81)
RDW: 14 % (ref 11.5–15.5)
WBC: 13.1 10*3/uL — ABNORMAL HIGH (ref 4.0–10.5)
nRBC: 0 % (ref 0.0–0.2)

## 2022-10-14 LAB — BASIC METABOLIC PANEL
Anion gap: 6 (ref 5–15)
BUN: 35 mg/dL — ABNORMAL HIGH (ref 8–23)
CO2: 22 mmol/L (ref 22–32)
Calcium: 8.6 mg/dL — ABNORMAL LOW (ref 8.9–10.3)
Chloride: 118 mmol/L — ABNORMAL HIGH (ref 98–111)
Creatinine, Ser: 1.66 mg/dL — ABNORMAL HIGH (ref 0.61–1.24)
GFR, Estimated: 42 mL/min — ABNORMAL LOW (ref >60)
Glucose, Bld: 147 mg/dL — ABNORMAL HIGH (ref 70–99)
Potassium: 3.7 mmol/L (ref 3.5–5.1)
Sodium: 146 mmol/L — ABNORMAL HIGH (ref 135–145)

## 2022-10-14 LAB — URINE CULTURE: Culture: >=100000 — AB

## 2022-10-14 MED ORDER — SODIUM CHLORIDE 0.45 % IV SOLN: INTRAVENOUS | Status: AC

## 2022-10-14 NOTE — Progress Notes (Signed)
PROGRESS NOTE    Patient: Ricardo Garrett                            PCP: [name], MD                    DOB: Jan 13, 1946            DOA: 10/07/2022 [id number]             DOS: 10/14/2022, 7:13 PM   LOS: 7 days   Date of Service: The patient was seen and examined on 10/14/2022  Subjective:   The patient was seen and examined, hemodynamically stable this morning  -Episodes of confusion and agitation alternating with lethargy persist  -LPN Brook at bedside -[name] and daughter [name] at bedside -Oral intake remains poor    Brief Narrative:   Ricardo Garrett is a 76 y.o. male with medical history significant of arthritis, coronary artery disease, CHF, atrial fibrillation, hypertension, alcohol use, presents to the ED with a chief complaint of mechanical fall.  Patient is unfortunately not able to provide any history to me.  Per chart review he reported that he was walking his dog when he had a mechanical fall.  He reported to ED that he fell because he was trying to move too quickly after the dog made him angry.  He landed on the side of his body.  His home health nurse arrived an hour later and called his daughter and they decided to bring him into the ED.  He was complaining of pain in his right hip and he was found to have a hip fracture.  Patient is on Eliquis and Plavix and Ortho requested that we hold those.  Ed : Temp 98, heart rate 58-61, RR 16-18, BP 106/69-147/79, satting at 100% No leukocytosis with WBC 9.0, hemoglobin 11.1, platelets 180 Chemistry is unremarkable aside from an elevated creatinine which is near to baseline with a previous creatinine being 1.81 and before that 1.71 Alcohol level 77 CT right hip shows acute mildly comminuted fracture of right greater trochanter encompassing the attachment sites of the gluteus medius and minimus.  Fracture plane extends down to upper margin of plate but does not appear to extend further along the  prosthetic Patient was started on CIWA protocol given thiamine, 1 L bolus, Percocet Unfortunately patient was given Ativan via CIWA and was not able to provide any history    Assessment & Plan:   Principal Problem:   Hip fracture ([place]) Active Problems:   Fall at home, initial encounter   Altered mental status   Alcohol use   Hypomagnesemia   Paroxysmal atrial fibrillation (HCC)   Essential hypertension   HLD (hyperlipidemia)   Assessment and Plan: 1)Hip fracture ([place]) - Right Hip Fracture CT right hip shows acute mildly comminuted fracture of the greater trochanter in ipsilateral side as previous hip surgery  -Orthopedic surgeon Dr Amedeo Kinsman recommends nonoperative management -P.o. oxycodone alternating with IV fentanyl as needed for pain control -Methocarbamol as ordered -Get PT OT after adequate pain control -Patient will most likely need SNF level rehab -Orthopedic surgeon recommends:-weightbearing as tolerated using a walker, with limited hip extension and abduction.  After he has had a chance to ambulate, we will plan to repeat x-rays.  this can be completed in clinic as outpatient  - Weight Bearing Status/Activity: Protected weightbearing as tolerated, using a walker at all times.  Limited  hip extension and hip abduction  2) acute metabolic encephalopathy----multifactorial -[name] [name] and patient's daughter [name] reports history of cognitive deficits/decline at baseline... -some h/o alcohol use..  Currently receiving benzos and opiates - CT head from 09/19/2022 and repeat CTA from 10/12/2022 without acute intracranial abnormalities -Will be more judicious with opiates and benzos at this time  3)Fall at home, initial encounter -Described as a mechanical fall to ED staff -Resulting hip fracture -not a surgical case -Recurrent falls---PTA pt lived at home and did very poorly, patient has significant limitations with mobility related ADLs- this patient needs to  continue to be monitored in the hospital until a SNF bed is obtained as she is not safe to go home with her current physcical limitations    4)Possible UTI--UA suspicious for possible UTI treat empirically with IV Rocephin pending culture data  5)FEN--poor oral intake, give nutritional supplements and IV fluids until oral intake for intake improves -Avoid excessive daytime sedation to allow for patient to be awake enough to have adequate oral intake  6)PAFib--continue digoxin and metoprolol for rate control -Continue Eliquis for stroke prophylaxis   7) alcohol use concerns- Blood alcohol level was 77 on admission -Please see #2 above   8)HLD (hyperlipidemia) -Continue statin  9)HTN-- -continue amlodipine 10 mg daily, losartan 25 mg daily, and metoprolol XL 12.5 mg twice daily -IV labetalol as needed elevated BP  10)Hypomagnesemia -- Replace and recheck  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 --Improving with hydration - renally adjust medications, avoid nephrotoxic agents / dehydration  / hypotension  12)social/ethics--- plan of care discussed with patient's daughters Danae Orleans,   patient's wife [name] at bedside they had multiple questions -Palliative care consult appreciated patient is a DNR/DNI   13)status post fall with musculoskeletal injury/pain--concerns about shoulder elbow and wrist pain-- -right wrist right shoulder and right elbow x-rays without acute findings -Pain management as above -PT OT eval appreciated, recommends SNF rehab   14)Depression/mood disorder--- stop Seroquel due to oversedation,, continue Celexa -Be judicious with benzos  15) hypernatremia and hyperchloremia--- due to dehydration, half-normal saline as ordered  ------------------------------------------------------------------------------------------------------------------------------------------------  DVT prophylaxis:  apixaban (ELIQUIS) tablet 2.5 mg Start: 10/08/22 1300 SCDs Start: 10/07/22  2054 apixaban (ELIQUIS) tablet 2.5 mg   Code Status:   Code Status: DNR  Family Communication: Daughter Magdalene River and Wife [name])  were updated at bedside The above findings and plan of care has been discussed with patient (and family)  in detail,  they expressed understanding and agreement of above. -Advance care planning has been discussed.   Disposition:,   From home pending SNF discharge (pending insurance approval)  Admission status:   Status is: Inpatient Remains inpatient appropriate because: Needing close evaluation, IV fluids, orthopedic evaluation, pain management   Procedures:   Antimicrobials:  Anti-infectives (From admission, onward)    Start     Dose/Rate Route Frequency Ordered Stop   10/13/22 0800  cefTRIAXone (ROCEPHIN) 1 g in sodium chloride 0.9 % 100 mL IVPB        1 g 200 mL/hr over 30 Minutes Intravenous Every 24 hours 10/13/22 0720        Medication:   acetaminophen  650 mg Oral QID   amLODipine  10 mg Oral Daily   apixaban  2.5 mg Oral BID   atorvastatin  80 mg Oral Daily   citalopram  20 mg Oral Daily   digoxin  125 mcg Oral Daily   folic acid  1 mg Oral Daily   losartan  25 mg Oral Daily   methocarbamol  500 mg Oral TID   metoprolol succinate  12.5 mg Oral BID   senna-docusate  2 tablet Oral QHS   tamsulosin  0.4 mg Oral Daily   thiamine  100 mg Oral Daily   Or   thiamine  100 mg Intravenous Daily    acetaminophen **OR** acetaminophen, ALPRAZolam, HYDROmorphone (DILAUDID) injection, labetalol, metoprolol tartrate, ondansetron **OR** ondansetron (ZOFRAN) IV, oxyCODONE   Objective:   Vitals:   10/14/22 1235 10/14/22 1649 10/14/22 1654 10/14/22 1746  BP: (!) 138/90 100/66  123/78  Pulse: 100 (!) 44 (!) 104 94  Resp: '18 20  20  '$ Temp: 97.8 F (36.6 C) (!) 97.5 F (36.4 C) 97.8 F (36.6 C) (!) 97.5 F (36.4 C)  TempSrc: Oral Oral Axillary Oral  SpO2: 97% 98% 93% 96%  Weight:      Height:        Intake/Output Summary (Last  24 hours) at 10/14/2022 1913 Last data filed at 10/14/2022 1549 Gross per 24 hour  Intake 2400.7 ml  Output 500 ml  Net 1900.7 ml   Filed Weights   10/07/22 1431 10/08/22 1030  Weight: 52.2 kg 56.7 kg    Examination:    Physical Exam  Gen:-  less sleepy, episodes of agitation from time to time in no acute distress  HEENT:- [place].AT, No sclera icterus Neck-Supple Neck,No JVD,.  Lungs-  CTAB , fair air movement bilaterally  CV- S1, S2 normal, RRR Abd-  +ve B.Sounds, Abd Soft, No tenderness,    Neuro-generalized weakness, limited exam due to altered mentation , no additional new focal deficits, no tremors Extremities- good pulses,  MSK-right hip limited range of motion with pain with range of motion and palpation -Right wrist swelling is improving, discomfort with range of motion  ------------------------------------------------------------------------------------------------------------------------------------------   LABs:     Latest Ref Rng & Units 10/14/2022    3:52 AM 10/11/2022    3:22 AM 10/08/2022    4:37 AM  CBC  WBC 4.0 - 10.5 K/uL 13.1  8.4  5.6   Hemoglobin 13.0 - 17.0 g/dL 9.0  8.9  11.2   Hematocrit 39.0 - 52.0 % 28.2  26.9  33.4   Platelets 150 - 400 K/uL 199  158  166       Latest Ref Rng & Units 10/14/2022    3:52 AM 10/11/2022    3:22 AM 10/10/2022    4:45 AM  CMP  Glucose 70 - 99 mg/dL 147  158  113   BUN 8 - 23 mg/dL 35  29  28   Creatinine 0.61 - 1.24 mg/dL 1.66  1.48  1.61   Sodium 135 - 145 mmol/L 146  141  138   Potassium 3.5 - 5.1 mmol/L 3.7  4.4  4.2   Chloride 98 - 111 mmol/L 118  115  113   CO2 22 - 32 mmol/L '22  21  22   '$ Calcium 8.9 - 10.3 mg/dL 8.6  8.6  8.0    Radiology Reports No results found.  SIGNED: Roxan Hockey, MD,  [facility],  Pager (please use [email or web address] to page/text) Please use Epic Secure Chat for non-urgent communication (7AM-7PM)  If 7PM-7AM, please contact night-coverage [email or web address], 10/14/2022, 7:13  PM

## 2022-10-14 NOTE — TOC Progression Note (Addendum)
Transition of Care [facility]) - Progression Note    Patient Details  Name: Ricardo Garrett MRN: [id number] Date of Birth: 1946/05/18  [facility] CM/SW Contact  Boneta Lucks, RN Phone Number: 10/14/2022, 1:40 PM  Clinical Narrative:   Insurance authorization approved 12/14 - 12/20, next review 72/82, cert # [id number]   Avoidable day added. [facility] does not have a bed today. Having issues with some rooms, Maintenance will have them ready for tomorrow. MD/RN updated.   Addendum :  Bed still not ready due to electrical issues and no heat in several room. Debbie will update TOC tomorrow, Avoidable day added, Patient is medically ready.    Expected Discharge Plan: [place] Barriers to Discharge: No SNF bed  Expected Discharge Plan and Services Expected Discharge Plan: [place] arrangements for the past 2 months: Single Family Home                      Readmission Risk Interventions    10/11/2022    2:59 PM  Readmission Risk Prevention Plan  Transportation Screening Complete  PCP or Specialist Appt within 3-5 Days Not Complete  HRI or [place] Complete  Social Work Consult for [place] Planning/Counseling Complete  Palliative Care Screening Not Applicable  Medication Review [employer]) Complete

## 2022-10-14 NOTE — Progress Notes (Signed)
   10/14/22 0759  Assess: MEWS Score  Temp 97.7 F (36.5 C)  BP (!) 143/92  MAP (mmHg) 108  Pulse Rate (!) 117  Resp 18  SpO2 100 %  O2 Device Nasal Cannula  O2 Flow Rate (L/min) 4 L/min  Assess: MEWS Score  MEWS Temp 0  MEWS Systolic 0  MEWS Pulse 2  MEWS RR 0  MEWS LOC 0  MEWS Score 2  MEWS Score Color Yellow  Assess: if the MEWS score is Yellow or Red  Were vital signs taken at a resting state? Yes  Focused Assessment No change from prior assessment  Does the patient meet 2 or more of the SIRS criteria? No  Does the patient have a confirmed or suspected source of infection? No  MEWS guidelines implemented *See Row Information* Yes  Treat  Pain Scale Faces  Faces Pain Scale 2  Take Vital Signs  Increase Vital Sign Frequency  Yellow: Q 2hr X 2 then Q 4hr X 2, if remains yellow, continue Q 4hrs  Escalate  MEWS: Escalate Yellow: discuss with charge nurse/RN and consider discussing with provider and RRT  Notify: Charge Nurse/RN  Name of Charge Nurse/RN Notified Brandi RN  Date Charge Nurse/RN Notified 10/14/22  Time Charge Nurse/RN Notified 0800  Provider Notification  Provider Name/Title MD Courage  Date Provider Notified 10/14/22  Time Provider Notified 0800  Method of Notification Rounds  Notification Reason Other (Comment) (yellow MEWS)  Provider response No new orders  Assess: SIRS CRITERIA  SIRS Temperature  0  SIRS Pulse 1  SIRS Respirations  0  SIRS WBC 1  SIRS Score Sum  2

## 2022-10-14 NOTE — Progress Notes (Addendum)
Patient has slept very little during the night. Patient has continuously taken off his oxygen, his hospital gown, and the telemetry monitor leads. Patient is constantly yelling out and throwing the covers off of himself in bed. Patient continues to be a yellow mews at this time due to his heart rate and respiratory rate.  Patient was give 3 prn medications during the shift.See MAR.

## 2022-10-14 NOTE — Progress Notes (Signed)
Physical Therapy Treatment Patient Details Name: Ricardo Garrett MRN: [id number] DOB: May 31, 1946 Today's Date: 10/14/2022   History of Present Illness Ricardo Garrett is a 76 y.o. male with medical history significant of arthritis, coronary artery disease, CHF, atrial fibrillation, hypertension, alcohol use, presents to the ED with a chief complaint of mechanical fall.  Patient is unfortunately not able to provide any history to me.  Per chart review he reported that he was walking his dog when he had a mechanical fall.  He reports that he fell because he was trying to move too quickly after the dog made him angry.  He landed on the side of his body.  His home health nurse arrived an hour later and called his daughter and they decided to bring him into the ED.  He was complaining of pain in his right hip and he was found to have a hip fracture.  Patient is on Eliquis and Plavix and Ortho requested that we hold those.    PT Comments    Patient presents alert and cooperative and required frequent verbal/tactile cueing for redirection to task during treatment.  Patient demonstrates slow labored movement for sitting up at bedside requiring assistance to move RLE due to pain, required verbal/tactile cueing for proper hand placement on RW during sit to stands and transferring to chair, limited to a few steps at bedside before having to sit due to fatigue and poor standing balance.  Patient had a bowel movement during therapy and tolerated staying up in chair after therapy - nursing staff aware.  Patient will benefit from continued skilled physical therapy in hospital and recommended venue below to increase strength, balance, endurance for safe ADLs and gait.     Recommendations for follow up therapy are one component of a multi-disciplinary discharge planning process, led by the attending physician.  Recommendations may be updated based on patient status, additional functional criteria and insurance  authorization.  Follow Up Recommendations  Skilled nursing-short term rehab (<3 hours/day) Can patient physically be transported by private vehicle: No   Assistance Recommended at Discharge Intermittent Supervision/Assistance  Patient can return home with the following A lot of help with bathing/dressing/bathroom;A lot of help with walking and/or transfers;Help with stairs or ramp for entrance;Assistance with cooking/housework   Equipment Recommendations  Rolling walker (2 wheels)    Recommendations for Other Services       Precautions / Restrictions Precautions Precautions: Fall Restrictions Weight Bearing Restrictions: Yes RLE Weight Bearing: Weight bearing as tolerated Other Position/Activity Restrictions: limited right hip abduction and extension     Mobility  Bed Mobility Overal bed mobility: Needs Assistance Bed Mobility: Supine to Sit     Supine to sit: Mod assist     General bed mobility comments: increased time, labored movement    Transfers Overall transfer level: Needs assistance Equipment used: Rolling walker (2 wheels) Transfers: Sit to/from Stand, Bed to chair/wheelchair/BSC Sit to Stand: Mod assist   Step pivot transfers: Mod assist       General transfer comment: unsteady labored movement requring verbal/tactile cueing for proper hand placement on RW    Ambulation/Gait Ambulation/Gait assistance: Mod assist Gait Distance (Feet): 4 Feet Assistive device: Rolling walker (2 wheels) Gait Pattern/deviations: Decreased step length - right, Decreased step length - left, Decreased stride length Gait velocity: slow     General Gait Details: limited to a few steps forward/backward before having to sit due to poor standing balance, RLE weakness   Stairs  Wheelchair Mobility    Modified Rankin (Stroke Patients Only)       Balance Overall balance assessment: Needs assistance Sitting-balance support: Feet supported, No upper  extremity supported Sitting balance-Leahy Scale: Fair Sitting balance - Comments: seated at EOB   Standing balance support: During functional activity, Reliant on assistive device for balance, Bilateral upper extremity supported Standing balance-Leahy Scale: Poor Standing balance comment: fair/poor using RW                            Cognition Arousal/Alertness: Awake/alert Behavior During Therapy: Restless, WFL for tasks assessed/performed Overall Cognitive Status: No family/caregiver present to determine baseline cognitive functioning                                          Exercises      General Comments        Pertinent Vitals/Pain Pain Assessment Pain Assessment: Faces Faces Pain Scale: Hurts little more Pain Location: RUE, RLE with movement Pain Descriptors / Indicators: Grimacing, Guarding, Sore Pain Intervention(s): Limited activity within patient's tolerance, Monitored during session, Repositioned    Home Living                          Prior Function            PT Goals (current goals can now be found in the care plan section) Acute Rehab PT Goals Patient Stated Goal: return home PT Goal Formulation: With patient Time For Goal Achievement: 10/25/22 Potential to Achieve Goals: Good Progress towards PT goals: Progressing toward goals    Frequency    Min 3X/week      PT Plan Current plan remains appropriate    Co-evaluation              AM-PAC PT "6 Clicks" Mobility   Outcome Measure  Help needed turning from your back to your side while in a flat bed without using bedrails?: A Little Help needed moving from lying on your back to sitting on the side of a flat bed without using bedrails?: A Lot Help needed moving to and from a bed to a chair (including a wheelchair)?: A Lot Help needed standing up from a chair using your arms (e.g., wheelchair or bedside chair)?: A Lot Help needed to walk in hospital  room?: A Lot Help needed climbing 3-5 steps with a railing? : Total 6 Click Score: 12    End of Session   Activity Tolerance: Patient tolerated treatment well;Patient limited by fatigue Patient left: in chair;with call bell/phone within reach;with chair alarm set Nurse Communication: Mobility status PT Visit Diagnosis: Unsteadiness on feet (R26.81);Other abnormalities of gait and mobility (R26.89);Muscle weakness (generalized) (M62.81)     Time: 7793-9030 PT Time Calculation (min) (ACUTE ONLY): 30 min  Charges:  $Therapeutic Activity: 23-37 mins                     3:47 PM, 10/14/22 Lonell Grandchild, MPT Physical Therapist with [facility] 336 [phone] office [phone] mobile phone

## 2022-10-14 NOTE — Progress Notes (Signed)
Patient continues with increased confusion only alert to self. Patient continues removing oxygen, pulling at telemetry monitor and gown. Patient having episodes of being irritated, yelling at staff and attempting to climb out of bed and chair. Mittens applied during shift. MD Courage made aware. Patient given PRN pain and anxiety medication, see MAR. PT transferred patient from bed to chair.

## 2022-10-14 NOTE — Care Management Important Message (Signed)
Important Message  Patient Details  Name: Ricardo Garrett MRN: [id number] Date of Birth: October 13, 1946   Medicare Important Message Given:  Yes     Tommy Medal 10/14/2022, 11:27 AM

## 2022-10-14 NOTE — Progress Notes (Signed)
   10/14/22 1746  Assess: MEWS Score  Temp (!) 97.5 F (36.4 C)  BP 123/78  MAP (mmHg) 93  Pulse Rate 94  Resp 20  SpO2 96 %  O2 Device Nasal Cannula  O2 Flow Rate (L/min) 2 L/min  Assess: MEWS Score  MEWS Temp 0  MEWS Systolic 0  MEWS Pulse 0  MEWS RR 0  MEWS LOC 0  MEWS Score 0  MEWS Score Color Green  Assess: if the MEWS score is Yellow or Red  Were vital signs taken at a resting state? Yes  Focused Assessment Change from prior assessment (see assessment flowsheet)  Does the patient meet 2 or more of the SIRS criteria? No  Does the patient have a confirmed or suspected source of infection? No  Treat  Pain Scale Faces  Pain Score 7  Pain Location Hip  Pain Orientation Right  Pain Descriptors / Indicators Grimacing;Moaning  Pain Intervention(s) Medication (See eMAR)  Assess: SIRS CRITERIA  SIRS Temperature  0  SIRS Pulse 1  SIRS Respirations  0  SIRS WBC 0  SIRS Score Sum  1

## 2022-10-14 NOTE — Progress Notes (Signed)
   10/14/22 1649  Assess: MEWS Score  Temp (!) 97.5 F (36.4 C)  BP 100/66  MAP (mmHg) 78  Pulse Rate (!) 44 (Heart rate rechecked 104)  Resp 20  SpO2 98 %  O2 Device Nasal Cannula  O2 Flow Rate (L/min) 2 L/min  Assess: MEWS Score  MEWS Temp 0  MEWS Systolic 1  MEWS Pulse 1  MEWS RR 0  MEWS LOC 0  MEWS Score 2  MEWS Score Color Yellow  Assess: if the MEWS score is Yellow or Red  Were vital signs taken at a resting state? Yes  Focused Assessment Change from prior assessment (see assessment flowsheet)  Does the patient meet 2 or more of the SIRS criteria? No  Does the patient have a confirmed or suspected source of infection? No  Treat  Pain Scale Faces  Faces Pain Scale 2  Take Vital Signs  Increase Vital Sign Frequency  Yellow: Q 2hr X 2 then Q 4hr X 2, if remains yellow, continue Q 4hrs  Escalate  MEWS: Escalate Yellow: discuss with charge nurse/RN and consider discussing with provider and RRT  Notify: Charge Nurse/RN  Name of Charge Nurse/RN Notified Brandi RN  Date Charge Nurse/RN Notified 10/14/22  Time Charge Nurse/RN Notified 1650  Provider Notification  Provider Name/Title Md Courage  Date Provider Notified 10/14/22  Method of Notification Page (Secure chat)  Notification Reason Other (Comment) (yellow MEWs)  Provider response No new orders  Assess: SIRS CRITERIA  SIRS Temperature  0  SIRS Pulse 0  SIRS Respirations  0  SIRS WBC 0  SIRS Score Sum  0

## 2022-10-15 DIAGNOSIS — S72001A Fracture of unspecified part of neck of right femur, initial encounter for closed fracture: Secondary | ICD-10-CM | POA: Diagnosis not present

## 2022-10-15 DIAGNOSIS — I10 Essential (primary) hypertension: Secondary | ICD-10-CM | POA: Diagnosis not present

## 2022-10-15 LAB — BASIC METABOLIC PANEL
Anion gap: 4 — ABNORMAL LOW (ref 5–15)
BUN: 36 mg/dL — ABNORMAL HIGH (ref 8–23)
CO2: 21 mmol/L — ABNORMAL LOW (ref 22–32)
Calcium: 8.2 mg/dL — ABNORMAL LOW (ref 8.9–10.3)
Chloride: 117 mmol/L — ABNORMAL HIGH (ref 98–111)
Creatinine, Ser: 1.56 mg/dL — ABNORMAL HIGH (ref 0.61–1.24)
GFR, Estimated: 45 mL/min — ABNORMAL LOW (ref >60)
Glucose, Bld: 106 mg/dL — ABNORMAL HIGH (ref 70–99)
Potassium: 3.7 mmol/L (ref 3.5–5.1)
Sodium: 142 mmol/L (ref 135–145)

## 2022-10-15 MED ORDER — RENA-VITE PO TABS
1.0000 | ORAL_TABLET | Freq: Every day | ORAL | Status: DC
  Administered 2022-10-15 – 2022-10-16 (×2): 1 via ORAL
  Filled 2022-10-15 (×2): qty 1

## 2022-10-15 MED ORDER — ENSURE ENLIVE PO LIQD
237.0000 mL | Freq: Two times a day (BID) | ORAL | Status: DC
  Administered 2022-10-15 – 2022-10-16 (×3): 237 mL via ORAL

## 2022-10-15 NOTE — Progress Notes (Signed)
   10/15/22 1305  Vitals  Temp 97.6 F (36.4 C)  Temp Source Oral  BP (!) 98/59  MAP (mmHg) 72  BP Location Left Arm  BP Method Automatic  Patient Position (if appropriate) Lying  Pulse Rate 70  Pulse Rate Source Monitor  Resp 16  MEWS COLOR  MEWS Score Color Green  Oxygen Therapy  SpO2 100 %  O2 Device Nasal Cannula  MEWS Score  MEWS Temp 0  MEWS Systolic 1  MEWS Pulse 0  MEWS RR 0  MEWS LOC 0  MEWS Score 1   MD Emokpae notified

## 2022-10-15 NOTE — TOC Progression Note (Signed)
Transition of Care [facility]) - Progression Note    Patient Details  Name: Ricardo Garrett MRN: [id number] Date of Birth: 12-30-1945  [facility]) CM/SW Contact  Boneta Lucks, RN Phone Number: 10/15/2022, 12:46 PM  Clinical Narrative:   Daughter Ricardo Garrett at the bedside, TOC called to update her with delay in transporting to [facility].    Expected Discharge Plan: [place] Barriers to Discharge: No SNF bed  Expected Discharge Plan and Services Expected Discharge Plan: [place] arrangements for the past 2 months: Single Family Home                   Readmission Risk Interventions    10/11/2022    2:59 PM  Readmission Risk Prevention Plan  Transportation Screening Complete  PCP or Specialist Appt within 3-5 Days Not Complete  HRI or [place] Complete  Social Work Consult for [place] Planning/Counseling Complete  Palliative Care Screening Not Applicable  Medication Review Press photographer) Complete

## 2022-10-15 NOTE — Progress Notes (Signed)
Initial Nutrition Assessment  DOCUMENTATION CODES:   Not applicable  INTERVENTION:  - Continue current diet order.   - Add Ensure Enlive po BID, each supplement provides 350 kcal and 20 grams of protein.  -Add Renal MVI q day.   NUTRITION DIAGNOSIS:   Inadequate oral intake related to poor appetite, lethargy/confusion as evidenced by meal completion < 25%.  GOAL:   Patient will meet greater than or equal to 90% of their needs  MONITOR:   PO intake  REASON FOR ASSESSMENT:   Malnutrition Screening Tool    ASSESSMENT:   76 y.o. male admits related to fall. PMH includes: arthritis, CAD, CHF, COPD, HTN, persistent afib. Pt is currently receiving medical management for hip fracture.  Meds include: lipitor, folic acid, cozaar, senokot, Thiamine. Labs reviewed.   RD is working remotely. Pt is Ox1. Pt ate 30% of his breakfast this am. Per record, pt has been eating 0-25% of his meals since admission. Pt is not meeting his needs. RD will add Ensure BID for now and continue to monitor PO intakes. No significant wt loss per record.   NUTRITION - FOCUSED PHYSICAL EXAM:  Unable to assess, attempt at f/u.   Diet Order:   Diet Order             Diet Heart Room service appropriate? Yes; Fluid consistency: Thin  Diet effective now                   EDUCATION NEEDS:   Not appropriate for education at this time  Skin:  Skin Assessment: Reviewed RN Assessment  Last BM:  10/14/22  Height:   Ht Readings from Last 1 Encounters:  10/07/22 '5\' 8"'$  (1.727 m)    Weight:   Wt Readings from Last 1 Encounters:  10/08/22 56.7 kg    Ideal Body Weight:     BMI:  Body mass index is 19.01 kg/m.  Estimated Nutritional Needs:   Kcal:  1415-1700 kcals  Protein:  70-85 gm  Fluid:  >/= 1.4 L  Thalia Bloodgood, RD, LDN, CNSC.

## 2022-10-15 NOTE — Progress Notes (Signed)
PROGRESS NOTE    Patient: Ricardo Garrett                            PCP: [name], MD                    DOB: May 27, 1946            DOA: 10/07/2022 [id number]             DOS: 10/15/2022, 7:52 PM   LOS: 8 days   Date of Service: The patient was seen and examined on 10/15/2022  Subjective:   The patient was seen and examined, hemodynamically stable this morning  -Intake improving -More awake  10/15/22 -Remains medically stable for discharge to SNF facility when a bed can be found and insurance authorization can be obtained  Brief Narrative:   Ricardo Garrett is a 76 y.o. male with medical history significant of arthritis, coronary artery disease, CHF, atrial fibrillation, hypertension, alcohol use, presents to the ED with a chief complaint of mechanical fall.  Patient is unfortunately not able to provide any history to me.  Per chart review he reported that he was walking his dog when he had a mechanical fall.  He reported to ED that he fell because he was trying to move too quickly after the dog made him angry.  He landed on the side of his body.  His home health nurse arrived an hour later and called his daughter and they decided to bring him into the ED.  He was complaining of pain in his right hip and he was found to have a hip fracture.  Patient is on Eliquis and Plavix and Ortho requested that we hold those.  Ed : Temp 98, heart rate 58-61, RR 16-18, BP 106/69-147/79, satting at 100% No leukocytosis with WBC 9.0, hemoglobin 11.1, platelets 180 Chemistry is unremarkable aside from an elevated creatinine which is near to baseline with a previous creatinine being 1.81 and before that 1.71 Alcohol level 77 CT right hip shows acute mildly comminuted fracture of right greater trochanter encompassing the attachment sites of the gluteus medius and minimus.  Fracture plane extends down to upper margin of plate but does not appear to extend further along the prosthetic Patient  was started on CIWA protocol given thiamine, 1 L bolus, Percocet Unfortunately patient was given Ativan via CIWA and was not able to provide any history    Assessment & Plan:   Principal Problem:   Hip fracture ([place]) Active Problems:   Fall at home, initial encounter   Altered mental status   Alcohol use   Hypomagnesemia   Paroxysmal atrial fibrillation (HCC)   Essential hypertension   HLD (hyperlipidemia)   Assessment and Plan: 1)Hip fracture ([place]) - Right Hip Fracture CT right hip shows acute mildly comminuted fracture of the greater trochanter in ipsilateral side as previous hip surgery  -Orthopedic surgeon Dr Amedeo Kinsman recommends nonoperative management -P.o. oxycodone alternating with IV fentanyl as needed for pain control -Methocarbamol as ordered -Get PT OT after adequate pain control -Patient will most likely need SNF level rehab -Orthopedic surgeon recommends:-weightbearing as tolerated using a walker, with limited hip extension and abduction.  After he has had a chance to ambulate, we will plan to repeat x-rays.  this can be completed in clinic as outpatient  - Weight Bearing Status/Activity: Protected weightbearing as tolerated, using a walker at all times.  Limited hip  extension and hip abduction - 10/15/22 -Remains medically stable for discharge to SNF facility when a bed can be found and insurance authorization can be obtained  2) acute metabolic encephalopathy----multifactorial -[name] Ricardo Garrett and patient's daughter Ricardo Garrett reports history of cognitive deficits/decline at baseline... -some h/o alcohol use..  Currently receiving benzos and opiates - CT head from 09/19/2022 and repeat CTA from 10/12/2022 without acute intracranial abnormalities -Will be more judicious with opiates and benzos at this time  3)Fall at home, initial encounter -Described as a mechanical fall to ED staff -Resulting hip fracture -not a surgical case -Recurrent falls---PTA pt  lived at home and did very poorly, patient has significant limitations with mobility related ADLs- this patient needs to continue to be monitored in the hospital until a SNF bed is obtained as she is not safe to go home with her current physcical limitations    4)Possible UTI--UA suspicious for possible UTI treat empirically with IV Rocephin pending culture data  5)FEN--poor oral intake, give nutritional supplements and IV fluids until oral intake for intake improves -Avoid excessive daytime sedation to allow for patient to be awake enough to have adequate oral intake  6)PAFib--continue digoxin and metoprolol for rate control -Continue Eliquis for stroke prophylaxis   7) alcohol use concerns- Blood alcohol level was 77 on admission -Please see #2 above   8)HLD (hyperlipidemia) -Continue statin  9)HTN-- -continue amlodipine 10 mg daily, losartan 25 mg daily, and metoprolol XL 12.5 mg twice daily -IV labetalol as needed elevated BP  10)Hypomagnesemia -- Replace and recheck  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 --Improved with hydration - renally adjust medications, avoid nephrotoxic agents / dehydration  / hypotension  12)social/ethics--- plan of care discussed with patient's daughters Ricardo Garrett,   patient's wife Ricardo Garrett at bedside they had multiple questions -Palliative care consult appreciated patient is a DNR/DNI   13)status post fall with musculoskeletal injury/pain--concerns about shoulder elbow and wrist pain-- -right wrist right shoulder and right elbow x-rays without acute findings -Pain management as above -PT OT eval appreciated, recommends SNF rehab  10/15/22 -Remains medically stable for discharge to SNF facility when a bed can be found and insurance authorization can be obtained   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--- stop Seroquel due to oversedation,, continue Celexa -Be judicious with benzos  15) hypernatremia and hyperchloremia--- due to dehydration, received IV fluids  ------------------------------------------------------------------------------------------------------------------------------------------------  DVT prophylaxis:  apixaban (ELIQUIS) tablet 2.5 mg Start: 10/08/22 1300 SCDs Start: 10/07/22 2054 apixaban (ELIQUIS) tablet 2.5 mg   Code Status:   Code Status: DNR  Family Communication: Daughter Magdalene River and Wife Ricardo Garrett)  were updated at bedside The above findings and plan of care has been discussed with patient (and family)  in detail,  they expressed understanding and agreement of above. -Advance care planning has been discussed.   Disposition:,   From home pending SNF discharge (pending insurance approval) -Barriers:  10/15/22 -Remains medically stable for discharge to SNF facility when a bed can be found and insurance authorization can be obtained  Admission status:   Status is: Inpatient Remains inpatient appropriate because: Needing close evaluation, IV fluids, orthopedic evaluation, pain management   Procedures:   Antimicrobials:  Anti-infectives (From admission, onward)    Start     Dose/Rate Route Frequency Ordered Stop   10/13/22 0800  cefTRIAXone (ROCEPHIN) 1 g in sodium chloride 0.9 % 100 mL IVPB        1 g 200 mL/hr over 30 Minutes Intravenous Every 24 hours 10/13/22 0720  Medication:   acetaminophen  650 mg Oral QID   amLODipine  10 mg Oral Daily   apixaban  2.5 mg Oral BID   atorvastatin  80 mg Oral Daily   citalopram  20 mg Oral Daily   digoxin  125 mcg Oral Daily   feeding supplement  237 mL Oral BID BM   folic acid  1 mg Oral Daily   losartan  25 mg Oral Daily   methocarbamol  500 mg Oral TID   metoprolol succinate  12.5 mg Oral BID   multivitamin  1 tablet Oral QHS   senna-docusate  2 tablet Oral QHS   tamsulosin  0.4 mg Oral Daily   thiamine  100 mg Oral Daily   Or   thiamine  100 mg Intravenous Daily    acetaminophen **OR** acetaminophen, ALPRAZolam, HYDROmorphone (DILAUDID)  injection, labetalol, metoprolol tartrate, ondansetron **OR** ondansetron (ZOFRAN) IV, oxyCODONE   Objective:   Vitals:   10/14/22 2359 10/15/22 0406 10/15/22 0819 10/15/22 1305  BP: 102/69 132/79 (!) 142/93 (!) 98/59  Pulse: (!) 109 (!) 107 (!) 108 70  Resp: '16 16  16  '$ Temp: (!) 97.4 F (36.3 C) 97.6 F (36.4 C)  97.6 F (36.4 C)  TempSrc: Oral Oral  Oral  SpO2:  95%  100%  Weight:      Height:        Intake/Output Summary (Last 24 hours) at 10/15/2022 1952 Last data filed at 10/15/2022 1700 Gross per 24 hour  Intake 600 ml  Output --  Net 600 ml   Filed Weights   10/07/22 1431 10/08/22 1030  Weight: 52.2 kg 56.7 kg    Examination:    Physical Exam  Gen:-Awake,, episodes of agitation from time to time in no acute distress  HEENT:- [place].AT, No sclera icterus Neck-Supple Neck,No JVD,.  Lungs-  CTAB , fair air movement bilaterally  CV- S1, S2 normal, RRR Abd-  +ve B.Sounds, Abd Soft, No tenderness,    Neuro-generalized weakness, limited exam due to altered mentation , no additional new focal deficits, no tremors Extremities- good pulses,  MSK-right hip limited range of motion with pain with range of motion and palpation -Right wrist swelling is improving, discomfort with range of motion  ------------------------------------------------------------------------------------------------------------------------------------------   LABs:     Latest Ref Rng & Units 10/14/2022    3:52 AM 10/11/2022    3:22 AM 10/08/2022    4:37 AM  CBC  WBC 4.0 - 10.5 K/uL 13.1  8.4  5.6   Hemoglobin 13.0 - 17.0 g/dL 9.0  8.9  11.2   Hematocrit 39.0 - 52.0 % 28.2  26.9  33.4   Platelets 150 - 400 K/uL 199  158  166       Latest Ref Rng & Units 10/15/2022    7:10 AM 10/14/2022    3:52 AM 10/11/2022    3:22 AM  CMP  Glucose 70 - 99 mg/dL 106  147  158   BUN 8 - 23 mg/dL 36  35  29   Creatinine 0.61 - 1.24 mg/dL 1.56  1.66  1.48   Sodium 135 - 145 mmol/L 142  146  141    Potassium 3.5 - 5.1 mmol/L 3.7  3.7  4.4   Chloride 98 - 111 mmol/L 117  118  115   CO2 22 - 32 mmol/L '21  22  21   '$ Calcium 8.9 - 10.3 mg/dL 8.2  8.6  8.6    Radiology Reports No results found.  SIGNED: Roxan Hockey, MD,  [facility],  Pager (please use [email or web address] to page/text) Please use Epic Secure Chat for non-urgent communication (7AM-7PM)  If 7PM-7AM, please contact night-coverage [email or web address], 10/15/2022, 7:52 PM

## 2022-10-16 DIAGNOSIS — Y92009 Unspecified place in unspecified non-institutional (private) residence as the place of occurrence of the external cause: Secondary | ICD-10-CM | POA: Diagnosis not present

## 2022-10-16 DIAGNOSIS — S72001A Fracture of unspecified part of neck of right femur, initial encounter for closed fracture: Secondary | ICD-10-CM | POA: Diagnosis not present

## 2022-10-16 DIAGNOSIS — I10 Essential (primary) hypertension: Secondary | ICD-10-CM | POA: Diagnosis not present

## 2022-10-16 DIAGNOSIS — W19XXXA Unspecified fall, initial encounter: Secondary | ICD-10-CM | POA: Diagnosis not present

## 2022-10-16 MED ORDER — HALOPERIDOL LACTATE 5 MG/ML IJ SOLN
2.0000 mg | Freq: Once | INTRAMUSCULAR | Status: AC
  Administered 2022-10-16: 2 mg via INTRAMUSCULAR
  Filled 2022-10-16: qty 1

## 2022-10-16 NOTE — TOC Progression Note (Signed)
Transition of Care [facility]) - Progression Note    Patient Details  Name: Ricardo Garrett MRN: [id number] Date of Birth: 14-Mar-1946  Transition of Care [facility]) CM/SW Contact  Salome Arnt, [place] Phone Number: 10/16/2022, 10:22 AM  Clinical Narrative: Male SNF bed not available at [facility] today. TOC will follow up in AM and consider new placement if SNF bed continues to be unavailable.       Expected Discharge Plan: [place] Barriers to Discharge: No SNF bed  Expected Discharge Plan and Services Expected Discharge Plan: [place] arrangements for the past 2 months: Single Family Home                                       Social Determinants of Health (SDOH) Interventions    Readmission Risk Interventions    10/11/2022    2:59 PM  Readmission Risk Prevention Plan  Transportation Screening Complete  PCP or Specialist Appt within 3-5 Days Not Complete  HRI or [place] Complete  Social Work Consult for [place] Planning/Counseling Complete  Palliative Care Screening Not Applicable  Medication Review [employer]) Complete

## 2022-10-16 NOTE — Progress Notes (Signed)
Nurse at  bedside, patient asleep. Bed in lowest position, fall mat in place, bed alarm on and call bell within reach. Vital signs WNL.

## 2022-10-16 NOTE — Progress Notes (Signed)
PROGRESS NOTE    Patient: Ricardo Garrett                            PCP: [name], MD                    DOB: 01-13-1946            DOA: 10/07/2022 [id number]             DOS: 10/16/2022, 6:18 PM   LOS: 9 days   Date of Service: The patient was seen and examined on 10/16/2022  Subjective:   The patient was seen and examined, hemodynamically stable this morning  -Intake improving -More awake, more cooperative less agitated   10/16/22 -Remains medically stable for discharge to SNF facility when a bed can be found and insurance authorization can be obtained  Brief Narrative:   Ricardo Garrett is a 76 y.o. male with medical history significant of arthritis, coronary artery disease, CHF, atrial fibrillation, hypertension, alcohol use, presents to the ED with a chief complaint of mechanical fall.  Patient is unfortunately not able to provide any history to me.  Per chart review he reported that he was walking his dog when he had a mechanical fall.  He reported to ED that he fell because he was trying to move too quickly after the dog made him angry.  He landed on the side of his body.  His home health nurse arrived an hour later and called his daughter and they decided to bring him into the ED.  He was complaining of pain in his right hip and he was found to have a hip fracture.  Patient is on Eliquis and Plavix and Ortho requested that we hold those.  Ed : Temp 98, heart rate 58-61, RR 16-18, BP 106/69-147/79, satting at 100% No leukocytosis with WBC 9.0, hemoglobin 11.1, platelets 180 Chemistry is unremarkable aside from an elevated creatinine which is near to baseline with a previous creatinine being 1.81 and before that 1.71 Alcohol level 77 CT right hip shows acute mildly comminuted fracture of right greater trochanter encompassing the attachment sites of the gluteus medius and minimus.  Fracture plane extends down to upper margin of plate but does not appear to extend further  along the prosthetic Patient was started on CIWA protocol given thiamine, 1 L bolus, Percocet Unfortunately patient was given Ativan via CIWA and was not able to provide any history    Assessment & Plan:   Principal Problem:   Hip fracture ([place]) Active Problems:   Fall at home, initial encounter   Altered mental status   Alcohol use   Hypomagnesemia   Paroxysmal atrial fibrillation (HCC)   Essential hypertension   HLD (hyperlipidemia)   Assessment and Plan: 1)Hip fracture ([place]) - Right Hip Fracture CT right hip shows acute mildly comminuted fracture of the greater trochanter in ipsilateral side as previous hip surgery  -Orthopedic surgeon Dr Amedeo Kinsman recommends nonoperative management -P.o. oxycodone alternating with IV fentanyl as needed for pain control -Methocarbamol as ordered -Get PT OT after adequate pain control -Patient will most likely need SNF level rehab -Orthopedic surgeon recommends:-weightbearing as tolerated using a walker, with limited hip extension and abduction.  After he has had a chance to ambulate, we will plan to repeat x-rays.  this can be completed in clinic as outpatient  - Weight Bearing Status/Activity: Protected weightbearing as tolerated, using a walker at  all times.  Limited hip extension and hip abduction - 10/16/22 -Remains medically stable for discharge to SNF facility when a bed can be found and insurance authorization can be obtained  2) acute metabolic encephalopathy superimposed on underlying dementia----multifactorial etiology for encephalopathy -[name] [name] and patient's daughter [name] reports history of cognitive deficits/decline at baseline... -some h/o alcohol use..  Currently receiving benzos and opiates - CT head from 09/19/2022 and repeat CTA from 10/12/2022 without acute intracranial abnormalities -Will be more judicious with opiates and benzos at this time -Dementia with cognitive memory deficits and occasional  behavioral problems persist  3)Fall at home, initial encounter -Described as a mechanical fall to ED staff -Resulting hip fracture -not a surgical case -Recurrent falls---PTA pt lived at home and did very poorly, patient has significant limitations with mobility related ADLs- this patient needs to continue to be monitored in the hospital until a SNF bed is obtained as she is not safe to go home with her current physcical limitations    4)Possible UTI--UA suspicious for possible UTI treat empirically with IV Rocephin pending culture data  5)FEN--poor oral intake, give nutritional supplements and IV fluids until oral intake for intake improves -Avoid excessive daytime sedation to allow for patient to be awake enough to have adequate oral intake  6)PAFib--continue digoxin and metoprolol for rate control -Continue Eliquis for stroke prophylaxis   7) alcohol use concerns- Blood alcohol level was 77 on admission -Please see #2 above   8)HLD (hyperlipidemia) -Continue statin  9)HTN-- -continue amlodipine 10 mg daily, losartan 25 mg daily, and metoprolol XL 12.5 mg twice daily -IV labetalol as needed elevated BP  10)Hypomagnesemia -- Replace and recheck  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 --Improved with hydration - renally adjust medications, avoid nephrotoxic agents / dehydration  / hypotension  12)social/ethics--- plan of care discussed with patient's daughters Danae [place],   patient's wife [name] at bedside they had multiple questions -Palliative care consult appreciated patient is a DNR/DNI   13)status post fall with musculoskeletal injury/pain--concerns about shoulder elbow and wrist pain-- -right wrist right shoulder and right elbow x-rays without acute findings -Pain management as above -PT OT eval appreciated, recommends SNF rehab  10/16/22 -Remains medically stable for discharge to SNF facility when a bed can be found and insurance authorization can be obtained    14)Depression/mood disorder--- stop Seroquel due to oversedation,, continue Celexa -Be judicious with benzos  15) hypernatremia and hyperchloremia--- due to dehydration, received IV fluids   Disposition- 10/16/22 -Remains medically stable for discharge to SNF facility when a bed can be found and insurance authorization can be obtained ------------------------------------------------------------------------------------------------------------------------------------------------  DVT prophylaxis:  apixaban (ELIQUIS) tablet 2.5 mg Start: 10/08/22 1300 SCDs Start: 10/07/22 2054 apixaban (ELIQUIS) tablet 2.5 mg   Code Status:   Code Status: DNR  Family Communication: Daughter Magdalene River and Wife [name])  were updated at bedside The above findings and plan of care has been discussed with patient (and family)  in detail,  they expressed understanding and agreement of above. -Advance care planning has been discussed.   Disposition:,   From home pending SNF discharge (pending insurance approval) -Barriers:  10/16/22 -Remains medically stable for discharge to SNF facility when a bed can be found and insurance authorization can be obtained  Admission status:   Status is: Inpatient Remains inpatient appropriate because: Needing close evaluation, IV fluids, orthopedic evaluation, pain management   Procedures:   Antimicrobials:  Anti-infectives (From admission, onward)    Start  Dose/Rate Route Frequency Ordered Stop   10/13/22 0800  cefTRIAXone (ROCEPHIN) 1 g in sodium chloride 0.9 % 100 mL IVPB  Status:  Discontinued        1 g 200 mL/hr over 30 Minutes Intravenous Every 24 hours 10/13/22 0720 10/16/22 1441      Medication:   acetaminophen  650 mg Oral QID   amLODipine  10 mg Oral Daily   apixaban  2.5 mg Oral BID   atorvastatin  80 mg Oral Daily   citalopram  20 mg Oral Daily   digoxin  125 mcg Oral Daily   feeding supplement  237 mL Oral BID BM   folic acid  1 mg  Oral Daily   losartan  25 mg Oral Daily   methocarbamol  500 mg Oral TID   metoprolol succinate  12.5 mg Oral BID   multivitamin  1 tablet Oral QHS   senna-docusate  2 tablet Oral QHS   tamsulosin  0.4 mg Oral Daily   thiamine  100 mg Oral Daily   Or   thiamine  100 mg Intravenous Daily    acetaminophen **OR** acetaminophen, ALPRAZolam, HYDROmorphone (DILAUDID) injection, labetalol, metoprolol tartrate, ondansetron **OR** ondansetron (ZOFRAN) IV, oxyCODONE   Objective:   Vitals:   10/16/22 0422 10/16/22 0839 10/16/22 0853 10/16/22 1749  BP:  127/77  (!) 143/85  Pulse:  (!) 108 (!) 108 60  Resp:    16  Temp: (!) 97.5 F (36.4 C)   97.7 F (36.5 C)  TempSrc: Oral   Oral  SpO2:    100%  Weight:      Height:        Intake/Output Summary (Last 24 hours) at 10/16/2022 1818 Last data filed at 10/16/2022 1200 Gross per 24 hour  Intake 360 ml  Output 1 ml  Net 359 ml   Filed Weights   10/07/22 1431 10/08/22 1030  Weight: 52.2 kg 56.7 kg    Examination:    Physical Exam  Gen:-Awake,, cooperative, less agitated  HEENT:- [place].AT, No sclera icterus Neck-Supple Neck,No JVD,.  Lungs-  CTAB , fair air movement bilaterally  CV- S1, S2 normal, RRR Abd-  +ve B.Sounds, Abd Soft, No tenderness,    Neuro-generalized weakness, limited exam due to altered mentation , no additional new focal deficits, no tremors Extremities- good pulses,  MSK-right hip limited range of motion with pain with range of motion and palpation -Right wrist swelling is improving, discomfort with range of motion  ------------------------------------------------------------------------------------------------------------------------------------------   LABs:     Latest Ref Rng & Units 10/14/2022    3:52 AM 10/11/2022    3:22 AM 10/08/2022    4:37 AM  CBC  WBC 4.0 - 10.5 K/uL 13.1  8.4  5.6   Hemoglobin 13.0 - 17.0 g/dL 9.0  8.9  11.2   Hematocrit 39.0 - 52.0 % 28.2  26.9  33.4   Platelets 150 - 400  K/uL 199  158  166       Latest Ref Rng & Units 10/15/2022    7:10 AM 10/14/2022    3:52 AM 10/11/2022    3:22 AM  CMP  Glucose 70 - 99 mg/dL 106  147  158   BUN 8 - 23 mg/dL 36  35  29   Creatinine 0.61 - 1.24 mg/dL 1.56  1.66  1.48   Sodium 135 - 145 mmol/L 142  146  141   Potassium 3.5 - 5.1 mmol/L 3.7  3.7  4.4   Chloride  98 - 111 mmol/L 117  118  115   CO2 22 - 32 mmol/L '21  22  21   '$ Calcium 8.9 - 10.3 mg/dL 8.2  8.6  8.6    Radiology Reports No results found.  SIGNED: Roxan Hockey, MD,  [facility],  Pager (please use [email or web address] to page/text) Please use Epic Secure Chat for non-urgent communication (7AM-7PM)  If 7PM-7AM, please contact night-coverage [email or web address], 10/16/2022, 6:18 PM

## 2022-10-16 NOTE — Progress Notes (Signed)
Patient climbing out of bed. When this nurse and tech attempted to assist patient back to bed patient became agitated. Patient began kicking and punching. Unable to get patient to  calm down. Message sent to Dr. Pierce Crane through secure chat and medication order placed. Nurse and tech at bedside.

## 2022-10-17 DIAGNOSIS — R0902 Hypoxemia: Secondary | ICD-10-CM | POA: Diagnosis not present

## 2022-10-17 DIAGNOSIS — R627 Adult failure to thrive: Secondary | ICD-10-CM | POA: Diagnosis present

## 2022-10-17 DIAGNOSIS — S72001G Fracture of unspecified part of neck of right femur, subsequent encounter for closed fracture with delayed healing: Secondary | ICD-10-CM | POA: Diagnosis not present

## 2022-10-17 DIAGNOSIS — D649 Anemia, unspecified: Secondary | ICD-10-CM | POA: Diagnosis not present

## 2022-10-17 DIAGNOSIS — R296 Repeated falls: Secondary | ICD-10-CM | POA: Diagnosis not present

## 2022-10-17 DIAGNOSIS — Z79899 Other long term (current) drug therapy: Secondary | ICD-10-CM | POA: Diagnosis not present

## 2022-10-17 DIAGNOSIS — R4182 Altered mental status, unspecified: Secondary | ICD-10-CM | POA: Diagnosis not present

## 2022-10-17 DIAGNOSIS — R269 Unspecified abnormalities of gait and mobility: Secondary | ICD-10-CM | POA: Diagnosis not present

## 2022-10-17 DIAGNOSIS — I5042 Chronic combined systolic (congestive) and diastolic (congestive) heart failure: Secondary | ICD-10-CM | POA: Diagnosis not present

## 2022-10-17 DIAGNOSIS — R279 Unspecified lack of coordination: Secondary | ICD-10-CM | POA: Diagnosis not present

## 2022-10-17 DIAGNOSIS — R310 Gross hematuria: Secondary | ICD-10-CM | POA: Diagnosis not present

## 2022-10-17 DIAGNOSIS — W19XXXA Unspecified fall, initial encounter: Secondary | ICD-10-CM | POA: Diagnosis not present

## 2022-10-17 DIAGNOSIS — I480 Paroxysmal atrial fibrillation: Secondary | ICD-10-CM | POA: Diagnosis not present

## 2022-10-17 DIAGNOSIS — R197 Diarrhea, unspecified: Secondary | ICD-10-CM | POA: Diagnosis not present

## 2022-10-17 DIAGNOSIS — G9341 Metabolic encephalopathy: Secondary | ICD-10-CM | POA: Diagnosis not present

## 2022-10-17 DIAGNOSIS — Z681 Body mass index (BMI) 19 or less, adult: Secondary | ICD-10-CM | POA: Diagnosis not present

## 2022-10-17 DIAGNOSIS — F418 Other specified anxiety disorders: Secondary | ICD-10-CM | POA: Diagnosis not present

## 2022-10-17 DIAGNOSIS — R0600 Dyspnea, unspecified: Secondary | ICD-10-CM | POA: Diagnosis not present

## 2022-10-17 DIAGNOSIS — J449 Chronic obstructive pulmonary disease, unspecified: Secondary | ICD-10-CM | POA: Diagnosis not present

## 2022-10-17 DIAGNOSIS — S72001D Fracture of unspecified part of neck of right femur, subsequent encounter for closed fracture with routine healing: Secondary | ICD-10-CM | POA: Diagnosis not present

## 2022-10-17 DIAGNOSIS — N390 Urinary tract infection, site not specified: Secondary | ICD-10-CM | POA: Diagnosis not present

## 2022-10-17 DIAGNOSIS — F1010 Alcohol abuse, uncomplicated: Secondary | ICD-10-CM | POA: Diagnosis not present

## 2022-10-17 DIAGNOSIS — Z515 Encounter for palliative care: Secondary | ICD-10-CM | POA: Diagnosis not present

## 2022-10-17 DIAGNOSIS — R52 Pain, unspecified: Secondary | ICD-10-CM | POA: Diagnosis not present

## 2022-10-17 DIAGNOSIS — B338 Other specified viral diseases: Secondary | ICD-10-CM | POA: Diagnosis not present

## 2022-10-17 DIAGNOSIS — F419 Anxiety disorder, unspecified: Secondary | ICD-10-CM | POA: Diagnosis not present

## 2022-10-17 DIAGNOSIS — R195 Other fecal abnormalities: Secondary | ICD-10-CM | POA: Diagnosis not present

## 2022-10-17 DIAGNOSIS — Z7401 Bed confinement status: Secondary | ICD-10-CM | POA: Diagnosis not present

## 2022-10-17 DIAGNOSIS — M6281 Muscle weakness (generalized): Secondary | ICD-10-CM | POA: Diagnosis not present

## 2022-10-17 DIAGNOSIS — E785 Hyperlipidemia, unspecified: Secondary | ICD-10-CM | POA: Diagnosis not present

## 2022-10-17 DIAGNOSIS — S72009A Fracture of unspecified part of neck of unspecified femur, initial encounter for closed fracture: Secondary | ICD-10-CM | POA: Diagnosis not present

## 2022-10-17 DIAGNOSIS — Y92009 Unspecified place in unspecified non-institutional (private) residence as the place of occurrence of the external cause: Secondary | ICD-10-CM | POA: Diagnosis not present

## 2022-10-17 DIAGNOSIS — Z66 Do not resuscitate: Secondary | ICD-10-CM | POA: Diagnosis present

## 2022-10-17 DIAGNOSIS — S72143A Displaced intertrochanteric fracture of unspecified femur, initial encounter for closed fracture: Secondary | ICD-10-CM | POA: Diagnosis not present

## 2022-10-17 DIAGNOSIS — I4891 Unspecified atrial fibrillation: Secondary | ICD-10-CM | POA: Diagnosis not present

## 2022-10-17 DIAGNOSIS — Z7189 Other specified counseling: Secondary | ICD-10-CM | POA: Diagnosis not present

## 2022-10-17 DIAGNOSIS — R69 Illness, unspecified: Secondary | ICD-10-CM | POA: Diagnosis not present

## 2022-10-17 DIAGNOSIS — R5381 Other malaise: Secondary | ICD-10-CM | POA: Diagnosis not present

## 2022-10-17 DIAGNOSIS — R410 Disorientation, unspecified: Secondary | ICD-10-CM | POA: Diagnosis not present

## 2022-10-17 DIAGNOSIS — I6381 Other cerebral infarction due to occlusion or stenosis of small artery: Secondary | ICD-10-CM | POA: Diagnosis not present

## 2022-10-17 DIAGNOSIS — S72101D Unspecified trochanteric fracture of right femur, subsequent encounter for closed fracture with routine healing: Secondary | ICD-10-CM | POA: Diagnosis not present

## 2022-10-17 DIAGNOSIS — Z8249 Family history of ischemic heart disease and other diseases of the circulatory system: Secondary | ICD-10-CM | POA: Diagnosis not present

## 2022-10-17 DIAGNOSIS — R54 Age-related physical debility: Secondary | ICD-10-CM | POA: Diagnosis present

## 2022-10-17 DIAGNOSIS — Z87891 Personal history of nicotine dependence: Secondary | ICD-10-CM | POA: Diagnosis not present

## 2022-10-17 DIAGNOSIS — R404 Transient alteration of awareness: Secondary | ICD-10-CM | POA: Diagnosis not present

## 2022-10-17 DIAGNOSIS — R58 Hemorrhage, not elsewhere classified: Secondary | ICD-10-CM | POA: Diagnosis not present

## 2022-10-17 DIAGNOSIS — R319 Hematuria, unspecified: Secondary | ICD-10-CM | POA: Diagnosis not present

## 2022-10-17 DIAGNOSIS — E039 Hypothyroidism, unspecified: Secondary | ICD-10-CM | POA: Diagnosis not present

## 2022-10-17 DIAGNOSIS — Z955 Presence of coronary angioplasty implant and graft: Secondary | ICD-10-CM | POA: Diagnosis not present

## 2022-10-17 DIAGNOSIS — N179 Acute kidney failure, unspecified: Secondary | ICD-10-CM | POA: Diagnosis present

## 2022-10-17 DIAGNOSIS — S72001A Fracture of unspecified part of neck of right femur, initial encounter for closed fracture: Secondary | ICD-10-CM | POA: Diagnosis not present

## 2022-10-17 DIAGNOSIS — Z950 Presence of cardiac pacemaker: Secondary | ICD-10-CM | POA: Diagnosis not present

## 2022-10-17 DIAGNOSIS — N1832 Chronic kidney disease, stage 3b: Secondary | ICD-10-CM | POA: Diagnosis present

## 2022-10-17 DIAGNOSIS — I2510 Atherosclerotic heart disease of native coronary artery without angina pectoris: Secondary | ICD-10-CM | POA: Diagnosis not present

## 2022-10-17 DIAGNOSIS — I10 Essential (primary) hypertension: Secondary | ICD-10-CM | POA: Diagnosis not present

## 2022-10-17 DIAGNOSIS — I495 Sick sinus syndrome: Secondary | ICD-10-CM | POA: Diagnosis not present

## 2022-10-17 DIAGNOSIS — I4819 Other persistent atrial fibrillation: Secondary | ICD-10-CM | POA: Diagnosis present

## 2022-10-17 DIAGNOSIS — N183 Chronic kidney disease, stage 3 unspecified: Secondary | ICD-10-CM | POA: Diagnosis not present

## 2022-10-17 DIAGNOSIS — F13239 Sedative, hypnotic or anxiolytic dependence with withdrawal, unspecified: Secondary | ICD-10-CM | POA: Diagnosis present

## 2022-10-17 DIAGNOSIS — Z1152 Encounter for screening for COVID-19: Secondary | ICD-10-CM | POA: Diagnosis not present

## 2022-10-17 DIAGNOSIS — E7800 Pure hypercholesterolemia, unspecified: Secondary | ICD-10-CM | POA: Diagnosis present

## 2022-10-17 DIAGNOSIS — Z20828 Contact with and (suspected) exposure to other viral communicable diseases: Secondary | ICD-10-CM | POA: Diagnosis not present

## 2022-10-17 DIAGNOSIS — A0472 Enterocolitis due to Clostridium difficile, not specified as recurrent: Secondary | ICD-10-CM | POA: Diagnosis not present

## 2022-10-17 DIAGNOSIS — I130 Hypertensive heart and chronic kidney disease with heart failure and stage 1 through stage 4 chronic kidney disease, or unspecified chronic kidney disease: Secondary | ICD-10-CM | POA: Diagnosis present

## 2022-10-17 DIAGNOSIS — R41841 Cognitive communication deficit: Secondary | ICD-10-CM | POA: Diagnosis not present

## 2022-10-17 DIAGNOSIS — N189 Chronic kidney disease, unspecified: Secondary | ICD-10-CM | POA: Diagnosis not present

## 2022-10-17 DIAGNOSIS — Z789 Other specified health status: Secondary | ICD-10-CM | POA: Diagnosis not present

## 2022-10-17 DIAGNOSIS — X58XXXD Exposure to other specified factors, subsequent encounter: Secondary | ICD-10-CM | POA: Diagnosis present

## 2022-10-17 DIAGNOSIS — B379 Candidiasis, unspecified: Secondary | ICD-10-CM | POA: Diagnosis not present

## 2022-10-17 DIAGNOSIS — F0394 Unspecified dementia, unspecified severity, with anxiety: Secondary | ICD-10-CM | POA: Diagnosis present

## 2022-10-17 DIAGNOSIS — R293 Abnormal posture: Secondary | ICD-10-CM | POA: Diagnosis not present

## 2022-10-17 DIAGNOSIS — Z743 Need for continuous supervision: Secondary | ICD-10-CM | POA: Diagnosis not present

## 2022-10-17 DIAGNOSIS — B974 Respiratory syncytial virus as the cause of diseases classified elsewhere: Secondary | ICD-10-CM | POA: Diagnosis present

## 2022-10-17 LAB — CBC
HCT: 29.7 % — ABNORMAL LOW (ref 39.0–52.0)
Hemoglobin: 9.7 g/dL — ABNORMAL LOW (ref 13.0–17.0)
MCH: 33.9 pg (ref 26.0–34.0)
MCHC: 32.7 g/dL (ref 30.0–36.0)
MCV: 103.8 fL — ABNORMAL HIGH (ref 80.0–100.0)
Platelets: 296 10*3/uL (ref 150–400)
RBC: 2.86 MIL/uL — ABNORMAL LOW (ref 4.22–5.81)
RDW: 13.5 % (ref 11.5–15.5)
WBC: 10.7 10*3/uL — ABNORMAL HIGH (ref 4.0–10.5)
nRBC: 0 % (ref 0.0–0.2)

## 2022-10-17 LAB — RENAL FUNCTION PANEL
Albumin: 2.6 g/dL — ABNORMAL LOW (ref 3.5–5.0)
Anion gap: 7 (ref 5–15)
BUN: 31 mg/dL — ABNORMAL HIGH (ref 8–23)
CO2: 21 mmol/L — ABNORMAL LOW (ref 22–32)
Calcium: 8.3 mg/dL — ABNORMAL LOW (ref 8.9–10.3)
Chloride: 112 mmol/L — ABNORMAL HIGH (ref 98–111)
Creatinine, Ser: 1.42 mg/dL — ABNORMAL HIGH (ref 0.61–1.24)
GFR, Estimated: 51 mL/min — ABNORMAL LOW (ref >60)
Glucose, Bld: 104 mg/dL — ABNORMAL HIGH (ref 70–99)
Phosphorus: 2.8 mg/dL (ref 2.5–4.6)
Potassium: 3.8 mmol/L (ref 3.5–5.1)
Sodium: 140 mmol/L (ref 135–145)

## 2022-10-17 MED ORDER — METOPROLOL SUCCINATE ER 50 MG PO TB24: 50.0000 mg | ORAL_TABLET | Freq: Two times a day (BID) | ORAL | 3 refills | Status: DC

## 2022-10-17 MED ORDER — OXYCODONE HCL 5 MG PO TABS: 5.0000 mg | ORAL_TABLET | Freq: Three times a day (TID) | ORAL | 0 refills | Status: DC | PRN

## 2022-10-17 MED ORDER — METOPROLOL SUCCINATE ER 25 MG PO TB24
25.0000 mg | ORAL_TABLET | Freq: Two times a day (BID) | ORAL | Status: DC
  Administered 2022-10-17: 25 mg via ORAL
  Filled 2022-10-17: qty 1

## 2022-10-17 MED ORDER — RENA-VITE PO TABS: 1.0000 | ORAL_TABLET | Freq: Every day | ORAL | 2 refills | Status: DC

## 2022-10-17 MED ORDER — FUROSEMIDE 20 MG PO TABS: 20.0000 mg | ORAL_TABLET | Freq: Every day | ORAL | 11 refills | Status: DC

## 2022-10-17 MED ORDER — FLUCONAZOLE IN SODIUM CHLORIDE 200-0.9 MG/100ML-% IV SOLN
200.0000 mg | Freq: Once | INTRAVENOUS | Status: DC
  Filled 2022-10-17: qty 100

## 2022-10-17 MED ORDER — METOPROLOL SUCCINATE ER 50 MG PO TB24: 50.0000 mg | ORAL_TABLET | Freq: Every day | ORAL | 11 refills | Status: DC

## 2022-10-17 MED ORDER — ONDANSETRON HCL 4 MG PO TABS: 4.0000 mg | ORAL_TABLET | Freq: Four times a day (QID) | ORAL | 0 refills | Status: DC | PRN

## 2022-10-17 MED ORDER — ACETAMINOPHEN 325 MG PO TABS: 650.0000 mg | ORAL_TABLET | Freq: Four times a day (QID) | ORAL | 0 refills | Status: DC | PRN

## 2022-10-17 MED ORDER — SENNOSIDES-DOCUSATE SODIUM 8.6-50 MG PO TABS: 2.0000 | ORAL_TABLET | Freq: Every day | ORAL | 2 refills | Status: DC

## 2022-10-17 MED ORDER — LORAZEPAM 2 MG/ML IJ SOLN
0.5000 mg | Freq: Once | INTRAMUSCULAR | Status: DC
  Filled 2022-10-17: qty 1

## 2022-10-17 MED ORDER — VITAMIN B-1 100 MG PO TABS: 100.0000 mg | ORAL_TABLET | Freq: Every day | ORAL | 3 refills | Status: DC

## 2022-10-17 MED ORDER — LOSARTAN POTASSIUM 25 MG PO TABS: 25.0000 mg | ORAL_TABLET | Freq: Every day | ORAL | 3 refills | Status: DC

## 2022-10-17 MED ORDER — AMLODIPINE BESYLATE 5 MG PO TABS: 5.0000 mg | ORAL_TABLET | Freq: Every day | ORAL | 3 refills | Status: DC

## 2022-10-17 MED ORDER — FLUCONAZOLE 100 MG PO TABS
200.0000 mg | ORAL_TABLET | Freq: Once | ORAL | Status: AC
  Administered 2022-10-17: 200 mg via ORAL
  Filled 2022-10-17: qty 2

## 2022-10-17 MED ORDER — FLUCONAZOLE 150 MG PO TABS: 150.0000 mg | ORAL_TABLET | Freq: Every day | ORAL | 0 refills | Status: AC

## 2022-10-17 MED ORDER — METHOCARBAMOL 500 MG PO TABS: 500.0000 mg | ORAL_TABLET | Freq: Three times a day (TID) | ORAL | 0 refills | Status: DC

## 2022-10-17 MED ORDER — LORAZEPAM 1 MG PO TABS: 1.0000 mg | ORAL_TABLET | Freq: Two times a day (BID) | ORAL | 0 refills | Status: DC | PRN

## 2022-10-17 MED ORDER — FOLIC ACID 1 MG PO TABS: 1.0000 mg | ORAL_TABLET | Freq: Every day | ORAL | 3 refills | Status: DC

## 2022-10-17 NOTE — Progress Notes (Signed)
Report attempted but unsuccessful

## 2022-10-17 NOTE — Discharge Summary (Signed)
Ricardo Garrett, is a 76 y.o. male  DOB 10-05-1946  MRN [id number].  Admission date:  10/07/2022  Admitting Physician  Rolla Plate, DO  Discharge Date:  10/17/2022   Primary MD  [name], MD  Recommendations for primary care physician for things to follow:   1)Avoid ibuprofen/Advil/Aleve/Motrin/Goody Powders/Naproxen/BC powders/Meloxicam/Diclofenac/Indomethacin and other Nonsteroidal anti-inflammatory medications as these will make you more likely to bleed and can cause stomach ulcers, can also cause Kidney problems.   2)Repeat CBC and BMP in 5 to 7 days   3) consider outpatient palliative care consult  Admission Diagnosis  Hip fracture ([place]) [S72.009A] Closed fracture of trochanter of right femur, initial encounter ([place]) [S72.101A]   Discharge Diagnosis  Hip fracture ([place]) [S72.009A] Closed fracture of trochanter of right femur, initial encounter ([place]) [S72.101A]    Principal Problem:   Hip fracture ([place]) Active Problems:   Fall at home, initial encounter   Altered mental status   Alcohol use   Hypomagnesemia   Paroxysmal atrial fibrillation (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 12/2015: echo showing reduced EF of 35-40% b. NST: 09/2017: scar with peri-infarct ischemia, intermediate-risk study   Chronic kidney disease, stage 3 unspecified ([place]) 01/19/2016   COPD (chronic obstructive pulmonary disease) ([place])    "was on RX when he had CHF; not on anything now" (11/23/2017)   Coronary artery disease  involving native coronary artery of native heart with unstable angina pectoris (HCC)    High cholesterol    HOH (hard of hearing)    Hypertension    Hypoalbuminemia 01/19/2016   Mixed anxiety and depressive disorder 07/06/2022   Monoclonal gammopathy 01/14/2020   Persistent atrial fibrillation ([place])    a. s/p DCCV in 03/2016   Tachycardia-bradycardia syndrome [facility])     Past Surgical History:  Procedure Laterality Date   CARDIAC CATHETERIZATION  11/23/2017   CARDIOVERSION N/A 04/05/2016   Procedure: CARDIOVERSION;  Surgeon: Josue Hector, MD;  Location: [facility];  Service: Cardiovascular;  Laterality: N/A;   COMPRESSION HIP SCREW Right 03/15/2013   Procedure: COMPRESSION HIP;  Surgeon: Sanjuana Kava, MD;  Location: [facility] ORS;  Service: Orthopedics;  Laterality: Right;   CORONARY ATHERECTOMY N/A 11/24/2017   Procedure: CORONARY ATHERECTOMY;  Surgeon: Burnell Blanks, MD;  Location: [place] CV LAB;  Service: Cardiovascular;  Laterality: N/A;   CORONARY BALLOON ANGIOPLASTY N/A 11/24/2017   Procedure: CORONARY BALLOON ANGIOPLASTY;  Surgeon: Burnell Blanks, MD;  Location: [place] CV LAB;  Service: Cardiovascular;  Laterality: N/A;   CORONARY STENT INTERVENTION  N/A 11/24/2017   Procedure: CORONARY STENT INTERVENTION;  Surgeon: Burnell Blanks, MD;  Location: [place] CV LAB;  Service: Cardiovascular;  Laterality: N/A;   FRACTURE SURGERY     PACEMAKER IMPLANT N/A 11/27/2017   Procedure: PACEMAKER IMPLANT;  Surgeon: Evans Lance, MD;  Location: [place] CV LAB;  Service: Cardiovascular;  Laterality: N/A;   RIGHT/LEFT HEART CATH AND CORONARY ANGIOGRAPHY N/A 11/23/2017   Procedure: RIGHT/LEFT HEART CATH AND CORONARY ANGIOGRAPHY;  Surgeon: Burnell Blanks, MD;  Location: [place] CV LAB;  Service: Cardiovascular;  Laterality: N/A;   SKIN GRAFT Left    from right thigh   TONSILLECTOMY         HPI  from the history and physical done on the day of  admission:   HPI: Ricardo Garrett is a 76 y.o. male with medical history significant of arthritis, coronary artery disease, CHF, atrial fibrillation, hypertension, alcohol use, presents to the ED with a chief complaint of mechanical fall.  Patient is unfortunately not able to provide any history to me.  Per chart review he reported that he was walking his dog when he had a mechanical fall.  He reports that he fell because he was trying to move too quickly after the dog made him angry.  He landed on the side of his body.  His home health nurse arrived an hour later and called his daughter and they decided to bring him into the ED.  He was complaining of pain in his right hip and he was found to have a hip fracture.  Patient is on Eliquis and Plavix and Ortho requested that we hold those.    Review of Systems: unable to review all systems due to the inability of the patient to answer questions.   Hospital Course:    Assessment and Plan: 1)Hip fracture ([place]) - Right Hip Fracture CT right hip shows acute mildly comminuted fracture of the greater trochanter in ipsilateral side as previous hip surgery  -Orthopedic surgeon Dr Amedeo Kinsman recommends nonoperative management -P.o. oxycodone alternating with IV fentanyl as needed for pain control -Methocarbamol as ordered -Get PT OT after adequate pain control -Patient will most likely need SNF level rehab -Orthopedic surgeon recommends:-weightbearing as tolerated using a walker, with limited hip extension and abduction.  After he has had a chance to ambulate, we will plan to repeat x-rays.  this can be completed in clinic as outpatient  - Weight Bearing Status/Activity: Protected weightbearing as tolerated, using a walker at all times.  Limited hip extension and hip abduction - 10/17/22 -Remains medically stable for discharge to SNF facility when a bed can be found and insurance authorization can be obtained   2)Acute metabolic encephalopathy superimposed on  underlying dementia----multifactorial etiology for encephalopathy -[name] Ricardo Garrett and patient's daughter Ricardo Garrett reports history of cognitive deficits/decline at baseline... -some h/o alcohol and THC use..  Currently receiving benzos and opiates - CT head from 09/19/2022 and repeat CTA from 10/12/2022 without acute intracranial abnormalities -Will be more judicious with opiates and benzos at this time -Dementia with cognitive memory deficits and occasional behavioral problems noted   3)Fall at home, initial encounter -Described as a mechanical fall to ED staff -Resulting hip fracture -not a surgical case -Recurrent falls---PTA pt lived at home and did very poorly, patient has significant limitations with mobility related ADLs- this patient needs to continue to be monitored in the hospital until a SNF bed is obtained as she is not safe to go home  with her current physcical limitations     4)UTI--- culture with yeast -Initially treated with Rocephin  -Okay to discharge on Diflucan   5)FEN--poor oral intake, give nutritional supplements and IV fluids until oral intake for intake improves -Avoid excessive daytime sedation to allow for patient to be awake enough to have adequate oral intake   6)PAFib--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 St Jude dual chamber by Dr Lovena Le  -continue digoxin and metoprolol for rate control -Continue Eliquis for stroke prophylaxis -Echo from January 2022 with moderately dilated left atrium and EF of 55%   7) alcohol use concerns- Blood alcohol level was 77 on admission -Please see #2 above    8)H/o CAD-Had abnormal stress test and set up for cath 11/23/17 with orbital atherectomy and stenting of mid circumflex with DES and PCI small OM branch done by Dr Veronda Prude -Continue Plavix, metoprolol and Lipitor -Echo from January 2022 with EF of 55%   9)HTN---continue amlodipine , losartan, and metoprolol XL   10)Hypomagnesemia -- Replace  and recheck   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 --Improved with hydration - renally adjust medications, avoid nephrotoxic agents / dehydration  / hypotension   12)social/ethics--- plan of care discussed with patient's daughters Danae Orleans,   patient's wife Ricardo Garrett at bedside they had multiple questions -Palliative care consult appreciated patient is a DNR/DNI    13)status post fall with musculoskeletal injury/pain--concerns about shoulder elbow and wrist pain-- -right wrist right shoulder and right elbow x-rays without acute findings -Pain management as above -PT OT eval appreciated, recommends SNF rehab  10/17/22 -Remains medically stable for discharge to SNF facility when a bed can be found and insurance authorization can be obtained   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--- stop Seroquel due to oversedation,, continue Celexa -Be judicious with benzos   15) hypernatremia and hyperchloremia--- due to dehydration, received IV fluids    Disposition- Remains medically stable for discharge to SNF facility when a bed can be found and insurance authorization can be obtained  Discharge Condition: stable  Follow UP   Contact information for after-discharge care     Destination     [facility] .   Service: Skilled Nursing Contact information: [address] [place] [phone]                      Consults obtained - palliative  Diet and Activity recommendation:  As advised  Discharge Instructions    Discharge Instructions     Call MD for:  difficulty breathing, headache or visual disturbances   Complete by: As directed    Call MD for:  persistant dizziness or light-headedness   Complete by: As directed    Call MD for:  persistant nausea and vomiting   Complete by: As directed    Call MD for:  severe uncontrolled pain   Complete by: As directed    Call MD for:  temperature >100.4    Complete by: As directed    Diet - low sodium heart healthy   Complete by: As directed    Discharge instructions   Complete by: As directed    1)Avoid ibuprofen/Advil/Aleve/Motrin/Goody Powders/Naproxen/BC powders/Meloxicam/Diclofenac/Indomethacin and other Nonsteroidal anti-inflammatory medications as these will make you more likely to bleed and can cause stomach ulcers, can also cause Kidney problems.   2)Repeat CBC and BMP in 5 to 7 days   3) consider outpatient palliative care consult   Increase activity slowly  Complete by: As directed          Discharge Medications     Allergies as of 10/17/2022   No Known Allergies      Medication List     STOP taking these medications    HYDROcodone-acetaminophen 7.5-325 MG tablet Commonly known as: NORCO   Lokelma 10 g Pack packet Generic drug: sodium zirconium cyclosilicate       TAKE these medications    acetaminophen 325 MG tablet Commonly known as: TYLENOL Take 2 tablets (650 mg total) by mouth every 6 (six) hours as needed for mild pain, headache or fever.   amLODipine 5 MG tablet Commonly known as: NORVASC Take 1 tablet (5 mg total) by mouth daily. What changed: Another medication with the same name was removed. Continue taking this medication, and follow the directions you see here.   apixaban 2.5 MG Tabs tablet Commonly known as: Eliquis Take 1 tablet (2.5 mg total) by mouth 2 (two) times daily.   atorvastatin 80 MG tablet Commonly known as: LIPITOR Take 1 tablet (80 mg total) by mouth daily.   clopidogrel 75 MG tablet Commonly known as: PLAVIX Take 1 tablet by mouth once daily   diclofenac Sodium 1 % Gel Commonly known as: VOLTAREN Apply 2 g topically 4 (four) times daily.   digoxin 0.125 MG tablet Commonly known as: LANOXIN Take 1 tablet (125 mcg total) by mouth daily.   escitalopram 5 MG tablet Commonly known as: LEXAPRO Take 5 mg by mouth daily.   fluconazole 150 MG tablet Commonly  known as: Diflucan Take 1 tablet (150 mg total) by mouth daily for 3 days. For yeast infection   folic acid 1 MG tablet Commonly known as: FOLVITE Take 1 tablet (1 mg total) by mouth daily. Start taking on: October 18, 2022   furosemide 20 MG tablet Commonly known as: Lasix Take 1 tablet (20 mg total) by mouth daily. What changed:  medication strength how much to take   LORazepam 1 MG tablet Commonly known as: Ativan Take 1 tablet (1 mg total) by mouth 2 (two) times daily as needed for anxiety.   losartan 25 MG tablet Commonly known as: COZAAR Take 1 tablet (25 mg total) by mouth daily. Start taking on: October 18, 2022 What changed:  medication strength how much to take   methocarbamol 500 MG tablet Commonly known as: ROBAXIN Take 1 tablet (500 mg total) by mouth 3 (three) times daily.   metoprolol succinate 50 MG 24 hr tablet Commonly known as: Toprol XL Take 1 tablet (50 mg total) by mouth daily. Take with or immediately following a meal. What changed: You were already taking a medication with the same name, and this prescription was added. Make sure you understand how and when to take each.   metoprolol succinate 50 MG 24 hr tablet Commonly known as: TOPROL-XL Take 1 tablet (50 mg total) by mouth 2 (two) times daily. What changed:  medication strength how much to take how to take this when to take this additional instructions   multivitamin Tabs tablet Take 1 tablet by mouth at bedtime.   ondansetron 4 MG tablet Commonly known as: ZOFRAN Take 1 tablet (4 mg total) by mouth every 6 (six) hours as needed for nausea.   oxyCODONE 5 MG immediate release tablet Commonly known as: Oxy IR/ROXICODONE Take 1 tablet (5 mg total) by mouth every 8 (eight) hours as needed for moderate pain.   senna-docusate 8.6-50 MG tablet Commonly known as: Senokot-S  Take 2 tablets by mouth at bedtime.   tamsulosin 0.4 MG Caps capsule Commonly known as: FLOMAX Take 0.4 mg by  mouth daily.   thiamine 100 MG tablet Commonly known as: Vitamin B-1 Take 1 tablet (100 mg total) by mouth daily. Start taking on: October 18, 2022        Major procedures and Radiology Reports - PLEASE review detailed and final reports for all details, in brief -   CT HEAD WO CONTRAST (5MM)  Result Date: 10/12/2022 CLINICAL DATA:  Altered level of consciousness, agitation, confusion EXAM: CT HEAD WITHOUT CONTRAST TECHNIQUE: Contiguous axial images were obtained from the base of the skull through the vertex without intravenous contrast. RADIATION DOSE REDUCTION: This exam was performed according to the departmental dose-optimization program which includes automated exposure control, adjustment of the mA and/or kV according to patient size and/or use of iterative reconstruction technique. COMPARISON:  10/10/2022 FINDINGS: Brain: Chronic small vessel ischemic changes are again seen within the periventricular white matter and basal ganglia, stable. No signs of acute infarct or hemorrhage. Stable calcified structure within the fourth ventricle, with no evidence of hydrocephalus. The lateral ventricles and remaining midline structures are stable. No acute extra-axial fluid collections. No mass effect. Vascular: No hyperdense vessel or unexpected calcification. Skull: Normal. Negative for fracture or focal lesion. Sinuses/Orbits: No acute finding. Other: None. IMPRESSION: 1. Stable exam, no acute intracranial process. 2. 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 Electronically Signed   By: Randa Ngo M.D.   On: 10/12/2022 15:22   DG CHEST PORT 1 VIEW  Result Date: 10/12/2022 CLINICAL DATA:  Shortness of breath. EXAM: PORTABLE CHEST 1 VIEW COMPARISON:  09/09/2022 and CT chest 07/27/2022. FINDINGS: Trachea is midline. Heart size is accentuated by AP technique. Pacemaker lead tips are in the right atrium and right ventricle. Mild diffuse interstitial  prominence and indistinctness. Probable small right pleural effusion. IMPRESSION: Mild congestive heart failure. Electronically Signed   By: Lorin Picket M.D.   On: 10/12/2022 14:12   CT HEAD WO CONTRAST (5MM)  Result Date: 10/10/2022 CLINICAL DATA:  Altered mental status EXAM: CT HEAD WITHOUT CONTRAST TECHNIQUE: Contiguous axial images were obtained from the base of the skull through the vertex without intravenous contrast. RADIATION DOSE REDUCTION: This exam was performed according to the departmental dose-optimization program which includes automated exposure control, adjustment of the mA and/or kV according to patient size and/or use of iterative reconstruction technique. COMPARISON:  CT Head 09/09/22 FINDINGS: Brain: Redemonstrated sequela of severe chronic microvascular ischemic change with advanced generalized volume loss. 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 No evidence of acute infarction, hemorrhage, hydrocephalus, extra-axial collection. Unchanged calcified mass of the floor of the fourth ventricle. Vascular: No hyperdense vessel or unexpected calcification. Skull: Normal. Negative for fracture or focal lesion. Sinuses/Orbits: No acute finding. Other: None. IMPRESSION: 1. No acute intracranial abnormality. 2. Redemonstrated sequela of severe chronic microvascular ischemic change with advanced generalized volume loss and multiple chronic infarcts. 3. Unchanged calcified mass of the floor of the fourth ventricle. No hydrocephalus. Electronically Signed   By: Marin Roberts M.D.   On: 10/10/2022 13:03   DG Wrist 2 Views Right  Result Date: 10/09/2022 CLINICAL DATA:  Right wrist pain. EXAM: RIGHT WRIST - 2 VIEW COMPARISON:  None Available. FINDINGS: There is no evidence of fracture or dislocation. Mild ulna minus variance. Degenerative change of the thumb carpal metacarpal joint. Suspect remote fracture of the third proximal metacarpal.  Subchondral cystic change in  the distal radius and ulna. Prominent vascular calcifications. Mild soft tissue edema. IMPRESSION: 1. No acute osseous abnormality. 2. Mild ulna minus variance and degenerative change of the radiocarpal and thumb carpometacarpal joint. Electronically Signed   By: Keith Rake M.D.   On: 10/09/2022 20:26   DG Shoulder 1V Right  Result Date: 10/09/2022 CLINICAL DATA:  Fall at home with right shoulder pain EXAM: RIGHT SHOULDER - 1 VIEW COMPARISON:  Chest radiograph dated 09/09/2022 FINDINGS: There is no evidence of fracture or dislocation. There is no evidence of arthropathy or other focal bone abnormality. Soft tissues are unremarkable. IMPRESSION: No acute fracture or dislocation. Electronically Signed   By: Darrin Nipper M.D.   On: 10/09/2022 11:37   DG Elbow 2 Views Right  Result Date: 10/09/2022 CLINICAL DATA:  Fall at home with right elbow pain EXAM: RIGHT ELBOW - 2 VIEW COMPARISON:  Right elbow radiographs dated 10/07/2022 FINDINGS: There is no evidence of fracture, dislocation, or joint effusion. There is no evidence of arthropathy or other focal bone abnormality. Soft tissues are unremarkable. IMPRESSION: No acute fracture or dislocation. Electronically Signed   By: Darrin Nipper M.D.   On: 10/09/2022 11:36   CT Hip Right Wo Contrast  Result Date: 10/07/2022 CLINICAL DATA:  Fall, right hip fracture. EXAM: CT OF THE RIGHT HIP WITHOUT CONTRAST TECHNIQUE: Multidetector CT imaging of the right hip was performed according to the standard protocol. Multiplanar CT image reconstructions were also generated. RADIATION DOSE REDUCTION: This exam was performed according to the departmental dose-optimization program which includes automated exposure control, adjustment of the mA and/or kV according to patient size and/or use of iterative reconstruction technique. COMPARISON:  Pelvic radiograph 10/07/2022 FINDINGS: Bones/Joint/Cartilage There is deformity of the right proximal femur with a right hip screw in  place, single proximal cerclage and 4 interlocking screws along the lateral plate component. New/acute mildly comminuted fracture the greater trochanter encompassing the attachment sites of the gluteus medius and minimus. The fracture plane extends down to the upper margin of the plate but does not appear to extend further along the prostatic or traverse in a definite intertrochanteric manner. No other regional fracture observed. Ligaments Suboptimally assessed by CT. Muscles and Tendons Edema tracks along the atrophic right iliopsoas tendon and distal muscle. Low-level edema along the distal gluteus medius and minimus. Soft tissues Prominent median lobe of the prostate gland indents the bladder base. A direct right inguinal hernia contains adipose tissue and a loop of small bowel without complicating feature. Extensive atherosclerotic vascular disease noted. IMPRESSION: 1. Acute mildly comminuted fracture of the right greater trochanter encompassing the attachment sites of the gluteus medius and minimus. The fracture plane extends down to the upper margin of the plate but does not appear to extend further along the prostatic or extend in a definite intertrochanteric manner. 2. Edema tracks along the atrophic right iliopsoas tendon and distal muscle. 3. Direct right inguinal hernia contains adipose tissue and a loop of small bowel without complicating feature. 4. Prominent median lobe of the prostate gland indents the bladder base. 5. Extensive atherosclerotic vascular disease. Electronically Signed   By: Van Clines M.D.   On: 10/07/2022 18:53   DG Elbow Complete Right  Result Date: 10/07/2022 CLINICAL DATA:  Right elbow pain after fall. EXAM: RIGHT ELBOW - COMPLETE 3+ VIEW COMPARISON:  None Available. FINDINGS: There is no evidence of fracture, dislocation, or joint effusion. There is no evidence of arthropathy or other focal bone abnormality.  Soft tissues are unremarkable. IMPRESSION: Negative.  Electronically Signed   By: Marijo Conception M.D.   On: 10/07/2022 15:55   DG Pelvis 1-2 Views  Result Date: 10/07/2022 CLINICAL DATA:  Fall. EXAM: PELVIS - 1-2 VIEW COMPARISON:  June 17, 2021. FINDINGS: Status post surgical internal fixation of old proximal right femoral fracture. No acute fracture or dislocation is noted. IMPRESSION: No acute abnormality seen. Electronically Signed   By: Marijo Conception M.D.   On: 10/07/2022 15:54    Micro Results   Recent Results (from the past 240 hour(s))  Urine Culture     Status: Abnormal   Collection Time: 10/13/22  4:11 AM   Specimen: Urine, Clean Catch  Result Value Ref Range Status   Specimen Description   Final    URINE, CLEAN CATCH Performed at [facility], [address] [place], [place] [address]    Special Requests   Final    NONE Performed at [facility], [address], [place], [place] [address]    Culture >=100,000 COLONIES/mL YEAST (A)  Final   Report Status 10/14/2022 FINAL  Final    Today   Subjective    Pollyann Samples today has no new complaints  He slid out of bed earlier, support staff had to help him up from the floor back onto bed - -vital signs acceptable . -No obvious injury at this time   Patient has been seen and examined prior to discharge   Objective   Blood pressure (!) 151/85, pulse (!) 124, temperature 98.2 F (36.8 C), temperature source Oral, resp. rate 20, height '5\' 8"'$  (1.727 m), weight 56.7 kg, SpO2 99 %.   Intake/Output Summary (Last 24 hours) at 10/17/2022 1346 Last data filed at 10/17/2022 0900 Gross per 24 hour  Intake 240 ml  Output 550 ml  Net -310 ml    Exam Gen:-Awake,, cooperative, less agitated  HEENT:- [place].AT, No sclera icterus Neck-Supple Neck,No JVD,.  Lungs-  CTAB , fair air movement bilaterally  CV- S1, S2 normal, RRR Abd-  +ve B.Sounds, Abd Soft, No tenderness,    Neuro-generalized weakness, limited exam due to altered mentation , no additional new focal deficits, no  tremors Extremities- good pulses,  MSK-right hip limited range of motion with pain with range of motion and palpation -Right wrist swelling much improved, no significant discomfort with range of motion   Data Review   CBC w Diff:  Lab Results  Component Value Date   WBC 10.7 (H) 10/17/2022   HGB 9.7 (L) 10/17/2022   HCT 29.7 (L) 10/17/2022   HCT 48 02/12/2016   PLT 296 10/17/2022   LYMPHOPCT 13 10/08/2022   LYMPHOPCT 17 02/12/2016   MONOPCT 14 10/08/2022   MONOPCT 10 02/12/2016   EOSPCT 1 10/08/2022   EOSPCT 4 02/12/2016   BASOPCT 1 10/08/2022    CMP:  Lab Results  Component Value Date   NA 140 10/17/2022   NA 136 02/12/2016   K 3.8 10/17/2022   K 5.6 02/12/2016   CL 112 (H) 10/17/2022   CL 97 02/12/2016   CO2 21 (L) 10/17/2022   CO2 26 02/12/2016   BUN 31 (H) 10/17/2022   BUN 34 02/12/2016   CREATININE 1.42 (H) 10/17/2022   CREATININE 1.27 (H) 04/21/2016   PROT 5.8 (L) 10/08/2022   PROT 5.8 02/12/2016   ALBUMIN 2.6 (L) 10/17/2022   ALBUMIN 3.2 02/12/2016   BILITOT 1.1 10/08/2022   BILITOT 0.4 02/12/2016   ALKPHOS 91 10/08/2022  ALKPHOS 155 02/12/2016   AST 17 10/08/2022   AST 27 02/12/2016   ALT 13 10/08/2022   ALT 41 02/12/2016  .  Total Discharge time is about 33 minutes  Roxan Hockey M.D on 10/17/2022 at 1:46 PM  Go to [email or web address] -  for contact info  [facility] - Office  [phone]

## 2022-10-17 NOTE — TOC Transition Note (Signed)
Transition of Care [facility]) - CM/SW Discharge Note   Patient Details  Name: KEIGAN TAFOYA MRN: [id number] Date of Birth: [birth year]  Transition of Care  [facility]) CM/SW Contact:  Ihor Gully, LCSW Phone Number: 10/17/2022, 2:46 PM   Clinical Narrative:    D/c clinicals sent to facility. Nurse will call report. EMS to transpor.t TOC signing off.    Final next level of care: Skilled Nursing Facility Barriers to Discharge: No Barriers Identified   Patient Goals and CMS Choice Patient states their goals for this hospitalization and ongoing recovery are:: wife agreeable to SNF CMS Medicare.gov Compare Post Acute Care list provided to:: Patient Represenative (must comment) Choice offered to / list presented to : Spouse  Discharge Placement              Patient chooses bed at: Other - please specify in the comment section below: [facility]) Patient to be transferred to facility by: [place] Name of family member notified: vm left for spouse Patient and family notified of of transfer: 10/17/22  Discharge Plan and Services                                     Social Determinants of Health (SDOH) Interventions     Readmission Risk Interventions    10/11/2022    2:59 PM  Readmission Risk Prevention Plan  Transportation Screening Complete  PCP or Specialist Appt within 3-5 Days Not Complete  HRI or [place] Complete  Social Work Consult for [place] Planning/Counseling Complete  Palliative Care Screening Not Applicable  Medication Review Press photographer) Complete

## 2022-10-17 NOTE — Care Management Important Message (Signed)
Important Message  Patient Details  Name: Ricardo Garrett MRN: [id number] Date of Birth: 09-20-1946   Medicare Important Message Given:  Yes     Tommy Medal 10/17/2022, 2:31 PM

## 2022-10-17 NOTE — Progress Notes (Signed)
Pt pleasantly confused this shift. Pt has tried climbing oob multiple times but is easily redirected. No c/o pain or discomfort. All needs met. Plan of care ongoing.

## 2022-10-17 NOTE — Progress Notes (Signed)
Report given to [facility] LPN

## 2022-10-17 NOTE — Discharge Instructions (Signed)
1)Avoid ibuprofen/Advil/Aleve/Motrin/Goody Powders/Naproxen/BC powders/Meloxicam/Diclofenac/Indomethacin and other Nonsteroidal anti-inflammatory medications as these will make you more likely to bleed and can cause stomach ulcers, can also cause Kidney problems.   2)Repeat CBC and BMP in 5 to 7 days   3) consider outpatient palliative care consult

## 2022-10-17 NOTE — Progress Notes (Signed)
     Referral previously received for Ricardo Garrett for goals of care discussion. Noted most recent palliative in-person assessment dated 10/12/2022 at which time it was recommended to follow from a distance/chart check.   Chart reviewed for Recent provider notes, nurse notes, TOC notes, vitals, labs, and imaging and updates received from RN.   At this time patient appears stable/improved. Vitals stable, leucocytosis improved/improving, stable hgb and renal function.No new imaging. Planned d/c possibly today to SNF for continued care/rehab. Recommended continued outpatient palliative given history of dementia.   No plan for in person follow-up at this time. Please contact the palliative medicine provider on service for any new/urgent needs that require our assistance with this patient.  Thank you for your referral and allowing PMT to assist in [place] care.   [name], NP Palliative Medicine Team Phone: [phone]  NO CHARGE

## 2022-10-17 NOTE — Progress Notes (Signed)
   10/17/22 1236  What Happened  Was fall witnessed? No  Was patient injured? No (has broke  hip and Shoulder pre admision)  Patient found on floor  Found by Staff-comment Janett Billow P)  Stated prior activity ambulating-unassisted  Provider Notification  Provider Name/Title Emokpae courage  Date Provider Notified 10/17/22  Time Provider Notified 1239  Method of Notification Call  Notification Reason Fall  Provider response No new orders  Date of Provider Response 10/17/22  Time of Provider Response 15  Follow Up  Family notified Yes - comment  Time family notified 1243  Additional tests No  Progress note created (see row info) Yes  Blank note created Yes  Adult Fall Risk Assessment  Risk Factor Category (scoring not indicated) High fall risk per protocol (document High fall risk)  Patient Fall Risk Level High fall risk  Adult Fall Risk Interventions  Required Bundle Interventions *See Row Information* High fall risk - low, moderate, and high requirements implemented  Additional Interventions Use of appropriate toileting equipment (bedpan, BSC, etc.)  Screening for Fall Injury Risk (To be completed on HIGH fall risk patients) - Assessing Need for Floor Mats  Risk For Fall Injury- Criteria for Floor Mats Admitted as a result of a fall  Will Implement Floor Mats Yes  Vitals  Temp 98.2 F (36.8 C)  Temp Source Oral  BP (!) 151/85  MAP (mmHg) 106  Pulse Rate (!) 124  Resp 20  Oxygen Therapy  SpO2 99 %  Pain Assessment  Pain Scale 0-10  Pain Score 0  Neurological  Level of Consciousness Alert   MD called and notified no new orders at this time. PT had a bed  alarm, fall band and socks, and fall mat in place at the time of fall. Pt denies hitting his head and was found on his knees by NT. Vitals obtained and family called.

## 2022-10-19 DIAGNOSIS — R296 Repeated falls: Secondary | ICD-10-CM | POA: Diagnosis not present

## 2022-10-19 DIAGNOSIS — I4891 Unspecified atrial fibrillation: Secondary | ICD-10-CM | POA: Diagnosis not present

## 2022-10-19 DIAGNOSIS — B379 Candidiasis, unspecified: Secondary | ICD-10-CM | POA: Diagnosis not present

## 2022-10-19 DIAGNOSIS — S72001G Fracture of unspecified part of neck of right femur, subsequent encounter for closed fracture with delayed healing: Secondary | ICD-10-CM | POA: Diagnosis not present

## 2022-10-19 DIAGNOSIS — M6281 Muscle weakness (generalized): Secondary | ICD-10-CM | POA: Diagnosis not present

## 2022-10-19 DIAGNOSIS — R69 Illness, unspecified: Secondary | ICD-10-CM | POA: Diagnosis not present

## 2022-10-19 DIAGNOSIS — I2510 Atherosclerotic heart disease of native coronary artery without angina pectoris: Secondary | ICD-10-CM | POA: Diagnosis not present

## 2022-10-19 DIAGNOSIS — G9341 Metabolic encephalopathy: Secondary | ICD-10-CM | POA: Diagnosis not present

## 2022-10-20 DIAGNOSIS — R69 Illness, unspecified: Secondary | ICD-10-CM | POA: Diagnosis not present

## 2022-10-20 DIAGNOSIS — R296 Repeated falls: Secondary | ICD-10-CM | POA: Diagnosis not present

## 2022-10-20 DIAGNOSIS — N189 Chronic kidney disease, unspecified: Secondary | ICD-10-CM | POA: Diagnosis not present

## 2022-10-20 DIAGNOSIS — Z950 Presence of cardiac pacemaker: Secondary | ICD-10-CM | POA: Diagnosis not present

## 2022-10-20 DIAGNOSIS — G9341 Metabolic encephalopathy: Secondary | ICD-10-CM | POA: Diagnosis not present

## 2022-10-20 DIAGNOSIS — I4891 Unspecified atrial fibrillation: Secondary | ICD-10-CM | POA: Diagnosis not present

## 2022-10-20 DIAGNOSIS — S72001D Fracture of unspecified part of neck of right femur, subsequent encounter for closed fracture with routine healing: Secondary | ICD-10-CM | POA: Diagnosis not present

## 2022-10-21 ENCOUNTER — Ambulatory Visit (INDEPENDENT_AMBULATORY_CARE_PROVIDER_SITE_OTHER): Payer: Medicare HMO

## 2022-10-21 DIAGNOSIS — I2510 Atherosclerotic heart disease of native coronary artery without angina pectoris: Secondary | ICD-10-CM | POA: Diagnosis not present

## 2022-10-21 DIAGNOSIS — S72001G Fracture of unspecified part of neck of right femur, subsequent encounter for closed fracture with delayed healing: Secondary | ICD-10-CM | POA: Diagnosis not present

## 2022-10-21 DIAGNOSIS — M6281 Muscle weakness (generalized): Secondary | ICD-10-CM | POA: Diagnosis not present

## 2022-10-21 DIAGNOSIS — I4891 Unspecified atrial fibrillation: Secondary | ICD-10-CM | POA: Diagnosis not present

## 2022-10-21 DIAGNOSIS — R296 Repeated falls: Secondary | ICD-10-CM | POA: Diagnosis not present

## 2022-10-21 DIAGNOSIS — R69 Illness, unspecified: Secondary | ICD-10-CM | POA: Diagnosis not present

## 2022-10-21 DIAGNOSIS — G9341 Metabolic encephalopathy: Secondary | ICD-10-CM | POA: Diagnosis not present

## 2022-10-21 DIAGNOSIS — B379 Candidiasis, unspecified: Secondary | ICD-10-CM | POA: Diagnosis not present

## 2022-10-21 DIAGNOSIS — I495 Sick sinus syndrome: Secondary | ICD-10-CM

## 2022-10-25 LAB — CUP PACEART REMOTE DEVICE CHECK
Battery Remaining Longevity: 70 mo
Battery Remaining Percentage: 58 %
Battery Voltage: 3.01 V
Brady Statistic RV Percent Paced: 53 %
Date Time Interrogation Session: 20231222181953
Implantable Lead Connection Status: 753985
Implantable Lead Connection Status: 753985
Implantable Lead Implant Date: 20190128
Implantable Lead Implant Date: 20190128
Implantable Lead Location: 753859
Implantable Lead Location: 753860
Implantable Pulse Generator Implant Date: 20190128
Lead Channel Impedance Value: 430 Ohm
Lead Channel Pacing Threshold Amplitude: 0.75 V
Lead Channel Pacing Threshold Pulse Width: 0.5 ms
Lead Channel Sensing Intrinsic Amplitude: 11.9 mV
Lead Channel Setting Pacing Amplitude: 2.5 V
Lead Channel Setting Pacing Pulse Width: 0.5 ms
Lead Channel Setting Sensing Sensitivity: 2 mV
Pulse Gen Model: 2272
Pulse Gen Serial Number: 8982069

## 2022-10-26 DIAGNOSIS — R195 Other fecal abnormalities: Secondary | ICD-10-CM | POA: Diagnosis not present

## 2022-10-27 ENCOUNTER — Other Ambulatory Visit: Payer: Self-pay

## 2022-10-27 ENCOUNTER — Emergency Department (HOSPITAL_COMMUNITY)
Admission: EM | Admit: 2022-10-27 | Discharge: 2022-10-28 | Disposition: A | Discharge status: Skilled Nursing Facility | Payer: Medicare HMO | Source: Home / Self Care | Attending: Emergency Medicine | Admitting: Emergency Medicine

## 2022-10-27 ENCOUNTER — Emergency Department (HOSPITAL_COMMUNITY): Payer: Medicare HMO

## 2022-10-27 DIAGNOSIS — R310 Gross hematuria: Secondary | ICD-10-CM | POA: Diagnosis not present

## 2022-10-27 DIAGNOSIS — R319 Hematuria, unspecified: Secondary | ICD-10-CM | POA: Insufficient documentation

## 2022-10-27 DIAGNOSIS — Z1152 Encounter for screening for COVID-19: Secondary | ICD-10-CM | POA: Insufficient documentation

## 2022-10-27 DIAGNOSIS — R410 Disorientation, unspecified: Secondary | ICD-10-CM | POA: Insufficient documentation

## 2022-10-27 DIAGNOSIS — I6381 Other cerebral infarction due to occlusion or stenosis of small artery: Secondary | ICD-10-CM | POA: Diagnosis not present

## 2022-10-27 DIAGNOSIS — R197 Diarrhea, unspecified: Secondary | ICD-10-CM | POA: Insufficient documentation

## 2022-10-27 DIAGNOSIS — B974 Respiratory syncytial virus as the cause of diseases classified elsewhere: Secondary | ICD-10-CM | POA: Insufficient documentation

## 2022-10-27 DIAGNOSIS — Z79899 Other long term (current) drug therapy: Secondary | ICD-10-CM | POA: Insufficient documentation

## 2022-10-27 DIAGNOSIS — R4182 Altered mental status, unspecified: Secondary | ICD-10-CM | POA: Insufficient documentation

## 2022-10-27 DIAGNOSIS — I10 Essential (primary) hypertension: Secondary | ICD-10-CM | POA: Diagnosis not present

## 2022-10-27 DIAGNOSIS — B338 Other specified viral diseases: Secondary | ICD-10-CM

## 2022-10-27 LAB — COMPREHENSIVE METABOLIC PANEL
ALT: 29 U/L (ref 0–44)
AST: 41 U/L (ref 15–41)
Albumin: 2.8 g/dL — ABNORMAL LOW (ref 3.5–5.0)
Alkaline Phosphatase: 117 U/L (ref 38–126)
Anion gap: 5 (ref 5–15)
BUN: 23 mg/dL (ref 8–23)
CO2: 20 mmol/L — ABNORMAL LOW (ref 22–32)
Calcium: 7.6 mg/dL — ABNORMAL LOW (ref 8.9–10.3)
Chloride: 114 mmol/L — ABNORMAL HIGH (ref 98–111)
Creatinine, Ser: 1.94 mg/dL — ABNORMAL HIGH (ref 0.61–1.24)
GFR, Estimated: 35 mL/min — ABNORMAL LOW (ref >60)
Glucose, Bld: 99 mg/dL (ref 70–99)
Potassium: 3.9 mmol/L (ref 3.5–5.1)
Sodium: 139 mmol/L (ref 135–145)
Total Bilirubin: 0.5 mg/dL (ref 0.3–1.2)
Total Protein: 5.8 g/dL — ABNORMAL LOW (ref 6.5–8.1)

## 2022-10-27 LAB — RESP PANEL BY RT-PCR (RSV, FLU A&B, COVID)  RVPGX2
Influenza A by PCR: NEGATIVE
Influenza B by PCR: NEGATIVE
Resp Syncytial Virus by PCR: POSITIVE — AB
SARS Coronavirus 2 by RT PCR: NEGATIVE

## 2022-10-27 LAB — URINALYSIS, ROUTINE W REFLEX MICROSCOPIC: Bacteria, UA: NONE SEEN

## 2022-10-27 LAB — CBC WITH DIFFERENTIAL/PLATELET
Abs Immature Granulocytes: 0.04 10*3/uL (ref 0.00–0.07)
Basophils Absolute: 0 10*3/uL (ref 0.0–0.1)
Basophils Relative: 1 %
Eosinophils Absolute: 0 10*3/uL (ref 0.0–0.5)
Eosinophils Relative: 1 %
HCT: 29.4 % — ABNORMAL LOW (ref 39.0–52.0)
Hemoglobin: 9.4 g/dL — ABNORMAL LOW (ref 13.0–17.0)
Immature Granulocytes: 1 %
Lymphocytes Relative: 12 %
Lymphs Abs: 0.8 10*3/uL (ref 0.7–4.0)
MCH: 32.8 pg (ref 26.0–34.0)
MCHC: 32 g/dL (ref 30.0–36.0)
MCV: 102.4 fL — ABNORMAL HIGH (ref 80.0–100.0)
Monocytes Absolute: 0.7 10*3/uL (ref 0.1–1.0)
Monocytes Relative: 12 %
Neutro Abs: 4.7 10*3/uL (ref 1.7–7.7)
Neutrophils Relative %: 73 %
Platelets: 319 10*3/uL (ref 150–400)
RBC: 2.87 MIL/uL — ABNORMAL LOW (ref 4.22–5.81)
RDW: 14.3 % (ref 11.5–15.5)
WBC: 6.3 10*3/uL (ref 4.0–10.5)
nRBC: 0 % (ref 0.0–0.2)

## 2022-10-27 LAB — MAGNESIUM: Magnesium: 1.7 mg/dL (ref 1.7–2.4)

## 2022-10-27 LAB — AMMONIA: Ammonia: <10 umol/L (ref 9–35)

## 2022-10-27 MED ORDER — CEPHALEXIN 500 MG PO CAPS: 500.0000 mg | ORAL_CAPSULE | Freq: Three times a day (TID) | ORAL | 0 refills | Status: DC

## 2022-10-27 MED ORDER — SODIUM CHLORIDE 0.9 % IV SOLN
1.0000 g | Freq: Once | INTRAVENOUS | Status: AC
  Administered 2022-10-27: 1 g via INTRAVENOUS
  Filled 2022-10-27: qty 10

## 2022-10-27 MED ORDER — SODIUM CHLORIDE 0.9 % IV BOLUS
1000.0000 mL | Freq: Once | INTRAVENOUS | Status: AC
  Administered 2022-10-27: 1000 mL via INTRAVENOUS

## 2022-10-27 NOTE — Discharge Instructions (Addendum)
Summary:  Ricardo Garrett was seen in the ER for persistent diarrhea, concerns for dehydration, and blood from his urethral meatus.  He had a workup performed in the ER which included a CT scan of his head, urine test, and blood work.  His test show that he was positive for RSV, respiratory virus.  His urinalysis was grossly bloody, and it was difficult to determine whether there may be an infection.  A urine culture will be sent.  In the meantime, it was reasonable to treat with antibiotics for possible urine infection, and IV Rocephin was given.  Oral Keflex was prescribed.  Please follow-up on his urine cultures this week.  There was a report that the patient may have tested positive for C. difficile at his facility.  If this is the case he will need oral antibiotic therapy, either with oral vancomycin or Flagyl, and this should be ordered by the facility provider.  His blood test otherwise did not show signs of anemia, or significant dehydration.  The CT scan of his brain did not show any cause of worsening confusion or stroke.  I suspect that his confusion may be related to progressing of his dementia, as well as delirium related to benzos and opioid use.  Please try to avoid giving regular doses of benzos and opioids.  His daughter was present for our workup in the ER.

## 2022-10-27 NOTE — ED Notes (Addendum)
Per pts daughter at bedside pt was being tested for UTI today as he has been more confused recently. Daughter also states pt has not been eating well since he broke his right hip on Dec. 8th.

## 2022-10-27 NOTE — ED Provider Notes (Signed)
[facility] EMERGENCY DEPARTMENT Provider Note   CSN: [id number] Arrival date & time: 10/27/22  1849     History  Chief Complaint  Patient presents with   Altered Mental Status    Ricardo Garrett is a 76 y.o. male presenting emergency department with altered mental status, concern for potential C. difficile infection.  History provided by patient's daughter and EMS as the patient is confused and cannot provide history and is level 5 caveat.  The patient been discharged in the hospital 10 days ago to skilled nursing facility after medical admission for hip fracture, which is deemed to be nonoperative, it was noted to have acute metabolic encephalopathy superimposed on underlying dementia, felt to be perhaps related to benzos and opiates, which were continued at discharge.  He was also noted in the hospital to have an AKI that improved with hydration, low magnesium level, paroxysmal A-fib.  He was also noted to have a UTI culture growing with yeast, and was discharged on Diflucan.  He presents today with concern for diarrhea at his facility.  Per EMS report the patient tested positive for C. difficile today but has not been started on antibiotics (I do not see antibiotics prescribed on his medication list either).  The patient himself appears quite confused, is pointing at the ceiling and shouting, and his daughter reports that this has been a rapid decline in his cognitive status since he was brought into the hospital with his hip fracture 2 weeks ago.  HPI     Home Medications Prior to Admission medications   Medication Sig Start Date End Date Taking? Authorizing Provider  acetaminophen (TYLENOL) 325 MG tablet Take 2 tablets (650 mg total) by mouth every 6 (six) hours as needed for mild pain, headache or fever. 10/17/22  Yes Emokpae, Courage, MD  amLODipine (NORVASC) 5 MG tablet Take 1 tablet (5 mg total) by mouth daily. 10/17/22  Yes Emokpae, Courage, MD  apixaban (ELIQUIS) 2.5 MG TABS  tablet Take 1 tablet (2.5 mg total) by mouth 2 (two) times daily. 05/20/19  Yes Josue Hector, MD  atorvastatin (LIPITOR) 80 MG tablet Take 1 tablet (80 mg total) by mouth daily. 05/02/19  Yes Josue Hector, MD  cephALEXin (KEFLEX) 500 MG capsule Take 1 capsule (500 mg total) by mouth 3 (three) times daily for 5 days. 10/28/22 11/02/22 Yes Nevyn Bossman, Carola Rhine, MD  clopidogrel (PLAVIX) 75 MG tablet Take 1 tablet by mouth once daily 03/31/20  Yes Josue Hector, MD  digoxin (LANOXIN) 0.125 MG tablet Take 1 tablet (125 mcg total) by mouth daily. 05/02/19  Yes Josue Hector, MD  escitalopram (LEXAPRO) 5 MG tablet Take 5 mg by mouth daily.   Yes [provider]  folic acid (FOLVITE) 1 MG tablet Take 1 tablet (1 mg total) by mouth daily. 10/18/22  Yes Emokpae, Courage, MD  furosemide (LASIX) 20 MG tablet Take 1 tablet (20 mg total) by mouth daily. 10/17/22 10/17/23 Yes Emokpae, Courage, MD  LORazepam (ATIVAN) 1 MG tablet Take 1 tablet (1 mg total) by mouth 2 (two) times daily as needed for anxiety. 10/17/22 10/17/23 Yes Emokpae, Courage, MD  losartan (COZAAR) 25 MG tablet Take 1 tablet (25 mg total) by mouth daily. 10/18/22  Yes Emokpae, Courage, MD  methocarbamol (ROBAXIN) 500 MG tablet Take 1 tablet (500 mg total) by mouth 3 (three) times daily. 10/17/22  Yes Emokpae, Courage, MD  metoprolol succinate (TOPROL-XL) 50 MG 24 hr tablet Take 1 tablet (50 mg total)  by mouth 2 (two) times daily. 10/17/22  Yes Emokpae, Courage, MD  multivitamin (RENA-VIT) TABS tablet Take 1 tablet by mouth at bedtime. 10/17/22  Yes Emokpae, Courage, MD  ondansetron (ZOFRAN) 4 MG tablet Take 1 tablet (4 mg total) by mouth every 6 (six) hours as needed for nausea. 10/17/22  Yes Emokpae, Courage, MD  oxyCODONE (OXY IR/ROXICODONE) 5 MG immediate release tablet Take 1 tablet (5 mg total) by mouth every 8 (eight) hours as needed for moderate pain. 10/17/22  Yes Emokpae, Courage, MD  senna-docusate (SENOKOT-S) 8.6-50 MG tablet Take  2 tablets by mouth at bedtime. 10/17/22  Yes Emokpae, Courage, MD  tamsulosin (FLOMAX) 0.4 MG CAPS capsule Take 0.4 mg by mouth daily. 10/08/20  Yes [provider]  thiamine (VITAMIN B-1) 100 MG tablet Take 1 tablet (100 mg total) by mouth daily. 10/18/22  Yes Roxan Hockey, MD      Allergies    Patient has no known allergies.    Review of Systems   Review of Systems  Physical Exam Updated Vital Signs BP 116/79   Pulse 90   Temp 98.1 F (36.7 C)   Resp 20   Ht '5\' 8"'$  (1.727 m)   Wt 56 kg   SpO2 92%   BMI 18.77 kg/m  Physical Exam Constitutional:      General: He is not in acute distress. HENT:     Head: Normocephalic and atraumatic.  Eyes:     Conjunctiva/sclera: Conjunctivae normal.     Pupils: Pupils are equal, round, and reactive to light.  Cardiovascular:     Rate and Rhythm: Normal rate and regular rhythm.  Pulmonary:     Effort: Pulmonary effort is normal. No respiratory distress.  Abdominal:     General: There is no distension.     Tenderness: There is no abdominal tenderness.  Skin:    General: Skin is warm and dry.  Neurological:     General: No focal deficit present.     Mental Status: He is alert. Mental status is at baseline.     ED Results / Procedures / Treatments   Labs (all labs ordered are listed, but only abnormal results are displayed) Labs Reviewed  RESP PANEL BY RT-PCR (RSV, FLU A&B, COVID)  RVPGX2 - Abnormal; Notable for the following components:      Result Value   Resp Syncytial Virus by PCR POSITIVE (*)    All other components within normal limits  COMPREHENSIVE METABOLIC PANEL - Abnormal; Notable for the following components:   Chloride 114 (*)    CO2 20 (*)    Creatinine, Ser 1.94 (*)    Calcium 7.6 (*)    Total Protein 5.8 (*)    Albumin 2.8 (*)    GFR, Estimated 35 (*)    All other components within normal limits  CBC WITH DIFFERENTIAL/PLATELET - Abnormal; Notable for the following components:   RBC 2.87 (*)     Hemoglobin 9.4 (*)    HCT 29.4 (*)    MCV 102.4 (*)    All other components within normal limits  URINALYSIS, ROUTINE W REFLEX MICROSCOPIC - Abnormal; Notable for the following components:   Color, Urine RED (*)    APPearance CLOUDY (*)    Glucose, UA   (*)    Value: TEST NOT REPORTED DUE TO COLOR INTERFERENCE OF URINE PIGMENT   Hgb urine dipstick   (*)    Value: TEST NOT REPORTED DUE TO COLOR INTERFERENCE OF URINE PIGMENT  Bilirubin Urine   (*)    Value: TEST NOT REPORTED DUE TO COLOR INTERFERENCE OF URINE PIGMENT   Ketones, ur   (*)    Value: TEST NOT REPORTED DUE TO COLOR INTERFERENCE OF URINE PIGMENT   Protein, ur   (*)    Value: TEST NOT REPORTED DUE TO COLOR INTERFERENCE OF URINE PIGMENT   Nitrite   (*)    Value: TEST NOT REPORTED DUE TO COLOR INTERFERENCE OF URINE PIGMENT   Leukocytes,Ua   (*)    Value: TEST NOT REPORTED DUE TO COLOR INTERFERENCE OF URINE PIGMENT   All other components within normal limits  C DIFFICILE QUICK SCREEN W PCR REFLEX    URINE CULTURE  MAGNESIUM  AMMONIA    EKG EKG Interpretation  Date/Time:  Thursday October 27 2022 19:23:44 EST Ventricular Rate:  89 PR Interval:    QRS Duration: 101 QT Interval:  435 QTC Calculation: 512 R Axis:   230 Text Interpretation: Atrial fibrillation Ventricular premature complex Probable anterolateral infarct, age indeterm Prolonged QT interval Confirmed by Octaviano Glow [phone]) on 10/27/2022 10:25:13 PM  Radiology CT Head Wo Contrast  Result Date: 10/27/2022 CLINICAL DATA:  Mental status change, unknown cause EXAM: CT HEAD WITHOUT CONTRAST TECHNIQUE: Contiguous axial images were obtained from the base of the skull through the vertex without intravenous contrast. RADIATION DOSE REDUCTION: This exam was performed according to the departmental dose-optimization program which includes automated exposure control, adjustment of the mA and/or kV according to patient size and/or use of iterative reconstruction  technique. COMPARISON:  10/12/2022 FINDINGS: Brain: No evidence of acute infarction, hemorrhage, hydrocephalus or, extra-axial collection. Stable calcified lesion in the fourth ventricle measuring up to 1.8 cm. Chronic lacunar infarcts in the bilateral basal ganglia. Patchy low-density changes within the periventricular and subcortical white matter compatible with chronic microvascular ischemic change. Moderate diffuse cerebral volume loss. Vascular: Atherosclerotic calcifications involving the large vessels of the skull base. No unexpected hyperdense vessel. Skull: Normal. Negative for fracture or focal lesion. Sinuses/Orbits: Mild diffuse paranasal sinus mucosal thickening with air-fluid levels in the sphenoid sinuses. Partial bilateral mastoid effusions. Other: None. IMPRESSION: 1. No acute intracranial abnormality. 2. 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 No hydrocephalus. 3. Chronic microvascular ischemic change and cerebral volume loss. 4. Paranasal sinus disease with air-fluid levels in the sphenoid sinuses. Correlate for acute sinusitis. 5. Partial bilateral mastoid effusions. Electronically Signed   By: Davina Poke D.O.   On: 10/27/2022 22:00    Procedures Procedures    Medications Ordered in ED Medications  cefTRIAXone (ROCEPHIN) 1 g in sodium chloride 0.9 % 100 mL IVPB (has no administration in time range)  sodium chloride 0.9 % bolus 1,000 mL (1,000 mLs Intravenous New Bag/Given 10/27/22 2236)    ED Course/ Medical Decision Making/ A&P Clinical Course as of 10/27/22 2332  Thu Oct 27, 2022  2224 Respiratory Syncytial Virus by PCR(!): POSITIVE [MT]  2317 UA frankly bloody, but hgb near baseline.  This may be 2/2 infection or trauma from catheterization at facility.  RSV is also positive.  Otherwise workup looks unremarkable. [MT]    Clinical Course User Index [MT] Jailyn Langhorst, Carola Rhine, MD                           Medical Decision Making Amount and/or Complexity of  Data Reviewed Labs: ordered. Decision-making details documented in ED Course. Radiology: ordered.  Risk Prescription drug management.   This patient  presents to the Emergency Department with complaint of altered mental status.  This involves an extensive number of treatment options, and is a complaint that carries with it a high risk of complications and morbidity.  The differential diagnosis includes hypoglycemia vs metabolic encephalopathy vs infection (including cystitis) vs ICH vs stroke vs polypharmacy vs other  I ordered, reviewed, and interpreted labs, including RSV is positive.  CMP shows some mild elevation of creatinine but no doubling from baseline.  Hemoglobin is stable at 9.4 near baseline level.  No leukocytosis.  Ammonia and mag are within normal limits.  UA is grossly bloody, difficult to run further tests on this, will try to send urine culture I ordered medication fluid bolus for hydration.  Rocephin bolus for potential UTI I ordered imaging studies which included CT head I independently visualized and interpreted imaging which showed no acute infarct and the monitor tracing which showed normal sinus rhythm Additional history was obtained from EMS and the patient's daughter at bedside Previous records obtained and reviewed showing recent hospitalization same discharge summary as noted above I personally reviewed the patients ECG which showed sinus rhythm with no acute ischemic findings  After the interventions stated above, I reevaluated the patient and found patient remained stable, awake, appears to be mentating close to his recent baseline.  Instructions written were provided for the facility at the time of discharge.  Patient has a C. difficile test pending at the time of discharge, but I informed that he already tested positive at his facility.  Therefore I recommended to them that they prescribe the appropriate antibiotics orally to manage C. Difficile; patient appears to  be awake and is able to tolerate fluids in the ED, therefore should be able to take oral antibiotics.         Final Clinical Impression(s) / ED Diagnoses Final diagnoses:  Hematuria, unspecified type  Diarrhea, unspecified type  RSV infection    Rx / DC Orders ED Discharge Orders          Ordered    cephALEXin (KEFLEX) 500 MG capsule  3 times daily        10/27/22 2330              Wyvonnia Dusky, MD 10/27/22 2332

## 2022-10-27 NOTE — ED Triage Notes (Signed)
Pt brought in by [facility] from [place] c/o blood coming from penis. EMS states facility did an in and out cath on pt and noticed blood and wanted pt to be evaluated.   Pt tested positive Cdiff today.

## 2022-10-28 DIAGNOSIS — B338 Other specified viral diseases: Secondary | ICD-10-CM | POA: Diagnosis not present

## 2022-10-28 DIAGNOSIS — A0472 Enterocolitis due to Clostridium difficile, not specified as recurrent: Secondary | ICD-10-CM | POA: Diagnosis not present

## 2022-10-28 LAB — C DIFFICILE QUICK SCREEN W PCR REFLEX
C Diff antigen: POSITIVE — AB
C Diff interpretation: DETECTED
C Diff toxin: POSITIVE — AB

## 2022-10-29 ENCOUNTER — Other Ambulatory Visit: Payer: Self-pay

## 2022-10-29 ENCOUNTER — Inpatient Hospital Stay (HOSPITAL_COMMUNITY)
Admission: EM | Admit: 2022-10-29 | Discharge: 2022-11-03 | DRG: 696 | Disposition: A | Discharge status: Hospice | Payer: Medicare HMO | Source: Skilled Nursing Facility | Attending: Internal Medicine | Admitting: Internal Medicine

## 2022-10-29 DIAGNOSIS — Z66 Do not resuscitate: Secondary | ICD-10-CM

## 2022-10-29 DIAGNOSIS — Z1152 Encounter for screening for COVID-19: Secondary | ICD-10-CM

## 2022-10-29 DIAGNOSIS — R4182 Altered mental status, unspecified: Secondary | ICD-10-CM | POA: Diagnosis present

## 2022-10-29 DIAGNOSIS — R410 Disorientation, unspecified: Secondary | ICD-10-CM | POA: Diagnosis present

## 2022-10-29 DIAGNOSIS — R451 Restlessness and agitation: Secondary | ICD-10-CM

## 2022-10-29 DIAGNOSIS — S72111D Displaced fracture of greater trochanter of right femur, subsequent encounter for closed fracture with routine healing: Secondary | ICD-10-CM

## 2022-10-29 DIAGNOSIS — I4819 Other persistent atrial fibrillation: Secondary | ICD-10-CM | POA: Diagnosis present

## 2022-10-29 DIAGNOSIS — R310 Gross hematuria: Principal | ICD-10-CM | POA: Diagnosis present

## 2022-10-29 DIAGNOSIS — F13239 Sedative, hypnotic or anxiolytic dependence with withdrawal, unspecified: Secondary | ICD-10-CM | POA: Diagnosis present

## 2022-10-29 DIAGNOSIS — Z79899 Other long term (current) drug therapy: Secondary | ICD-10-CM

## 2022-10-29 DIAGNOSIS — J449 Chronic obstructive pulmonary disease, unspecified: Secondary | ICD-10-CM | POA: Diagnosis present

## 2022-10-29 DIAGNOSIS — Z681 Body mass index (BMI) 19 or less, adult: Secondary | ICD-10-CM

## 2022-10-29 DIAGNOSIS — F1010 Alcohol abuse, uncomplicated: Secondary | ICD-10-CM | POA: Diagnosis present

## 2022-10-29 DIAGNOSIS — Z950 Presence of cardiac pacemaker: Secondary | ICD-10-CM

## 2022-10-29 DIAGNOSIS — R251 Tremor, unspecified: Secondary | ICD-10-CM | POA: Diagnosis present

## 2022-10-29 DIAGNOSIS — I2510 Atherosclerotic heart disease of native coronary artery without angina pectoris: Secondary | ICD-10-CM | POA: Diagnosis present

## 2022-10-29 DIAGNOSIS — Z515 Encounter for palliative care: Secondary | ICD-10-CM

## 2022-10-29 DIAGNOSIS — R404 Transient alteration of awareness: Secondary | ICD-10-CM | POA: Diagnosis not present

## 2022-10-29 DIAGNOSIS — F0394 Unspecified dementia, unspecified severity, with anxiety: Secondary | ICD-10-CM | POA: Diagnosis present

## 2022-10-29 DIAGNOSIS — I5042 Chronic combined systolic (congestive) and diastolic (congestive) heart failure: Secondary | ICD-10-CM | POA: Diagnosis present

## 2022-10-29 DIAGNOSIS — X58XXXD Exposure to other specified factors, subsequent encounter: Secondary | ICD-10-CM | POA: Diagnosis present

## 2022-10-29 DIAGNOSIS — N1832 Chronic kidney disease, stage 3b: Secondary | ICD-10-CM | POA: Diagnosis present

## 2022-10-29 DIAGNOSIS — S72111A Displaced fracture of greater trochanter of right femur, initial encounter for closed fracture: Secondary | ICD-10-CM | POA: Diagnosis present

## 2022-10-29 DIAGNOSIS — H9190 Unspecified hearing loss, unspecified ear: Secondary | ICD-10-CM | POA: Diagnosis present

## 2022-10-29 DIAGNOSIS — R319 Hematuria, unspecified: Secondary | ICD-10-CM

## 2022-10-29 DIAGNOSIS — Z7189 Other specified counseling: Secondary | ICD-10-CM

## 2022-10-29 DIAGNOSIS — Z7901 Long term (current) use of anticoagulants: Secondary | ICD-10-CM

## 2022-10-29 DIAGNOSIS — Z87891 Personal history of nicotine dependence: Secondary | ICD-10-CM

## 2022-10-29 DIAGNOSIS — R0902 Hypoxemia: Secondary | ICD-10-CM | POA: Diagnosis not present

## 2022-10-29 DIAGNOSIS — R0600 Dyspnea, unspecified: Secondary | ICD-10-CM

## 2022-10-29 DIAGNOSIS — Z743 Need for continuous supervision: Secondary | ICD-10-CM | POA: Diagnosis not present

## 2022-10-29 DIAGNOSIS — Z955 Presence of coronary angioplasty implant and graft: Secondary | ICD-10-CM

## 2022-10-29 DIAGNOSIS — N183 Chronic kidney disease, stage 3 unspecified: Secondary | ICD-10-CM | POA: Diagnosis present

## 2022-10-29 DIAGNOSIS — I480 Paroxysmal atrial fibrillation: Secondary | ICD-10-CM | POA: Diagnosis present

## 2022-10-29 DIAGNOSIS — N179 Acute kidney failure, unspecified: Secondary | ICD-10-CM

## 2022-10-29 DIAGNOSIS — R54 Age-related physical debility: Secondary | ICD-10-CM | POA: Diagnosis present

## 2022-10-29 DIAGNOSIS — B974 Respiratory syncytial virus as the cause of diseases classified elsewhere: Secondary | ICD-10-CM | POA: Diagnosis present

## 2022-10-29 DIAGNOSIS — R52 Pain, unspecified: Secondary | ICD-10-CM

## 2022-10-29 DIAGNOSIS — I10 Essential (primary) hypertension: Secondary | ICD-10-CM | POA: Diagnosis not present

## 2022-10-29 DIAGNOSIS — Z20828 Contact with and (suspected) exposure to other viral communicable diseases: Secondary | ICD-10-CM | POA: Diagnosis present

## 2022-10-29 DIAGNOSIS — E7800 Pure hypercholesterolemia, unspecified: Secondary | ICD-10-CM | POA: Diagnosis present

## 2022-10-29 DIAGNOSIS — R627 Adult failure to thrive: Secondary | ICD-10-CM | POA: Diagnosis present

## 2022-10-29 DIAGNOSIS — Z7902 Long term (current) use of antithrombotics/antiplatelets: Secondary | ICD-10-CM

## 2022-10-29 DIAGNOSIS — A0472 Enterocolitis due to Clostridium difficile, not specified as recurrent: Secondary | ICD-10-CM | POA: Diagnosis present

## 2022-10-29 DIAGNOSIS — Z8249 Family history of ischemic heart disease and other diseases of the circulatory system: Secondary | ICD-10-CM

## 2022-10-29 DIAGNOSIS — I130 Hypertensive heart and chronic kidney disease with heart failure and stage 1 through stage 4 chronic kidney disease, or unspecified chronic kidney disease: Secondary | ICD-10-CM | POA: Diagnosis present

## 2022-10-29 DIAGNOSIS — Z833 Family history of diabetes mellitus: Secondary | ICD-10-CM

## 2022-10-29 LAB — BASIC METABOLIC PANEL
Anion gap: 3 — ABNORMAL LOW (ref 5–15)
BUN: 24 mg/dL — ABNORMAL HIGH (ref 8–23)
CO2: 21 mmol/L — ABNORMAL LOW (ref 22–32)
Calcium: 8.2 mg/dL — ABNORMAL LOW (ref 8.9–10.3)
Chloride: 117 mmol/L — ABNORMAL HIGH (ref 98–111)
Creatinine, Ser: 1.89 mg/dL — ABNORMAL HIGH (ref 0.61–1.24)
GFR, Estimated: 36 mL/min — ABNORMAL LOW (ref >60)
Glucose, Bld: 103 mg/dL — ABNORMAL HIGH (ref 70–99)
Potassium: 4.3 mmol/L (ref 3.5–5.1)
Sodium: 141 mmol/L (ref 135–145)

## 2022-10-29 LAB — CBC
HCT: 28.8 % — ABNORMAL LOW (ref 39.0–52.0)
Hemoglobin: 9.1 g/dL — ABNORMAL LOW (ref 13.0–17.0)
MCH: 32.7 pg (ref 26.0–34.0)
MCHC: 31.6 g/dL (ref 30.0–36.0)
MCV: 103.6 fL — ABNORMAL HIGH (ref 80.0–100.0)
Platelets: 307 10*3/uL (ref 150–400)
RBC: 2.78 MIL/uL — ABNORMAL LOW (ref 4.22–5.81)
RDW: 14.3 % (ref 11.5–15.5)
WBC: 6.7 10*3/uL (ref 4.0–10.5)
nRBC: 0 % (ref 0.0–0.2)

## 2022-10-29 LAB — URINALYSIS, MICROSCOPIC (REFLEX): RBC / HPF: >50 RBC/hpf (ref 0–5)

## 2022-10-29 LAB — URINALYSIS, ROUTINE W REFLEX MICROSCOPIC

## 2022-10-29 LAB — URINE CULTURE: Culture: NO GROWTH

## 2022-10-29 MED ORDER — ONDANSETRON HCL 4 MG PO TABS: 4.0000 mg | ORAL_TABLET | Freq: Four times a day (QID) | ORAL | Status: DC | PRN

## 2022-10-29 MED ORDER — LACTATED RINGERS IV BOLUS: 500.0000 mL | Freq: Once | INTRAVENOUS | Status: DC

## 2022-10-29 MED ORDER — SODIUM CHLORIDE 0.9 % IR SOLN
3000.0000 mL | Status: DC
  Administered 2022-10-29 – 2022-10-30 (×3): 3000 mL

## 2022-10-29 MED ORDER — ACETAMINOPHEN 650 MG RE SUPP: 650.0000 mg | Freq: Four times a day (QID) | RECTAL | Status: DC | PRN

## 2022-10-29 MED ORDER — SODIUM CHLORIDE 0.9 % IV SOLN
1.0000 g | INTRAVENOUS | Status: AC
  Administered 2022-10-29: 1 g via INTRAVENOUS
  Filled 2022-10-29: qty 10

## 2022-10-29 MED ORDER — HYDROMORPHONE HCL 1 MG/ML IJ SOLN
0.2500 mg | Freq: Once | INTRAMUSCULAR | Status: AC
  Administered 2022-10-29: 0.25 mg via INTRAVENOUS
  Filled 2022-10-29: qty 0.5

## 2022-10-29 MED ORDER — ACETAMINOPHEN 325 MG PO TABS: 650.0000 mg | ORAL_TABLET | Freq: Four times a day (QID) | ORAL | Status: DC | PRN

## 2022-10-29 MED ORDER — LORAZEPAM 2 MG/ML IJ SOLN
0.5000 mg | Freq: Four times a day (QID) | INTRAMUSCULAR | Status: DC | PRN
  Administered 2022-10-30: 0.5 mg via INTRAVENOUS
  Filled 2022-10-29: qty 1

## 2022-10-29 MED ORDER — FIDAXOMICIN 200 MG PO TABS
200.0000 mg | ORAL_TABLET | Freq: Two times a day (BID) | ORAL | Status: DC
  Filled 2022-10-29 (×4): qty 1

## 2022-10-29 MED ORDER — METOPROLOL SUCCINATE ER 50 MG PO TB24
50.0000 mg | ORAL_TABLET | Freq: Once | ORAL | Status: DC
  Filled 2022-10-29: qty 1

## 2022-10-29 MED ORDER — SODIUM CHLORIDE 0.9 % IV BOLUS
500.0000 mL | Freq: Once | INTRAVENOUS | Status: AC
  Administered 2022-10-29: 500 mL via INTRAVENOUS

## 2022-10-29 MED ORDER — VANCOMYCIN HCL 125 MG PO CAPS
125.0000 mg | ORAL_CAPSULE | Freq: Four times a day (QID) | ORAL | Status: DC
  Filled 2022-10-29 (×7): qty 1

## 2022-10-29 MED ORDER — MORPHINE SULFATE (PF) 2 MG/ML IV SOLN
2.0000 mg | INTRAVENOUS | Status: DC | PRN
  Administered 2022-10-30 – 2022-10-31 (×4): 2 mg via INTRAVENOUS
  Administered 2022-11-01 (×2): 4 mg via INTRAVENOUS
  Administered 2022-11-01: 2 mg via INTRAVENOUS
  Filled 2022-10-29: qty 2
  Filled 2022-10-29 (×3): qty 1
  Filled 2022-10-29: qty 2
  Filled 2022-10-29 (×4): qty 1

## 2022-10-29 MED ORDER — ONDANSETRON HCL 4 MG/2ML IJ SOLN: 4.0000 mg | Freq: Four times a day (QID) | INTRAMUSCULAR | Status: DC | PRN

## 2022-10-29 NOTE — ED Provider Notes (Signed)
[facility] EMERGENCY DEPARTMENT Provider Note   CSN: [id number] Arrival date & time: 10/29/22  1150     History  Chief Complaint  Patient presents with   Hematuria    Ricardo Garrett is a 76 y.o. male.  76 year old male with a history of prior alcohol abuse, CHF, CAD on Plavix, CKD, COPD, dementia, atrial fibrillation on Eliquis who presents emergency department with hematuria.  2 days ago patient was seen in the emergency department with hematuria and altered mental status.  Was found to have mild AKI and was given ceftriaxone for possible UTI.  Urine culture resulted as negative.  Was also having diarrhea also had a C. difficile that was sent which was positive for antigen and toxin.  Patient was also noted to be RSV positive.  After discussing with his daughter she states that he is at his new mental baseline which has been confused since he had a recent hip fracture.  Today was noted to have gross hematuria and so was referred into the emergency department for additional evaluation.  Per [facility] they noticed blood in bed and anterior briefs. With straight cath had 30 ml of gross hematuria. At mental baseline per SNF workers. Currently being treated for his c diff.        Home Medications Prior to Admission medications   Medication Sig Start Date End Date Taking? Authorizing Provider  acetaminophen (TYLENOL) 325 MG tablet Take 2 tablets (650 mg total) by mouth every 6 (six) hours as needed for mild pain, headache or fever. 10/17/22   Roxan Hockey, MD  amLODipine (NORVASC) 5 MG tablet Take 1 tablet (5 mg total) by mouth daily. 10/17/22   Roxan Hockey, MD  apixaban (ELIQUIS) 2.5 MG TABS tablet Take 1 tablet (2.5 mg total) by mouth 2 (two) times daily. 05/20/19   Josue Hector, MD  atorvastatin (LIPITOR) 80 MG tablet Take 1 tablet (80 mg total) by mouth daily. 05/02/19   Josue Hector, MD  cephALEXin (KEFLEX) 500 MG capsule Take 1 capsule (500 mg total) by mouth 3  (three) times daily for 5 days. 10/28/22 11/02/22  Wyvonnia Dusky, MD  clopidogrel (PLAVIX) 75 MG tablet Take 1 tablet by mouth once daily 03/31/20   Josue Hector, MD  digoxin (LANOXIN) 0.125 MG tablet Take 1 tablet (125 mcg total) by mouth daily. 05/02/19   Josue Hector, MD  escitalopram (LEXAPRO) 5 MG tablet Take 5 mg by mouth daily.    [provider]  folic acid (FOLVITE) 1 MG tablet Take 1 tablet (1 mg total) by mouth daily. 10/18/22   Roxan Hockey, MD  furosemide (LASIX) 20 MG tablet Take 1 tablet (20 mg total) by mouth daily. 10/17/22 10/17/23  Roxan Hockey, MD  LORazepam (ATIVAN) 1 MG tablet Take 1 tablet (1 mg total) by mouth 2 (two) times daily as needed for anxiety. 10/17/22 10/17/23  Roxan Hockey, MD  losartan (COZAAR) 25 MG tablet Take 1 tablet (25 mg total) by mouth daily. 10/18/22   Roxan Hockey, MD  methocarbamol (ROBAXIN) 500 MG tablet Take 1 tablet (500 mg total) by mouth 3 (three) times daily. 10/17/22   Roxan Hockey, MD  metoprolol succinate (TOPROL-XL) 50 MG 24 hr tablet Take 1 tablet (50 mg total) by mouth 2 (two) times daily. 10/17/22   Roxan Hockey, MD  multivitamin (RENA-VIT) TABS tablet Take 1 tablet by mouth at bedtime. 10/17/22   Roxan Hockey, MD  ondansetron (ZOFRAN) 4 MG tablet Take 1 tablet (  4 mg total) by mouth every 6 (six) hours as needed for nausea. 10/17/22   Roxan Hockey, MD  oxyCODONE (OXY IR/ROXICODONE) 5 MG immediate release tablet Take 1 tablet (5 mg total) by mouth every 8 (eight) hours as needed for moderate pain. 10/17/22   Roxan Hockey, MD  senna-docusate (SENOKOT-S) 8.6-50 MG tablet Take 2 tablets by mouth at bedtime. 10/17/22   Roxan Hockey, MD  tamsulosin (FLOMAX) 0.4 MG CAPS capsule Take 0.4 mg by mouth daily. 10/08/20   [provider]  thiamine (VITAMIN B-1) 100 MG tablet Take 1 tablet (100 mg total) by mouth daily. 10/18/22   Roxan Hockey, MD      Allergies    Patient has no known  allergies.    Review of Systems   Review of Systems  Physical Exam Updated Vital Signs BP (!) 111/92 (BP Location: Left Arm)   Pulse 67   Temp 97.6 F (36.4 C) (Axillary)   Resp (!) 25   Ht '5\' 8"'$  (1.727 m)   Wt 56 kg   SpO2 93%   BMI 18.77 kg/m  Physical Exam Vitals and nursing note reviewed.  Constitutional:      General: He is not in acute distress.    Appearance: He is well-developed.  HENT:     Head: Normocephalic and atraumatic.     Right Ear: External ear normal.     Left Ear: External ear normal.     Nose: Nose normal.  Eyes:     Extraocular Movements: Extraocular movements intact.     Conjunctiva/sclera: Conjunctivae normal.     Pupils: Pupils are equal, round, and reactive to light.  Cardiovascular:     Rate and Rhythm: Normal rate.  Pulmonary:     Effort: Pulmonary effort is normal. No respiratory distress.  Abdominal:     General: There is no distension.     Palpations: Abdomen is soft. There is no mass.     Tenderness: There is no abdominal tenderness. There is no guarding.     Comments: Grossly bloody urine noted in urinal.   Musculoskeletal:     Cervical back: Normal range of motion and neck supple.     Right lower leg: No edema.     Left lower leg: No edema.  Skin:    General: Skin is warm and dry.  Neurological:     Mental Status: He is alert. Mental status is at baseline.     Comments: Confused. Intermittently responding to questions  Psychiatric:        Mood and Affect: Mood normal.        Behavior: Behavior normal.     ED Results / Procedures / Treatments   Labs (all labs ordered are listed, but only abnormal results are displayed) Labs Reviewed  URINALYSIS, ROUTINE W REFLEX MICROSCOPIC - Abnormal; Notable for the following components:      Result Value   Color, Urine RED (*)    APPearance TURBID (*)    Glucose, UA   (*)    Value: TEST NOT REPORTED DUE TO COLOR INTERFERENCE OF URINE PIGMENT   Hgb urine dipstick   (*)    Value: TEST  NOT REPORTED DUE TO COLOR INTERFERENCE OF URINE PIGMENT   Bilirubin Urine   (*)    Value: TEST NOT REPORTED DUE TO COLOR INTERFERENCE OF URINE PIGMENT   Ketones, ur   (*)    Value: TEST NOT REPORTED DUE TO COLOR INTERFERENCE OF URINE PIGMENT   Protein, ur   (*)  Value: TEST NOT REPORTED DUE TO COLOR INTERFERENCE OF URINE PIGMENT   Nitrite   (*)    Value: TEST NOT REPORTED DUE TO COLOR INTERFERENCE OF URINE PIGMENT   Leukocytes,Ua   (*)    Value: TEST NOT REPORTED DUE TO COLOR INTERFERENCE OF URINE PIGMENT   All other components within normal limits  BASIC METABOLIC PANEL - Abnormal; Notable for the following components:   Chloride 117 (*)    CO2 21 (*)    Glucose, Bld 103 (*)    BUN 24 (*)    Creatinine, Ser 1.89 (*)    Calcium 8.2 (*)    GFR, Estimated 36 (*)    Anion gap 3 (*)    All other components within normal limits  CBC - Abnormal; Notable for the following components:   RBC 2.78 (*)    Hemoglobin 9.1 (*)    HCT 28.8 (*)    MCV 103.6 (*)    All other components within normal limits  URINALYSIS, MICROSCOPIC (REFLEX) - Abnormal; Notable for the following components:   Bacteria, UA FEW (*)    All other components within normal limits  URINE CULTURE    EKG None  Radiology CT Head Wo Contrast  Result Date: 10/27/2022 CLINICAL DATA:  Mental status change, unknown cause EXAM: CT HEAD WITHOUT CONTRAST TECHNIQUE: Contiguous axial images were obtained from the base of the skull through the vertex without intravenous contrast. RADIATION DOSE REDUCTION: This exam was performed according to the departmental dose-optimization program which includes automated exposure control, adjustment of the mA and/or kV according to patient size and/or use of iterative reconstruction technique. COMPARISON:  10/12/2022 FINDINGS: Brain: No evidence of acute infarction, hemorrhage, hydrocephalus or, extra-axial collection. Stable calcified lesion in the fourth ventricle measuring up to 1.8 cm.  Chronic lacunar infarcts in the bilateral basal ganglia. Patchy low-density changes within the periventricular and subcortical white matter compatible with chronic microvascular ischemic change. Moderate diffuse cerebral volume loss. Vascular: Atherosclerotic calcifications involving the large vessels of the skull base. No unexpected hyperdense vessel. Skull: Normal. Negative for fracture or focal lesion. Sinuses/Orbits: Mild diffuse paranasal sinus mucosal thickening with air-fluid levels in the sphenoid sinuses. Partial bilateral mastoid effusions. Other: None. IMPRESSION: 1. No acute intracranial abnormality. 2. 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 No hydrocephalus. 3. Chronic microvascular ischemic change and cerebral volume loss. 4. Paranasal sinus disease with air-fluid levels in the sphenoid sinuses. Correlate for acute sinusitis. 5. Partial bilateral mastoid effusions. Electronically Signed   By: Davina Poke D.O.   On: 10/27/2022 22:00    Procedures Procedures   Medications Ordered in ED Medications  sodium chloride irrigation 0.9 % 3,000 mL (3,000 mLs Irrigation New Bag/Given 10/29/22 1520)  metoprolol succinate (TOPROL-XL) 24 hr tablet 50 mg (has no administration in time range)  cefTRIAXone (ROCEPHIN) 1 g in sodium chloride 0.9 % 100 mL IVPB (0 g Intravenous Stopped 10/29/22 1727)  sodium chloride 0.9 % bolus 500 mL (0 mLs Intravenous Stopped 10/29/22 1700)  HYDROmorphone (DILAUDID) injection 0.25 mg (0.25 mg Intravenous Given 10/29/22 1726)    ED Course/ Medical Decision Making/ A&P Clinical Course as of 10/29/22 2035  Sat Oct 29, 2022  1536 Hemoglobin(!): 9.1 At baseline. [RP]  9629 Dr Louis Meckel from urology consulted. Recommends having pt fu with urology [facility] urology clinic and leaving the 3 way foley in place. Remove foley within next 48 hours at SNF. Hold Eliquis for 5 days.  [RP]  5284 Pt w/ elevated HR  w/ Afib in 90s-110s. Takes metoprolol XL 50 mg BID.  Will give dose now.  [HN]    Clinical Course User Index [HN] Audley Hose, MD [RP] Fransico Meadow, MD                           Medical Decision Making Amount and/or Complexity of Data Reviewed Labs: ordered. Decision-making details documented in ED Course.  Risk Prescription drug management.   Ricardo Garrett is a 76 y.o. male with comorbidities that complicate the patient evaluation including prior alcohol abuse, CHF, CAD on Plavix, CKD, COPD, dementia, atrial fibrillation on Eliquis who presents emergency department with hematuria.    Initial Ddx:  Hematuria, urinary retention, AKI, anemia, UTI  MDM:  Unclear what is causing the patient's hematuria.  May have had a recent traumatic Foley Which has been exacerbated by the fact that he is on Eliquis.  Bladder scan showed 275 mL of urine.  Will place three-way Foley and perform irrigation.  Also check labs for AKI and anemia.  Patient currently being treated with Keflex for possible UTI.  Will also give dose of ceftriaxone here in the emergency department.  Appears to be at his mental baseline at this time.  Plan:  Labs Urinalysis Three-way Foley with irrigation  ED Summary/Re-evaluation:  Patient reassessed in the emergency department was stable.  Underwent manual irrigation of his Foley which was followed by continuous bladder irrigation with improving color of his urine.  Renal function appears stable today when compared to visit several days ago.  Did give him a small bolus and she may have a mild AKI.  Consulted urology who recommended leaving the Foley catheter in place for 2 days and discontinuing his Eliquis for 5 days.  Will have his facility remove his Foley catheter in 2 days.  Will also have him follow-up with urology and his primary care doctor.  Signed out to oncoming physician awaiting reevaluation after his CBI finishes.   This patient presents to the ED for concern of complaints listed in HPI, this involves an  extensive number of treatment options, and is a complaint that carries with it a high risk of complications and morbidity. Disposition including potential need for admission considered.   Dispo: Pending remainder of workup  Additional history obtained from daughter Records reviewed ED Visit Notes The following labs were independently interpreted: Chemistry and show CKD I personally reviewed and interpreted cardiac monitoring: atrial fibrillation (normal rate)  I have reviewed the patients home medications and made adjustments as needed Consults: Urology Social Determinants of health:  SNF resident  Final Clinical Impression(s) / ED Diagnoses Final diagnoses:  Hematuria, unspecified type    Rx / DC Orders ED Discharge Orders     None         Fransico Meadow, MD 10/29/22 2035

## 2022-10-29 NOTE — ED Notes (Signed)
Attempting to obtain accurate BP and SPO2, intermittently inaccurate d/t restlessness/ tremor/ fidgety.

## 2022-10-29 NOTE — Discharge Instructions (Addendum)
You were seen for your bloody urine in the emergency department.  You had a Foley catheter placed.  Please have it removed on 11/01/2021 by the nurses at your facility.  Continue the Keflex that you were prescribed.  Please hold your Eliquis until 11/03/2021 to ensure that you do not have recurrent bleeding.  Follow-up with your primary doctor in 2-3 days regarding your visit.  Follow-up with your urologist in 1 week.  Return immediately to the emergency department if you experience any of the following: Urinary retention, fevers, or any other concerning symptoms.    Thank you for visiting our Emergency Department. It was a pleasure taking care of you today.

## 2022-10-29 NOTE — ED Notes (Signed)
CBI started. Medium to low flow for pinkish urine noted.

## 2022-10-29 NOTE — ED Triage Notes (Signed)
Pt brought in by [facility] from [facility] c/o 30 cc of blood in urine today.

## 2022-10-29 NOTE — ED Provider Notes (Signed)
5:05 PM Assumed care of patient from off-going team. For more details, please see note from same day.  In brief, this is a 76 y.o. male at SNF who p/w urinary retention and SNF catheterized him w/ gross hematuria. Currently doing CBI. Got dose of ceftriaxone prophylactically. Urology recommended leaving 3 way foley in place, holding eliquis for 5 days, and removing foley w/in 48 hours at SNF.  Should be taking keflex for 5 days prescribed yesterday. At mental status baseline.  Plan/Dispo at time of sign-out & ED Course since sign-out: '[ ]'$  reassess and make sure urine clears up   BP (!) 139/124   Pulse (!) 120   Temp (!) 96.9 F (36.1 C) (Rectal)   Resp (!) 23   Ht '5\' 8"'$  (1.727 m)   Wt 56 kg   SpO2 94%   BMI 18.77 kg/m    ED Course:   Clinical Course as of 10/29/22 2339  Sat Oct 29, 2022  1536 Hemoglobin(!): 9.1 At baseline. [RP]  3474 Dr Louis Meckel from urology consulted. Recommends having pt fu with urology [facility] urology clinic and leaving the 3 way foley in place. Remove foley within next 48 hours at SNF. Hold Eliquis for 5 days.  [RP]  2595 Pt w/ elevated HR w/ Afib in 90s-110s. Takes metoprolol XL 50 mg BID. Will give dose now.  [HN]  2204 CBI had been stopped at shift change, restarted at approximately 1930. Still draining frank blood with clots. Continuing CBI. Daughter at bedside states patient is acting mostly at his mental baseline. HR decreased with toprol. [HN]  2205 Hemoglobin(!): 9.1 Stable hgb [HN]  2206 Messaging urology [HN]  2213 D/w urology. CBI can't be turned off without urine becoming thick and frankly blood. Clots are still visible in the tubing now after several hours of CBI. Requesting admission to [facility]. [HN]    Clinical Course User Index [HN] Audley Hose, MD [RP] Fransico Meadow, MD    Dispo: Admit to medicine at [facility] ------------------------------- Cindee Lame, MD Emergency Medicine  This note was created using dictation software, which  may contain spelling or grammatical errors.   Audley Hose, MD 10/29/22 [phone]

## 2022-10-29 NOTE — ED Notes (Signed)
Pt from [facility] for rehab after fx of right hip. Per daughter in room pt has been jittery and confused over a week now and thinks may be due to pain meds given and his undiagnosed dementia. Only new thing today was when SNF catherized him for urine due to urinary retention, bright red blood has been coming out of penis since. Pt urinated approx 10 cc of medium red blood in urinal. Diaper pt had on had blood in it. Pt has bruising to right hip and arms. Pt is able to be consoled some by daughter. Pt confused and reaches for things at times.

## 2022-10-29 NOTE — Assessment & Plan Note (Signed)
URI symptoms, no O2 requirement at this time though.

## 2022-10-29 NOTE — ED Notes (Signed)
Restless, family at [facility], VSS, CBI continues, urine pink lemonade color. IVF and rocephin infusing.

## 2022-10-30 ENCOUNTER — Encounter (HOSPITAL_COMMUNITY): Payer: Self-pay | Admitting: Internal Medicine

## 2022-10-30 DIAGNOSIS — S72114G Nondisplaced fracture of greater trochanter of right femur, subsequent encounter for closed fracture with delayed healing: Secondary | ICD-10-CM

## 2022-10-30 DIAGNOSIS — I2510 Atherosclerotic heart disease of native coronary artery without angina pectoris: Secondary | ICD-10-CM | POA: Diagnosis not present

## 2022-10-30 DIAGNOSIS — R627 Adult failure to thrive: Secondary | ICD-10-CM | POA: Diagnosis not present

## 2022-10-30 DIAGNOSIS — R451 Restlessness and agitation: Secondary | ICD-10-CM | POA: Diagnosis not present

## 2022-10-30 DIAGNOSIS — R0600 Dyspnea, unspecified: Secondary | ICD-10-CM | POA: Diagnosis not present

## 2022-10-30 DIAGNOSIS — F1010 Alcohol abuse, uncomplicated: Secondary | ICD-10-CM | POA: Diagnosis not present

## 2022-10-30 DIAGNOSIS — N368 Other specified disorders of urethra: Secondary | ICD-10-CM | POA: Diagnosis not present

## 2022-10-30 DIAGNOSIS — N1832 Chronic kidney disease, stage 3b: Secondary | ICD-10-CM | POA: Diagnosis not present

## 2022-10-30 DIAGNOSIS — R310 Gross hematuria: Secondary | ICD-10-CM | POA: Diagnosis not present

## 2022-10-30 DIAGNOSIS — Z7189 Other specified counseling: Secondary | ICD-10-CM | POA: Diagnosis not present

## 2022-10-30 DIAGNOSIS — E7800 Pure hypercholesterolemia, unspecified: Secondary | ICD-10-CM | POA: Diagnosis not present

## 2022-10-30 DIAGNOSIS — I10 Essential (primary) hypertension: Secondary | ICD-10-CM | POA: Diagnosis not present

## 2022-10-30 DIAGNOSIS — F0394 Unspecified dementia, unspecified severity, with anxiety: Secondary | ICD-10-CM | POA: Diagnosis not present

## 2022-10-30 DIAGNOSIS — B974 Respiratory syncytial virus as the cause of diseases classified elsewhere: Secondary | ICD-10-CM | POA: Diagnosis not present

## 2022-10-30 DIAGNOSIS — N183 Chronic kidney disease, stage 3 unspecified: Secondary | ICD-10-CM

## 2022-10-30 DIAGNOSIS — Z20828 Contact with and (suspected) exposure to other viral communicable diseases: Secondary | ICD-10-CM | POA: Diagnosis not present

## 2022-10-30 DIAGNOSIS — Z79899 Other long term (current) drug therapy: Secondary | ICD-10-CM | POA: Diagnosis not present

## 2022-10-30 DIAGNOSIS — Z515 Encounter for palliative care: Secondary | ICD-10-CM | POA: Diagnosis not present

## 2022-10-30 DIAGNOSIS — R54 Age-related physical debility: Secondary | ICD-10-CM | POA: Diagnosis not present

## 2022-10-30 DIAGNOSIS — Z66 Do not resuscitate: Secondary | ICD-10-CM | POA: Diagnosis not present

## 2022-10-30 DIAGNOSIS — I130 Hypertensive heart and chronic kidney disease with heart failure and stage 1 through stage 4 chronic kidney disease, or unspecified chronic kidney disease: Secondary | ICD-10-CM | POA: Diagnosis not present

## 2022-10-30 DIAGNOSIS — I480 Paroxysmal atrial fibrillation: Secondary | ICD-10-CM

## 2022-10-30 DIAGNOSIS — X58XXXD Exposure to other specified factors, subsequent encounter: Secondary | ICD-10-CM | POA: Diagnosis not present

## 2022-10-30 DIAGNOSIS — R410 Disorientation, unspecified: Secondary | ICD-10-CM | POA: Diagnosis not present

## 2022-10-30 DIAGNOSIS — A0472 Enterocolitis due to Clostridium difficile, not specified as recurrent: Secondary | ICD-10-CM | POA: Diagnosis not present

## 2022-10-30 DIAGNOSIS — R634 Abnormal weight loss: Secondary | ICD-10-CM | POA: Diagnosis not present

## 2022-10-30 DIAGNOSIS — F13239 Sedative, hypnotic or anxiolytic dependence with withdrawal, unspecified: Secondary | ICD-10-CM | POA: Diagnosis not present

## 2022-10-30 DIAGNOSIS — I5042 Chronic combined systolic (congestive) and diastolic (congestive) heart failure: Secondary | ICD-10-CM

## 2022-10-30 DIAGNOSIS — R52 Pain, unspecified: Secondary | ICD-10-CM | POA: Diagnosis not present

## 2022-10-30 DIAGNOSIS — E46 Unspecified protein-calorie malnutrition: Secondary | ICD-10-CM | POA: Diagnosis not present

## 2022-10-30 DIAGNOSIS — Z1152 Encounter for screening for COVID-19: Secondary | ICD-10-CM | POA: Diagnosis not present

## 2022-10-30 DIAGNOSIS — Z8249 Family history of ischemic heart disease and other diseases of the circulatory system: Secondary | ICD-10-CM | POA: Diagnosis not present

## 2022-10-30 DIAGNOSIS — N179 Acute kidney failure, unspecified: Secondary | ICD-10-CM | POA: Diagnosis not present

## 2022-10-30 DIAGNOSIS — R319 Hematuria, unspecified: Secondary | ICD-10-CM | POA: Diagnosis not present

## 2022-10-30 DIAGNOSIS — I4819 Other persistent atrial fibrillation: Secondary | ICD-10-CM | POA: Diagnosis not present

## 2022-10-30 DIAGNOSIS — Z955 Presence of coronary angioplasty implant and graft: Secondary | ICD-10-CM | POA: Diagnosis not present

## 2022-10-30 DIAGNOSIS — Z681 Body mass index (BMI) 19 or less, adult: Secondary | ICD-10-CM | POA: Diagnosis not present

## 2022-10-30 DIAGNOSIS — Z87891 Personal history of nicotine dependence: Secondary | ICD-10-CM | POA: Diagnosis not present

## 2022-10-30 DIAGNOSIS — J449 Chronic obstructive pulmonary disease, unspecified: Secondary | ICD-10-CM | POA: Diagnosis not present

## 2022-10-30 LAB — BASIC METABOLIC PANEL
Anion gap: 6 (ref 5–15)
BUN: 26 mg/dL — ABNORMAL HIGH (ref 8–23)
CO2: 17 mmol/L — ABNORMAL LOW (ref 22–32)
Calcium: 7.9 mg/dL — ABNORMAL LOW (ref 8.9–10.3)
Chloride: 116 mmol/L — ABNORMAL HIGH (ref 98–111)
Creatinine, Ser: 1.96 mg/dL — ABNORMAL HIGH (ref 0.61–1.24)
GFR, Estimated: 34 mL/min — ABNORMAL LOW (ref >60)
Glucose, Bld: 85 mg/dL (ref 70–99)
Potassium: 4.2 mmol/L (ref 3.5–5.1)
Sodium: 139 mmol/L (ref 135–145)

## 2022-10-30 LAB — CBC
HCT: 27.6 % — ABNORMAL LOW (ref 39.0–52.0)
Hemoglobin: 8.7 g/dL — ABNORMAL LOW (ref 13.0–17.0)
MCH: 33.1 pg (ref 26.0–34.0)
MCHC: 31.5 g/dL (ref 30.0–36.0)
MCV: 104.9 fL — ABNORMAL HIGH (ref 80.0–100.0)
Platelets: 298 10*3/uL (ref 150–400)
RBC: 2.63 MIL/uL — ABNORMAL LOW (ref 4.22–5.81)
RDW: 14.6 % (ref 11.5–15.5)
WBC: 13.7 10*3/uL — ABNORMAL HIGH (ref 4.0–10.5)
nRBC: 0 % (ref 0.0–0.2)

## 2022-10-30 LAB — ETHANOL: Alcohol, Ethyl (B): <10 mg/dL (ref <10)

## 2022-10-30 MED ORDER — METOPROLOL SUCCINATE ER 50 MG PO TB24
50.0000 mg | ORAL_TABLET | Freq: Two times a day (BID) | ORAL | Status: DC
  Filled 2022-10-30: qty 1

## 2022-10-30 MED ORDER — VANCOMYCIN HCL 125 MG PO CAPS
125.0000 mg | ORAL_CAPSULE | Freq: Four times a day (QID) | ORAL | Status: DC
  Filled 2022-10-30 (×7): qty 1

## 2022-10-30 MED ORDER — LACTATED RINGERS IV SOLN: INTRAVENOUS | Status: DC

## 2022-10-30 MED ORDER — LORAZEPAM 1 MG PO TABS: 1.0000 mg | ORAL_TABLET | ORAL | Status: DC | PRN

## 2022-10-30 MED ORDER — THIAMINE HCL 100 MG/ML IJ SOLN
100.0000 mg | Freq: Every day | INTRAMUSCULAR | Status: DC
  Administered 2022-10-30 – 2022-10-31 (×2): 100 mg via INTRAVENOUS
  Filled 2022-10-30 (×2): qty 2

## 2022-10-30 MED ORDER — VANCOMYCIN 50 MG/ML ORAL SOLUTION
125.0000 mg | Freq: Four times a day (QID) | ORAL | Status: DC
  Filled 2022-10-30 (×7): qty 2.5

## 2022-10-30 MED ORDER — LORAZEPAM 2 MG/ML IJ SOLN
1.0000 mg | INTRAMUSCULAR | Status: DC | PRN
  Administered 2022-10-30: 2 mg via INTRAVENOUS
  Filled 2022-10-30 (×2): qty 1

## 2022-10-30 MED ORDER — THIAMINE MONONITRATE 100 MG PO TABS: 100.0000 mg | ORAL_TABLET | Freq: Every day | ORAL | Status: DC

## 2022-10-30 MED ORDER — DIGOXIN 125 MCG PO TABS
125.0000 ug | ORAL_TABLET | Freq: Every day | ORAL | Status: DC
  Filled 2022-10-30: qty 1

## 2022-10-30 NOTE — Assessment & Plan Note (Signed)
Pt with ongoing c.diff diarrhea.  Being treated in SNF, though I dont see with what on med rec.  (And not taking POs well anyhow at the moment per daughter) Putting pt on PO vanc as per pathway.

## 2022-10-30 NOTE — Assessment & Plan Note (Addendum)
Pt with persistent delirium since hip fx admission earlier this month, superimposed on some degree of dementia. Doing poorly overall in SNF it sounds like. Trying to treat medical issues as best able (RSV, C.diff, hematuria, pain from fracture). However, daughter is correct to worry about prognosis with continued delirium, poor PO intake, etc. She is clear though: pt is DNR And no feeding tube Daughter wants to have discussion with pal care to see what home hospice might have to offer Put in consult into Epic for pal care with daughters number to discuss. Small dose Ativan IV PRN only for anxiety (dont want him to go into benzo withdrawal) Benzos and Opiates also not helping no doubt, but dont want benzo withdrawal, and dont want him to be in pain from the hip.  (Discussed this with daughter as well).

## 2022-10-30 NOTE — Assessment & Plan Note (Addendum)
Med rec pending at this time. Holding BP meds given soft BPs in ED

## 2022-10-30 NOTE — H&P (Signed)
History and Physical    Patient: Ricardo Garrett FMB:[id number] DOB: 05-28-1946 DOA: 10/29/2022 DOS: the patient was seen and examined on 10/30/2022 PCP: [name], MD  Patient coming from: SNF  Chief Complaint:  Chief Complaint  Patient presents with   Hematuria   HPI: Ricardo Garrett is a 76 y.o. male with medical history significant of prior EtOH abuse, CHF, CAD on plavix, a.fib on eliquis, COPD, dementia.  Pt admitted for fall and greater trochanter fx of R femur earlier this month.  Non-op treatment.  Sent to SNF following admit.  Since getting to SNF he has really done quite poorly with persistent delirium, poor PO intake.  2 days ago, seen in ED with hematuria and confusion.  Was found to have mild AKI and was given ceftriaxone for possible UTI. Urine culture resulted as negative. Was also having diarrhea also had a C. difficile that was sent which was positive for antigen and toxin. Patient was also noted to be RSV positive.  CT head neg for acute findings.  Today was noted to have gross hematuria and so was referred into the emergency department for additional evaluation.  Pt reportedly on treatment for C.Diff per SNF.   Review of Systems: As mentioned in the history of present illness. All other systems reviewed and are negative. 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 12/2015: echo showing reduced EF of 35-40% b. NST: 09/2017: scar with peri-infarct ischemia, intermediate-risk study   Chronic kidney disease, stage 3 unspecified ([place]) 01/19/2016   COPD (chronic obstructive pulmonary disease) ([place])    "was on RX when he had CHF; not on anything  now" (11/23/2017)   Coronary artery disease involving native coronary artery of native heart with unstable angina pectoris (HCC)    High cholesterol    HOH (hard of hearing)    Hypertension    Hypoalbuminemia 01/19/2016   Mixed anxiety and depressive disorder 07/06/2022   Monoclonal gammopathy 01/14/2020   Persistent atrial fibrillation ([place])    a. s/p DCCV in 03/2016   Tachycardia-bradycardia syndrome [facility])    Past Surgical History:  Procedure Laterality Date   CARDIAC CATHETERIZATION  11/23/2017   CARDIOVERSION N/A 04/05/2016   Procedure: CARDIOVERSION;  Surgeon: Josue Hector, MD;  Location: [facility] ENDO [facility];  Service: Cardiovascular;  Laterality: N/A;   COMPRESSION HIP SCREW Right 03/15/2013   Procedure: COMPRESSION HIP;  Surgeon: Sanjuana Kava, MD;  Location: [facility] ORS;  Service: Orthopedics;  Laterality: Right;   CORONARY ATHERECTOMY N/A 11/24/2017   Procedure: CORONARY ATHERECTOMY;  Surgeon: Burnell Blanks, MD;  Location: [place] CV LAB;  Service: Cardiovascular;  Laterality: N/A;   CORONARY BALLOON ANGIOPLASTY N/A 11/24/2017   Procedure: CORONARY BALLOON ANGIOPLASTY;  Surgeon: Burnell Blanks, MD;  Location: [place] CV LAB;  Service: Cardiovascular;  Laterality: N/A;   CORONARY STENT INTERVENTION N/A 11/24/2017   Procedure: CORONARY STENT INTERVENTION;  Surgeon: Burnell Blanks, MD;  Location: [place] CV LAB;  Service: Cardiovascular;  Laterality: N/A;   FRACTURE SURGERY     PACEMAKER IMPLANT N/A 11/27/2017   Procedure: PACEMAKER IMPLANT;  Surgeon: Evans Lance, MD;  Location: [place] CV LAB;  Service: Cardiovascular;  Laterality: N/A;   RIGHT/LEFT HEART CATH AND CORONARY ANGIOGRAPHY N/A 11/23/2017   Procedure: RIGHT/LEFT HEART CATH AND CORONARY ANGIOGRAPHY;  Surgeon: Burnell Blanks, MD;  Location: [place] CV LAB;  Service: Cardiovascular;  Laterality: N/A;   SKIN GRAFT Left    from right thigh   TONSILLECTOMY     Social History:   reports that he quit smoking about 43 years ago. His smoking use included cigarettes. He has a 20.00 pack-year smoking history. He has never used smokeless tobacco. He reports current alcohol use. He reports that he does not currently use drugs after having used the following drugs: Marijuana.  No Known Allergies  Family History  Problem Relation Age of Onset   Diabetes Mother    Heart attack Father     Prior to Admission medications   Medication Sig Start Date End Date Taking? Authorizing Provider  acetaminophen (TYLENOL) 325 MG tablet Take 2 tablets (650 mg total) by mouth every 6 (six) hours as needed for mild pain, headache or fever. 10/17/22   Roxan Hockey, MD  amLODipine (NORVASC) 5 MG tablet Take 1 tablet (5 mg total) by mouth daily. 10/17/22   Roxan Hockey, MD  apixaban (ELIQUIS) 2.5 MG TABS tablet Take 1 tablet (2.5 mg total) by mouth 2 (two) times daily. 05/20/19   Josue Hector, MD  atorvastatin (LIPITOR) 80 MG tablet Take 1 tablet (80 mg total) by mouth daily. 05/02/19   Josue Hector, MD  cephALEXin (KEFLEX) 500 MG capsule Take 1 capsule (500 mg total) by mouth 3 (three) times daily for 5 days. 10/28/22 11/02/22  Wyvonnia Dusky, MD  clopidogrel (PLAVIX) 75 MG tablet Take 1 tablet by mouth once daily 03/31/20   Josue Hector, MD  digoxin (LANOXIN) 0.125 MG tablet Take 1 tablet (125 mcg total) by mouth daily. 05/02/19   Josue Hector, MD  escitalopram (LEXAPRO) 5 MG tablet Take 5 mg by mouth daily.    [provider]  folic acid (FOLVITE) 1 MG tablet Take 1 tablet (1 mg total) by mouth daily. 10/18/22   Roxan Hockey, MD  furosemide (LASIX) 20 MG tablet Take 1 tablet (20 mg total) by mouth daily. 10/17/22 10/17/23  Roxan Hockey, MD  LORazepam (ATIVAN) 1 MG tablet Take 1 tablet (1 mg total) by mouth 2 (two) times daily as needed for anxiety. 10/17/22 10/17/23  Roxan Hockey, MD  losartan (COZAAR) 25 MG tablet Take 1 tablet (25 mg total) by mouth daily.  10/18/22   Roxan Hockey, MD  methocarbamol (ROBAXIN) 500 MG tablet Take 1 tablet (500 mg total) by mouth 3 (three) times daily. 10/17/22   Roxan Hockey, MD  metoprolol succinate (TOPROL-XL) 50 MG 24 hr tablet Take 1 tablet (50 mg total) by mouth 2 (two) times daily. 10/17/22   Roxan Hockey, MD  multivitamin (RENA-VIT) TABS tablet Take 1 tablet by mouth at bedtime. 10/17/22   Roxan Hockey, MD  ondansetron (ZOFRAN) 4 MG tablet Take 1 tablet (4 mg total) by mouth every 6 (six) hours as needed for nausea. 10/17/22   Roxan Hockey, MD  oxyCODONE (OXY IR/ROXICODONE) 5 MG immediate release tablet Take 1 tablet (5 mg total) by mouth every 8 (eight) hours as needed for moderate pain. 10/17/22   Roxan Hockey, MD  senna-docusate (SENOKOT-S) 8.6-50 MG tablet Take 2 tablets by mouth at bedtime. 10/17/22   Roxan Hockey, MD  tamsulosin (FLOMAX) 0.4  MG CAPS capsule Take 0.4 mg by mouth daily. 10/08/20   [provider]  thiamine (VITAMIN B-1) 100 MG tablet Take 1 tablet (100 mg total) by mouth daily. 10/18/22   Roxan Hockey, MD    Physical Exam: Vitals:   10/29/22 1900 10/29/22 2009 10/29/22 2130 10/29/22 2250  BP: 126/76 (!) 111/92 102/61   Pulse:  67 78   Resp: 20 (!) 25 (!) 22   Temp:  97.6 F (36.4 C)    TempSrc:  Axillary    SpO2: 95% 93% 93% 93%  Weight:      Height:       Constitutional: Confused, gross hematuria in catheter Eyes: PERRL, lids and conjunctivae normal ENMT: Mucous membranes are moist. Posterior pharynx clear of any exudate or lesions.Normal dentition.  Neck: normal, supple, no masses, no thyromegaly Respiratory: clear to auscultation bilaterally, no wheezing, no crackles. Normal respiratory effort. No accessory muscle use.  Cardiovascular: Regular rate and rhythm, no murmurs / rubs / gallops. No extremity edema. 2+ pedal pulses. No carotid bruits.  Abdomen: no tenderness, no masses palpated. No hepatosplenomegaly. Bowel sounds positive.   Musculoskeletal: no clubbing / cyanosis. No joint deformity upper and lower extremities. Good ROM, no contractures. Normal muscle tone.  Skin: no rashes, lesions, ulcers. No induration Neurologic: MAE, confused, intermittently responding to questions. Psychiatric: Confused  Data Reviewed:    CBC    Component Value Date/Time   WBC 6.7 10/29/2022 1457   RBC 2.78 (L) 10/29/2022 1457   HGB 9.1 (L) 10/29/2022 1457   HCT 28.8 (L) 10/29/2022 1457   HCT 48 02/12/2016 1100   PLT 307 10/29/2022 1457   MCV 103.6 (H) 10/29/2022 1457   MCV 89.2 02/12/2016 1100   MCH 32.7 10/29/2022 1457   MCHC 31.6 10/29/2022 1457   RDW 14.3 10/29/2022 1457   RDW 15.1 02/12/2016 1100   LYMPHSABS 0.8 10/27/2022 2110   MONOABS 0.7 10/27/2022 2110   EOSABS 0.0 10/27/2022 2110   BASOSABS 0.0 10/27/2022 2110      Latest Ref Rng & Units 10/29/2022    2:57 PM 10/27/2022    9:10 PM 10/17/2022    4:55 AM  CMP  Glucose 70 - 99 mg/dL 103  99  104   BUN 8 - 23 mg/dL '24  23  31   '$ Creatinine 0.61 - 1.24 mg/dL 1.89  1.94  1.42   Sodium 135 - 145 mmol/L 141  139  140   Potassium 3.5 - 5.1 mmol/L 4.3  3.9  3.8   Chloride 98 - 111 mmol/L 117  114  112   CO2 22 - 32 mmol/L '21  20  21   '$ Calcium 8.9 - 10.3 mg/dL 8.2  7.6  8.3   Total Protein 6.5 - 8.1 g/dL  5.8    Total Bilirubin 0.3 - 1.2 mg/dL  0.5    Alkaline Phos 38 - 126 U/L  117    AST 15 - 41 U/L  41    ALT 0 - 44 U/L  29     Urinalysis    Component Value Date/Time   COLORURINE RED (A) 10/29/2022 1518   APPEARANCEUR TURBID (A) 10/29/2022 1518   LABSPEC  10/29/2022 1518    TEST NOT REPORTED DUE TO COLOR INTERFERENCE OF URINE PIGMENT   PHURINE  10/29/2022 1518    TEST NOT REPORTED DUE TO COLOR INTERFERENCE OF URINE PIGMENT   GLUCOSEU (A) 10/29/2022 1518    TEST NOT REPORTED DUE TO COLOR INTERFERENCE OF  URINE PIGMENT   HGBUR (A) 10/29/2022 1518    TEST NOT REPORTED DUE TO COLOR INTERFERENCE OF URINE PIGMENT   BILIRUBINUR (A) 10/29/2022 1518    TEST  NOT REPORTED DUE TO COLOR INTERFERENCE OF URINE PIGMENT   KETONESUR (A) 10/29/2022 1518    TEST NOT REPORTED DUE TO COLOR INTERFERENCE OF URINE PIGMENT   PROTEINUR (A) 10/29/2022 1518    TEST NOT REPORTED DUE TO COLOR INTERFERENCE OF URINE PIGMENT   UROBILINOGEN 0.2 03/14/2013 1552   NITRITE (A) 10/29/2022 1518    TEST NOT REPORTED DUE TO COLOR INTERFERENCE OF URINE PIGMENT   LEUKOCYTESUR (A) 10/29/2022 1518    TEST NOT REPORTED DUE TO COLOR INTERFERENCE OF URINE PIGMENT    Assessment and Plan: * Gross hematuria Pt with persistent gross hematuria. On CBI HGB 9.1 essentially unchanged at this point Continue CBI Hold eliquis Repeat CBC in AM Pt getting transferred to [facility] Dr. Louis Meckel will see pt after he gets there, please call Dr. Louis Meckel on patient arrival.  Altered mental status Pt with persistent delirium since hip fx admission earlier this month, superimposed on some degree of dementia. Doing poorly overall in SNF it sounds like. Trying to treat medical issues as best able However, daughter is correct to worry about prognosis with continued delirium, poor PO intake, etc. She is clear though: pt is DNR And no feeding tube Daughter wants to have discussion with pal care to see what home hospice might have to offer Put in consult into Epic for pal care with daughters number to discuss. Small dose Ativan IV PRN only for anxiety (dont want him to go into benzo withdrawal) Benzos and Opiates also not helping no doubt, but dont want benzo withdrawal, and dont want him to be in pain from the hip.  (Discussed this with daughter as well).  C. difficile diarrhea Pt with ongoing c.diff diarrhea.  Being treated in SNF, though I dont see with what on med rec.  (And not taking POs well anyhow at the moment per daughter) Putting pt on PO vanc as per pathway.  RSV exposure URI symptoms, no O2 requirement at this time though.  Closed fracture of greater trochanter of right femur ([place]) Pt in  SNF following this earlier this month. WBAT with assistance. Morphine PRN for pain at the moment  CKD (chronic kidney disease) stage 3, GFR 30-59 ml/min (HCC) With superimposed mild AKI today. IVF CBI for hematuria Hold lasix Strict intake and output Repeat BMP in AM  Essential (primary) hypertension Med rec pending at this time. Holding BP meds given soft BPs in ED  Chronic combined systolic and diastolic heart failure (HCC) Hold BP meds and lasix due to soft BPs in ED  Paroxysmal atrial fibrillation ([place]) Hold eliquis due to gross hematuria. Cont metoprolol for rate control Cont digoxin      Advance Care Planning:   Code Status: DNR Confirmed with daughter  Consults: Dr. Louis Meckel  Family Communication: Daughter at bedside  Severity of Illness: The appropriate patient status for this patient is OBSERVATION. Observation status is judged to be reasonable and necessary in order to provide the required intensity of service to ensure the patient's safety. The patient's presenting symptoms, physical exam findings, and initial radiographic and laboratory data in the context of their medical condition is felt to place them at decreased risk for further clinical deterioration. Furthermore, it is anticipated that the patient will be medically stable for discharge from the hospital within 2 midnights of admission.  Author: Etta Quill., DO 10/30/2022 12:23 AM  For on call review [email or web address].

## 2022-10-30 NOTE — Progress Notes (Signed)
       CROSS COVER NOTE  NAME: Ricardo Garrett MRN: [id number] DOB : Mar 08, 1946    Date of Service   10/30/2022   HPI/Events of Note   Bedside RN expresses concern for an increase in patient's agitation, restlessness, fidgeting, and tremors.  Patient is at times combative and "swings" at staff.    History of alcohol use is noted.  CIWA of 16 was endorsed by RN.  Patient placed on CIWA protocol.   Interventions/ Plan   CIWA protocol-Ativan use       Raenette Rover, [place], [place]

## 2022-10-30 NOTE — Assessment & Plan Note (Addendum)
Hold eliquis due to gross hematuria. Cont metoprolol for rate control Cont digoxin

## 2022-10-30 NOTE — Consult Note (Signed)
I have been asked to see the patient by Dr. Jennette Kettle, for evaluation and management of gross hematuria.  History of present illness: 76 year old man with dementia who is currently residing in a nursing facility who presented to the emergency department with blood per urethra.  Evidently, this occurred several days prior and the patient was discharged back to the nursing facility.  However this morning when noted by the nursing staff to have blood dripping from the urethra and blood in the toilet he was brought to the [facility] emergency department.  The patient does take Eliquis for paroxysmal A-fib and a pacemaker.  He does not appear to be taking Xarelto any longer.  In the emergency department the providers placed a 16 French three-way Foley catheter.  He was given 4 hours of continuous bladder irrigation with no resolution.  Urology was called, and I recommended that he be transferred to [facility] for further evaluation and management.  The patient is unoriented and unable to respond to my questioning in any meaningful way.  As such, minimal history beyond the above was obtained.  Review of systems: A 12 point comprehensive review of systems was obtained and is negative unless otherwise stated in the history of present illness.  Patient Active Problem List   Diagnosis Date Noted   C. difficile diarrhea 10/30/2022   Gross hematuria 10/29/2022   RSV exposure 10/29/2022   Altered mental status 10/09/2022    Class: Acute   Closed fracture of greater trochanter of right femur ([place]) 10/07/2022   Alcohol use 10/07/2022   Hypomagnesemia 10/07/2022   Paroxysmal atrial fibrillation ([place]) 10/07/2022   Essential hypertension 10/07/2022   HLD (hyperlipidemia) 10/07/2022   Fall at home, initial encounter 10/07/2022   Anemia 09/28/2022   Mixed anxiety and depressive disorder 07/06/2022   Alcohol abuse 09/14/2021   Chronic combined systolic and diastolic heart failure ([place])  03/10/2021   Monoclonal gammopathy 01/14/2020   Coronary arteriosclerosis 10/08/2018   Unstable angina ([place]) 11/23/2017   Coronary artery disease involving native coronary artery of native heart with unstable angina pectoris ([place])    Essential (primary) hypertension 34/74/2595   Acute systolic CHF (congestive heart failure) ([place]) 63/87/5643   Acute systolic heart failure ([place]) 01/21/2016   Atrial fibrillation with rapid ventricular response ([place]) 01/20/2016   Anxiety disorder 01/19/2016   COPD (chronic obstructive pulmonary disease) ([place]) 01/19/2016   Anasarca 01/19/2016   CKD (chronic kidney disease) stage 3, GFR 30-59 ml/min ([place]) 01/19/2016   Chronic kidney disease, stage 3 unspecified ([place]) 01/19/2016   Difficulty in walking(719.7) 04/29/2013   Other and unspecified hyperlipidemia 03/19/2013   Anticoagulated 03/19/2013   Acute blood loss anemia 03/16/2013   Intertrochanteric fracture of right hip ([place]) 03/14/2013   Intertrochanteric fracture ([place]) 03/14/2013    No current facility-administered medications on file prior to encounter.   Current Outpatient Medications on File Prior to Encounter  Medication Sig Dispense Refill   acetaminophen (TYLENOL) 325 MG tablet Take 2 tablets (650 mg total) by mouth every 6 (six) hours as needed for mild pain, headache or fever. 100 tablet 0   amLODipine (NORVASC) 5 MG tablet Take 1 tablet (5 mg total) by mouth daily. 90 tablet 3   apixaban (ELIQUIS) 2.5 MG TABS tablet Take 1 tablet (2.5 mg total) by mouth 2 (two) times daily. 60 tablet 5   atorvastatin (LIPITOR) 80 MG tablet Take 1 tablet (80 mg total) by mouth daily. 90 tablet 3   cephALEXin (KEFLEX) 500 MG capsule  Take 1 capsule (500 mg total) by mouth 3 (three) times daily for 5 days. 15 capsule 0   clopidogrel (PLAVIX) 75 MG tablet Take 1 tablet by mouth once daily 90 tablet 3   digoxin (LANOXIN) 0.125 MG tablet Take 1 tablet (125 mcg total) by mouth daily. 90 tablet 3   escitalopram  (LEXAPRO) 5 MG tablet Take 5 mg by mouth daily.     folic acid (FOLVITE) 1 MG tablet Take 1 tablet (1 mg total) by mouth daily. 30 tablet 3   furosemide (LASIX) 20 MG tablet Take 1 tablet (20 mg total) by mouth daily. 30 tablet 11   LORazepam (ATIVAN) 1 MG tablet Take 1 tablet (1 mg total) by mouth 2 (two) times daily as needed for anxiety. 14 tablet 0   losartan (COZAAR) 25 MG tablet Take 1 tablet (25 mg total) by mouth daily. 90 tablet 3   methocarbamol (ROBAXIN) 500 MG tablet Take 1 tablet (500 mg total) by mouth 3 (three) times daily. 30 tablet 0   metoprolol succinate (TOPROL-XL) 50 MG 24 hr tablet Take 1 tablet (50 mg total) by mouth 2 (two) times daily. 60 tablet 3   multivitamin (RENA-VIT) TABS tablet Take 1 tablet by mouth at bedtime. 30 tablet 2   ondansetron (ZOFRAN) 4 MG tablet Take 1 tablet (4 mg total) by mouth every 6 (six) hours as needed for nausea. 20 tablet 0   oxyCODONE (OXY IR/ROXICODONE) 5 MG immediate release tablet Take 1 tablet (5 mg total) by mouth every 8 (eight) hours as needed for moderate pain. 30 tablet 0   senna-docusate (SENOKOT-S) 8.6-50 MG tablet Take 2 tablets by mouth at bedtime. 60 tablet 2   tamsulosin (FLOMAX) 0.4 MG CAPS capsule Take 0.4 mg by mouth daily.     thiamine (VITAMIN B-1) 100 MG tablet Take 1 tablet (100 mg total) by mouth daily. 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)    a. 12/2015: echo showing reduced EF of 35-40% b. NST: 09/2017: scar with peri-infarct ischemia, intermediate-risk study   Chronic kidney disease, stage 3 unspecified ([place]) 01/19/2016   COPD (chronic obstructive pulmonary disease) ([place])    "was  on RX when he had CHF; not on anything now" (11/23/2017)   Coronary artery disease involving native coronary artery of native heart with unstable angina pectoris (H[facility])    High cholesterol    HOH (hard of hearing)    Hypertension    Hypoalbuminemia 01/19/2016   Mixed anxiety and depressive disorder 07/06/2022   Monoclonal gammopathy 01/14/2020   Persistent atrial fibrillation ([place])    a. s/p DCCV in 03/2016   Tachycardia-bradycardia syndrome [facility])     Past Surgical History:  Procedure Laterality Date   CARDIAC CATHETERIZATION  11/23/2017   CARDIOVERSION N/A 04/05/2016   Procedure: CARDIOVERSION;  Surgeon: Josue Hector, MD;  Location[facility] ENDO [facility];  Service: Cardiovascular;  Laterality: N/A;   COMPRESSION HIP SCREW Right 03/15/2013   Procedure: COMPRESSION HIP;  Surgeon: Sanjuana Kava, MD;  Location: [facility] ORS;  Service: Orthopedics;  Laterality: Right;   CORONARY ATHERECTOMY N/A 11/24/2017  Procedure: CORONARY ATHERECTOMY;  Surgeon: Burnell Blanks, MD;  Location: [place] CV LAB;  Service: Cardiovascular;  Laterality: N/A;   CORONARY BALLOON ANGIOPLASTY N/A 11/24/2017   Procedure: CORONARY BALLOON ANGIOPLASTY;  Surgeon: Burnell Blanks, MD;  Location: [place] CV LAB;  Service: Cardiovascular;  Laterality: N/A;   CORONARY STENT INTERVENTION N/A 11/24/2017   Procedure: CORONARY STENT INTERVENTION;  Surgeon: Burnell Blanks, MD;  Location: [place] CV LAB;  Service: Cardiovascular;  Laterality: N/A;   FRACTURE SURGERY     PACEMAKER IMPLANT N/A 11/27/2017   Procedure: PACEMAKER IMPLANT;  Surgeon: Evans Lance, MD;  Location: [place] CV LAB;  Service: Cardiovascular;  Laterality: N/A;   RIGHT/LEFT HEART CATH AND CORONARY ANGIOGRAPHY N/A 11/23/2017   Procedure: RIGHT/LEFT HEART CATH AND CORONARY ANGIOGRAPHY;  Surgeon: Burnell Blanks, MD;  Location: [place] [facility] LAB;  Service: Cardiovascular;  Laterality: N/A;   SKIN GRAFT Left    from right thigh    TONSILLECTOMY      Social History   Tobacco Use   Smoking status: Former    Packs/day: 1.00    Years: 20.00    Total pack years: 20.00    Types: Cigarettes    Quit date: 11/01/1979    Years since quitting: 43.0   Smokeless tobacco: Never  Vaping Use   Vaping Use: Never used  Substance Use Topics   Alcohol use: Yes    Comment: 11/23/2017 "beer once/month maybe"   Drug use: Not Currently    Types: Marijuana    Comment: 11/23/2017 "nothing for years"    Family History  Problem Relation Age of Onset   Diabetes Mother    Heart attack Father     PE: Vitals:   10/29/22 2250 10/30/22 0030 10/30/22 0034 10/30/22 0200  BP:  121/67 121/67 133/86  Pulse:  94 (!) 137 100  Resp:  '18 18 18  '$ Temp:   (!) 96.9 F (36.1 C) 98.1 F (36.7 C)  TempSrc:   Axillary Axillary  SpO2: 93% 100% 90% 95%  Weight:      Height:       Patient appears to be in no acute distress  patient is alert but oriented x 0 Atraumatic normocephalic head No cervical or supraclavicular lymphadenopathy appreciated No increased work of breathing, no audible wheezes/rhonchi Regular sinus rhythm/rate Abdomen is soft, nontender, nondistended, no CVA or suprapubic tenderness 16 French three-way Foley catheter emanating from his penis Lower extremities are symmetric without appreciable edema Grossly neurologically intact No identifiable skin lesions  Recent Labs    10/27/22 2110 10/29/22 1457  WBC 6.3 6.7  HGB 9.4* 9.1*  HCT 29.4* 28.8*   Recent Labs    10/27/22 2110 10/29/22 1457  NA 139 141  K 3.9 4.3  CL 114* 117*  CO2 20* 21*  GLUCOSE 99 103*  BUN 23 24*  CREATININE 1.94* 1.89*  CALCIUM 7.6* 8.2*   No results for input(s): "LABPT", "INR" in the last 72 hours. No results for input(s): "LABURIN" in the last 72 hours. Results for orders placed or performed during the hospital encounter of 10/27/22  Urine Culture     Status: None   Collection Time: 10/27/22  8:31 PM   Specimen: Urine, Clean  Catch  Result Value Ref Range Status   Specimen Description   Final    URINE, CLEAN CATCH Performed at [facility], [address] [place], [place] [address]    Special Requests   Final  NONE Performed at [facility], [address], [place], [place] [address]    Culture   Final    NO GROWTH Performed at [facility], [place] [address], [place], [place] [address]    Report Status 10/29/2022 FINAL  Final  C Difficile Quick Screen w PCR reflex     Status: Abnormal   Collection Time: 10/27/22  8:35 PM   Specimen: Urine, Clean Catch; Stool  Result Value Ref Range Status   C Diff antigen POSITIVE (A) NEGATIVE Final   C Diff toxin POSITIVE (A) NEGATIVE Final   C Diff interpretation Toxin producing C. difficile detected.  Final    Comment: CRITICAL RESULT CALLED TO, READ BACK BY AND VERIFIED WITH: MOSTELLER,M. '@0116'$  ON 10/28/22 BY [facility] Performed at [facility], [address] [place],  [address]   Resp panel by RT-PCR (RSV, Flu A&B, Covid) Anterior Nasal Swab     Status: Abnormal   Collection Time: 10/27/22  9:00 PM   Specimen: Anterior Nasal Swab  Result Value Ref Range Status   SARS Coronavirus 2 by RT PCR NEGATIVE NEGATIVE Final    Comment: (NOTE) SARS-CoV-2 target nucleic acids are NOT DETECTED.  The SARS-CoV-2 RNA is generally detectable in upper respiratory specimens during the acute phase of infection. The lowest concentration of SARS-CoV-2 viral copies this assay can detect is 138 copies/mL. A negative result does not preclude SARS-Cov-2 infection and should not be used as the sole basis for treatment or other patient management decisions. A negative result may occur with  improper specimen collection/handling, submission of specimen other than nasopharyngeal swab, presence of viral mutation(s) within the areas targeted by this assay, and inadequate number of viral copies(<138 copies/mL). A negative result must be combined with clinical observations,  patient history, and epidemiological information. The expected result is Negative.  Fact Sheet for Patients:  EntrepreneurPulse.com.au  Fact Sheet for Healthcare Providers:  IncredibleEmployment.be  This test is no t yet approved or cleared by the [place] FDA and  has been authorized for detection and/or diagnosis of SARS-CoV-2 by FDA under an Emergency Use Authorization (EUA). This EUA will remain  in effect (meaning this test can be used) for the duration of the COVID-19 declaration under Section 564(b)(1) of the Act, 21 U.S.C.section 360bbb-3(b)(1), unless the authorization is terminated  or revoked sooner.       Influenza A by PCR NEGATIVE NEGATIVE Final   Influenza B by PCR NEGATIVE NEGATIVE Final    Comment: (NOTE) The Xpert Xpress SARS-CoV-2/FLU/RSV plus assay is intended as an aid in the diagnosis of influenza from Nasopharyngeal swab specimens and should not be used as a sole basis for treatment. Nasal washings and aspirates are unacceptable for Xpert Xpress SARS-CoV-2/FLU/RSV testing.  Fact Sheet for Patients: EntrepreneurPulse.com.au  Fact Sheet for Healthcare Providers: IncredibleEmployment.be  This test is not yet approved or cleared by the [place] FDA and has been authorized for detection and/or diagnosis of SARS-CoV-2 by FDA under an Emergency Use Authorization (EUA). This EUA will remain in effect (meaning this test can be used) for the duration of the COVID-19 declaration under Section 564(b)(1) of the Act, 21 U.S.C. section 360bbb-3(b)(1), unless the authorization is terminated or revoked.     Resp Syncytial Virus by PCR POSITIVE (A) NEGATIVE Final    Comment: (NOTE) Fact Sheet for Patients: EntrepreneurPulse.com.au  Fact Sheet for Healthcare Providers: IncredibleEmployment.be  This test is not yet approved or cleared by the [place] and has been authorized  for detection and/or diagnosis of SARS-CoV-2 by FDA under an Emergency Use Authorization (EUA). This EUA will remain in effect (meaning this test can be used) for the duration of the COVID-19 declaration under Section 564(b)(1) of the Act, 21 U.S.C. section 360bbb-3(b)(1), unless the authorization is terminated or revoked.  Performed at [facility], [address] [place], [place] [address]     Imaging: None  Procedure: This 16 French three-way Foley catheter was removed.  I subsequently advanced a 24 [place] three-way Foley catheter.  I then irrigated after liter of normal saline and was able to irrigate some clots, but there was really not very many clots within his bladder.  His urine subsequently cleared quite quickly.  Imp: Gross hematuria and blood per urethra, suspect this is prostatic in origin, however the patient does need a full hematuria evaluation.  Currently his urine is clear on a very slow CBI drip.  Recommendations: Wean CBI to off as able. Obtain CT scan of the abdomen and pelvis with and without contrast, hematuria protocol with the patient settled down and is not quite as agitated.  While the patient is on continuous bladder irrigation we will continue to follow.  Ardis Hughs

## 2022-10-30 NOTE — Assessment & Plan Note (Addendum)
Pt in SNF following this earlier this month. WBAT with assistance. Morphine PRN for pain at the moment

## 2022-10-30 NOTE — Assessment & Plan Note (Signed)
Pt with persistent gross hematuria. On CBI HGB 9.1 essentially unchanged at this point Continue CBI Hold eliquis Repeat CBC in AM Pt getting transferred to [facility] Dr. Louis Meckel will see pt after he gets there, please call Dr. Louis Meckel on patient arrival.

## 2022-10-30 NOTE — Assessment & Plan Note (Signed)
With superimposed mild AKI today. IVF CBI for hematuria Hold lasix Strict intake and output Repeat BMP in AM

## 2022-10-30 NOTE — Progress Notes (Addendum)
PROGRESS NOTE    Ricardo Garrett  HER:[id number] DOB: 1945-11-29 DOA: 10/29/2022 PCP: [name], MD     Brief Narrative:  76 year old white male with a history of alcohol abuse, CHF, CAD, A-fib on Eliquis, COPD, and dementia presenting from SNF for confusion and hematuria.  He was also found to be RSV positive and C. difficile positive.  He had 4 hours of CBI in the ER without resolution.  Urology was consulted and used a different catheter with success.   New events last 24 hours / Subjective: Patient needs hematuria workup but cannot probably sit still for a CT urogram. Nonverbal today. Appears comfortable but agitated when being examined.   Assessment & Plan:   Principal Problem:   Gross hematuria  Appreciate urology  Monitor CBC  Will order CT urogram when he is less agitated  LR 75 mL/hr  Active Problems:   Alcohol abuse  B12 daily  Coming from SNF, correctly pointed out it is unlikely he was abusing alcohol there    CKD (chronic kidney disease) stage 3, GFR 30-59 ml/min (HCC)  Monitor    Closed fracture of greater trochanter of right femur ([place])  Will likely need SNF upon DC  Morphine prn    Paroxysmal atrial fibrillation (HCC)  Digoxin 125 mcg daily    Chronic combined systolic and diastolic heart failure (HCC)  Toprol XL 50 mg bid    Essential (primary) hypertension     Altered mental status  Monitor, if no improvement by tomorrow, will check MRI brain vs discuss case with Neuro  Try to limit opioids/benzos    RSV exposure  Supportive care    C. difficile diarrhea  Vancomycin 125 mg 4 times daily   DVT prophylaxis: SCDs Code Status: DNR Family Communication: None at bedside Coming From: SNF Disposition Plan: TBD Barriers to Discharge: Clinical improvement  Consultants:  Urology  Procedures:  none  Antimicrobials:  Anti-infectives (From admission, onward)    Start     Dose/Rate Route Frequency Ordered Stop   10/30/22 0215  vancomycin  (VANCOCIN) capsule 125 mg        125 mg Oral 4 times daily 10/30/22 0207     10/30/22 0015  vancomycin (VANCOCIN) 50 mg/mL oral solution SOLN 125 mg  Status:  Discontinued        125 mg Oral 4 times daily 10/30/22 0010 10/30/22 0207   10/29/22 2300  fidaxomicin (DIFICID) tablet 200 mg  Status:  Discontinued        200 mg Oral 2 times daily 10/29/22 2253 10/29/22 2256   10/29/22 2300  vancomycin (VANCOCIN) capsule 125 mg  Status:  Discontinued        125 mg Oral 4 times daily 10/29/22 2256 10/30/22 0010   10/29/22 1615  cefTRIAXone (ROCEPHIN) 1 g in sodium chloride 0.9 % 100 mL IVPB        1 g 200 mL/hr over 30 Minutes Intravenous STAT 10/29/22 1612 10/29/22 1727        Objective: Vitals:   10/30/22 0034 10/30/22 0200 10/30/22 0617 10/30/22 0943  BP: 121/67 133/86 114/73 128/77  Pulse: (!) 137 100 94 (!) 106  Resp: '18 18 18 20  '$ Temp: (!) 96.9 F (36.1 C) 98.1 F (36.7 C) 98 F (36.7 C) 98.8 F (37.1 C)  TempSrc: Axillary Axillary Axillary Axillary  SpO2: 90% 95% 97% 92%  Weight:      Height:        Intake/Output Summary (Last 24 hours)  at 10/30/2022 1308 Last data filed at 10/30/2022 1246 Gross per 24 hour  Intake 2350 ml  Output 11075 ml  Net -8725 ml   Filed Weights   10/29/22 1212  Weight: 56 kg    Examination:  General exam: Appears calm and comfortable  Respiratory system: Clear to auscultation. Respiratory effort normal. No respiratory distress. No conversational dyspnea.  Cardiovascular system: S1 & S2 heard, RRR. No murmurs. No pedal edema. Gastrointestinal system: Abdomen is nondistended, soft and nontender. Normal bowel sounds heard. Central nervous system: Alert and oriented. No focal neurological deficits. Speech clear.  Extremities: Symmetric in appearance  Skin: No rashes, lesions or ulcers on exposed skin  Psychiatry: Judgement and insight appear normal. Mood & affect appropriate.   Data Reviewed: I have personally reviewed following labs and  imaging studies  CBC: Recent Labs  Lab 10/27/22 2110 10/29/22 1457  WBC 6.3 6.7  NEUTROABS 4.7  --   HGB 9.4* 9.1*  HCT 29.4* 28.8*  MCV 102.4* 103.6*  PLT 319 161   Basic Metabolic Panel: Recent Labs  Lab 10/27/22 2110 10/29/22 1457  NA 139 141  K 3.9 4.3  CL 114* 117*  CO2 20* 21*  GLUCOSE 99 103*  BUN 23 24*  CREATININE 1.94* 1.89*  CALCIUM 7.6* 8.2*  MG 1.7  --    GFR: Estimated Creatinine Clearance: 25.5 mL/min (A) (by C-G formula based on SCr of 1.89 mg/dL (H)). Liver Function Tests: Recent Labs  Lab 10/27/22 2110  AST 41  ALT 29  ALKPHOS 117  BILITOT 0.5  PROT 5.8*  ALBUMIN 2.8*   Recent Labs  Lab 10/27/22 2110  AMMONIA <10   Recent Results (from the past 240 hour(s))  Urine Culture     Status: None   Collection Time: 10/27/22  8:31 PM   Specimen: Urine, Clean Catch  Result Value Ref Range Status   Specimen Description   Final    URINE, CLEAN CATCH Performed at [facility], [address] [place], [place] [address]    Special Requests   Final    NONE Performed at [facility], [address], [place], [place] [address]    Culture   Final    NO GROWTH Performed at [facility], [place] [address] [place], [place] [address]    Report Status 10/29/2022 FINAL  Final  C Difficile Quick Screen w PCR reflex     Status: Abnormal   Collection Time: 10/27/22  8:35 PM   Specimen: Urine, Clean Catch; Stool  Result Value Ref Range Status   C Diff antigen POSITIVE (A) NEGATIVE Final   C Diff toxin POSITIVE (A) NEGATIVE Final   C Diff interpretation Toxin producing C. difficile detected.  Final    Comment: CRITICAL RESULT CALLED TO, READ BACK BY AND VERIFIED WITH: MOSTELLER,M. '@0116'$  ON 10/28/22 BY [facility] Performed at [facility], [address] [place], [place] [address]   Resp panel by RT-PCR (RSV, Flu A&B, Covid) Anterior Nasal Swab     Status: Abnormal   Collection Time: 10/27/22  9:00 PM   Specimen: Anterior Nasal Swab  Result Value Ref  Range Status   SARS Coronavirus 2 by RT PCR NEGATIVE NEGATIVE Final    Comment: (NOTE) SARS-CoV-2 target nucleic acids are NOT DETECTED.  The SARS-CoV-2 RNA is generally detectable in upper respiratory specimens during the acute phase of infection. The lowest concentration of SARS-CoV-2 viral copies this assay can detect is 138 copies/mL. A negative result does not preclude SARS-Cov-2  infection and should not be used as the sole basis for treatment or other patient management decisions. A negative result may occur with  improper specimen collection/handling, submission of specimen other than nasopharyngeal swab, presence of viral mutation(s) within the areas targeted by this assay, and inadequate number of viral copies(<138 copies/mL). A negative result must be combined with clinical observations, patient history, and epidemiological information. The expected result is Negative.  Fact Sheet for Patients:  EntrepreneurPulse.com.au  Fact Sheet for Healthcare Providers:  IncredibleEmployment.be  This test is no t yet approved or cleared by the [place] FDA and  has been authorized for detection and/or diagnosis of SARS-CoV-2 by FDA under an Emergency Use Authorization (EUA). This EUA will remain  in effect (meaning this test can be used) for the duration of the COVID-19 declaration under Section 564(b)(1) of the Act, 21 U.S.C.section 360bbb-3(b)(1), unless the authorization is terminated  or revoked sooner.       Influenza A by PCR NEGATIVE NEGATIVE Final   Influenza B by PCR NEGATIVE NEGATIVE Final    Comment: (NOTE) The Xpert Xpress SARS-CoV-2/FLU/RSV plus assay is intended as an aid in the diagnosis of influenza from Nasopharyngeal swab specimens and should not be used as a sole basis for treatment. Nasal washings and aspirates are unacceptable for Xpert Xpress SARS-CoV-2/FLU/RSV testing.  Fact Sheet for  Patients: EntrepreneurPulse.com.au  Fact Sheet for Healthcare Providers: IncredibleEmployment.be  This test is not yet approved or cleared by the [place] FDA and has been authorized for detection and/or diagnosis of SARS-CoV-2 by FDA under an Emergency Use Authorization (EUA). This EUA will remain in effect (meaning this test can be used) for the duration of the COVID-19 declaration under Section 564(b)(1) of the Act, 21 U.S.C. section 360bbb-3(b)(1), unless the authorization is terminated or revoked.     Resp Syncytial Virus by PCR POSITIVE (A) NEGATIVE Final    Comment: (NOTE) Fact Sheet for Patients: EntrepreneurPulse.com.au  Fact Sheet for Healthcare Providers: IncredibleEmployment.be  This test is not yet approved or cleared by the [place] FDA and has been authorized for detection and/or diagnosis of SARS-CoV-2 by FDA under an Emergency Use Authorization (EUA). This EUA will remain in effect (meaning this test can be used) for the duration of the COVID-19 declaration under Section 564(b)(1) of the Act, 21 U.S.C. section 360bbb-3(b)(1), unless the authorization is terminated or revoked.  Performed at [facility], [address] [place], [place] [address]     Scheduled Meds:  digoxin  125 mcg Oral Daily   metoprolol succinate  50 mg Oral Once   metoprolol succinate  50 mg Oral BID   thiamine  100 mg Oral Daily   Or   thiamine  100 mg Intravenous Daily   vancomycin  125 mg Oral QID   Continuous Infusions:  lactated ringers 75 mL/hr at 10/30/22 0257   sodium chloride irrigation       LOS: 0 days    Time spent: 40 minutes   Shelda Pal, DO [facility] 10/30/2022, 1:08 PM   Available via Epic secure chat 7am-7pm After these hours, please refer to coverage provider listed on [email or web address]

## 2022-10-30 NOTE — Assessment & Plan Note (Signed)
Hold BP meds and lasix due to soft BPs in ED

## 2022-10-30 NOTE — Progress Notes (Signed)
Patient ID: Ricardo Garrett, male   DOB: Apr 07, 1946, 76 y.o.   MRN: [id number]    Subjective: Pt presented yesterday from [facility] with blood per urethra.  Hematuria catheter placed by Dr. Louis Meckel and started on CBI.  Anticoagulation being held.  Patient remains confused which reportedly is his baseline with known dementia.  Objective: Vital signs in last 24 hours: Temp:  [96.9 F (36.1 C)-98.1 F (36.7 C)] 98 F (36.7 C) (12/31 0617) Pulse Rate:  [56-137] 94 (12/31 0617) Resp:  [16-27] 18 (12/31 0617) BP: (102-142)/(61-124) 114/73 (12/31 0617) SpO2:  [85 %-100 %] 97 % (12/31 0617) FiO2 (%):  [21 %] 21 % (12/30 2250) Weight:  [56 kg] 56 kg (12/30 1212)  Intake/Output from previous day: 12/30 0701 - 12/31 0700 In: 2350 [I.V.:300; IV Piggyback:550] Out: 3734 [Urine:8525] Intake/Output this shift: No intake/output data recorded.  Physical Exam:  General: Alert and oriented GU: Urine now clearing with CBI titrated to off.  Lab Results: Recent Labs    10/27/22 2110 10/29/22 1457  HGB 9.4* 9.1*  HCT 29.4* 28.8*   BMET Recent Labs    10/27/22 2110 10/29/22 1457  NA 139 141  K 3.9 4.3  CL 114* 117*  CO2 20* 21*  GLUCOSE 99 103*  BUN 23 24*  CREATININE 1.94* 1.89*  CALCIUM 7.6* 8.2*     Studies/Results: No results found.  Assessment/Plan: 1) Hematuria: Check H/H this morning and will hold CBI over next 24 hrs.  If his urine remains clear, will consider removal of catheter tomorrow as he is at high risk to pull on catheter and cause catheter trauma.  Would recommend continuation of restraints until then.  Continue to hold anticoagulation and will need to assess risk/benefit of when to restart that once catheter is able to be removed.  Can then undergo an outpatient hematuria evaluation.  Will repeat Cr today to see if he could undergo a hematuria protocol CT scan while here if renal function is better.   LOS: 0 days   Dutch Gray 10/30/2022, 8:35 AM

## 2022-10-31 DIAGNOSIS — N179 Acute kidney failure, unspecified: Secondary | ICD-10-CM

## 2022-10-31 DIAGNOSIS — R319 Hematuria, unspecified: Secondary | ICD-10-CM

## 2022-10-31 DIAGNOSIS — R310 Gross hematuria: Secondary | ICD-10-CM | POA: Diagnosis not present

## 2022-10-31 DIAGNOSIS — Z7189 Other specified counseling: Secondary | ICD-10-CM

## 2022-10-31 DIAGNOSIS — Z515 Encounter for palliative care: Secondary | ICD-10-CM

## 2022-10-31 LAB — CBC
HCT: 28.1 % — ABNORMAL LOW (ref 39.0–52.0)
Hemoglobin: 8.6 g/dL — ABNORMAL LOW (ref 13.0–17.0)
MCH: 32.2 pg (ref 26.0–34.0)
MCHC: 30.6 g/dL (ref 30.0–36.0)
MCV: 105.2 fL — ABNORMAL HIGH (ref 80.0–100.0)
Platelets: 269 10*3/uL (ref 150–400)
RBC: 2.67 MIL/uL — ABNORMAL LOW (ref 4.22–5.81)
RDW: 15 % (ref 11.5–15.5)
WBC: 14.7 10*3/uL — ABNORMAL HIGH (ref 4.0–10.5)
nRBC: 0 % (ref 0.0–0.2)

## 2022-10-31 LAB — BASIC METABOLIC PANEL
Anion gap: 9 (ref 5–15)
BUN: 31 mg/dL — ABNORMAL HIGH (ref 8–23)
CO2: 16 mmol/L — ABNORMAL LOW (ref 22–32)
Calcium: 8.2 mg/dL — ABNORMAL LOW (ref 8.9–10.3)
Chloride: 117 mmol/L — ABNORMAL HIGH (ref 98–111)
Creatinine, Ser: 2.26 mg/dL — ABNORMAL HIGH (ref 0.61–1.24)
GFR, Estimated: 29 mL/min — ABNORMAL LOW (ref >60)
Glucose, Bld: 91 mg/dL (ref 70–99)
Potassium: 4.2 mmol/L (ref 3.5–5.1)
Sodium: 142 mmol/L (ref 135–145)

## 2022-10-31 LAB — URINE CULTURE: Culture: NO GROWTH

## 2022-10-31 MED ORDER — METOPROLOL TARTRATE 5 MG/5ML IV SOLN
2.5000 mg | Freq: Once | INTRAVENOUS | Status: AC | PRN
  Administered 2022-10-31: 2.5 mg via INTRAVENOUS
  Filled 2022-10-31: qty 5

## 2022-10-31 MED ORDER — DIGOXIN 0.25 MG/ML IJ SOLN: 0.2500 mg | Freq: Four times a day (QID) | INTRAMUSCULAR | Status: DC

## 2022-10-31 MED ORDER — DIGOXIN 0.25 MG/ML IJ SOLN
0.2500 mg | INTRAMUSCULAR | Status: AC
  Administered 2022-10-31: 0.25 mg via INTRAVENOUS
  Filled 2022-10-31: qty 2

## 2022-10-31 MED ORDER — METOPROLOL TARTRATE 5 MG/5ML IV SOLN
2.5000 mg | Freq: Once | INTRAVENOUS | Status: AC
  Administered 2022-10-31: 2.5 mg via INTRAVENOUS
  Filled 2022-10-31: qty 5

## 2022-10-31 MED ORDER — HYOSCYAMINE SULFATE 0.125 MG SL SUBL
0.1250 mg | SUBLINGUAL_TABLET | Freq: Four times a day (QID) | SUBLINGUAL | Status: DC | PRN
  Administered 2022-10-31: 0.125 mg via SUBLINGUAL
  Filled 2022-10-31: qty 1

## 2022-10-31 MED ORDER — DIGOXIN 0.25 MG/ML IJ SOLN
0.1250 mg | Freq: Three times a day (TID) | INTRAMUSCULAR | Status: AC
  Administered 2022-10-31 – 2022-11-01 (×2): 0.125 mg via INTRAVENOUS
  Filled 2022-10-31 (×2): qty 2

## 2022-10-31 MED ORDER — METOPROLOL TARTRATE 5 MG/5ML IV SOLN
2.5000 mg | INTRAVENOUS | Status: AC | PRN
  Administered 2022-10-31: 2.5 mg via INTRAVENOUS
  Filled 2022-10-31: qty 5

## 2022-10-31 MED ORDER — VANCOMYCIN HCL 500 MG IV SOLR
500.0000 mg | Freq: Four times a day (QID) | Status: DC
  Administered 2022-10-31 – 2022-11-01 (×4): 500 mg via RECTAL
  Filled 2022-10-31 (×5): qty 10

## 2022-10-31 MED ORDER — METOPROLOL TARTRATE 5 MG/5ML IV SOLN
5.0000 mg | Freq: Four times a day (QID) | INTRAVENOUS | Status: DC
  Administered 2022-10-31 – 2022-11-01 (×3): 5 mg via INTRAVENOUS
  Filled 2022-10-31 (×3): qty 5

## 2022-10-31 NOTE — Progress Notes (Signed)
PROGRESS NOTE    Ricardo Garrett  PQZ:[id number] DOB: 08-19-1946 DOA: 10/29/2022 PCP: [name], MD     Brief Narrative:  77 year old white male with a history of alcohol abuse, CHF, CAD, A-fib on Eliquis, COPD, and dementia presenting from SNF for confusion and hematuria.  He was also found to be RSV positive and C. difficile positive.  He had 4 hours of CBI in the ER without resolution.  Urology was consulted and used a different catheter with success.  Urologic workup needed.   New events last 24 hours / Subjective: Patient is still nonverbal.  I spoke with his wife today who notes that he was at least answering questions prior to his current admission.  He has not developed a leukocytosis.  He has been unable to keep down any oral medications due to confusion and agitation.  He slipped and A-fib with RVR today.  Received a couple pushes of metoprolol.  Assessment & Plan:   Principal Problem:   Gross hematuria             Appreciate urology             Monitor CBC             Will order CT urogram when he is less agitated             LR 75 mL/hr   Active Problems:   Alcohol abuse             B12 daily     Unlikely he is currently abusing as he came from SNF     CKD (chronic kidney disease) stage 3, GFR 30-59 ml/min (HCC)             Monitor     Closed fracture of greater trochanter of right femur (HCC)             Will likely need SNF upon DC             Morphine prn     Paroxysmal atrial fibrillation (HCC)             Digoxin 125 mcg daily  Required a few pushes of IV metoprolol     Chronic combined systolic and diastolic heart failure (HCC)             Toprol XL 50 mg bid     Essential (primary) hypertension                Altered mental status             Monitor, if no improvement by tomorrow, will check MRI brain vs discuss case with Neuro             Try to limit opioids/benzos     RSV exposure             Supportive care     C. difficile diarrhea              As he is unable to take oral medications due to confusion/agitation, we will order rectal vancomycin 500 mg 4 times daily; discussed this with infectious disease and will formally consult them if he does not improve  DVT prophylaxis: SCDs Code Status: DNR Family Communication: None at bedside, spoke with wife [name] today on the phone Coming From: SNF Disposition Plan: TBD Barriers to Discharge: Clinical improvement  Consultants:  Urology  Antimicrobials:  Anti-infectives (From admission, onward)    Start  Dose/Rate Route Frequency Ordered Stop   10/31/22 1200  vancomycin (VANCOCIN) 500 mg in sodium chloride irrigation 0.9 % 100 mL ENEMA        500 mg Rectal Every 6 hours 10/31/22 1030 11/14/22 1159   10/30/22 0215  vancomycin (VANCOCIN) capsule 125 mg  Status:  Discontinued        125 mg Oral 4 times daily 10/30/22 0207 10/31/22 1031   10/30/22 0015  vancomycin (VANCOCIN) 50 mg/mL oral solution SOLN 125 mg  Status:  Discontinued        125 mg Oral 4 times daily 10/30/22 0010 10/30/22 0207   10/29/22 2300  fidaxomicin (DIFICID) tablet 200 mg  Status:  Discontinued        200 mg Oral 2 times daily 10/29/22 2253 10/29/22 2256   10/29/22 2300  vancomycin (VANCOCIN) capsule 125 mg  Status:  Discontinued        125 mg Oral 4 times daily 10/29/22 2256 10/30/22 0010   10/29/22 1615  cefTRIAXone (ROCEPHIN) 1 g in sodium chloride 0.9 % 100 mL IVPB        1 g 200 mL/hr over 30 Minutes Intravenous STAT 10/29/22 1612 10/29/22 1727        Objective: Vitals:   10/30/22 2232 10/31/22 0300 10/31/22 0807 10/31/22 0900  BP: 124/77 (!) 126/91 124/88 113/75  Pulse: (!) 108 (!) 111 (!) 105   Resp: '20 20 18   '$ Temp: 97.9 F (36.6 C)  (!) 97.5 F (36.4 C)   TempSrc: Axillary  Axillary   SpO2:  98%    Weight:      Height:        Intake/Output Summary (Last 24 hours) at 10/31/2022 1031 Last data filed at 10/31/2022 0700 Gross per 24 hour  Intake 7068.36 ml  Output 9925 ml  Net  -2856.64 ml   Filed Weights   10/29/22 1212  Weight: 56 kg    Examination:  General exam: Appears agitated Respiratory system: Clear to auscultation. No respiratory distress. No conversational dyspnea.  Cardiovascular system: S1 & S2 heard, irregularly irregular rhythm, tachycardic. Gastrointestinal system: Abdomen is nondistended, he is guarding and appears uncomfortable with palpation.  Central nervous system: He does not respond to questions Extremities: Symmetric in appearance  Skin: No rashes, lesions or ulcers on exposed skin  Psychiatry: Unable to assess  Data Reviewed: I have personally reviewed following labs and imaging studies  CBC: Recent Labs  Lab 10/27/22 2110 10/29/22 1457 10/31/22 0641  WBC 6.3 6.7 14.7*  NEUTROABS 4.7  --   --   HGB 9.4* 9.1* 8.6*  HCT 29.4* 28.8* 28.1*  MCV 102.4* 103.6* 105.2*  PLT 319 307 151   Basic Metabolic Panel: Recent Labs  Lab 10/27/22 2110 10/29/22 1457 10/31/22 0641  NA 139 141 142  K 3.9 4.3 4.2  CL 114* 117* 117*  CO2 20* 21* 16*  GLUCOSE 99 103* 91  BUN 23 24* 31*  CREATININE 1.94* 1.89* 2.26*  CALCIUM 7.6* 8.2* 8.2*  MG 1.7  --   --    GFR: Estimated Creatinine Clearance: 21.3 mL/min (A) (by C-G formula based on SCr of 2.26 mg/dL (H)).  Liver Function Tests: Recent Labs  Lab 10/27/22 2110  AST 41  ALT 29  ALKPHOS 117  BILITOT 0.5  PROT 5.8*  ALBUMIN 2.8*   Recent Labs  Lab 10/27/22 2110  AMMONIA <10   Recent Results (from the past 240 hour(s))  Urine Culture     Status: None  Collection Time: 10/27/22  8:31 PM   Specimen: Urine, Clean Catch  Result Value Ref Range Status   Specimen Description   Final    URINE, CLEAN CATCH Performed at [facility], [address] [place], [place] [address]    Special Requests   Final    NONE Performed at [facility], [address] [place], [place] [address]    Culture   Final    NO GROWTH Performed at [facility], [place] [address]  [place], [place] [address]    Report Status 10/29/2022 FINAL  Final  C Difficile Quick Screen w PCR reflex     Status: Abnormal   Collection Time: 10/27/22  8:35 PM   Specimen: Urine, Clean Catch; Stool  Result Value Ref Range Status   C Diff antigen POSITIVE (A) NEGATIVE Final   C Diff toxin POSITIVE (A) NEGATIVE Final   C Diff interpretation Toxin producing C. difficile detected.  Final    Comment: CRITICAL RESULT CALLED TO, READ BACK BY AND VERIFIED WITH: MOSTELLER,M. '@0116'$  ON 10/28/22 BY [facility] Performed at [facility], [address] [place], [place] [address]   Resp panel by RT-PCR (RSV, Flu A&B, Covid) Anterior Nasal Swab     Status: Abnormal   Collection Time: 10/27/22  9:00 PM   Specimen: Anterior Nasal Swab  Result Value Ref Range Status   SARS Coronavirus 2 by RT PCR NEGATIVE NEGATIVE Final    Comment: (NOTE) SARS-CoV-2 target nucleic acids are NOT DETECTED.  The SARS-CoV-2 RNA is generally detectable in upper respiratory specimens during the acute phase of infection. The lowest concentration of SARS-CoV-2 viral copies this assay can detect is 138 copies/mL. A negative result does not preclude SARS-Cov-2 infection and should not be used as the sole basis for treatment or other patient management decisions. A negative result may occur with  improper specimen collection/handling, submission of specimen other than nasopharyngeal swab, presence of viral mutation(s) within the areas targeted by this assay, and inadequate number of viral copies(<138 copies/mL). A negative result must be combined with clinical observations, patient history, and epidemiological information. The expected result is Negative.  Fact Sheet for Patients:  EntrepreneurPulse.com.au  Fact Sheet for Healthcare Providers:  IncredibleEmployment.be  This test is no t yet approved or cleared by the [place] FDA and  has been authorized for detection and/or diagnosis  of SARS-CoV-2 by FDA under an Emergency Use Authorization (EUA). This EUA will remain  in effect (meaning this test can be used) for the duration of the COVID-19 declaration under Section 564(b)(1) of the Act, 21 U.S.C.section 360bbb-3(b)(1), unless the authorization is terminated  or revoked sooner.       Influenza A by PCR NEGATIVE NEGATIVE Final   Influenza B by PCR NEGATIVE NEGATIVE Final    Comment: (NOTE) The Xpert Xpress SARS-CoV-2/FLU/RSV plus assay is intended as an aid in the diagnosis of influenza from Nasopharyngeal swab specimens and should not be used as a sole basis for treatment. Nasal washings and aspirates are unacceptable for Xpert Xpress SARS-CoV-2/FLU/RSV testing.  Fact Sheet for Patients: EntrepreneurPulse.com.au  Fact Sheet for Healthcare Providers: IncredibleEmployment.be  This test is not yet approved or cleared by the [place] FDA and has been authorized for detection and/or diagnosis of SARS-CoV-2 by FDA under an Emergency Use Authorization (EUA). This EUA will remain in effect (meaning this test can be used) for the duration of the COVID-19 declaration under Section 564(b)(1) of the Act, 21 U.S.C. section 360bbb-3(b)(1), unless the  authorization is terminated or revoked.     Resp Syncytial Virus by PCR POSITIVE (A) NEGATIVE Final    Comment: (NOTE) Fact Sheet for Patients: EntrepreneurPulse.com.au  Fact Sheet for Healthcare Providers: IncredibleEmployment.be  This test is not yet approved or cleared by the [place] FDA and has been authorized for detection and/or diagnosis of SARS-CoV-2 by FDA under an Emergency Use Authorization (EUA). This EUA will remain in effect (meaning this test can be used) for the duration of the COVID-19 declaration under Section 564(b)(1) of the Act, 21 U.S.C. section 360bbb-3(b)(1), unless the authorization is terminated  or revoked.  Performed at [facility], [address] [place], [place] [address]   Urine Culture     Status: None   Collection Time: 10/29/22  4:12 PM   Specimen: Urine, Clean Catch  Result Value Ref Range Status   Specimen Description   Final    URINE, CLEAN CATCH Performed at [facility], [address], [place], [place] [address]    Special Requests   Final    NONE Performed at [facility], [address] [place], [place] [address]    Culture   Final    NO GROWTH Performed at [facility], [place] [address], [place], [place] [address]    Report Status 10/31/2022 FINAL  Final     Scheduled Meds:  digoxin  125 mcg Oral Daily   metoprolol succinate  50 mg Oral Once   metoprolol succinate  50 mg Oral BID   thiamine  100 mg Oral Daily   Or   thiamine  100 mg Intravenous Daily   vancomycin (VANCOCIN) 500 mg in sodium chloride irrigation 0.9 % 100 mL ENEMA  500 mg Rectal Q6H   Continuous Infusions:  lactated ringers 75 mL/hr at 10/31/22 0606   sodium chloride irrigation       LOS: 1 day    Time spent: 35 minutes   Shelda Pal, DO [facility] 10/31/2022, 10:31 AM   Available via Epic secure chat 7am-7pm After these hours, please refer to coverage provider listed on [email or web address]

## 2022-10-31 NOTE — Progress Notes (Signed)
Daily Progress Note   Patient Name: Ricardo Garrett       Date: 10/31/2022 DOB: 1946-04-28  Age: 77 y.o. MRN#: [id number] Attending Physician: Shelda Pal,* Primary Care Physician: [name], MD Admit Date: 10/29/2022 Length of Stay: 1 day  Bedside RN informed this provider that later in day patient's daughter, Ricardo Garrett, present at bedside.  Presented to bedside and introduced myself to Ricardo Garrett.  We were able to speak privately outside of room as patient remained agitated.  Spent extensive time reviewing patient's medical course with daughter.  Daughter is overwhelmed with all that has been going on with patient's healthcare.  Daughter tearful at times.  Daughter described how she is been assisting with not only the patient's care but her mother's care (patient's wife) because the patient's wife has issues with memory as well.  Ricardo Garrett has essentially been the main point in care person for both of her parents.  Spent extensive time discussing the deterioration that can occur when someone has had multiple medical insults from illness and how people do not recover as they do when they are younger.  Discussed idea of delirium in the multiple factors that can contribute to this.  Discussed patient's lack of oral intake, not taking oral medications, continued agitation, worsening renal function, and increasing leukocytosis in the setting of his multiple medical conditions.  Time getting to know about patient prior to fracture that set off these events and motion.  Patient was extremely functional prior to fracture and only had signs of mild cognitive impairment as according to daughter.  Since fracture and hospitalizations though patient has never returned to his baseline.  Ricardo Garrett explained that patient has stated to her multiple times now within the past month that his time is short and he knows he is dying.  Ricardo Garrett feels that the patient was trying to prepare her though she was not prepared  for this.  Ricardo Garrett noted that patient has stated quite clearly he would never want aggressive medical interventions such as CPR and mechanical intubation.  We discussed continuing current level of medical interventions though with knowing patient's wishes would not escalate care to ICU as patient would not want to be tied to die in the hospital.  Ricardo Garrett agreeing with this.  Spent time discussing pathways for medical care moving forward.  Discussed continuing with aggressive medical interventions versus transition to comfort focused care.  Ricardo Garrett noted that she is actually been asking [facility] hospice liaison that works at her facility about hospice support at home.  Discussed what hospice support at home would and would not provide.  Ricardo Garrett has been the primary caregiver for both of her parents and so noted that burden still falls to family even with home hospice support.  Ricardo Garrett is adamant that patient is at the end of his life, he would want to die at home surrounded by family.  Acknowledged this.  Ricardo Garrett wanting to continue discussions though wanting her sister and mother present.  Plan for family meeting tomorrow at 12:30 PM to discuss care moving forward.  Ricardo Garrett notes that her brother is not able to participate in family meeting as he is currently incarcerated.  Ricardo Garrett also noted she completed paperwork with lawyer and patient had previously completed paperwork regarding POA.  Ricardo Garrett was unsure if this had any mention of healthcare wishes though she will reach out to lawyer tomorrow to find out.  Spent ample time providing emotional support via active listening.  All questions answered at that  time.  Noted plan to meet with family tomorrow.  # Recommendations: 1) Continue current appropriate medical care.  Should patient's medical status deteriorate overnight, daughter Ricardo Garrett should be informed of this if she can present to bedside.  Patient has informed family that he would not want aggressive medical  interventions and so discussed today not escalating level of care to ICU as per patient's previously discussed wishes with family. 2) Planning for family meeting tomorrow (1/2) with wife and 2 daughters at 12:30 PM to discuss pathways for medical care moving forward.  Did mention pathway of continuing with aggressive medical care versus transition to comfort focused care and getting patient home with hospice.  Daughter stated that patient would absolutely not want to die in the hospital and would want to die at home surrounded by his loved ones.  Discussed with: bedside RN, hospitalist, daughter  Thank you for allowing the palliative care team to participate in the care [name].  Chelsea Aus, DO Palliative Care Provider PMT # [phone]  This provider spent a addition total time of 45 minutes providing patient's care. This time is in ADDITION to time provided earlier in during as detail in initial consult note. Time includes review of EMR, discussing care with other staff members involved in patient's medical care, obtaining relevant history and information from patient and/or patient's family, and personal review of imaging and lab work. Greater than 50% of the time was spent counseling and coordinating care related to the above assessment and plan.

## 2022-10-31 NOTE — TOC Initial Note (Addendum)
Transition of Care [facility]) - Initial/Assessment Note    Patient Details  Name: Ricardo Garrett MRN: [id number] Date of Birth: 11-Mar-1946  [facility] CM/SW Contact:    Dessa Phi, RN Phone Number: 10/31/2022, 12:39 PM  Clinical Narrative:  Will confirm w/[facility] if resident there  level of care. Also to confirm [facility] & services. -2:20p-Confirmed from [facility]-will await PT eval;facility will start auth when appropriate. Confirmed [facility] is not providing services currently.                Expected Discharge Plan: Skilled Nursing Facility Barriers to Discharge: Continued Medical Work up   Patient Goals and CMS Choice            Expected Discharge Plan and Services                                              Prior Living Arrangements/Services                       Activities of Daily Living Home Assistive Devices/Equipment: Other (Comment) (Unsure) ADL Screening (condition at time of admission) Patient's cognitive ability adequate to safely complete daily activities?: No Is the patient deaf or have difficulty hearing?: Yes Does the patient have difficulty seeing, even when wearing glasses/contacts?: No (Unable to assess, no family present) Does the patient have difficulty concentrating, remembering, or making decisions?: Yes Patient able to express need for assistance with ADLs?: No Does the patient have difficulty dressing or bathing?: Yes Independently performs ADLs?: No Communication: Needs assistance Is this a change from baseline?: Pre-admission baseline Dressing (OT): Dependent Is this a change from baseline?: Pre-admission baseline Grooming: Dependent Is this a change from baseline?: Pre-admission baseline Feeding: Dependent Is this a change from baseline?: Pre-admission baseline Bathing: Dependent Is this a change from baseline?: Pre-admission  baseline Toileting: Dependent Is this a change from baseline?: Pre-admission baseline In/Out Bed: Dependent Is this a change from baseline?: Pre-admission baseline Walks in Home: Dependent Is this a change from baseline?: Pre-admission baseline Does the patient have difficulty walking or climbing stairs?: Yes Weakness of Legs: Both Weakness of Arms/Hands: None  Permission Sought/Granted                  Emotional Assessment              Admission diagnosis:  Gross hematuria [R31.0] Hematuria, unspecified type [R31.9] Patient Active Problem List   Diagnosis Date Noted   C. difficile diarrhea 10/30/2022   Gross hematuria 10/29/2022   RSV exposure 10/29/2022   Altered mental status 10/09/2022    Class: Acute   Closed fracture of greater trochanter of right femur ([place]) 10/07/2022   Alcohol use 10/07/2022   Hypomagnesemia 10/07/2022   Paroxysmal atrial fibrillation ([place]) 10/07/2022   Essential hypertension 10/07/2022   HLD (hyperlipidemia) 10/07/2022   Fall at home, initial encounter 10/07/2022   Anemia 09/28/2022   Mixed anxiety and depressive disorder 07/06/2022   Alcohol abuse 09/14/2021   Chronic combined systolic and diastolic heart failure ([place]) 03/10/2021   Monoclonal gammopathy 01/14/2020   Coronary arteriosclerosis 10/08/2018   Unstable angina ([place]) 11/23/2017   Coronary artery disease involving native coronary artery of native heart with unstable angina pectoris ([place])    Essential (primary) hypertension 18/29/9371   Acute systolic CHF (congestive  heart failure) ([place]) 94/58/5929   Acute systolic heart failure ([place]) 01/21/2016   Atrial fibrillation with rapid ventricular response ([place]) 01/20/2016   Anxiety disorder 01/19/2016   COPD (chronic obstructive pulmonary disease) ([place]) 01/19/2016   Anasarca 01/19/2016   CKD (chronic kidney disease) stage 3, GFR 30-59 ml/min (HCC) 01/19/2016   Chronic kidney disease, stage 3 unspecified ([place]) 01/19/2016    Difficulty in walking(719.7) 04/29/2013   Other and unspecified hyperlipidemia 03/19/2013   Anticoagulated 03/19/2013   Acute blood loss anemia 03/16/2013   Intertrochanteric fracture of right hip ([place]) 03/14/2013   Intertrochanteric fracture ([place]) 03/14/2013   PCP:  [name], MD Pharmacy:   [place], [place] [address] [place] [address] Phone: [phone] Fax: [phone]     Social Determinants of Health (SDOH) Social History: SDOH Screenings   Food Insecurity: No Food Insecurity (10/30/2022)  Housing: Low Risk  (10/30/2022)  Transportation Needs: No Transportation Needs (10/30/2022)  Utilities: Not At Risk (10/30/2022)  Alcohol Screen: Low Risk  (01/14/2020)  Depression (PHQ2-9): Low Risk  (01/14/2020)  Financial Resource Strain: Low Risk  (01/14/2020)  Physical Activity: Inactive (01/14/2020)  Social Connections: Moderately Isolated (01/14/2020)  Stress: No Stress Concern Present (01/14/2020)  Tobacco Use: Medium Risk (10/30/2022)   SDOH Interventions: Food Insecurity Interventions: Intervention Not Indicated Housing Interventions: Intervention Not Indicated Transportation Interventions: Intervention Not Indicated Utilities Interventions: Intervention Not Indicated   Readmission Risk Interventions    10/11/2022    2:59 PM  Readmission Risk Prevention Plan  Transportation Screening Complete  PCP or Specialist Appt within 3-5 Days Not Complete  HRI or [place] Complete  Social Work Consult for [place] Planning/Counseling Complete  Palliative Care Screening Not Applicable  Medication Review Press photographer) Complete

## 2022-10-31 NOTE — Progress Notes (Addendum)
Patient ID: Ricardo Garrett, male   DOB: [birth year], 77 y.o.   MRN: [id number]    Subjective: Pt still confused and unable to answer any questions.  CBI apparently had been restarted last night but no apparent clots or need to irrigate catheter noted.  Objective: Vital signs in last 24 hours: Temp:  [97.5 F (36.4 C)-98.8 F (37.1 C)] 97.5 F (36.4 C) (01/01 0807) Pulse Rate:  [100-111] 105 (01/01 0807) Resp:  [18-20] 18 (01/01 0807) BP: (124-128)/(63-91) 124/88 (01/01 0807) SpO2:  [92 %-98 %] 98 % (01/01 0300)  Intake/Output from previous day: 12/31 0701 - 01/01 0700 In: 7068.4 [I.V.:1068.4] Out: 38329 [Urine:10825] Intake/Output this shift: No intake/output data recorded.  Physical Exam:  General: Confused and not oriented GU: Urine clear on minimal CBI  Lab Results: Recent Labs    10/29/22 1457 10/31/22 0641  HGB 9.1* 8.6*  HCT 28.8* 28.1*   BMET Recent Labs    10/29/22 1457 10/31/22 0641  NA 141 142  K 4.3 4.2  CL 117* 117*  CO2 21* 16*  GLUCOSE 103* 91  BUN 24* 31*  CREATININE 1.89* 2.26*  CALCIUM 8.2* 8.2*     Studies/Results: No results found.  Assessment/Plan: 1) Hematuria: CBI had been restarted for unclear reasons yesterday.  Will hold it today but will need to maintain catheter for next 24 hrs to ensure removal will be appropriate tomorrow.  Renal function worse today so cannot get IV contrast.  Will eventually need outpatient follow up for  hematuria evaluation with cystoscopy/imaging.  Could check renal ultrasound if renal function continues to trend in the wrong direction.  Continue to hold anticoagulation.   LOS: 1 day   Dutch Gray 10/31/2022, 8:13 AM

## 2022-10-31 NOTE — Consult Note (Signed)
Consultation Note Date: 10/31/2022   Patient Name: Ricardo Garrett  DOB: 26-Jul-1946  MRN: [id number]  Age / Sex: 77 y.o., male   PCP: [name], MD Referring Physician: Shelda Pal,*  Reason for Consultation: Establishing goals of care     Chief Complaint/History of Present Illness:   Patient is a 77 year old male with a past medical history of alcohol abuse, CHF, CAD, A-fib on Eliquis, COPD, and dementia who was admitted from SNF on 10/29/2022 for management of confusion and hematuria.  During admission patient was found to be RSV positive and C. difficile positive.  Urology has been going to assist with management of hematuria.  Patient has had continued issues during hospitalization with altered mental status.  Medicine team consulted to assist with complex medical decision making. Of note, patient seen by palliative medicine team in December 2023.  Extensive review of EMR prior to seeing patient.  No ACP documentation seen on file.  Patient having issues with A-fib with RVR needing metoprolol pushes.  Patient developed leukocytosis today at 14.7.  Patient's creatinine has increased to 2.26.  Patient now not taking oral pills.  Presented to bedside.  Patient sleeping so did not awaken as he is been very confused and agitated.  Patient in mittens at this time.  No family present at bedside.  Patient appears chronically ill and frail.  After visiting patient, able to call patient's spouse, Hudsen Fei, who is next of kin.  Introduced myself and the role of the palliative medicine team in patient's care.  Despite hospitalist note stating had directly spoken with wife today, wife denied that she has talked to any providers today and does not know about her husband's current medical status.  Able to update her again as hospitalist had done earlier in the day as per documentation.  Discussed patient has multiple current medical issues including A-fib with RVR, altered mental  status, RSV exposure, C. difficile diarrhea, and worsening kidney function.  Wife acknowledged hearing this.  Spent time inquiring what patient was like before hospitalization.  Wife is unable to describe patient in great detail just notes he is "normal".  Wife is very slow to respond to questioning.  Inquired if she was visiting him at [facility] and she stated that she was.  She is able to state that they have been married for 52 years.  Wife unable to discuss patient in great details.  Inquired if patient had ever talked about his wishes for medical care if he was seriously ill and she noted that he had not.  Inquired if family has come together to discuss patient's medical care and she noted they have not.  Inquired if would be beneficial for children to be involved in supporting her in discussions about patient and she does think it would be.  Noted would attempt to call daughter who has previously discussed care with palliative medicine team during prior hospitalization.  Wife agreeing with this.  Wife noted she just "hopes patient gets better".  Again expressed concerns that patient has multiple medical issues that are not working in his favor right now and he could continue to deteriorate.  After discussion with the wife, attempted to call daughter [name] who had requested further involvement from palliative medicine team.  Unfortunately did not answer attempted calls.  Updated care team after conversation with wife.  Will continue to follow along.  Primary Diagnoses  Present on Admission:  Gross hematuria  RSV exposure  Altered mental status  Closed fracture of greater trochanter of right femur (HCC)  C. 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 12/2015: echo showing reduced EF of 35-40% b. 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 s/p DCCV in 03/2016   Tachycardia-bradycardia syndrome [facility])    Social History   Socioeconomic History   Marital status: Married    Spouse name: Not on file   Number of children: Not on file   Years of education: Not on file   Highest education level: Not on file  Occupational History   Not on file  Tobacco Use   Smoking status: Former    Packs/day: 1.00    Years: 20.00    Total pack years: 20.00    Types: Cigarettes    Quit date: 11/01/1979    Years since quitting: 43.0   Smokeless tobacco: Never  Vaping Use   Vaping Use: Never used  Substance and Sexual Activity   Alcohol use: Yes    Comment: 11/23/2017 "beer once/month maybe"   Drug use: Not Currently    Types: Marijuana    Comment: 11/23/2017 "nothing for years"   Sexual activity: Yes    Birth control/protection: None  Other Topics Concern   Not on file  Social History Narrative   Not on file    Social Determinants of Health   Financial Resource Strain: Low Risk  (01/14/2020)   Overall Financial Resource Strain (CARDIA)    Difficulty of Paying Living Expenses: Not very hard  Food Insecurity: No Food Insecurity (10/30/2022)   Hunger Vital Sign    Worried About Running Out of Food in the Last Year: Never true    Ran Out of Food in the Last Year: Never true  Transportation Needs: No Transportation Needs (10/30/2022)   PRAPARE - Hydrologist (Medical): No    Lack of Transportation (Non-Medical): No  Physical Activity: Inactive (01/14/2020)   Exercise Vital Sign    Days of Exercise per Week: 0 days    Minutes of Exercise per Session: 0 min  Stress: No Stress Concern Present (01/14/2020)   [place]    Feeling of Stress : Not at all  Social Connections: Moderately Isolated (01/14/2020)  Social Connection and Isolation Panel [NHANES]    Frequency of Communication with Friends and Family: Twice a week    Frequency of Social Gatherings with Friends and Family: Twice a week    Attends Religious Services: Never    Marine scientist or Organizations: No    Attends Music therapist: Never    Marital Status: Married   Family History  Problem Relation Age of Onset   Diabetes Mother    Heart attack Father    Scheduled Meds:  digoxin  125 mcg Oral Daily   metoprolol succinate  50 mg Oral Once   metoprolol succinate  50 mg Oral BID   metoprolol tartrate  2.5 mg Intravenous Once   thiamine  100 mg Oral Daily   Or   thiamine  100 mg Intravenous Daily   vancomycin  125 mg Oral QID   Continuous Infusions:  lactated ringers 75 mL/hr at 10/31/22 0606   sodium chloride irrigation     PRN Meds:.acetaminophen **OR** acetaminophen, morphine injection, ondansetron **OR** ondansetron (ZOFRAN) IV No Known Allergies CBC:    Component Value Date/Time   WBC 14.7 (H) 10/31/2022  0641   HGB 8.6 (L) 10/31/2022 0641   HCT 28.1 (L) 10/31/2022 0641   HCT 48 02/12/2016 1100   PLT 269 10/31/2022 0641   MCV 105.2 (H) 10/31/2022 0641   MCV 89.2 02/12/2016 1100   NEUTROABS 4.7 10/27/2022 2110   LYMPHSABS 0.8 10/27/2022 2110   MONOABS 0.7 10/27/2022 2110   EOSABS 0.0 10/27/2022 2110   BASOSABS 0.0 10/27/2022 2110   Comprehensive Metabolic Panel:    Component Value Date/Time   NA 142 10/31/2022 0641   NA 136 02/12/2016 1100   K 4.2 10/31/2022 0641   K 5.6 02/12/2016 1100   CL 117 (H) 10/31/2022 0641   CL 97 02/12/2016 1100   CO2 16 (L) 10/31/2022 0641   CO2 26 02/12/2016 1100   BUN 31 (H) 10/31/2022 0641   BUN 34 02/12/2016 1100   CREATININE 2.26 (H) 10/31/2022 0641   CREATININE 1.27 (H) 04/21/2016 0838   GLUCOSE 91 10/31/2022 0641   CALCIUM 8.2 (L) 10/31/2022 0641   CALCIUM 9.0 02/12/2016 1100   AST 41 10/27/2022 2110   AST 27 02/12/2016 1100   ALT 29 10/27/2022 2110   ALT 41 02/12/2016 1100   ALKPHOS 117 10/27/2022 2110   ALKPHOS 155 02/12/2016 1100   BILITOT 0.5 10/27/2022 2110   BILITOT 0.4 02/12/2016 1100   PROT 5.8 (L) 10/27/2022 2110   PROT 5.8 02/12/2016 1100   ALBUMIN 2.8 (L) 10/27/2022 2110   ALBUMIN 3.2 02/12/2016 1100    Physical Exam: Vital Signs: BP 124/88 (BP Location: Left Arm)   Pulse (!) 105   Temp (!) 97.5 F (36.4 C) (Axillary)   Resp 18   Ht '5\' 8"'$  (1.727 m)   Wt 56 kg   SpO2 98%   BMI 18.77 kg/m  SpO2: SpO2: 98 % O2 Device: O2 Device: Nasal Cannula O2 Flow Rate: O2 Flow Rate (L/min): 2 L/min Intake/output summary:  Intake/Output Summary (Last 24 hours) at 10/31/2022 1478 Last data filed at 10/31/2022 0700 Gross per 24 hour  Intake 7068.36 ml  Output 10825 ml  Net -3756.64 ml   LBM:   Baseline Weight: Weight: 56 kg Most recent weight: Weight: 56 kg  General: Sleeping, chronically ill-appearing, frail Eyes: No discharge noted HENT: Dry mucous membranes Cardiovascular: Cardia noted Respiratory: no increased work of  breathing noted, not in respiratory  distress Abdomen: not distended Extremities: Also wasting present in all extremities Skin: no rashes or lesions on visible skin Neuro: Sleeping          Palliative Performance Scale: 20%               Additional Data Reviewed: Recent Labs    10/29/22 1457 10/31/22 0641  WBC 6.7 14.7*  HGB 9.1* 8.6*  PLT 307 269  NA 141 142  BUN 24* 31*  CREATININE 1.89* 2.26*    Imaging: CT Head Wo Contrast CLINICAL DATA:  Mental status change, unknown cause  EXAM: CT HEAD WITHOUT CONTRAST  TECHNIQUE: Contiguous axial images were obtained from the base of the skull through the vertex without intravenous contrast.  RADIATION DOSE REDUCTION: This exam was performed according to the departmental dose-optimization program which includes automated exposure control, adjustment of the mA and/or kV according to patient size and/or use of iterative reconstruction technique.  COMPARISON:  10/12/2022  FINDINGS: Brain: No evidence of acute infarction, hemorrhage, hydrocephalus or, extra-axial collection. Stable calcified lesion in the fourth ventricle measuring up to 1.8 cm. Chronic lacunar infarcts in the bilateral basal ganglia. Patchy low-density changes within the periventricular and subcortical white matter compatible with chronic microvascular ischemic change. Moderate diffuse cerebral volume loss.  Vascular: Atherosclerotic calcifications involving the large vessels of the skull base. No unexpected hyperdense vessel.  Skull: Normal. Negative for fracture or focal lesion.  Sinuses/Orbits: Mild diffuse paranasal sinus mucosal thickening with air-fluid levels in the sphenoid sinuses. Partial bilateral mastoid effusions.  Other: None.  IMPRESSION: 1. No acute intracranial abnormality. 2. 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 No hydrocephalus. 3. Chronic microvascular ischemic change and cerebral volume loss. 4. Paranasal  sinus disease with air-fluid levels in the sphenoid sinuses. Correlate for acute sinusitis. 5. Partial bilateral mastoid effusions.  Electronically Signed   By: Davina Poke D.O.   On: 10/27/2022 22:00    I personally reviewed recent imaging.   Palliative Care Assessment and Plan Summary of Established Goals of Care and Medical Treatment Preferences   Patient is a 77 year old male with a past medical history of alcohol abuse, CHF, CAD, A-fib on Eliquis, COPD, and dementia who was admitted from SNF on 10/29/2022 for management of confusion and hematuria.  During admission patient was found to be RSV positive and C. difficile positive.  Urology has been going to assist with management of hematuria.  Patient has had continued issues during hospitalization with altered mental status.  Medicine team consulted to assist with complex medical decision making. Of note, patient seen by palliative medicine team in December 2023.  # Complex medical decision making/goals of care  -Patient unable to participate in complex medical decision-making due to medical status.  -Called patient's wife, Olney Monier, as described above in HPI.  [name] notes she and patient have been married 49 years.  Conversation was interesting with the wife as she herself had slowed responses and was unable to provide details regarding patient's quality of life.  Wife noted that patient never talked with her about goals for medical care moving forward if he was very ill.  Did express concern to wife that patient is very ill and has multiple medical conditions currently.  Wife agreeing to involvement of children, especially daughter [name], in conversations.  -Attempted to call patient's daughter, [name], though no answer on attempts.  -  Code Status: DNR   # Symptom management  -As per hospitalist  # Psycho-social/Spiritual Support:  - Support  System: Wife, daughters, son listed though as per prior palliative  medicine note daughter had stated son was not involved in patient's care  # Discharge Planning:  TBD  Thank you for allowing the palliative care team to participate in the care [name].  Chelsea Aus, DO Palliative Care Provider PMT # [phone]  If patient remains symptomatic despite maximum doses, please call PMT at [phone] between 0700 and 1900. Outside of these hours, please call attending, as PMT does not have night coverage.  This provider spent a total of 81 minutes providing patient's care.  Includes review of EMR, discussing care with other staff members involved in patient's medical care, obtaining relevant history and information from patient and/or patient's family, and personal review of imaging and lab work. Greater than 50% of the time was spent counseling and coordinating care related to the above assessment and plan.

## 2022-11-01 DIAGNOSIS — R52 Pain, unspecified: Secondary | ICD-10-CM

## 2022-11-01 DIAGNOSIS — Z79899 Other long term (current) drug therapy: Secondary | ICD-10-CM

## 2022-11-01 DIAGNOSIS — R310 Gross hematuria: Secondary | ICD-10-CM | POA: Diagnosis not present

## 2022-11-01 DIAGNOSIS — R451 Restlessness and agitation: Secondary | ICD-10-CM

## 2022-11-01 DIAGNOSIS — Z66 Do not resuscitate: Secondary | ICD-10-CM

## 2022-11-01 DIAGNOSIS — R0600 Dyspnea, unspecified: Secondary | ICD-10-CM

## 2022-11-01 LAB — BASIC METABOLIC PANEL
Anion gap: 7 (ref 5–15)
BUN: 34 mg/dL — ABNORMAL HIGH (ref 8–23)
CO2: 18 mmol/L — ABNORMAL LOW (ref 22–32)
Calcium: 8.2 mg/dL — ABNORMAL LOW (ref 8.9–10.3)
Chloride: 119 mmol/L — ABNORMAL HIGH (ref 98–111)
Creatinine, Ser: 2.24 mg/dL — ABNORMAL HIGH (ref 0.61–1.24)
GFR, Estimated: 29 mL/min — ABNORMAL LOW (ref >60)
Glucose, Bld: 96 mg/dL (ref 70–99)
Potassium: 4.4 mmol/L (ref 3.5–5.1)
Sodium: 144 mmol/L (ref 135–145)

## 2022-11-01 LAB — CBC
HCT: 27 % — ABNORMAL LOW (ref 39.0–52.0)
Hemoglobin: 8.4 g/dL — ABNORMAL LOW (ref 13.0–17.0)
MCH: 32.9 pg (ref 26.0–34.0)
MCHC: 31.1 g/dL (ref 30.0–36.0)
MCV: 105.9 fL — ABNORMAL HIGH (ref 80.0–100.0)
Platelets: 242 10*3/uL (ref 150–400)
RBC: 2.55 MIL/uL — ABNORMAL LOW (ref 4.22–5.81)
RDW: 15.2 % (ref 11.5–15.5)
WBC: 14.6 10*3/uL — ABNORMAL HIGH (ref 4.0–10.5)
nRBC: 0 % (ref 0.0–0.2)

## 2022-11-01 MED ORDER — BIOTENE DRY MOUTH MT LIQD: 15.0000 mL | OROMUCOSAL | Status: DC | PRN

## 2022-11-01 MED ORDER — MORPHINE SULFATE (CONCENTRATE) 10 MG/0.5ML PO SOLN: 2.0000 mg | ORAL | Status: DC | PRN

## 2022-11-01 MED ORDER — GLYCOPYRROLATE 0.2 MG/ML IJ SOLN: 0.2000 mg | INTRAMUSCULAR | Status: DC | PRN

## 2022-11-01 MED ORDER — POLYVINYL ALCOHOL 1.4 % OP SOLN: 1.0000 [drp] | Freq: Four times a day (QID) | OPHTHALMIC | Status: DC | PRN

## 2022-11-01 MED ORDER — HALOPERIDOL LACTATE 2 MG/ML PO CONC: 0.5000 mg | ORAL | Status: DC | PRN

## 2022-11-01 MED ORDER — CHLORHEXIDINE GLUCONATE CLOTH 2 % EX PADS
6.0000 | MEDICATED_PAD | Freq: Every day | CUTANEOUS | Status: DC
  Administered 2022-10-31 – 2022-11-03 (×4): 6 via TOPICAL

## 2022-11-01 MED ORDER — LORAZEPAM 2 MG/ML PO CONC: 1.0000 mg | ORAL | Status: DC | PRN

## 2022-11-01 MED ORDER — GLYCOPYRROLATE 1 MG PO TABS: 1.0000 mg | ORAL_TABLET | ORAL | Status: DC | PRN

## 2022-11-01 MED ORDER — HALOPERIDOL 0.5 MG PO TABS: 0.5000 mg | ORAL_TABLET | ORAL | Status: DC | PRN

## 2022-11-01 MED ORDER — LORAZEPAM 1 MG PO TABS: 1.0000 mg | ORAL_TABLET | ORAL | Status: DC | PRN

## 2022-11-01 MED ORDER — MORPHINE SULFATE (PF) 2 MG/ML IV SOLN
2.0000 mg | INTRAVENOUS | Status: DC | PRN
  Administered 2022-11-01 (×2): 2 mg via INTRAVENOUS
  Administered 2022-11-01: 4 mg via INTRAVENOUS
  Administered 2022-11-01: 2 mg via INTRAVENOUS
  Administered 2022-11-02: 4 mg via INTRAVENOUS
  Administered 2022-11-02: 2 mg via INTRAVENOUS
  Administered 2022-11-02 (×3): 4 mg via INTRAVENOUS
  Administered 2022-11-02: 2 mg via INTRAVENOUS
  Administered 2022-11-02: 3 mg via INTRAVENOUS
  Administered 2022-11-03 (×3): 4 mg via INTRAVENOUS
  Filled 2022-11-01: qty 2
  Filled 2022-11-01 (×3): qty 1
  Filled 2022-11-01 (×6): qty 2
  Filled 2022-11-01: qty 1
  Filled 2022-11-01 (×2): qty 2

## 2022-11-01 MED ORDER — HALOPERIDOL LACTATE 5 MG/ML IJ SOLN
0.5000 mg | INTRAMUSCULAR | Status: DC | PRN
  Administered 2022-11-02: 0.5 mg via INTRAVENOUS
  Filled 2022-11-01: qty 1

## 2022-11-01 NOTE — Progress Notes (Signed)
Urology Inpatient Progress Report  Gross hematuria [R31.0] Hematuria, unspecified type [R31.9]    Intv/Subj: No acute events overnight. Receiving morphine and levsin for bladder spasms Patient sleeping this AM Remains confused/delirious  Principal Problem:   Gross hematuria Active Problems:   CKD (chronic kidney disease) stage 3, GFR 30-59 ml/min (HCC)   Closed fracture of greater trochanter of right femur (HCC)   Paroxysmal atrial fibrillation (HCC)   Chronic combined systolic and diastolic heart failure (HCC)   Essential (primary) hypertension   Altered mental status   RSV exposure   C. difficile diarrhea   Palliative care encounter   Hematuria   AKI (acute kidney injury) ([place])   Goals of care, counseling/discussion   Counseling and coordination of care  Current Facility-Administered Medications  Medication Dose Route Frequency Provider Last Rate Last Admin   acetaminophen (TYLENOL) tablet 650 mg  650 mg Oral Q6H PRN Etta Quill, DO       Or   acetaminophen (TYLENOL) suppository 650 mg  650 mg Rectal Q6H PRN Etta Quill, DO       Chlorhexidine Gluconate Cloth 2 % PADS 6 each  6 each Topical Daily Shelda Pal, DO   6 each at 10/31/22 2030   hyoscyamine (LEVSIN SL) SL tablet 0.125 mg  0.125 mg Sublingual Q6H PRN Raynelle Bring, MD   0.125 mg at 10/31/22 0962   lactated ringers infusion   Intravenous Continuous Etta Quill, DO 75 mL/hr at 11/01/22 0621 New Bag at 11/01/22 0621   metoprolol tartrate (LOPRESSOR) injection 5 mg  5 mg Intravenous Q6H Shelda Pal, DO   5 mg at 11/01/22 0543   morphine (PF) 2 MG/ML injection 2-4 mg  2-4 mg Intravenous Q2H PRN Etta Quill, DO   4 mg at 11/01/22 0543   ondansetron (ZOFRAN) tablet 4 mg  4 mg Oral Q6H PRN Etta Quill, DO       Or   ondansetron [facility]) injection 4 mg  4 mg Intravenous Q6H PRN Etta Quill, DO       sodium chloride irrigation 0.9 % 3,000 mL  3,000 mL Irrigation  Continuous Fransico Meadow, MD   3,000 mL at 10/30/22 1819   thiamine (VITAMIN B1) tablet 100 mg  100 mg Oral Daily Raenette Rover, NP       Or   thiamine (VITAMIN B1) injection 100 mg  100 mg Intravenous Daily Raenette Rover, NP   100 mg at 10/31/22 1354   vancomycin (VANCOCIN) 500 mg in sodium chloride irrigation 0.9 % 100 mL ENEMA  500 mg Rectal Q6H Shelda Pal, DO   500 mg at 11/01/22 0624     Objective: Vital: Vitals:   10/31/22 2046 10/31/22 2048 11/01/22 0045 11/01/22 0437  BP: 103/70 103/70 (!) 137/95 137/72  Pulse: (!) 120 (!) 121 84 95  Resp: '20 20 20 20  '$ Temp: 97.9 F (36.6 C) 97.9 F (36.6 C) (!) 97.5 F (36.4 C) 97.7 F (36.5 C)  TempSrc: Axillary Oral Axillary Oral  SpO2: 97%  100% 100%  Weight:      Height:       I/Os: I/O last 3 completed shifts: In: 2561 [I.V.:2511; Other:50] Out: 8366 [QHUTM:5465]  Physical Exam:  General: Patient is in no apparent distress Lungs: Normal respiratory effort, chest expands symmetrically. GI:The abdomen is soft and nontender without mass. Foley: straw colored off CBI  Ext: lower extremities symmetric  Lab Results: Recent Labs    10/29/22  1457 10/31/22 0641 11/01/22 0453  WBC 6.7 14.7* 14.6*  HGB 9.1* 8.6* 8.4*  HCT 28.8* 28.1* 27.0*   Recent Labs    10/29/22 1457 10/31/22 0641 11/01/22 0453  NA 141 142 144  K 4.3 4.2 4.4  CL 117* 117* 119*  CO2 21* 16* 18*  GLUCOSE 103* 91 96  BUN 24* 31* 34*  CREATININE 1.89* 2.26* 2.24*  CALCIUM 8.2* 8.2* 8.2*   No results for input(s): "LABPT", "INR" in the last 72 hours. No results for input(s): "LABURIN" in the last 72 hours. Results for orders placed or performed during the hospital encounter of 10/29/22  Urine Culture     Status: None   Collection Time: 10/29/22  4:12 PM   Specimen: Urine, Clean Catch  Result Value Ref Range Status   Specimen Description   Final    URINE, CLEAN CATCH Performed at [facility], [address] [place],  [place] [address]    Special Requests   Final    NONE Performed at [facility], [address], [place], [place] [address]    Culture   Final    NO GROWTH Performed at [facility], [place] [address], [place], [place] [address]    Report Status 10/31/2022 FINAL  Final    Studies/Results: No results found.  Assessment: Hematuria resolved - suspect prostatitic in origin, but will need full eval at some point in the near future if things turn around for him.  Plan: Okay to remove foley.  I have not written order in the event that it is necessary to more closely monitor output or if it is helpful from palliative prospective.  Will leave decision to hospitalist.  Please page me for further issues/concerns.  I will leave information on discharge information for patient/family to contact us for further eval upon discharge.   Louis Meckel, MD Urology 11/01/2022, 7:33 AM

## 2022-11-01 NOTE — Progress Notes (Signed)
PROGRESS NOTE    Ricardo Garrett  BJS:[id number] DOB: [birth year] DOA: 10/29/2022 PCP: [name], MD     Brief Narrative:  77 year old white male with a history of alcohol abuse, CHF, CAD, A-fib on Eliquis, COPD, and dementia presenting from SNF for confusion and hematuria.  He was also found to be RSV positive and C. difficile positive.  He had 4 hours of CBI in the ER without resolution.  Urology was consulted and used a different catheter with success.  Urologic workup needed.   New events last 24 hours / Subjective: Patient remains obtunded. Palliative care involved, family meeting today, decision made to transition to full comfort care, hospice consulted.  Assessment & Plan:   End-of-life care  Palliative consulted, after family meeting today decision made to transition to full comfort care. Since breaking hip a month ago has has been non-verbal, hasn't been able to get out of bed, has made no meaningful improvement in one month, limited PO.  Hospital problems   Gross hematuria             Resolved, will maintain foley for comfort  RSV infection             present on admission     C. difficile diarrhea             had been treating w/ rectal vancomycin given inability to take PO    Alcohol abuse             B12 daily     Unlikely he is currently abusing as he came from SNF     CKD (chronic kidney disease) stage 3, GFR 30-59 ml/min (HCC)             stable     Recent closed fracture of greater trochanter of right femur ([place])             treated non-operatively     Paroxysmal atrial fibrillation (HCC)     Chronic combined systolic and diastolic heart failure ([place])               Essential (primary) hypertension     DVT prophylaxis: SCDs Code Status: DNR Family Communication: wife and daughters updated @ bedside Coming From: SNF Disposition Plan: TBD, possible inpatient hospice Barriers to Discharge: unsafe d/c plan  Consultants:   Urology  Antimicrobials:  Anti-infectives (From admission, onward)    Start     Dose/Rate Route Frequency Ordered Stop   10/31/22 1200  vancomycin (VANCOCIN) 500 mg in sodium chloride irrigation 0.9 % 100 mL ENEMA  Status:  Discontinued        500 mg Rectal Every 6 hours 10/31/22 1030 11/01/22 1212   10/30/22 0215  vancomycin (VANCOCIN) capsule 125 mg  Status:  Discontinued        125 mg Oral 4 times daily 10/30/22 0207 10/31/22 1031   10/30/22 0015  vancomycin (VANCOCIN) 50 mg/mL oral solution SOLN 125 mg  Status:  Discontinued        125 mg Oral 4 times daily 10/30/22 0010 10/30/22 0207   10/29/22 2300  fidaxomicin (DIFICID) tablet 200 mg  Status:  Discontinued        200 mg Oral 2 times daily 10/29/22 2253 10/29/22 2256   10/29/22 2300  vancomycin (VANCOCIN) capsule 125 mg  Status:  Discontinued        125 mg Oral 4 times daily 10/29/22 2256 10/30/22 0010   10/29/22 1615  cefTRIAXone (ROCEPHIN) 1  g in sodium chloride 0.9 % 100 mL IVPB        1 g 200 mL/hr over 30 Minutes Intravenous STAT 10/29/22 1612 10/29/22 1727        Objective: Vitals:   10/31/22 2048 11/01/22 0045 11/01/22 0437 11/01/22 0927  BP: 103/70 (!) 137/95 137/72 116/67  Pulse: (!) 121 84 95 85  Resp: '20 20 20 16  '$ Temp: 97.9 F (36.6 C) (!) 97.5 F (36.4 C) 97.7 F (36.5 C) (!) 97.2 F (36.2 C)  TempSrc: Oral Axillary Oral Axillary  SpO2:  100% 100% 100%  Weight:      Height:        Intake/Output Summary (Last 24 hours) at 11/01/2022 1531 Last data filed at 11/01/2022 0300 Gross per 24 hour  Intake 1492.66 ml  Output 450 ml  Net 1042.66 ml   Filed Weights   10/29/22 1212  Weight: 56 kg    Examination:  General exam: obtunded Respiratory system: normal wob  Cardiovascular system: S1 & S2 heard, irregularly irregular rhythm Gastrointestinal system: Abdomen is nondistended, no apparent tenderness Central nervous system: moving all 4 Extremities: Symmetric in appearance  Skin: No rashes, lesions  or ulcers on exposed skin  Psychiatry: Unable to assess  Data Reviewed: I have personally reviewed following labs and imaging studies  CBC: Recent Labs  Lab 10/27/22 2110 10/29/22 1457 10/30/22 0613 10/31/22 0641 11/01/22 0453  WBC 6.3 6.7 13.7* 14.7* 14.6*  NEUTROABS 4.7  --   --   --   --   HGB 9.4* 9.1* 8.7* 8.6* 8.4*  HCT 29.4* 28.8* 27.6* 28.1* 27.0*  MCV 102.4* 103.6* 104.9* 105.2* 105.9*  PLT 319 307 298 269 825   Basic Metabolic Panel: Recent Labs  Lab 10/27/22 2110 10/29/22 1457 10/30/22 0613 10/31/22 0641 11/01/22 0453  NA 139 141 139 142 144  K 3.9 4.3 4.2 4.2 4.4  CL 114* 117* 116* 117* 119*  CO2 20* 21* 17* 16* 18*  GLUCOSE 99 103* 85 91 96  BUN 23 24* 26* 31* 34*  CREATININE 1.94* 1.89* 1.96* 2.26* 2.24*  CALCIUM 7.6* 8.2* 7.9* 8.2* 8.2*  MG 1.7  --   --   --   --    GFR: Estimated Creatinine Clearance: 21.5 mL/min (A) (by C-G formula based on SCr of 2.24 mg/dL (H)).  Liver Function Tests: Recent Labs  Lab 10/27/22 2110  AST 41  ALT 29  ALKPHOS 117  BILITOT 0.5  PROT 5.8*  ALBUMIN 2.8*   Recent Labs  Lab 10/27/22 2110  AMMONIA <10   Recent Results (from the past 240 hour(s))  Urine Culture     Status: None   Collection Time: 10/27/22  8:31 PM   Specimen: Urine, Clean Catch  Result Value Ref Range Status   Specimen Description   Final    URINE, CLEAN CATCH Performed at [facility], [address], [place], [place] [address]    Special Requests   Final    NONE Performed at [facility], [address], [place], [place] [address]    Culture   Final    NO GROWTH Performed at [facility], [place] [address] [place], [place] [address]    Report Status 10/29/2022 FINAL  Final  C Difficile Quick Screen w PCR reflex     Status: Abnormal   Collection Time: 10/27/22  8:35 PM   Specimen: Urine, Clean Catch; Stool  Result Value Ref Range Status   C Diff antigen POSITIVE (A)  NEGATIVE Final   C Diff toxin POSITIVE (A) NEGATIVE Final    C Diff interpretation Toxin producing C. difficile detected.  Final    Comment: CRITICAL RESULT CALLED TO, READ BACK BY AND VERIFIED WITH: MOSTELLER,M. '@0116'$  ON 10/28/22 BY [facility] Performed at [facility], [address] [place], [place] [address]   Resp panel by RT-PCR (RSV, Flu A&B, Covid) Anterior Nasal Swab     Status: Abnormal   Collection Time: 10/27/22  9:00 PM   Specimen: Anterior Nasal Swab  Result Value Ref Range Status   SARS Coronavirus 2 by RT PCR NEGATIVE NEGATIVE Final    Comment: (NOTE) SARS-CoV-2 target nucleic acids are NOT DETECTED.  The SARS-CoV-2 RNA is generally detectable in upper respiratory specimens during the acute phase of infection. The lowest concentration of SARS-CoV-2 viral copies this assay can detect is 138 copies/mL. A negative result does not preclude SARS-Cov-2 infection and should not be used as the sole basis for treatment or other patient management decisions. A negative result may occur with  improper specimen collection/handling, submission of specimen other than nasopharyngeal swab, presence of viral mutation(s) within the areas targeted by this assay, and inadequate number of viral copies(<138 copies/mL). A negative result must be combined with clinical observations, patient history, and epidemiological information. The expected result is Negative.  Fact Sheet for Patients:  EntrepreneurPulse.com.au  Fact Sheet for Healthcare Providers:  IncredibleEmployment.be  This test is no t yet approved or cleared by the [place] FDA and  has been authorized for detection and/or diagnosis of SARS-CoV-2 by FDA under an Emergency Use Authorization (EUA). This EUA will remain  in effect (meaning this test can be used) for the duration of the COVID-19 declaration under Section 564(b)(1) of the Act, 21 U.S.C.section 360bbb-3(b)(1), unless the authorization is terminated  or revoked sooner.       Influenza A  by PCR NEGATIVE NEGATIVE Final   Influenza B by PCR NEGATIVE NEGATIVE Final    Comment: (NOTE) The Xpert Xpress SARS-CoV-2/FLU/RSV plus assay is intended as an aid in the diagnosis of influenza from Nasopharyngeal swab specimens and should not be used as a sole basis for treatment. Nasal washings and aspirates are unacceptable for Xpert Xpress SARS-CoV-2/FLU/RSV testing.  Fact Sheet for Patients: EntrepreneurPulse.com.au  Fact Sheet for Healthcare Providers: IncredibleEmployment.be  This test is not yet approved or cleared by the [place] FDA and has been authorized for detection and/or diagnosis of SARS-CoV-2 by FDA under an Emergency Use Authorization (EUA). This EUA will remain in effect (meaning this test can be used) for the duration of the COVID-19 declaration under Section 564(b)(1) of the Act, 21 U.S.C. section 360bbb-3(b)(1), unless the authorization is terminated or revoked.     Resp Syncytial Virus by PCR POSITIVE (A) NEGATIVE Final    Comment: (NOTE) Fact Sheet for Patients: EntrepreneurPulse.com.au  Fact Sheet for Healthcare Providers: IncredibleEmployment.be  This test is not yet approved or cleared by the [place] FDA and has been authorized for detection and/or diagnosis of SARS-CoV-2 by FDA under an Emergency Use Authorization (EUA). This EUA will remain in effect (meaning this test can be used) for the duration of the COVID-19 declaration under Section 564(b)(1) of the Act, 21 U.S.C. section 360bbb-3(b)(1), unless the authorization is terminated or revoked.  Performed at [facility], [address] [place], [place] [address]   Urine Culture     Status: None   Collection Time: 10/29/22  4:12 PM   Specimen: Urine, Clean Catch  Result  Value Ref Range Status   Specimen Description   Final    URINE, CLEAN CATCH Performed at [facility], [address], [place], [place]  [address]    Special Requests   Final    NONE Performed at [facility], [address], [place], [place] [address]    Culture   Final    NO GROWTH Performed at [facility], [place] [address], [place], [place] [address]    Report Status 10/31/2022 FINAL  Final     Scheduled Meds:  Chlorhexidine Gluconate Cloth  6 each Topical Daily   Continuous Infusions:  sodium chloride irrigation       LOS: 2 days    Time spent: 35 minutes   Desma Maxim, MD [facility] 11/01/2022, 3:31 PM   Available via Epic secure chat 7am-7pm After these hours, please refer to coverage provider listed on [email or web address]

## 2022-11-01 NOTE — Progress Notes (Signed)
Chaplain engaged in an initial visit with Swan' daughter, [name], by phone.  Chaplain provided space for [name] to process her grief.  She tearfully expressed that she doesn't want her daddy to leave but she also brought up that Uzair had been telling them that his time would be coming to an end soon.  Chaplain uplifted Dani' readiness and the ways he has been working to prepare his family.  [name] is holding space for that in the midst of wanting her dad to be well.  [name] also has been wearing many hats and has not had time to properly breathe in everything that is happening.  She is a caregiver at work and took on the role of being a caregiver to her parent.  Chaplain could assess that she is overwhelmed.  She is looking forward to taking time off work.  Chaplain offered supportive presence and listening.  Chaplain affirmed Cheryl's love for her dad and the ways she has shown up for her parents.     11/01/22 1700  Clinical Encounter Type  Visited With Family  Visit Type Initial;Spiritual support

## 2022-11-01 NOTE — Progress Notes (Signed)
Daily Progress Note   Patient Name: Ricardo Garrett       Date: 11/01/2022 DOB: [birth year]  Age: 77 y.o. MRN#: [id number] Attending Physician: Gwynne Edinger, MD Primary Care Physician: [name], MD Admit Date: 10/29/2022 Length of Stay: 2 days  Reason for Consultation/Follow-up: Establishing goals of care and Symptom Management   Subjective:   CC: Patient unable to participate in complex medical decision-making due to medical status.  Discussed complex medical decision making with daughters and wife today.  Subjective:  Extensive review of EMR prior to seeing patient.  Also discussed care with hospitalist who had followed up with family this morning regarding patient's multiple medical concerns.  Presented to bedside and introduced myself as a member of the palliative medicine team.  Able to speak with patient's wife and 2 daughters outside of room as patient was agitated and unable to participate in conversation.  Discussed my role as the palliative medicine provider.  Family able to update me about their discussion with hospitalist.  All family present expressed knowing patient is not doing well and that there is likely not going to be a reversible cause to his underlying mental status while here in the hospital.  We discussed then goals for medical care moving forward.  Discussed transition to comfort focused care and what this would and would not entail.  Family all agreeing on transitioning to comfort focused care at this time.  I discussed with Ricardo Garrett about hospice yesterday though took time to explain to family what hospice would and would not provide.  Described different settings for hospice care.  Ricardo Garrett had stated patient was adamant that he would want to die at home with family.  Ricardo Garrett is willing to take off time to support patient's care at home.  Ricardo Garrett is unsure if care needs can be supported at home.  They would appreciate further discussions with social worker regarding  sources and options for hospice agencies so a hospice liaison could discuss care with them further.  Ricardo Garrett did speak with lawyer who noted that patient did not complete documentation about healthcare wishes.  [name] has her own medical concerns and during discussion defers that medical decisions regarding patient's care should be made by patient's daughter Ricardo Garrett and Ricardo Garrett.  Supported wife's wishes and noted that daughters could speak on patient's behalf since she is agreeing with this.  All questions answered at that time.  Provided emotional support as able.  Updated care team after visit.  Review of Systems  Objective:   Vital Signs:  BP 137/72 (BP Location: Right Leg)   Pulse 95   Temp 97.7 F (36.5 C) (Oral)   Resp 20   Ht '5\' 8"'$  (1.727 m)   Wt 56 kg   SpO2 100%   BMI 18.77 kg/m   Physical Exam: General: confused, chronically ill-appearing, frail Eyes: No discharge noted HENT: Dry mucous membranes Cardiovascular: RRR Respiratory: no increased work of breathing noted, not in respiratory distress Abdomen: not distended Extremities: muscle wasting present in all extremities Skin: no rashes or lesions on visible skin Neuro: confused  Imaging:  I personally reviewed recent imaging.   Assessment & Plan:   Assessment: Patient is a 77 year old male with a past medical history of alcohol abuse, CHF, CAD, A-fib on Eliquis, COPD, and dementia who was admitted from SNF on 10/29/2022 for management of confusion and hematuria.  During admission patient was found to be RSV positive and C. difficile positive.  Urology has been going  to assist with management of hematuria.  Patient has had continued issues during hospitalization with altered mental status.  Medicine team consulted to assist with complex medical decision making. Of note, patient seen by palliative medicine team in December 2023.   # Complex medical decision making/goals of care  -Patient unable to participate  in medical decision making secondary to his mental status.  -Spoke with patient's wife and 2 daughters as described in detail above in HPI.  All family agreeing with transition to comfort focused care at this time.  -At this time we will discontinue interventions that are no longer focused on comfort such as IV fluids, imaging, or lab work.  Will instead focus on symptom management of pain, dyspnea, and agitation in the setting of end-of-life care.  -[name] stated that she would want her daughters, Ricardo Garrett and Ricardo Garrett, to make medical decisions on behalf of the patient since he is unable to participate in complex medical decision making.  Wife defers her decision making capabilities for the patient to them.  -Informed TOC of family's wishes to proceed with hospice care.  Consult for TOC placed.  Appreciate their assistance with coordination of care.  - Code Status: DNR   # Symptom management  Family has stated wishes for potentially getting patient home with hospice.  In light of this would asked that p.o./sublingual medications to be provided prior to any IV medications.  IV medication should be used for breakthrough to p.o./sublingual.     -Pain/Dyspnea, acute in the setting of end-of-life care                               -Start sublingual/oral morphine solution 2-3 mg every 4 hours as needed.  Continue IV morphine 2-4 mg every 2 hours as needed for breakthrough management after oral medications have been given.  Continue to adjust based on patient's symptom burden.                    -Anxiety/agitation, in the setting of end-of-life care                               -Started po/SL Ativan 1 mg every 4 hours as needed. Continue to adjust based on patient's symptom burden.                                 -And also has po/SL/IV Haldol 0.5 mg every 4 hours as needed. Continue to adjust based on patient's symptom burden.                   -Secretions, in the setting of end-of-life care                                -Started po/SL/IV glycopyrrolate as needed.  # Psychosocial Support:  -wife, 2 daughters  # Discharge Planning: Transitioning to comfort focused care at this time.  Daughter, Ricardo Garrett, hopeful to get patient home with hospice support as patient had previously stated he would want to die at home.  TOC referral placed to assist with hospice coordination.  Family would greatly appreciate further information from hospice liaison once agency chosen.  Discussed with: Hospitalist, TOC, bedside RN, wife, daughters  Thank you for allowing  the palliative care team to participate in the care [name].  Chelsea Aus, DO Palliative Care Provider PMT # [phone]  If patient remains symptomatic despite maximum doses, please call PMT at [phone] between 0700 and 1900. Outside of these hours, please call attending, as PMT does not have night coverage.

## 2022-11-02 DIAGNOSIS — E46 Unspecified protein-calorie malnutrition: Secondary | ICD-10-CM | POA: Diagnosis not present

## 2022-11-02 DIAGNOSIS — R310 Gross hematuria: Secondary | ICD-10-CM | POA: Diagnosis not present

## 2022-11-02 DIAGNOSIS — R634 Abnormal weight loss: Secondary | ICD-10-CM | POA: Diagnosis not present

## 2022-11-02 LAB — CBC
HCT: 29.8 % — ABNORMAL LOW (ref 39.0–52.0)
Hemoglobin: 9.1 g/dL — ABNORMAL LOW (ref 13.0–17.0)
MCH: 32.9 pg (ref 26.0–34.0)
MCHC: 30.5 g/dL (ref 30.0–36.0)
MCV: 107.6 fL — ABNORMAL HIGH (ref 80.0–100.0)
Platelets: 259 10*3/uL (ref 150–400)
RBC: 2.77 MIL/uL — ABNORMAL LOW (ref 4.22–5.81)
RDW: 15.5 % (ref 11.5–15.5)
WBC: 16.5 10*3/uL — ABNORMAL HIGH (ref 4.0–10.5)
nRBC: 0 % (ref 0.0–0.2)

## 2022-11-02 MED ORDER — LORAZEPAM 2 MG/ML IJ SOLN: 0.5000 mg | INTRAMUSCULAR | Status: DC | PRN

## 2022-11-02 NOTE — Progress Notes (Signed)
PROGRESS NOTE Ricardo Garrett  PTW:[id number] DOB: June 23, 1946 DOA: 10/29/2022 PCP: [name], MD   Brief Narrative/Hospital Course: 77 year old white male with a history of alcohol abuse, CHF, CAD, A-fib on Eliquis, COPD, and dementia presenting from SNF for confusion and hematuria.  He was also found to be RSV positive and C. difficile positive.  He had 4 hours of CBI in the ER without resolution.  Urology was consulted and used a different catheter with success    Subjective: Seen and examined.  He is alert awake hard of hearing Appears confused. Assessment and Plan: Principal Problem:   Gross hematuria Active Problems:   Altered mental status   CKD (chronic kidney disease) stage 3, GFR 30-59 ml/min (HCC)   Closed fracture of greater trochanter of right femur (HCC)   RSV exposure   C. difficile diarrhea   Paroxysmal atrial fibrillation (HCC)   Chronic combined systolic and diastolic heart failure (HCC)   Essential (primary) hypertension   Palliative care encounter   Hematuria   AKI (acute kidney injury) ([place])   Goals of care, counseling/discussion   Counseling and coordination of care   High risk medication use   Pain   Dyspnea   Agitation   DNR (do not resuscitate)  End-of-life care:After extensive discussion with palliative care and family transition to full comfort care.  Nonverbal, has not been able to get out of the bed without meaningful improvement in 1 month since his fracture.  Gross hematuria resolved Foley in place RSV infection CDIFF diarrhea managed with rectal vancomycin given inability to take p.o. Alcohol abuse:B12 daily CKD (chronic kidney disease) stage 3B VS ckd iv: creat at 1.9-2.2 Recent closed fracture of greater trochanter of right femur, treated non-operatively Paroxysmal atrial fibrillation Chronic combined systolic and diastolic heart failure  Essential hypertension  DVT prophylaxis:  Code Status:   Code Status: DNR Family  Communication: plan of care discussed with patient at bedside. Patient status is: inpatient because of comfort care Level of care: Telemetry   Dispo: The patient is from: snf            Anticipated disposition: TBD Objective: Vitals last 24 hrs: Vitals:   11/01/22 0045 11/01/22 0437 11/01/22 0927 11/02/22 0731  BP: (!) 137/95 137/72 116/67 103/78  Pulse: 84 95 85 100  Resp: '20 20 16 15  '$ Temp: (!) 97.5 F (36.4 C) 97.7 F (36.5 C) (!) 97.2 F (36.2 C) 98.2 F (36.8 C)  TempSrc: Axillary Oral Axillary Oral  SpO2: 100% 100% 100% 97%  Weight:      Height:       Weight change:   Physical Examination: General exam: alert awake,hard of hearing older than stated age HEENT:Oral mucosa moist, Ear/Nose WNL grossly Respiratory system: bilaterally clear BS,no use of accessory muscle Cardiovascular system: S1 & S2 +, No JVD. Gastrointestinal system: Abdomen soft,NT,ND, BS+ Nervous System:Alert, awake, moving extremities. Extremities: LE edema neg,distal peripheral pulses palpable.  Skin: No rashes,no icterus. MSK: Normal muscle bulk,tone, power  Medications reviewed:  Scheduled Meds:  Chlorhexidine Gluconate Cloth  6 each Topical Daily  Continuous Infusions:  sodium chloride irrigation      Diet Order             Diet Heart Room service appropriate? Yes; Fluid consistency: Thin  Diet effective now                  Intake/Output Summary (Last 24 hours) at 11/02/2022 7001 Last data filed at 11/01/2022 2200 Gross  per 24 hour  Intake 0 ml  Output --  Net 0 ml   Net IO Since Admission: -8,888.98 mL [11/02/22 0907]  Wt Readings from Last 3 Encounters:  10/29/22 56 kg  10/27/22 56 kg  10/08/22 56.7 kg   Unresulted Labs (From admission, onward)     Start     Ordered   10/31/22 0500  CBC  Daily,   R      10/30/22 1323          Data Reviewed: I have personally reviewed following labs and imaging studies CBC: Recent Labs  Lab 10/27/22 2110 10/29/22 1457  10/30/22 0613 10/31/22 0641 11/01/22 0453 11/02/22 0532  WBC 6.3 6.7 13.7* 14.7* 14.6* 16.5*  NEUTROABS 4.7  --   --   --   --   --   HGB 9.4* 9.1* 8.7* 8.6* 8.4* 9.1*  HCT 29.4* 28.8* 27.6* 28.1* 27.0* 29.8*  MCV 102.4* 103.6* 104.9* 105.2* 105.9* 107.6*  PLT 319 307 298 269 242 470   Basic Metabolic Panel: Recent Labs  Lab 10/27/22 2110 10/29/22 1457 10/30/22 0613 10/31/22 0641 11/01/22 0453  NA 139 141 139 142 144  K 3.9 4.3 4.2 4.2 4.4  CL 114* 117* 116* 117* 119*  CO2 20* 21* 17* 16* 18*  GLUCOSE 99 103* 85 91 96  BUN 23 24* 26* 31* 34*  CREATININE 1.94* 1.89* 1.96* 2.26* 2.24*  CALCIUM 7.6* 8.2* 7.9* 8.2* 8.2*  MG 1.7  --   --   --   --    GFR: Estimated Creatinine Clearance: 21.5 mL/min (A) (by C-G formula based on SCr of 2.24 mg/dL (H)). Liver Function Tests: Recent Labs  Lab 10/27/22 2110  AST 41  ALT 29  ALKPHOS 117  BILITOT 0.5  PROT 5.8*  ALBUMIN 2.8*   No results for input(s): "LIPASE", "AMYLASE" in the last 168 hours. Recent Labs  Lab 10/27/22 2110  AMMONIA <10   Recent Results (from the past 240 hour(s))  Urine Culture     Status: None   Collection Time: 10/27/22  8:31 PM   Specimen: Urine, Clean Catch  Result Value Ref Range Status   Specimen Description   Final    URINE, CLEAN CATCH Performed at [facility], [address], [place], [place] [address]    Special Requests   Final    NONE Performed at [facility], [address] [place], [place] [address]    Culture   Final    NO GROWTH Performed at [facility], [place] [address] [place], [place] [address]    Report Status 10/29/2022 FINAL  Final  C Difficile Quick Screen w PCR reflex     Status: Abnormal   Collection Time: 10/27/22  8:35 PM   Specimen: Urine, Clean Catch; Stool  Result Value Ref Range Status   C Diff antigen POSITIVE (A) NEGATIVE Final   C Diff toxin POSITIVE (A) NEGATIVE Final   C Diff interpretation Toxin producing C. difficile detected.  Final     Comment: CRITICAL RESULT CALLED TO, READ BACK BY AND VERIFIED WITH: MOSTELLER,M. '@0116'$  ON 10/28/22 BY [facility] Performed at [facility], [address] [place], [place] [address]   Resp panel by RT-PCR (RSV, Flu A&B, Covid) Anterior Nasal Swab     Status: Abnormal   Collection Time: 10/27/22  9:00 PM   Specimen: Anterior Nasal Swab  Result Value Ref Range Status   SARS Coronavirus 2 by RT PCR NEGATIVE NEGATIVE Final  Comment: (NOTE) SARS-CoV-2 target nucleic acids are NOT DETECTED.  The SARS-CoV-2 RNA is generally detectable in upper respiratory specimens during the acute phase of infection. The lowest concentration of SARS-CoV-2 viral copies this assay can detect is 138 copies/mL. A negative result does not preclude SARS-Cov-2 infection and should not be used as the sole basis for treatment or other patient management decisions. A negative result may occur with  improper specimen collection/handling, submission of specimen other than nasopharyngeal swab, presence of viral mutation(s) within the areas targeted by this assay, and inadequate number of viral copies(<138 copies/mL). A negative result must be combined with clinical observations, patient history, and epidemiological information. The expected result is Negative.  Fact Sheet for Patients:  EntrepreneurPulse.com.au  Fact Sheet for Healthcare Providers:  IncredibleEmployment.be  This test is no t yet approved or cleared by the [place] FDA and  has been authorized for detection and/or diagnosis of SARS-CoV-2 by FDA under an Emergency Use Authorization (EUA). This EUA will remain  in effect (meaning this test can be used) for the duration of the COVID-19 declaration under Section 564(b)(1) of the Act, 21 U.S.C.section 360bbb-3(b)(1), unless the authorization is terminated  or revoked sooner.       Influenza A by PCR NEGATIVE NEGATIVE Final   Influenza B by PCR NEGATIVE NEGATIVE  Final    Comment: (NOTE) The Xpert Xpress SARS-CoV-2/FLU/RSV plus assay is intended as an aid in the diagnosis of influenza from Nasopharyngeal swab specimens and should not be used as a sole basis for treatment. Nasal washings and aspirates are unacceptable for Xpert Xpress SARS-CoV-2/FLU/RSV testing.  Fact Sheet for Patients: EntrepreneurPulse.com.au  Fact Sheet for Healthcare Providers: IncredibleEmployment.be  This test is not yet approved or cleared by the [place] FDA and has been authorized for detection and/or diagnosis of SARS-CoV-2 by FDA under an Emergency Use Authorization (EUA). This EUA will remain in effect (meaning this test can be used) for the duration of the COVID-19 declaration under Section 564(b)(1) of the Act, 21 U.S.C. section 360bbb-3(b)(1), unless the authorization is terminated or revoked.     Resp Syncytial Virus by PCR POSITIVE (A) NEGATIVE Final    Comment: (NOTE) Fact Sheet for Patients: EntrepreneurPulse.com.au  Fact Sheet for Healthcare Providers: IncredibleEmployment.be  This test is not yet approved or cleared by the [place] FDA and has been authorized for detection and/or diagnosis of SARS-CoV-2 by FDA under an Emergency Use Authorization (EUA). This EUA will remain in effect (meaning this test can be used) for the duration of the COVID-19 declaration under Section 564(b)(1) of the Act, 21 U.S.C. section 360bbb-3(b)(1), unless the authorization is terminated or revoked.  Performed at [facility], [address], [place], [place] [address]   Urine Culture     Status: None   Collection Time: 10/29/22  4:12 PM   Specimen: Urine, Clean Catch  Result Value Ref Range Status   Specimen Description   Final    URINE, CLEAN CATCH Performed at [facility], [address] [place], [place] [address]    Special Requests   Final    NONE Performed at [facility], [address] [place], [place] [address]    Culture   Final    NO GROWTH Performed at [facility], [place] [address] [place], [place] [address]    Report Status 10/31/2022 FINAL  Final    Antimicrobials: Anti-infectives (From admission, onward)    Start     Dose/Rate Route Frequency Ordered Stop  10/31/22 1200  vancomycin (VANCOCIN) 500 mg in sodium chloride irrigation 0.9 % 100 mL ENEMA  Status:  Discontinued        500 mg Rectal Every 6 hours 10/31/22 1030 11/01/22 1212   10/30/22 0215  vancomycin (VANCOCIN) capsule 125 mg  Status:  Discontinued        125 mg Oral 4 times daily 10/30/22 0207 10/31/22 1031   10/30/22 0015  vancomycin (VANCOCIN) 50 mg/mL oral solution SOLN 125 mg  Status:  Discontinued        125 mg Oral 4 times daily 10/30/22 0010 10/30/22 0207   10/29/22 2300  fidaxomicin (DIFICID) tablet 200 mg  Status:  Discontinued        200 mg Oral 2 times daily 10/29/22 2253 10/29/22 2256   10/29/22 2300  vancomycin (VANCOCIN) capsule 125 mg  Status:  Discontinued        125 mg Oral 4 times daily 10/29/22 2256 10/30/22 0010   10/29/22 1615  cefTRIAXone (ROCEPHIN) 1 g in sodium chloride 0.9 % 100 mL IVPB        1 g 200 mL/hr over 30 Minutes Intravenous STAT 10/29/22 1612 10/29/22 1727      Culture/Microbiology    Component Value Date/Time   SDES  10/29/2022 1612    URINE, CLEAN CATCH Performed at [facility], [address], [place], [place] [address]    [facility]  10/29/2022 1612    NONE Performed at [facility], [address], [place], [place] [address]    [place]  10/29/2022 1612    NO GROWTH Performed at [place] [address] [place], [place] [address]    REPTSTATUS 10/31/2022 FINAL 10/29/2022 1612    Other culture-see note  Radiology Studies: No results found.   LOS: 3 days   Antonieta Pert, MD [facility]  11/02/2022, 9:07 AM   1

## 2022-11-02 NOTE — Consult Note (Addendum)
   [facility] Inpatient Consult   11/02/2022  RAAHIM SHARTZER [birth year] [id number]  [place] [facility] [ACO] Patient: Ricardo Garrett Medicare  Primary Care Provider:  [name], MD  *[facility] Liaison coverage for patient at [facility]  Patient was reviewed for less than 30 days readmission extreme high risk score   If the patient goes to a [facility] affiliated facility then, patient can be followed by [facility] PAC RN with traditional Medicare and approved Medicare Advantage plans.   Patient's electronic medical record reveals patient is going for comfort measures or to [facility]. Patient noted to be [facility] and have some confusion per MD progress notes. *All is dependent on transitional    Plan:  Will notify [facility] RN who can follow for any known or needs for transitional care needs for returning to post facility care coordination needs to return to community, if applicable, as patient is for full comfort measures, noted.  Will sign off at transition from [facility]  For questions or referrals, please contact:   Natividad Brood, RN BSN [place]  [phone] business mobile phone Toll free office [phone]  *[place]  [phone] Fax number: [phone] [email or web address]

## 2022-11-03 DIAGNOSIS — N179 Acute kidney failure, unspecified: Secondary | ICD-10-CM | POA: Diagnosis not present

## 2022-11-03 DIAGNOSIS — R319 Hematuria, unspecified: Secondary | ICD-10-CM | POA: Diagnosis not present

## 2022-11-03 DIAGNOSIS — R310 Gross hematuria: Secondary | ICD-10-CM | POA: Diagnosis not present

## 2022-11-03 DIAGNOSIS — R410 Disorientation, unspecified: Secondary | ICD-10-CM | POA: Diagnosis not present

## 2022-11-03 DIAGNOSIS — Z7401 Bed confinement status: Secondary | ICD-10-CM | POA: Diagnosis not present

## 2022-11-03 DIAGNOSIS — Z515 Encounter for palliative care: Secondary | ICD-10-CM

## 2022-11-03 DIAGNOSIS — Z743 Need for continuous supervision: Secondary | ICD-10-CM | POA: Diagnosis not present

## 2022-11-03 DIAGNOSIS — R0689 Other abnormalities of breathing: Secondary | ICD-10-CM | POA: Diagnosis not present

## 2022-11-03 LAB — CBC
HCT: 30.4 % — ABNORMAL LOW (ref 39.0–52.0)
Hemoglobin: 9.2 g/dL — ABNORMAL LOW (ref 13.0–17.0)
MCH: 32.6 pg (ref 26.0–34.0)
MCHC: 30.3 g/dL (ref 30.0–36.0)
MCV: 107.8 fL — ABNORMAL HIGH (ref 80.0–100.0)
Platelets: 228 10*3/uL (ref 150–400)
RBC: 2.82 MIL/uL — ABNORMAL LOW (ref 4.22–5.81)
RDW: 15.6 % — ABNORMAL HIGH (ref 11.5–15.5)
WBC: 15.7 10*3/uL — ABNORMAL HIGH (ref 4.0–10.5)
nRBC: 0 % (ref 0.0–0.2)

## 2022-11-03 MED ORDER — MORPHINE BOLUS VIA INFUSION: 5.0000 mg | INTRAVENOUS | Status: DC | PRN

## 2022-11-03 MED ORDER — HALOPERIDOL LACTATE 5 MG/ML IJ SOLN: 1.0000 mg | INTRAMUSCULAR | Status: DC | PRN

## 2022-11-03 MED ORDER — MORPHINE 100MG IN NS 100ML (1MG/ML) PREMIX INFUSION
2.0000 mg/h | INTRAVENOUS | Status: DC
  Administered 2022-11-03: 2 mg/h via INTRAVENOUS
  Filled 2022-11-03: qty 100

## 2022-11-03 MED ORDER — LORAZEPAM 2 MG/ML IJ SOLN: 1.0000 mg | INTRAMUSCULAR | Status: DC | PRN

## 2022-11-03 NOTE — Progress Notes (Signed)
[facility]) Hospital Liaison Note  Received request from Velva Harman, [facility] for family interest in [facility]. Spoke with patient's daughters, [name] and [name], to confirm interest and explain services. Eligibility confirmed per [facility] MD. Daughters are agreeable to transfer today.   Hospital staff aware.  RN, please call report to [phone] prior to patient leaving the unit. Please send signed and completed DNR with patient at discharge.   Thank you,  Zigmund Gottron RN  [facility] Liaison [phone]

## 2022-11-03 NOTE — Discharge Summary (Signed)
Physician Discharge Summary  Ricardo Garrett:[id number] DOB: 1946/04/06 DOA: 10/29/2022  PCP: [name], MD  Admit date: 10/29/2022 Discharge date: 11/03/2022 Recommendations for Outpatient Follow-up:  Follow up with hospice team upon discharge today   Discharge Dispo: [facility] Discharge Condition: Stable Code Status:   Code Status: DNR Diet recommendation:  Diet Order             Diet Heart Room service appropriate? Yes; Fluid consistency: Thin  Diet effective now                    Brief/Interim Summary: 77 year old white male with a history of alcohol abuse, CHF, CAD, A-fib on Eliquis, COPD, and dementia presenting from SNF for confusion and hematuria.  He was also found to be RSV positive and C. difficile positive.  He had 4 hours of CBI in the ER without resolution.  Urology was consulted and used a different catheter with success. Patient had extensive course, found to be more nonverbal, failure to thrive not improving seen by palliative care subsequently decision was made to change him to inpatient hospice, [facility] bed procured and is being discharged once consent finalized issues Gross hematuria resolved Foley in place RSV infection CDIFF diarrhea managed with rectal vancomycin given inability to take p.o. Alcohol abuse:B12 daily CKD (chronic kidney disease) stage 3B VS ckd iv: creat at 1.9-2.2 Recent closed fracture of greater trochanter of right femur, treated non-operatively Paroxysmal atrial fibrillation Chronic combined systolic and diastolic heart failure  Essential hypertension   Discharge Diagnoses:  Principal Problem:   Gross hematuria Active Problems:   Altered mental status   CKD (chronic kidney disease) stage 3, GFR 30-59 ml/min (HCC)   Closed fracture of greater trochanter of right femur (HCC)   RSV exposure   C. difficile diarrhea   Paroxysmal atrial fibrillation (HCC)   Chronic combined systolic and diastolic heart failure ([place])    Essential (primary) hypertension   Palliative care encounter   Hematuria   AKI (acute kidney injury) ([place])   Goals of care, counseling/discussion   Counseling and coordination of care   High risk medication use   Pain   Dyspnea   Agitation   DNR (do not resuscitate)   End of life care  Consults: Urology, PMT Subjective: Moaning, family at bedside  Discharge Exam: Vitals:   11/02/22 1243 11/02/22 2203  BP: (!) 152/109   Pulse: (!) 106 (!) 102  Resp: 18 17  Temp: 98.3 F (36.8 C) 97.7 F (36.5 C)  SpO2: 91% 93%   General: Pt is lethargic Cardiovascular: RRR, S1/S2 +, no rubs, no gallops Respiratory: CTA bilaterally, no wheezing, no rhonchi Abdominal: Soft, NT, ND, bowel sounds + Extremities: no edema, no cyanosis  Discharge Instructions   Allergies as of 11/03/2022   No Known Allergies      Medication List     STOP taking these medications    acetaminophen 325 MG tablet Commonly known as: TYLENOL   amLODipine 5 MG tablet Commonly known as: NORVASC   apixaban 2.5 MG Tabs tablet Commonly known as: Eliquis   atorvastatin 80 MG tablet Commonly known as: LIPITOR   cephALEXin 500 MG capsule Commonly known as: KEFLEX   clopidogrel 75 MG tablet Commonly known as: PLAVIX   digoxin 0.125 MG tablet Commonly known as: LANOXIN   escitalopram 5 MG tablet Commonly known as: LEXAPRO   folic acid 1 MG tablet Commonly known as: FOLVITE   furosemide 20 MG tablet Commonly  known as: Lasix   LORazepam 1 MG tablet Commonly known as: Ativan   losartan 25 MG tablet Commonly known as: COZAAR   methocarbamol 500 MG tablet Commonly known as: ROBAXIN   metoprolol succinate 50 MG 24 hr tablet Commonly known as: TOPROL-XL   multivitamin Tabs tablet   multivitamin with minerals tablet   Normal Saline Flush 0.9 % Soln   ondansetron 4 MG tablet Commonly known as: ZOFRAN   oxyCODONE 5 MG immediate release tablet Commonly known as: Oxy IR/ROXICODONE    senna-docusate 8.6-50 MG tablet Commonly known as: Senokot-S   Sennosides 8.6 MG Caps   tamsulosin 0.4 MG Caps capsule Commonly known as: FLOMAX   thiamine 100 MG tablet Commonly known as: Vitamin B-1   vancomycin 250 MG capsule Commonly known as: VANCOCIN        Follow-up Information     [name], MD. Schedule an appointment as soon as possible for a visit in 3 days.   Specialty: Internal Medicine Contact information: [place] [address] [phone]         [place]. Schedule an appointment as soon as possible for a visit in 1 week.   Why: for evaluation of blood in urine Contact information: [place] [phone] [place]. Go to .   Specialty: Emergency Medicine Why: As needed, If symptoms worsen Contact information: [address] [id number] mc [place] [phone]               No Known Allergies  The results of significant diagnostics from this hospitalization (including imaging, microbiology, ancillary and laboratory) are listed below for reference.    Microbiology: Recent Results (from the past 240 hour(s))  Urine Culture     Status: None   Collection Time: 10/27/22  8:31 PM   Specimen: Urine, Clean Catch  Result Value Ref Range Status   Specimen Description   Final    URINE, CLEAN CATCH Performed at [facility], [address], [place], [place] [address]    Special Requests   Final    NONE Performed at [facility], [address], [place], [place] [address]    Culture   Final    NO GROWTH Performed at [facility], [place] [address], [place], [place] [address]    Report Status 10/29/2022 FINAL  Final  C Difficile Quick Screen w PCR reflex     Status: Abnormal   Collection Time: 10/27/22  8:35 PM   Specimen: Urine, Clean Catch; Stool  Result Value Ref Range  Status   C Diff antigen POSITIVE (A) NEGATIVE Final   C Diff toxin POSITIVE (A) NEGATIVE Final   C Diff interpretation Toxin producing C. difficile detected.  Final    Comment: CRITICAL RESULT CALLED TO, READ BACK BY AND VERIFIED WITH: MOSTELLER,M. '@0116'$  ON 10/28/22 BY [facility] Performed at [facility], [address], [place], [place] [address]   Resp panel by RT-PCR (RSV, Flu A&B, Covid) Anterior Nasal Swab     Status: Abnormal   Collection Time: 10/27/22  9:00 PM   Specimen: Anterior Nasal Swab  Result Value Ref Range Status   SARS Coronavirus 2 by RT PCR NEGATIVE NEGATIVE Final    Comment: (NOTE) SARS-CoV-2 target nucleic acids are NOT DETECTED.  The SARS-CoV-2 RNA is generally detectable in upper respiratory specimens during the acute phase of infection.  The lowest concentration of SARS-CoV-2 viral copies this assay can detect is 138 copies/mL. A negative result does not preclude SARS-Cov-2 infection and should not be used as the sole basis for treatment or other patient management decisions. A negative result may occur with  improper specimen collection/handling, submission of specimen other than nasopharyngeal swab, presence of viral mutation(s) within the areas targeted by this assay, and inadequate number of viral copies(<138 copies/mL). A negative result must be combined with clinical observations, patient history, and epidemiological information. The expected result is Negative.  Fact Sheet for Patients:  EntrepreneurPulse.com.au  Fact Sheet for Healthcare Providers:  IncredibleEmployment.be  This test is no t yet approved or cleared by the [place] FDA and  has been authorized for detection and/or diagnosis of SARS-CoV-2 by FDA under an Emergency Use Authorization (EUA). This EUA will remain  in effect (meaning this test can be used) for the duration of the COVID-19 declaration under Section 564(b)(1) of the Act,  21 U.S.C.section 360bbb-3(b)(1), unless the authorization is terminated  or revoked sooner.       Influenza A by PCR NEGATIVE NEGATIVE Final   Influenza B by PCR NEGATIVE NEGATIVE Final    Comment: (NOTE) The Xpert Xpress SARS-CoV-2/FLU/RSV plus assay is intended as an aid in the diagnosis of influenza from Nasopharyngeal swab specimens and should not be used as a sole basis for treatment. Nasal washings and aspirates are unacceptable for Xpert Xpress SARS-CoV-2/FLU/RSV testing.  Fact Sheet for Patients: EntrepreneurPulse.com.au  Fact Sheet for Healthcare Providers: IncredibleEmployment.be  This test is not yet approved or cleared by the [place] FDA and has been authorized for detection and/or diagnosis of SARS-CoV-2 by FDA under an Emergency Use Authorization (EUA). This EUA will remain in effect (meaning this test can be used) for the duration of the COVID-19 declaration under Section 564(b)(1) of the Act, 21 U.S.C. section 360bbb-3(b)(1), unless the authorization is terminated or revoked.     Resp Syncytial Virus by PCR POSITIVE (A) NEGATIVE Final    Comment: (NOTE) Fact Sheet for Patients: EntrepreneurPulse.com.au  Fact Sheet for Healthcare Providers: IncredibleEmployment.be  This test is not yet approved or cleared by the [place] FDA and has been authorized for detection and/or diagnosis of SARS-CoV-2 by FDA under an Emergency Use Authorization (EUA). This EUA will remain in effect (meaning this test can be used) for the duration of the COVID-19 declaration under Section 564(b)(1) of the Act, 21 U.S.C. section 360bbb-3(b)(1), unless the authorization is terminated or revoked.  Performed at [facility], [address], [place], [place] [address]   Urine Culture     Status: None   Collection Time: 10/29/22  4:12 PM   Specimen: Urine, Clean Catch  Result Value Ref Range Status    Specimen Description   Final    URINE, CLEAN CATCH Performed at [facility], [address] [place], [place] [address]    Special Requests   Final    NONE Performed at [facility], [address], [place], [place] [address]    Culture   Final    NO GROWTH Performed at [facility], [place] [address], [place], [place] [address]    Report Status 10/31/2022 FINAL  Final    Procedures/Studies: CT Head Wo Contrast  Result Date: 10/27/2022 CLINICAL DATA:  Mental status change, unknown cause EXAM: CT HEAD WITHOUT CONTRAST TECHNIQUE: Contiguous axial images were obtained from the base of the skull through the vertex without intravenous contrast. RADIATION DOSE REDUCTION: This exam  was performed according to the departmental dose-optimization program which includes automated exposure control, adjustment of the mA and/or kV according to patient size and/or use of iterative reconstruction technique. COMPARISON:  10/12/2022 FINDINGS: Brain: No evidence of acute infarction, hemorrhage, hydrocephalus or, extra-axial collection. Stable calcified lesion in the fourth ventricle measuring up to 1.8 cm. Chronic lacunar infarcts in the bilateral basal ganglia. Patchy low-density changes within the periventricular and subcortical white matter compatible with chronic microvascular ischemic change. Moderate diffuse cerebral volume loss. Vascular: Atherosclerotic calcifications involving the large vessels of the skull base. No unexpected hyperdense vessel. Skull: Normal. Negative for fracture or focal lesion. Sinuses/Orbits: Mild diffuse paranasal sinus mucosal thickening with air-fluid levels in the sphenoid sinuses. Partial bilateral mastoid effusions. Other: None. IMPRESSION: 1. No acute intracranial abnormality. 2. 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 No hydrocephalus. 3. Chronic microvascular ischemic change and cerebral volume loss. 4. Paranasal sinus disease with air-fluid levels in the  sphenoid sinuses. Correlate for acute sinusitis. 5. Partial bilateral mastoid effusions. Electronically Signed   By: Davina Poke D.O.   On: 10/27/2022 22:00   CUP PACEART REMOTE DEVICE CHECK  Result Date: 10/25/2022 Scheduled remote reviewed. Normal device function.  [place]- Eliquis Next remote 91 days- JJB  CT HEAD WO CONTRAST (5MM)  Result Date: 10/12/2022 CLINICAL DATA:  Altered level of consciousness, agitation, confusion EXAM: CT HEAD WITHOUT CONTRAST TECHNIQUE: Contiguous axial images were obtained from the base of the skull through the vertex without intravenous contrast. RADIATION DOSE REDUCTION: This exam was performed according to the departmental dose-optimization program which includes automated exposure control, adjustment of the mA and/or kV according to patient size and/or use of iterative reconstruction technique. COMPARISON:  10/10/2022 FINDINGS: Brain: Chronic small vessel ischemic changes are again seen within the periventricular white matter and basal ganglia, stable. No signs of acute infarct or hemorrhage. Stable calcified structure within the fourth ventricle, with no evidence of hydrocephalus. The lateral ventricles and remaining midline structures are stable. No acute extra-axial fluid collections. No mass effect. Vascular: No hyperdense vessel or unexpected calcification. Skull: Normal. Negative for fracture or focal lesion. Sinuses/Orbits: No acute finding. Other: None. IMPRESSION: 1. Stable exam, no acute intracranial process. 2. 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 Electronically Signed   By: Randa Ngo M.D.   On: 10/12/2022 15:22   DG CHEST PORT 1 VIEW  Result Date: 10/12/2022 CLINICAL DATA:  Shortness of breath. EXAM: PORTABLE CHEST 1 VIEW COMPARISON:  09/09/2022 and CT chest 07/27/2022. FINDINGS: Trachea is midline. Heart size is accentuated by AP technique. Pacemaker lead tips are in the right atrium and right  ventricle. Mild diffuse interstitial prominence and indistinctness. Probable small right pleural effusion. IMPRESSION: Mild congestive heart failure. Electronically Signed   By: Lorin Picket M.D.   On: 10/12/2022 14:12   CT HEAD WO CONTRAST (5MM)  Result Date: 10/10/2022 CLINICAL DATA:  Altered mental status EXAM: CT HEAD WITHOUT CONTRAST TECHNIQUE: Contiguous axial images were obtained from the base of the skull through the vertex without intravenous contrast. RADIATION DOSE REDUCTION: This exam was performed according to the departmental dose-optimization program which includes automated exposure control, adjustment of the mA and/or kV according to patient size and/or use of iterative reconstruction technique. COMPARISON:  CT Head 09/09/22 FINDINGS: Brain: Redemonstrated sequela of severe chronic microvascular ischemic change with advanced generalized volume loss. 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 No evidence of acute infarction, hemorrhage, hydrocephalus, extra-axial collection.  Unchanged calcified mass of the floor of the fourth ventricle. Vascular: No hyperdense vessel or unexpected calcification. Skull: Normal. Negative for fracture or focal lesion. Sinuses/Orbits: No acute finding. Other: None. IMPRESSION: 1. No acute intracranial abnormality. 2. Redemonstrated sequela of severe chronic microvascular ischemic change with advanced generalized volume loss and multiple chronic infarcts. 3. Unchanged calcified mass of the floor of the fourth ventricle. No hydrocephalus. Electronically Signed   By: Marin Roberts M.D.   On: 10/10/2022 13:03   DG Wrist 2 Views Right  Result Date: 10/09/2022 CLINICAL DATA:  Right wrist pain. EXAM: RIGHT WRIST - 2 VIEW COMPARISON:  None Available. FINDINGS: There is no evidence of fracture or dislocation. Mild ulna minus variance. Degenerative change of the thumb carpal metacarpal joint. Suspect remote fracture of the third proximal  metacarpal. Subchondral cystic change in the distal radius and ulna. Prominent vascular calcifications. Mild soft tissue edema. IMPRESSION: 1. No acute osseous abnormality. 2. Mild ulna minus variance and degenerative change of the radiocarpal and thumb carpometacarpal joint. Electronically Signed   By: Keith Rake M.D.   On: 10/09/2022 20:26   DG Shoulder 1V Right  Result Date: 10/09/2022 CLINICAL DATA:  Fall at home with right shoulder pain EXAM: RIGHT SHOULDER - 1 VIEW COMPARISON:  Chest radiograph dated 09/09/2022 FINDINGS: There is no evidence of fracture or dislocation. There is no evidence of arthropathy or other focal bone abnormality. Soft tissues are unremarkable. IMPRESSION: No acute fracture or dislocation. Electronically Signed   By: Darrin Nipper M.D.   On: 10/09/2022 11:37   DG Elbow 2 Views Right  Result Date: 10/09/2022 CLINICAL DATA:  Fall at home with right elbow pain EXAM: RIGHT ELBOW - 2 VIEW COMPARISON:  Right elbow radiographs dated 10/07/2022 FINDINGS: There is no evidence of fracture, dislocation, or joint effusion. There is no evidence of arthropathy or other focal bone abnormality. Soft tissues are unremarkable. IMPRESSION: No acute fracture or dislocation. Electronically Signed   By: Darrin Nipper M.D.   On: 10/09/2022 11:36   CT Hip Right Wo Contrast  Result Date: 10/07/2022 CLINICAL DATA:  Fall, right hip fracture. EXAM: CT OF THE RIGHT HIP WITHOUT CONTRAST TECHNIQUE: Multidetector CT imaging of the right hip was performed according to the standard protocol. Multiplanar CT image reconstructions were also generated. RADIATION DOSE REDUCTION: This exam was performed according to the departmental dose-optimization program which includes automated exposure control, adjustment of the mA and/or kV according to patient size and/or use of iterative reconstruction technique. COMPARISON:  Pelvic radiograph 10/07/2022 FINDINGS: Bones/Joint/Cartilage There is deformity of the right  proximal femur with a right hip screw in place, single proximal cerclage and 4 interlocking screws along the lateral plate component. New/acute mildly comminuted fracture the greater trochanter encompassing the attachment sites of the gluteus medius and minimus. The fracture plane extends down to the upper margin of the plate but does not appear to extend further along the prostatic or traverse in a definite intertrochanteric manner. No other regional fracture observed. Ligaments Suboptimally assessed by CT. Muscles and Tendons Edema tracks along the atrophic right iliopsoas tendon and distal muscle. Low-level edema along the distal gluteus medius and minimus. Soft tissues Prominent median lobe of the prostate gland indents the bladder base. A direct right inguinal hernia contains adipose tissue and a loop of small bowel without complicating feature. Extensive atherosclerotic vascular disease noted. IMPRESSION: 1. Acute mildly comminuted fracture of the right greater trochanter encompassing the attachment sites of the gluteus medius and minimus. The fracture  plane extends down to the upper margin of the plate but does not appear to extend further along the prostatic or extend in a definite intertrochanteric manner. 2. Edema tracks along the atrophic right iliopsoas tendon and distal muscle. 3. Direct right inguinal hernia contains adipose tissue and a loop of small bowel without complicating feature. 4. Prominent median lobe of the prostate gland indents the bladder base. 5. Extensive atherosclerotic vascular disease. Electronically Signed   By: Van Clines M.D.   On: 10/07/2022 18:53   DG Elbow Complete Right  Result Date: 10/07/2022 CLINICAL DATA:  Right elbow pain after fall. EXAM: RIGHT ELBOW - COMPLETE 3+ VIEW COMPARISON:  None Available. FINDINGS: There is no evidence of fracture, dislocation, or joint effusion. There is no evidence of arthropathy or other focal bone abnormality. Soft tissues are  unremarkable. IMPRESSION: Negative. Electronically Signed   By: Marijo Conception M.D.   On: 10/07/2022 15:55   DG Pelvis 1-2 Views  Result Date: 10/07/2022 CLINICAL DATA:  Fall. EXAM: PELVIS - 1-2 VIEW COMPARISON:  June 17, 2021. FINDINGS: Status post surgical internal fixation of old proximal right femoral fracture. No acute fracture or dislocation is noted. IMPRESSION: No acute abnormality seen. Electronically Signed   By: Marijo Conception M.D.   On: 10/07/2022 15:54    Labs: BNP (last 3 results) Recent Labs    09/09/22 1323  BNP 144.3*   Basic Metabolic Panel: Recent Labs  Lab 10/27/22 2110 10/29/22 1457 10/30/22 0613 10/31/22 0641 11/01/22 0453  NA 139 141 139 142 144  K 3.9 4.3 4.2 4.2 4.4  CL 114* 117* 116* 117* 119*  CO2 20* 21* 17* 16* 18*  GLUCOSE 99 103* 85 91 96  BUN 23 24* 26* 31* 34*  CREATININE 1.94* 1.89* 1.96* 2.26* 2.24*  CALCIUM 7.6* 8.2* 7.9* 8.2* 8.2*  MG 1.7  --   --   --   --    Liver Function Tests: Recent Labs  Lab 10/27/22 2110  AST 41  ALT 29  ALKPHOS 117  BILITOT 0.5  PROT 5.8*  ALBUMIN 2.8*   No results for input(s): "LIPASE", "AMYLASE" in the last 168 hours. Recent Labs  Lab 10/27/22 2110  AMMONIA <10   CBC: Recent Labs  Lab 10/27/22 2110 10/29/22 1457 10/30/22 0613 10/31/22 0641 11/01/22 0453 11/02/22 0532 11/03/22 0455  WBC 6.3   < > 13.7* 14.7* 14.6* 16.5* 15.7*  NEUTROABS 4.7  --   --   --   --   --   --   HGB 9.4*   < > 8.7* 8.6* 8.4* 9.1* 9.2*  HCT 29.4*   < > 27.6* 28.1* 27.0* 29.8* 30.4*  MCV 102.4*   < > 104.9* 105.2* 105.9* 107.6* 107.8*  PLT 319   < > 298 269 242 259 228   < > = values in this interval not displayed.   Cardiac Enzymes: No results for input(s): "CKTOTAL", "CKMB", "CKMBINDEX", "TROPONINI" in the last 168 hours. BNP: Invalid input(s): "POCBNP" CBG: No results for input(s): "GLUCAP" in the last 168 hours. D-Dimer No results for input(s): "DDIMER" in the last 72 hours. Hgb A1c No results for  input(s): "HGBA1C" in the last 72 hours. Lipid Profile No results for input(s): "CHOL", "HDL", "LDLCALC", "TRIG", "CHOLHDL", "LDLDIRECT" in the last 72 hours. Thyroid function studies No results for input(s): "TSH", "T4TOTAL", "T3FREE", "THYROIDAB" in the last 72 hours.  Invalid input(s): "FREET3" Anemia work up No results for input(s): "VITAMINB12", "FOLATE", "FERRITIN", "TIBC", "IRON", "  RETICCTPCT" in the last 72 hours. Urinalysis    Component Value Date/Time   COLORURINE RED (A) 10/29/2022 1518   APPEARANCEUR TURBID (A) 10/29/2022 1518   LABSPEC  10/29/2022 1518    TEST NOT REPORTED DUE TO COLOR INTERFERENCE OF URINE PIGMENT   PHURINE  10/29/2022 1518    TEST NOT REPORTED DUE TO COLOR INTERFERENCE OF URINE PIGMENT   GLUCOSEU (A) 10/29/2022 1518    TEST NOT REPORTED DUE TO COLOR INTERFERENCE OF URINE PIGMENT   HGBUR (A) 10/29/2022 1518    TEST NOT REPORTED DUE TO COLOR INTERFERENCE OF URINE PIGMENT   BILIRUBINUR (A) 10/29/2022 1518    TEST NOT REPORTED DUE TO COLOR INTERFERENCE OF URINE PIGMENT   KETONESUR (A) 10/29/2022 1518    TEST NOT REPORTED DUE TO COLOR INTERFERENCE OF URINE PIGMENT   PROTEINUR (A) 10/29/2022 1518    TEST NOT REPORTED DUE TO COLOR INTERFERENCE OF URINE PIGMENT   UROBILINOGEN 0.2 03/14/2013 1552   NITRITE (A) 10/29/2022 1518    TEST NOT REPORTED DUE TO COLOR INTERFERENCE OF URINE PIGMENT   LEUKOCYTESUR (A) 10/29/2022 1518    TEST NOT REPORTED DUE TO COLOR INTERFERENCE OF URINE PIGMENT   Sepsis Labs Recent Labs  Lab 10/31/22 0641 11/01/22 0453 11/02/22 0532 11/03/22 0455  WBC 14.7* 14.6* 16.5* 15.7*   Microbiology Recent Results (from the past 240 hour(s))  Urine Culture     Status: None   Collection Time: 10/27/22  8:31 PM   Specimen: Urine, Clean Catch  Result Value Ref Range Status   Specimen Description   Final    URINE, CLEAN CATCH Performed at [facility], [address] [place], [place] [address]    Special Requests   Final     NONE Performed at [facility], [address], [place], [place] [address]    Culture   Final    NO GROWTH Performed at [facility], [place] [address], [place], [place] [address]    Report Status 10/29/2022 FINAL  Final  C Difficile Quick Screen w PCR reflex     Status: Abnormal   Collection Time: 10/27/22  8:35 PM   Specimen: Urine, Clean Catch; Stool  Result Value Ref Range Status   C Diff antigen POSITIVE (A) NEGATIVE Final   C Diff toxin POSITIVE (A) NEGATIVE Final   C Diff interpretation Toxin producing C. difficile detected.  Final    Comment: CRITICAL RESULT CALLED TO, READ BACK BY AND VERIFIED WITH: MOSTELLER,M. '@0116'$  ON 10/28/22 BY [facility] Performed at [facility], [address] [place],  [address]   Resp panel by RT-PCR (RSV, Flu A&B, Covid) Anterior Nasal Swab     Status: Abnormal   Collection Time: 10/27/22  9:00 PM   Specimen: Anterior Nasal Swab  Result Value Ref Range Status   SARS Coronavirus 2 by RT PCR NEGATIVE NEGATIVE Final    Comment: (NOTE) SARS-CoV-2 target nucleic acids are NOT DETECTED.  The SARS-CoV-2 RNA is generally detectable in upper respiratory specimens during the acute phase of infection. The lowest concentration of SARS-CoV-2 viral copies this assay can detect is 138 copies/mL. A negative result does not preclude SARS-Cov-2 infection and should not be used as the sole basis for treatment or other patient management decisions. A negative result may occur with  improper specimen collection/handling, submission of specimen other than nasopharyngeal swab, presence of viral mutation(s) within the areas targeted by this assay, and inadequate number of viral copies(<138 copies/mL). A negative result must be  combined with clinical observations, patient history, and epidemiological information. The expected result is Negative.  Fact Sheet for Patients:  EntrepreneurPulse.com.au  Fact Sheet for Healthcare Providers:   IncredibleEmployment.be  This test is no t yet approved or cleared by the [place] FDA and  has been authorized for detection and/or diagnosis of SARS-CoV-2 by FDA under an Emergency Use Authorization (EUA). This EUA will remain  in effect (meaning this test can be used) for the duration of the COVID-19 declaration under Section 564(b)(1) of the Act, 21 U.S.C.section 360bbb-3(b)(1), unless the authorization is terminated  or revoked sooner.       Influenza A by PCR NEGATIVE NEGATIVE Final   Influenza B by PCR NEGATIVE NEGATIVE Final    Comment: (NOTE) The Xpert Xpress SARS-CoV-2/FLU/RSV plus assay is intended as an aid in the diagnosis of influenza from Nasopharyngeal swab specimens and should not be used as a sole basis for treatment. Nasal washings and aspirates are unacceptable for Xpert Xpress SARS-CoV-2/FLU/RSV testing.  Fact Sheet for Patients: EntrepreneurPulse.com.au  Fact Sheet for Healthcare Providers: IncredibleEmployment.be  This test is not yet approved or cleared by the [place] FDA and has been authorized for detection and/or diagnosis of SARS-CoV-2 by FDA under an Emergency Use Authorization (EUA). This EUA will remain in effect (meaning this test can be used) for the duration of the COVID-19 declaration under Section 564(b)(1) of the Act, 21 U.S.C. section 360bbb-3(b)(1), unless the authorization is terminated or revoked.     Resp Syncytial Virus by PCR POSITIVE (A) NEGATIVE Final    Comment: (NOTE) Fact Sheet for Patients: EntrepreneurPulse.com.au  Fact Sheet for Healthcare Providers: IncredibleEmployment.be  This test is not yet approved or cleared by the [place] FDA and has been authorized for detection and/or diagnosis of SARS-CoV-2 by FDA under an Emergency Use Authorization (EUA). This EUA will remain in effect (meaning this test can be used)  for the duration of the COVID-19 declaration under Section 564(b)(1) of the Act, 21 U.S.C. section 360bbb-3(b)(1), unless the authorization is terminated or revoked.  Performed at [facility], [address] [place], [place] [address]   Urine Culture     Status: None   Collection Time: 10/29/22  4:12 PM   Specimen: Urine, Clean Catch  Result Value Ref Range Status   Specimen Description   Final    URINE, CLEAN CATCH Performed at [facility], [address] [place], [place] [address]    Special Requests   Final    NONE Performed at [facility], [address], [place], [place] [address]    Culture   Final    NO GROWTH Performed at [facility], [place] [address], [place], [place] [address]    Report Status 10/31/2022 FINAL  Final     Time coordinating discharge: 25 minutes  SIGNED: Antonieta Pert, MD  [facility] 11/03/2022, 11:52 AM  If 7PM-7AM, please contact night-coverage [email or web address]

## 2022-11-03 NOTE — Progress Notes (Signed)
Daily Progress Note   Patient Name: RULON ABDALLA       Date: 11/03/2022 DOB: 10-08-1946  Age: 77 y.o. MRN#: [id number] Attending Physician: Antonieta Pert, MD Primary Care Physician: [name], MD Admit Date: 10/29/2022 Length of Stay: 4 days  Reason for Consultation/Follow-up: Establishing goals of care and Symptom Management   Subjective:   CC: Patient seen moaning and agitated in bed.  Spoke with patient's daughter, [name], at bedside and sister over the phone. Following up regarding end-of-life management.   Subjective:  Reviewed EMR prior to seeing patient.  Over the past 24 hours patient's symptom burden has greatly increased and he is required 31 mg of IV morphine for symptom management.  Presented to bedside.  Patient seen laying in bed moaning.  Patient appears to be in pain and agitated.  Spoke with daughter [name] at bedside.  Sure was able to call her sister and put her on the phone so I was able to update her as well.  Due to patient's increasing symptom burden, recommending to start continuous morphine infusion.  Because of this, would recommend transitioning care to an inpatient hospice facility.  Daughter voiced wanting to use [facility] to stay here in [place] so patient's wife could visit. All questions answered at that time.  Provided emotional support as able.  Updated care team after visit.  Review of Systems  Objective:   Vital Signs:  BP (!) 152/109 (BP Location: Left Arm)   Pulse (!) 102   Temp 97.7 F (36.5 C) (Axillary)   Resp 17   Ht '5\' 8"'$  (1.727 m)   Wt 56 kg   SpO2 93%   BMI 18.77 kg/m   Physical Exam: General: confused, chronically ill-appearing, frail, agitated, moaning  Eyes: No discharge noted HENT: Dry mucous membranes, grimacing Cardiovascular: RRR Respiratory: Slightly increased work of breathing noted Abdomen: not distended Extremities: muscle wasting present in all extremities, moving all extremities spontaneously Skin: no  rashes or lesions on visible skin Neuro: confused, moaning   Imaging:  I personally reviewed recent imaging.   Assessment & Plan:   Assessment: Patient is a 77 year old male with a past medical history of alcohol abuse, CHF, CAD, A-fib on Eliquis, COPD, and dementia who was admitted from SNF on 10/29/2022 for management of confusion and hematuria.  During admission patient was found to be RSV positive and C. difficile positive.  Urology has been going to assist with management of hematuria.  Patient has had continued issues during hospitalization with altered mental status.  Medicine team consulted to assist with complex medical decision making. Of note, patient seen by palliative medicine team in December 2023.   # Complex medical decision making/goals of care  -Patient unable to participate in medical decision making secondary to his mental status.  -Due to patient's symptom burden, discussed with daughter starting continuous medication infusion for symptom management.  Daughters agreeing with this plan.  With patient's increasing symptom burden need, discussed possible transfer to inpatient hospice facility.  Daughter is requesting patient remain in [place] at [facility] so that patient's wife would be able to visit.  Noted would place [facility] referral to assist with this.  -[name] stated that she would want her daughters, [name] and [name], to make medical decisions on behalf of the patient since he is unable to participate in complex medical decision making.  Wife defers her decision making capabilities for the patient to them.  - Code Status: DNR   # Symptom management    -  Pain/Dyspnea, acute in the setting of end-of-life care                               Symptoms currently not controlled.  Patient required 31 mg of IV morphine in the past 24 hours.  Patient seen moaning and grimacing this morning.   -Start continual basal dose of IV morphine at 2 mg/h.  Increase IV morphine  bolus to 5 mg every 30 minutes as needed.                  -Anxiety/agitation, in the setting of end-of-life care                               -Continue IV Ativan 1 mg every 4 hours as needed. Continue to adjust based on patient's symptom burden.  Asked RN to provide at time of visit due to agitation.                               -Increase IV Haldol to 1 mg every 4 hours as needed. Continue to adjust based on patient's symptom burden.                   -Secretions, in the setting of end-of-life care                               -Continue IV glycopyrrolate as needed.  # Psychosocial Support:  -Wife, 2 daughters  # Discharge Planning: Continuing with comfort care management.  Due to patient's increased symptom burden, going to start continuous morphine infusion.  Placed TOC referral to assist with evaluation for inpatient hospice placement at [facility] (as per family's request).  Discussed with: Hospitalist, TOC, bedside RN, daughters  Thank you for allowing the palliative care team to participate in the care [name].  Chelsea Aus, DO Palliative Care Provider PMT # [phone]  If patient remains symptomatic despite maximum doses, please call PMT at [phone] between 0700 and 1900. Outside of these hours, please call attending, as PMT does not have night coverage.

## 2022-11-03 NOTE — TOC Transition Note (Signed)
Transition of Care [facility]) - CM/SW Discharge Note   Patient Details  Name: Ricardo Garrett MRN: [id number] Date of Birth: June 09, 1946  [facility]) CM/SW Contact:  Leeroy Cha, RN Phone Number: 11/03/2022, 12:46 PM   Clinical Narrative:    [facility]tar called for transport at 1240.  Patient dcd to [facility].   Final next level of care: [place] Barriers to Discharge: Continued Medical Work up   Patient Goals and CMS Choice      Discharge Placement                         Discharge Plan and Services Additional resources added to the After Visit Summary for                                       Social Determinants of Health (SDOH) Interventions SDOH Screenings   Food Insecurity: No Food Insecurity (10/30/2022)  Housing: Low Risk  (10/30/2022)  Transportation Needs: No Transportation Needs (10/30/2022)  Utilities: Not At Risk (10/30/2022)  Alcohol Screen: Low Risk  (01/14/2020)  Depression (PHQ2-9): Low Risk  (01/14/2020)  Financial Resource Strain: Low Risk  (01/14/2020)  Physical Activity: Inactive (01/14/2020)  Social Connections: Moderately Isolated (01/14/2020)  Stress: No Stress Concern Present (01/14/2020)  Tobacco Use: Medium Risk (10/30/2022)     Readmission Risk Interventions   Row Labels 10/31/2022   12:42 PM 10/11/2022    2:59 PM  Readmission Risk Prevention Plan   Section Header. No data exists in this row.    Transportation Screening   Complete Complete  PCP or Specialist Appt within 3-5 Days   Complete Not Complete  HRI or [place]   Complete Complete  Social Work Consult for [place] Planning/Counseling   Complete Complete  Palliative Care Screening   Complete Not Applicable  Medication Review Press photographer)   Complete Complete

## 2022-11-03 NOTE — TOC Progression Note (Signed)
Transition of Care [facility]) - Progression Note    Patient Details  Name: Ricardo Garrett MRN: [id number] Date of Birth: Apr 09, 1946  Transition of Care [facility]) CM/SW Contact  Leeroy Cha, RN Phone Number: 11/03/2022, 9:55 AM  Clinical Narrative:    Nicholaus Corolla with [facility] contacted about transferring patient to [facility].   Expected Discharge Plan: [place] Barriers to Discharge: Continued Medical Work up  Expected Discharge Plan and Services                                               Social Determinants of Health (SDOH) Interventions SDOH Screenings   Food Insecurity: No Food Insecurity (10/30/2022)  Housing: Low Risk  (10/30/2022)  Transportation Needs: No Transportation Needs (10/30/2022)  Utilities: Not At Risk (10/30/2022)  Alcohol Screen: Low Risk  (01/14/2020)  Depression (PHQ2-9): Low Risk  (01/14/2020)  Financial Resource Strain: Low Risk  (01/14/2020)  Physical Activity: Inactive (01/14/2020)  Social Connections: Moderately Isolated (01/14/2020)  Stress: No Stress Concern Present (01/14/2020)  Tobacco Use: Medium Risk (10/30/2022)    Readmission Risk Interventions   Row Labels 10/31/2022   12:42 PM 10/11/2022    2:59 PM  Readmission Risk Prevention Plan   Section Header. No data exists in this row.    Transportation Screening   Complete Complete  PCP or Specialist Appt within 3-5 Days   Complete Not Complete  HRI or [place]   Complete Complete  Social Work Consult for [place] Planning/Counseling   Complete Complete  Palliative Care Screening   Complete Not Applicable  Medication Review Press photographer)   Complete Complete

## 2022-11-10 NOTE — Progress Notes (Signed)
Remote pacemaker transmission.   

## 2022-11-16 ENCOUNTER — Ambulatory Visit: Payer: Medicare HMO | Admitting: Internal Medicine

## 2022-12-01 DEATH — deceased

## 2023-01-31 ENCOUNTER — Other Ambulatory Visit: Payer: Medicare HMO

## 2023-02-07 ENCOUNTER — Ambulatory Visit: Payer: Medicare HMO | Admitting: Hematology
